# Patient Record
Sex: Male | Born: 1975 | Race: Black or African American | Smoking: Current every day smoker
Health system: Northeastern US, Academic
[De-identification: ages and names within clinical notes are randomized; demographics above are authoritative.]

## PROBLEM LIST (undated history)

## (undated) DIAGNOSIS — K219 Gastro-esophageal reflux disease without esophagitis: Secondary | ICD-10-CM

## (undated) DIAGNOSIS — F32A Depression, unspecified: Secondary | ICD-10-CM

## (undated) DIAGNOSIS — B20 Human immunodeficiency virus [HIV] disease: Secondary | ICD-10-CM

## (undated) DIAGNOSIS — J45909 Unspecified asthma, uncomplicated: Secondary | ICD-10-CM

## (undated) DIAGNOSIS — K257 Chronic gastric ulcer without hemorrhage or perforation: Secondary | ICD-10-CM

## (undated) DIAGNOSIS — H547 Unspecified visual loss: Secondary | ICD-10-CM

## (undated) DIAGNOSIS — Z21 Asymptomatic human immunodeficiency virus [HIV] infection status: Secondary | ICD-10-CM

## (undated) DIAGNOSIS — F1010 Alcohol abuse, uncomplicated: Secondary | ICD-10-CM

## (undated) DIAGNOSIS — L309 Dermatitis, unspecified: Secondary | ICD-10-CM

## (undated) DIAGNOSIS — F419 Anxiety disorder, unspecified: Secondary | ICD-10-CM

## (undated) DIAGNOSIS — G709 Myoneural disorder, unspecified: Secondary | ICD-10-CM

## (undated) HISTORY — DX: Human immunodeficiency virus (HIV) disease: B20

## (undated) HISTORY — DX: Unspecified visual loss: H54.7

## (undated) HISTORY — DX: Depression, unspecified: F32.A

## (undated) HISTORY — DX: Anxiety disorder, unspecified: F41.9

## (undated) HISTORY — PX: FRACTURE SURGERY: SHX138

## (undated) HISTORY — DX: Myoneural disorder, unspecified: G70.9

## (undated) HISTORY — DX: Chronic gastric ulcer without hemorrhage or perforation: K25.7

## (undated) HISTORY — DX: Asymptomatic human immunodeficiency virus (hiv) infection status: Z21

## (undated) HISTORY — PX: OTHER SURGICAL HISTORY: SHX169

---

## 2009-07-26 ENCOUNTER — Ambulatory Visit
Admit: 2009-07-26 | Discharge: 2009-07-26 | Disposition: A | Discharge status: Home / Self Care | Payer: Self-pay | Source: Ambulatory Visit | Attending: Psychiatry | Admitting: Psychiatry

## 2010-03-29 ENCOUNTER — Ambulatory Visit
Admit: 2010-03-29 | Discharge: 2010-03-29 | Disposition: A | Discharge status: Home / Self Care | Payer: Self-pay | Source: Ambulatory Visit | Attending: Internal Medicine | Admitting: Internal Medicine

## 2010-03-29 LAB — CBC
Hematocrit: 46 % (ref 40–51)
Hemoglobin: 15.7 g/dL (ref 13.7–17.5)
MCV: 96 fL — ABNORMAL HIGH (ref 79–92)
Platelets: 201 THOU/uL (ref 150–330)
RBC: 4.8 MIL/uL (ref 4.6–6.1)
RDW: 12.7 % (ref 11.6–14.4)
WBC: 6.3 THOU/uL (ref 4.2–9.1)

## 2010-03-29 LAB — COMPREHENSIVE METABOLIC PANEL
ALT: 74 U/L — ABNORMAL HIGH (ref 0–50)
AST: 104 U/L — ABNORMAL HIGH (ref 0–50)
Albumin: 4.9 g/dL (ref 3.5–5.2)
Alk Phos: 80 U/L (ref 40–130)
Anion Gap: 15 (ref 7–16)
Bilirubin,Total: 0.6 mg/dL (ref 0.0–1.2)
CO2: 24 mmol/L (ref 20–28)
Calcium: 8.8 mg/dL — ABNORMAL LOW (ref 9.0–10.3)
Chloride: 103 mmol/L (ref 96–108)
Creatinine: 0.83 mg/dL (ref 0.67–1.17)
GFR,Black: >59 *
GFR,Caucasian: >59 *
Glucose: 69 mg/dL — ABNORMAL LOW (ref 74–106)
Lab: 6 mg/dL (ref 6–20)
Potassium: 3.9 mmol/L (ref 3.3–5.1)
Sodium: 142 mmol/L (ref 133–145)
Total Protein: 7.4 g/dL (ref 6.3–7.7)

## 2010-03-29 LAB — TSH: TSH: 0.97 u[IU]/mL (ref 0.27–4.20)

## 2010-03-30 LAB — CHLAMYDIA PLASMID DNA AMPLIFICATION: Chlamydia Plasmid DNA Amplification: 0

## 2010-03-30 LAB — HEB B INT

## 2010-03-30 LAB — HEPATITIS B CORE ANTIBODY, TOTAL: HBV Core Ab: NEGATIVE

## 2010-03-30 LAB — HEPATITIS B SURFACE ANTIBODY
HBV S Ab Quant: 0 IU/L
HBV S Ab: NEGATIVE

## 2010-03-30 LAB — RPR: RPR Screen: NONREACTIVE

## 2010-03-30 LAB — DRUG SCREEN CHEMICAL DEPENDENCY, URINE
Amphetamine,UR: NEGATIVE
Benzodiazepinen,UR: NEGATIVE
Cocaine/Metab,UR: NEGATIVE
Opiates,UR: NEGATIVE
THC Metabolite,UR: NEGATIVE

## 2010-03-30 LAB — HEPATITIS B SURFACE ANTIGEN: HBV S Ag: NEGATIVE

## 2010-03-30 LAB — TRICHOMONAS CULTURE

## 2010-03-30 LAB — N. GONORRHOEAE DNA AMPLIFICATION: N. gonorrhoeae DNA Amplification: 0

## 2010-03-30 LAB — HEPATITIS C ANTIBODY: Hep C Ab: NEGATIVE

## 2010-04-02 LAB — VITAMIN D
25-OH VIT D2: <4 ng/mL
25-OH VIT D3: 19 ng/mL
25-OH Vit Total: 19 ng/mL — ABNORMAL LOW (ref 30–80)

## 2011-06-07 ENCOUNTER — Ambulatory Visit
Admit: 2011-06-07 | Discharge: 2011-06-07 | Disposition: A | Discharge status: Home / Self Care | Payer: Self-pay | Source: Ambulatory Visit | Attending: Internal Medicine | Admitting: Internal Medicine

## 2011-06-10 LAB — N. GONORRHOEAE DNA AMPLIFICATION: N. gonorrhoeae DNA Amplification: 0

## 2011-06-10 LAB — CHLAMYDIA PLASMID DNA AMPLIFICATION: Chlamydia Plasmid DNA Amplification: 0

## 2011-08-26 ENCOUNTER — Encounter: Payer: Self-pay | Admitting: Physician Assistant

## 2011-08-26 ENCOUNTER — Inpatient Hospital Stay
Admission: EM | Admit: 2011-08-26 | Disposition: A | Discharge status: Home / Self Care | Payer: Self-pay | Source: Ambulatory Visit | Attending: Internal Medicine | Admitting: Internal Medicine

## 2011-08-26 HISTORY — DX: Dermatitis, unspecified: L30.9

## 2011-08-26 HISTORY — DX: Gastro-esophageal reflux disease without esophagitis: K21.9

## 2011-08-26 HISTORY — DX: Alcohol abuse, uncomplicated: F10.10

## 2011-08-26 HISTORY — DX: Unspecified asthma, uncomplicated: J45.909

## 2011-08-26 LAB — RUQ PANEL (ED ONLY)
ALT: 1023 U/L — ABNORMAL HIGH (ref 0–50)
AST: 1653 U/L — ABNORMAL HIGH (ref 0–50)
Albumin: 4.9 g/dL (ref 3.5–5.2)
Alk Phos: 81 U/L (ref 40–130)
Amylase: 87 U/L (ref 28–100)
Bilirubin,Direct: 0.5 mg/dL — ABNORMAL HIGH (ref 0.0–0.3)
Bilirubin,Total: 0.9 mg/dL (ref 0.0–1.2)
Lipase: 52 U/L (ref 13–60)
Total Protein: 8.1 g/dL — ABNORMAL HIGH (ref 6.3–7.7)

## 2011-08-26 LAB — ACETAMINOPHEN LEVEL: Acetaminophen: <2 ug/mL

## 2011-08-26 LAB — CBC AND DIFFERENTIAL
Baso # K/uL: 0 10*3/uL (ref 0.0–0.1)
Basophil %: 0.6 % (ref 0.2–1.2)
Eos # K/uL: 0 10*3/uL (ref 0.0–0.5)
Eosinophil %: 0 % — ABNORMAL LOW (ref 0.8–7.0)
Hematocrit: 51 % (ref 40–51)
Hemoglobin: 18.1 g/dL — ABNORMAL HIGH (ref 13.7–17.5)
Lymph # K/uL: 0.6 10*3/uL — ABNORMAL LOW (ref 1.3–3.6)
Lymphocyte %: 16.6 % — ABNORMAL LOW (ref 21.8–53.1)
MCV: 90 fL (ref 79–92)
Mono # K/uL: 0.1 10*3/uL — ABNORMAL LOW (ref 0.3–0.8)
Monocyte %: 4 % — ABNORMAL LOW (ref 5.3–12.2)
Neut # K/uL: 2.8 10*3/uL (ref 1.8–5.4)
Platelets: 80 10*3/uL — ABNORMAL LOW (ref 150–330)
RBC: 5.6 MIL/uL (ref 4.6–6.1)
RDW: 12.8 % (ref 11.6–14.4)
Seg Neut %: 78.8 % — ABNORMAL HIGH (ref 34.0–67.9)
WBC: 3.5 10*3/uL — ABNORMAL LOW (ref 4.2–9.1)

## 2011-08-26 LAB — URINALYSIS WITH MICROSCOPIC
Hyaline Casts,UA: 71 /lpf — ABNORMAL HIGH (ref 0–2)
Leuk Esterase,UA: NEGATIVE
Nitrite,UA: NEGATIVE
Protein,UA: 100 mg/dL — AB
RBC,UA: NONE SEEN /hpf (ref 0–2)
Specific Gravity,UA: 1.026 (ref 1.002–1.030)
WBC,UA: 2 /hpf (ref 0–5)
pH,UA: 5 (ref 5.0–8.0)

## 2011-08-26 LAB — BASIC METABOLIC PANEL
Anion Gap: 20 — ABNORMAL HIGH (ref 7–16)
CO2: 17 mmol/L — ABNORMAL LOW (ref 20–28)
Calcium: 9.1 mg/dL (ref 9.0–10.3)
Chloride: 97 mmol/L (ref 96–108)
Creatinine: 1.42 mg/dL — ABNORMAL HIGH (ref 0.67–1.17)
GFR,Black: >59 *
GFR,Caucasian: 56 * — AB
Glucose: 93 mg/dL (ref 60–99)
Lab: 18 mg/dL (ref 6–20)
Potassium: 3.2 mmol/L — ABNORMAL LOW (ref 3.3–5.1)
Sodium: 134 mmol/L (ref 133–145)

## 2011-08-26 LAB — HIV-1/2 RAPID SCREEN AB - MEDICALLY URGENT: Rapid HIV 1&2: NEGATIVE

## 2011-08-26 LAB — PROTIME-INR
INR: 1.1 (ref 1.0–1.2)
Protime: 11.2 s (ref 9.2–12.3)

## 2011-08-26 LAB — FECAL LACTOFERRIN ANTIGEN: Fecal lactoferrin antigen: POSITIVE

## 2011-08-26 LAB — TSH: TSH: 2.09 u[IU]/mL (ref 0.27–4.20)

## 2011-08-26 LAB — HM HIV SCREENING OFFERED

## 2011-08-26 MED ORDER — DIAZEPAM 10 MG PO TABS *I*
10.0000 mg | ORAL_TABLET | ORAL | Status: DC | PRN
Start: 2011-08-26 — End: 2011-08-27

## 2011-08-26 MED ORDER — SODIUM CHLORIDE 0.9 % IV SOLN WRAPPED *I*
125.0000 mL/h | Status: DC
Start: 2011-08-26 — End: 2011-08-26
  Administered 2011-08-26 (×2): 125 mL/h via INTRAVENOUS

## 2011-08-26 MED ORDER — LOPERAMIDE HCL 2 MG PO CAPS *I*
2.0000 mg | ORAL_CAPSULE | Freq: Once | ORAL | Status: DC
Start: 2011-08-26 — End: 2011-08-26

## 2011-08-26 MED ORDER — NICOTINE POLACRILEX 2 MG MT GUM *I*
2.0000 mg | CHEWING_GUM | OROMUCOSAL | Status: DC | PRN
Start: 2011-08-26 — End: 2011-08-27
  Administered 2011-08-26: 2 mg via ORAL
  Filled 2011-08-26 (×2): qty 1

## 2011-08-26 MED ORDER — DIAZEPAM 10 MG PO TABS *I*
20.0000 mg | ORAL_TABLET | ORAL | Status: DC | PRN
Start: 2011-08-26 — End: 2011-08-26

## 2011-08-26 MED ORDER — FOLIC ACID 1 MG PO TABS *I*
1.0000 mg | ORAL_TABLET | Freq: Every day | ORAL | Status: DC
Start: 2011-08-26 — End: 2011-08-27
  Administered 2011-08-26 – 2011-08-27 (×2): 1 mg via ORAL
  Filled 2011-08-26 (×2): qty 1

## 2011-08-26 MED ORDER — THIAMINE HCL 100 MG PO TABS *WRAPPED*
100.0000 mg | ORAL_TABLET | Freq: Every day | ORAL | Status: DC
Start: 2011-08-26 — End: 2011-08-27
  Administered 2011-08-26 – 2011-08-27 (×2): 100 mg via ORAL
  Filled 2011-08-26 (×2): qty 1

## 2011-08-26 MED ORDER — ACETAMINOPHEN 325 MG PO TABS *I*
650.0000 mg | ORAL_TABLET | Freq: Four times a day (QID) | ORAL | Status: DC | PRN
Start: 2011-08-26 — End: 2011-08-27
  Administered 2011-08-27: 650 mg via ORAL
  Filled 2011-08-26: qty 2

## 2011-08-26 MED ORDER — NICOTINE 7 MG/24HR TD PT24 *I*
1.0000 | MEDICATED_PATCH | Freq: Every day | TRANSDERMAL | Status: DC
Start: 2011-08-26 — End: 2011-08-26

## 2011-08-26 MED ORDER — ONDANSETRON HCL 2 MG/ML IV SOLN *I*
4.0000 mg | Freq: Once | INTRAMUSCULAR | Status: AC
Start: 2011-08-26 — End: 2011-08-26
  Administered 2011-08-26: 4 mg via INTRAVENOUS
  Filled 2011-08-26: qty 2

## 2011-08-26 MED ORDER — NICOTINE PATCH REMOVAL *I*
Freq: Every day | Status: DC
Start: 2011-08-27 — End: 2011-08-26
  Filled 2011-08-26: qty 1

## 2011-08-26 MED ORDER — POTASSIUM BICARBONATE 25 MEQ PO TBEF *I*
50.0000 meq | EFFERVESCENT_TABLET | Freq: Once | ORAL | Status: AC
Start: 2011-08-26 — End: 2011-08-26
  Administered 2011-08-26: 50 meq via ORAL
  Filled 2011-08-26: qty 2

## 2011-08-26 MED ORDER — SODIUM CHLORIDE 0.9 % IV BOLUS *I*
1000.0000 mL | Freq: Once | Status: AC
Start: 2011-08-26 — End: 2011-08-26
  Administered 2011-08-26: 1000 mL via INTRAVENOUS

## 2011-08-26 MED ORDER — ONDANSETRON HCL 2 MG/ML IV SOLN *I*
4.0000 mg | Freq: Four times a day (QID) | INTRAMUSCULAR | Status: DC | PRN
Start: 2011-08-26 — End: 2011-08-27

## 2011-08-26 MED ORDER — LORAZEPAM 1 MG PO TABS *I*
1.0000 mg | ORAL_TABLET | Freq: Four times a day (QID) | ORAL | Status: AC | PRN
Start: 2011-08-26 — End: 2011-08-27
  Administered 2011-08-26: 1 mg via ORAL
  Filled 2011-08-26: qty 2
  Filled 2011-08-26: qty 1

## 2011-08-26 MED ORDER — MULTIVITAMINS PO TABS *A*
1.0000 | ORAL_TABLET | Freq: Every day | ORAL | Status: DC
Start: 2011-08-26 — End: 2011-08-27
  Administered 2011-08-26 – 2011-08-27 (×2): 1 via ORAL
  Filled 2011-08-26 (×2): qty 1

## 2011-08-26 MED ORDER — PANTOPRAZOLE SODIUM 40 MG IV SOLR *I*
40.0000 mg | Freq: Once | INTRAVENOUS | Status: AC
Start: 2011-08-26 — End: 2011-08-26
  Administered 2011-08-26: 40 mg via INTRAVENOUS
  Filled 2011-08-26: qty 10

## 2011-08-27 DIAGNOSIS — R7401 Elevation of levels of liver transaminase levels: Secondary | ICD-10-CM

## 2011-08-27 LAB — CREATININE, URINE: Creatinine,UR: 307 mg/dL — ABNORMAL HIGH (ref 20–300)

## 2011-08-27 LAB — CBC AND DIFFERENTIAL
Baso # K/uL: 0 10*3/uL (ref 0.0–0.1)
Basophil %: 0.7 % (ref 0.2–1.2)
Eos # K/uL: 0 10*3/uL (ref 0.0–0.5)
Eosinophil %: 0 % — ABNORMAL LOW (ref 0.8–7.0)
Hematocrit: 45 % (ref 40–51)
Hemoglobin: 16.3 g/dL (ref 13.7–17.5)
Lymph # K/uL: 0.8 10*3/uL — ABNORMAL LOW (ref 1.3–3.6)
Lymphocyte %: 29 % (ref 21.8–53.1)
MCV: 89 fL (ref 79–92)
Mono # K/uL: 0.1 10*3/uL — ABNORMAL LOW (ref 0.3–0.8)
Monocyte %: 5.1 % — ABNORMAL LOW (ref 5.3–12.2)
Neut # K/uL: 1.8 10*3/uL (ref 1.8–5.4)
Platelets: 64 10*3/uL — ABNORMAL LOW (ref 150–330)
RBC: 5.1 MIL/uL (ref 4.6–6.1)
RDW: 12.6 % (ref 11.6–14.4)
Seg Neut %: 65.2 % (ref 34.0–67.9)
WBC: 2.8 10*3/uL — ABNORMAL LOW (ref 4.2–9.1)

## 2011-08-27 LAB — COMPREHENSIVE METABOLIC PANEL
ALT: 670 U/L — ABNORMAL HIGH (ref 0–50)
AST: 900 U/L — ABNORMAL HIGH (ref 0–50)
Albumin: 4 g/dL (ref 3.5–5.2)
Alk Phos: 62 U/L (ref 40–130)
Anion Gap: 14 (ref 7–16)
Bilirubin,Total: 0.8 mg/dL (ref 0.0–1.2)
CO2: 19 mmol/L — ABNORMAL LOW (ref 20–28)
Calcium: 8.7 mg/dL — ABNORMAL LOW (ref 9.0–10.3)
Chloride: 99 mmol/L (ref 96–108)
Creatinine: 1.01 mg/dL (ref 0.67–1.17)
GFR,Black: >59 *
GFR,Caucasian: >59 *
Glucose: 95 mg/dL (ref 60–99)
Lab: 13 mg/dL (ref 6–20)
Potassium: 3.1 mmol/L — ABNORMAL LOW (ref 3.3–5.1)
Sodium: 132 mmol/L — ABNORMAL LOW (ref 133–145)
Total Protein: 6.6 g/dL (ref 6.3–7.7)

## 2011-08-27 LAB — BILIRUBIN, DIRECT: Bilirubin,Direct: 0.5 mg/dL — ABNORMAL HIGH (ref 0.0–0.3)

## 2011-08-27 LAB — HEPATITIS A,B,C PROF
HBV Core Ab: NEGATIVE
HBV S Ab Quant: 0.9 IU/L
HBV S Ab: NEGATIVE
HBV S Ag: NEGATIVE
Hep A Total Ab: NEGATIVE
Hep C Ab: NEGATIVE

## 2011-08-27 LAB — SODIUM, URINE: Sodium,UR: <10 mmol/L

## 2011-08-27 LAB — CHLAMYDIA PLASMID DNA AMPLIFICATION: Chlamydia Plasmid DNA Amplification: 0

## 2011-08-27 LAB — CLOSTRIDIUM DIFFICILE EIA: C difficile Toxins A and B EIA: 0

## 2011-08-27 LAB — N. GONORRHOEAE DNA AMPLIFICATION: N. gonorrhoeae DNA Amplification: 0

## 2011-08-27 MED ORDER — THIAMINE HCL 100 MG PO TABS *WRAPPED*
100.0000 mg | ORAL_TABLET | Freq: Every day | ORAL | Status: AC
Start: 2011-08-27 — End: 2012-02-23

## 2011-08-27 MED ORDER — POTASSIUM CHLORIDE CRYS CR 20 MEQ PO TBCR *I*
20.0000 meq | ORAL_TABLET | ORAL | Status: AC
Start: 2011-08-27 — End: 2011-08-27
  Administered 2011-08-27 (×3): 20 meq via ORAL
  Filled 2011-08-27 (×3): qty 1

## 2011-08-27 MED ORDER — NICOTINE POLACRILEX 2 MG MT GUM *I*
2.0000 mg | CHEWING_GUM | OROMUCOSAL | Status: DC | PRN
Start: 2011-08-27 — End: 2012-06-28

## 2011-08-27 MED ORDER — NICOTINE PATCH REMOVAL *I*
Freq: Every day | Status: DC
Start: 2011-08-28 — End: 2011-08-27
  Filled 2011-08-27: qty 1

## 2011-08-27 MED ORDER — PANTOPRAZOLE SODIUM 40 MG PO TBEC *I*
40.0000 mg | DELAYED_RELEASE_TABLET | Freq: Every morning | ORAL | Status: DC
Start: 2011-08-27 — End: 2011-08-27
  Administered 2011-08-27: 40 mg via ORAL
  Filled 2011-08-27: qty 1

## 2011-08-27 MED ORDER — MOMETASONE FUROATE 220 MCG/INH IN AEPB INHALER *I*
1.0000 | INHALATION_SPRAY | Freq: Every evening | RESPIRATORY_TRACT | Status: DC
Start: 2011-08-27 — End: 2011-08-27
  Filled 2011-08-27: qty 1

## 2011-08-27 MED ORDER — PANTOPRAZOLE SODIUM 40 MG PO TBEC *I*
40.0000 mg | DELAYED_RELEASE_TABLET | Freq: Every morning | ORAL | Status: DC
Start: 2011-08-27 — End: 2012-02-23

## 2011-08-27 MED ORDER — FOLIC ACID 1 MG PO TABS *I*
1.0000 mg | ORAL_TABLET | Freq: Every day | ORAL | Status: AC
Start: 2011-08-27 — End: 2012-02-23

## 2011-08-27 MED ORDER — MULTIVITAMINS PO TABS *A*
1.0000 | ORAL_TABLET | Freq: Every day | ORAL | Status: AC
Start: 2011-08-27 — End: 2012-02-23

## 2011-08-27 MED ORDER — NICOTINE 14 MG/24HR TD PT24 *I*
1.0000 | MEDICATED_PATCH | Freq: Every day | TRANSDERMAL | Status: DC
Start: 2011-08-27 — End: 2011-08-27
  Filled 2011-08-27: qty 1

## 2011-08-27 MED ORDER — ALBUTEROL SULFATE HFA 108 (90 BASE) MCG/ACT IN AERS *I*
2.0000 | INHALATION_SPRAY | Freq: Four times a day (QID) | RESPIRATORY_TRACT | Status: DC | PRN
Start: 2011-08-27 — End: 2011-08-27
  Filled 2011-08-27: qty 18

## 2011-08-27 NOTE — Discharge Instructions (Addendum)
Active Hospital Problems   Diagnoses   . Transaminitis   . Abdominal pain   . Diarrhea      Resolved Hospital Problems   Diagnoses     Brief Summary of Your Hospital Course (including key procedures and diagnostic test results):  You came to hospital after having nausea, vomiting and abdominal pain for the past three days.  You also reported that you have been drinking 1 pint of liquor for the past three months.  When you arrived at the hospital lab work was completed which showed that you had labs which showed liver damage and acute injury to your kidneys.  Your kidney injury was most likely due to dehydration because you were given IV fluids and your kidney function returned back to normal.   Your elevated liver enzymes is most likely due to your chronic alcohol use.  The liver enzymes did improve with IV fluids but were still above baseline. You should avoid drinking alcohol to prevent further damage to your liver.  You were told about the many programs in the area which can help you with alcohol cessation.  The nausea, vomiting and diarrhea that you experience were most likely due to a viral illness.  Your stool was negative for C. Diff which is a bacteria that can cause diarrhea. Your symptoms improved with IV fluids.  You were able to tolerate a diet and drink fluids so it was safe for you to return home.  You were set up with a new primary care doctor and should go to that appointment on Friday.  You were also started on protonix for your chronic GERD.  You also had slightly abnormal blood counts which are likely also caused by heavy alcohol use, your primary care doctor should continue to follow you for these lab results.  Please go get your blood drawn on Friday morning before your primary care doctor appointment.     Your instructions:  - drink a lot of fluids with electrolytes (gatorade is best) especially if your diarrhea continues, it is important to stay well hydrated  - go to your new primary care  doctor appointment on Friday  - get your blood drawn at [facility] lab on Friday morning before this appointment, the lab requisition has already been sent to the lab, you just need to bring your license or another form of photo ID  - stop drinking alcohol to avoid further liver damage; if you start to develop rapid heart rate, sweatiness or tremors, this is a sign of alcohol withdrawal and you may need to return to the hospital for management of these symptoms    - also work on smoking cessation, use the nicotine lozenges for your craving  - start taking Protonix 40 mg once daily for GERD  - continue taking your asthma medications  - start taking vitamins: folic acid, thiamine and multivitamin     Recommended diet: regular diet    Recommended activity: activity as tolerated    Wound Care: none needed    If you experience any of these symptoms within the first 24 hours after discharge:Pain, Shortness of breath, Fever of 101 F. or greater or Chills  please follow up with the discharge attending Dr. Christella Hartigan at phone-number: [phone]    If you experience any of these symptoms 24 hours or more after discharge:Pain, Shortness of breath or Fever of 101 F. or greater please follow up with your PCP:  You will see your new PCP on Friday, [phone]

## 2011-08-28 LAB — SHIGA TOXIN: Shiga Toxin: 0

## 2011-08-28 LAB — AEROBIC CULTURE: Aerobic Culture: 0

## 2011-08-30 ENCOUNTER — Ambulatory Visit: Payer: Self-pay

## 2011-10-29 NOTE — ED Notes (Signed)
MCT Note: Phone contact with client who agreed to a visit today between 3:30-4:00.

## 2011-10-29 NOTE — ED Notes (Signed)
Mobile Crisis Team Note: Team arrives for scheduled appt. Client comes to the door and Clinical research associate can smell marijuana. His eyes are glazed and red. Client states he thought team visit was supposed to be for tomorrow at this time. He reports he has been drinking and smoking marijuana. Agrees to stay sober and to team visit tomorrow at 3:30pm.

## 2011-10-30 ENCOUNTER — Ambulatory Visit
Admit: 2011-10-30 | Discharge: 2011-11-14 | Disposition: A | Discharge status: Home / Self Care | Payer: Self-pay | Source: Ambulatory Visit | Attending: Psychiatry | Admitting: Psychiatry

## 2011-11-01 ENCOUNTER — Ambulatory Visit: Payer: Self-pay
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
## 2011-11-01 NOTE — ED Notes (Addendum)
[place] Mobile Crisis Team note:    Derek [facility] from [facility] called to let the team know that Derek Walker was a no-show for his appointment today.  When he called him, a woman answered and he was informed that they knew he had a mental health appointment.  Patient wasn't home and she didn't know where the patient was.  Per Evans Lance, NP we will go out and do a cold call on this patient

## 2011-11-01 NOTE — ED Notes (Signed)
MCT Note:  Attempted visit to client and was informed that client was not at home and had stayed the night with his girlfriend.  Client's aunt spoke with him today and he reported he was on his way home.  Client's mother was given a brochure requesting client to call back to the team.

## 2011-11-01 NOTE — Progress Notes (Signed)
[facility] MISSED/CANCELLED APPOINTMENT     Name: Derek Walker  MRN: [id number]   DOB: Jan 06, 1976    Date of Scheduled Service: 11/01/2011      Mr. Frankl was a no show for today's appointment.  I attempted to reach him by telephone. The woman who answered the phone at his residence stated that he was not available.  I carefully inquired, so as not to disclose confidential information, if she knew his whereabouts.  She indicated that she knew he had a mental health appointment scheduled this morning and did not know why he did not keep it.  I gave her information and the telephone number on how to reschedule an intake evaluation in [facility] if the patient is still in need of this level of care.  She said that she would give it to him as soon as she sees him again.  I also notified the mobile crisis team, who referred the patient, the patient did not come to the evaluation. No intake evaluation was completed on the scheduled date.    Additional Information:    Not applicable    Kiandra Sanguinetti Calton Dach, MS.Ed., [facility]

## 2011-11-02 NOTE — ED Notes (Signed)
Mobile Crisis Team Note: t/c to client - spoke w/mom who gives writer client's girlfriend ([name]) phone number [phone] and would like team to call him there b/c she is concerned about him as well. Writer calls this number and leaves message requesting client to call team.

## 2011-11-03 NOTE — ED Notes (Signed)
T/C to both numbers [phone] and [phone]. No response, left message requesting a call back to the team to schedule visit.

## 2011-11-04 NOTE — ED Notes (Signed)
T/C to pt's home.  Pt reports he attempted to keep his appt at [facility] but got lost and could not find the building.  Pt requested contact info for [facility], which Clinical research associate provided.  Pt advised to call MCT back if he cannot reach [facility].  Case pending.

## 2011-11-05 NOTE — ED Notes (Signed)
RCMCT Note: Writer telephoned [phone] and spoke to Mr. Joyner's mom who informed Writer that he was not home but at his girlfriends and provided her telephone number of [phone]. Writer left a message for [employer] Mr. Haefner's girlfriend to have him give the Team a call and crisis team number left.

## 2011-11-05 NOTE — ED Notes (Signed)
R[facility]CT Note: Team attempted another visit to [name] with no success. Brochure left. Writer consulted with Co-worker [place] and Alda Lea case to be closed at this time.

## 2011-11-15 ENCOUNTER — Ambulatory Visit
Admit: 2011-11-15 | Discharge: 2011-12-15 | Disposition: A | Discharge status: Home / Self Care | Payer: Self-pay | Source: Ambulatory Visit | Attending: Psychiatry | Admitting: Psychiatry

## 2012-06-26 ENCOUNTER — Ambulatory Visit
Admit: 2012-06-26 | Discharge: 2012-07-16 | Disposition: A | Discharge status: Home / Self Care | Payer: Self-pay | Source: Ambulatory Visit | Attending: Psychiatry | Admitting: Psychiatry

## 2012-06-26 NOTE — ED Notes (Signed)
MCT Note:  I made phone contact with client who stated that he is feeling "very, very, depressed."  He has been incarcerated 3xs, most recent release was 14 mths ago and he is now residing with his mother.  He expressed that their relationship is strained and they have never been able to talk with one another.  He states he has been in and out of psych hospitalization 5-6 xs.  He also states that he was seen by MCT about 2 mths ago although and he was linked to [facility] although he admits to having poor follow through and states that he stopped attending after 1x.  He agreed to be seen by MCT today 06/26/12 @ 15:30
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
## 2012-06-29 NOTE — ED Notes (Signed)
MCT Note:  This Clinical research associate followed-up with client in order to inquire whether or not he continued to want an ALR placement.  Client was adamant that he did and explained that he had difficulty obtaining a PPD.  I scheduled a home visit with him today between 13:30-14:00, informing him that we'd pick him up and take him to [facility] in order to obtain his PPD, then take him to ALR.  I called [facility] and spoke to [facility], informing her that we'd be bringing client around 14:30.

## 2012-06-30 NOTE — ED Notes (Signed)
MCT Note:  I attempted to make phone contact with client on the phone # that he provided.  At the time of my call he was not home, therefore I informed his mother that there is no longer a placement available at ALR.  She stated that she feels as though he is "playing games."  She states she has not seen him since yesterday when he and his girlfriend left her house.  I also asked her to remind him that he has an appointment at [facility] today.  I explained to her that he can call MCT daily and to inquire on whether or not there is an available bed.  This case is closed at this time.

## 2012-07-17 ENCOUNTER — Ambulatory Visit
Admit: 2012-07-17 | Discharge: 2012-08-15 | Disposition: A | Discharge status: Home / Self Care | Payer: Self-pay | Source: Ambulatory Visit | Attending: Psychiatry | Admitting: Psychiatry

## 2012-07-27 ENCOUNTER — Ambulatory Visit
Admit: 2012-07-27 | Discharge: 2012-07-27 | Disposition: A | Discharge status: Home / Self Care | Payer: Self-pay | Source: Ambulatory Visit | Attending: Internal Medicine | Admitting: Internal Medicine

## 2012-07-27 ENCOUNTER — Ambulatory Visit
Admit: 2012-07-27 | Discharge: 2012-07-27 | Disposition: A | Discharge status: Home / Self Care | Payer: Self-pay | Source: Ambulatory Visit | Attending: Physician Assistant | Admitting: Physician Assistant

## 2012-07-27 ENCOUNTER — Other Ambulatory Visit: Payer: Self-pay | Admitting: Physician Assistant

## 2012-07-27 ENCOUNTER — Encounter: Payer: Self-pay | Admitting: Gastroenterology

## 2012-07-27 DIAGNOSIS — M79609 Pain in unspecified limb: Secondary | ICD-10-CM

## 2012-07-27 LAB — COMPREHENSIVE METABOLIC PANEL
ALT: 80 U/L — ABNORMAL HIGH (ref 0–50)
AST: 102 U/L — ABNORMAL HIGH (ref 0–50)
Albumin: 4.3 g/dL (ref 3.5–5.2)
Alk Phos: 85 U/L (ref 40–130)
Anion Gap: 14 (ref 7–16)
Bilirubin,Total: 0.4 mg/dL (ref 0.0–1.2)
CO2: 25 mmol/L (ref 20–28)
Calcium: 9 mg/dL (ref 9.0–10.3)
Chloride: 103 mmol/L (ref 96–108)
Creatinine: 0.86 mg/dL (ref 0.67–1.17)
GFR,Black: 128 *
GFR,Caucasian: 111 *
Glucose: 59 mg/dL — ABNORMAL LOW (ref 60–99)
Lab: 6 mg/dL (ref 6–20)
Potassium: 3.6 mmol/L (ref 3.3–5.1)
Sodium: 142 mmol/L (ref 133–145)
Total Protein: 7.8 g/dL — ABNORMAL HIGH (ref 6.3–7.7)

## 2012-07-27 LAB — CBC AND DIFFERENTIAL
Baso # K/uL: 0 10*3/uL (ref 0.0–0.1)
Basophil %: 0.4 % (ref 0.2–1.2)
Eos # K/uL: 0.1 10*3/uL (ref 0.0–0.5)
Eosinophil %: 2 % (ref 0.8–7.0)
Hematocrit: 40 % (ref 40–51)
Hemoglobin: 13.6 g/dL — ABNORMAL LOW (ref 13.7–17.5)
Lymph # K/uL: 1.3 10*3/uL (ref 1.3–3.6)
Lymphocyte %: 25.8 % (ref 21.8–53.1)
MCV: 95 fL — ABNORMAL HIGH (ref 79–92)
Mono # K/uL: 0.8 10*3/uL (ref 0.3–0.8)
Monocyte %: 16.6 % — ABNORMAL HIGH (ref 5.3–12.2)
Neut # K/uL: 2.7 10*3/uL (ref 1.8–5.4)
Platelets: 146 10*3/uL — ABNORMAL LOW (ref 150–330)
RBC: 4.2 MIL/uL — ABNORMAL LOW (ref 4.6–6.1)
RDW: 12.6 % (ref 11.6–14.4)
Seg Neut %: 55.2 % (ref 34.0–67.9)
WBC: 4.9 10*3/uL (ref 4.2–9.1)

## 2012-07-27 LAB — BILIRUBIN, DIRECT: Bilirubin,Direct: <0.2 mg/dL (ref 0.0–0.3)

## 2012-07-27 LAB — TSH: TSH: 1.02 u[IU]/mL (ref 0.27–4.20)

## 2012-07-27 LAB — GGT: GGT: 338 U/L — ABNORMAL HIGH (ref 8–61)

## 2012-07-28 LAB — HEPATITIS B & C PROF
HBV Core Ab: NEGATIVE
HBV S Ab Quant: 0.55 m[IU]/mL
HBV S Ab: NEGATIVE
HBV S Ag: NEGATIVE
Hep C Ab: NEGATIVE

## 2012-07-28 LAB — CHLAMYDIA PLASMID DNA AMPLIFICATION: Chlamydia Plasmid DNA Amplification: 0

## 2012-07-28 LAB — HIV AB CONFIRM (MULTISPOT): HIV AB Confirm: POSITIVE

## 2012-07-28 LAB — HEPATITIS A IGG AB: Hepatitis A IGG: NEGATIVE

## 2012-07-28 LAB — HEMOGLOBIN A1C: Hemoglobin A1C: 5.2 % (ref 4.0–6.0)

## 2012-07-28 LAB — HIV 1&2 ANTIGEN/ANTIBODY: HIV 1&2 ANTIGEN/ANTIBODY: REACTIVE

## 2012-07-28 LAB — N. GONORRHOEAE DNA AMPLIFICATION: N. gonorrhoeae DNA Amplification: 0

## 2012-07-30 LAB — VITAMIN D
25-OH VIT D2: <4 ng/mL
25-OH VIT D3: 9 ng/mL
25-OH Vit Total: 9 ng/mL — ABNORMAL LOW (ref 30–60)

## 2012-08-16 ENCOUNTER — Ambulatory Visit
Admit: 2012-08-16 | Discharge: 2012-09-15 | Disposition: A | Discharge status: Home / Self Care | Payer: Self-pay | Source: Ambulatory Visit | Attending: Psychiatry | Admitting: Psychiatry

## 2012-08-25 ENCOUNTER — Encounter: Payer: Self-pay | Admitting: Gastroenterology

## 2012-09-01 ENCOUNTER — Ambulatory Visit
Admit: 2012-09-01 | Discharge: 2012-09-01 | Disposition: A | Discharge status: Home / Self Care | Payer: Self-pay | Source: Ambulatory Visit | Attending: Adult Health | Admitting: Adult Health

## 2012-09-01 ENCOUNTER — Encounter: Payer: Self-pay | Admitting: Gastroenterology

## 2012-09-01 LAB — LIPID PANEL
Chol/HDL Ratio: 2.1
Cholesterol: 189 mg/dL
HDL: 90 mg/dL
LDL Calculated: 88 mg/dL
Non HDL Cholesterol: 99 mg/dL
Triglycerides: 54 mg/dL

## 2012-09-02 LAB — SYPHILIS SCREEN
Syphilis Screen: NEGATIVE
Syphilis Status: NONREACTIVE

## 2012-09-02 LAB — LYMPHOCYTE SUBSET (T & B CELLS)
B LYM #(CD19): 78 cells/uL — ABNORMAL LOW (ref 120–725)
B LYM %(CD19): 5 % — ABNORMAL LOW (ref 7–36)
CD4#: 104 cells/uL — ABNORMAL LOW (ref 496–2186)
CD4%: 7 % — ABNORMAL LOW (ref 32–71)
CD4/CD8: 0.1 — ABNORMAL LOW (ref 0.7–3.0)
CD8#: 808 cells/uL (ref 177–1137)
NK LYM #(CD16+56): 391 cells/uL (ref 37–758)
NK LYM %(CD16+56): 26 % (ref 4–26)
T LYM #(CD3): 1032 cells/uL (ref 754–2810)
T LYM %(CD3): 68 % (ref 54–87)
T Suppress %(CD8): 53 % — ABNORMAL HIGH (ref 10–38)

## 2012-09-02 LAB — G6PD PROFILE: G6PD Screen: NORMAL

## 2012-09-02 LAB — TOXOPLASMA GONDII ANTIBODY, IGG: Toxoplasma IgG: NEGATIVE

## 2012-09-02 LAB — MEASLES IGG AB: Measles IgG: UNDETERMINED

## 2012-09-03 LAB — REF RESOLUTION

## 2012-09-03 LAB — VARICELLA ZOSTER IGG AB: VZV IgG: NEGATIVE

## 2012-09-03 LAB — RUBELLA ANTIBODY, IGG: Rubella IgG AB: POSITIVE

## 2012-09-03 LAB — HIV VIRAL LOAD
HIV-1 PCR log: 4.6 log copies,mL
HIV-1 PCR: 36867 copy/mL

## 2012-09-27 LAB — HIV PHENOSENSE GT

## 2012-10-02 ENCOUNTER — Ambulatory Visit
Admit: 2012-10-02 | Discharge: 2012-10-02 | Disposition: A | Discharge status: Home / Self Care | Payer: Self-pay | Source: Ambulatory Visit | Attending: Infectious Diseases | Admitting: Infectious Diseases

## 2012-10-02 ENCOUNTER — Other Ambulatory Visit: Payer: Self-pay | Admitting: Gastroenterology

## 2012-10-02 DIAGNOSIS — R52 Pain, unspecified: Secondary | ICD-10-CM

## 2012-12-04 ENCOUNTER — Ambulatory Visit
Admit: 2012-12-04 | Discharge: 2012-12-04 | Disposition: A | Discharge status: Home / Self Care | Payer: Self-pay | Source: Ambulatory Visit | Attending: Infectious Diseases | Admitting: Infectious Diseases

## 2012-12-04 LAB — COMPREHENSIVE METABOLIC PANEL
ALT: 104 U/L — ABNORMAL HIGH (ref 0–50)
AST: 150 U/L — ABNORMAL HIGH (ref 0–50)
Albumin: 4.2 g/dL (ref 3.5–5.2)
Alk Phos: 106 U/L (ref 40–130)
Anion Gap: 15 (ref 7–16)
Bilirubin,Total: 0.8 mg/dL (ref 0.0–1.2)
CO2: 24 mmol/L (ref 20–28)
Calcium: 8.9 mg/dL — ABNORMAL LOW (ref 9.0–10.3)
Chloride: 100 mmol/L (ref 96–108)
Creatinine: 0.83 mg/dL (ref 0.67–1.17)
GFR,Black: 130 *
GFR,Caucasian: 112 *
Glucose: 80 mg/dL (ref 60–99)
Lab: 5 mg/dL — ABNORMAL LOW (ref 6–20)
Potassium: 3.5 mmol/L (ref 3.3–5.1)
Sodium: 139 mmol/L (ref 133–145)
Total Protein: 7.1 g/dL (ref 6.3–7.7)

## 2012-12-04 LAB — CBC AND DIFFERENTIAL
Baso # K/uL: 0 10*3/uL (ref 0.0–0.1)
Basophil %: 0.3 % (ref 0.2–1.2)
Eos # K/uL: 0.2 10*3/uL (ref 0.0–0.5)
Eosinophil %: 3 % (ref 0.8–7.0)
Hematocrit: 39 % — ABNORMAL LOW (ref 40–51)
Hemoglobin: 13.4 g/dL — ABNORMAL LOW (ref 13.7–17.5)
Lymph # K/uL: 0.9 10*3/uL — ABNORMAL LOW (ref 1.3–3.6)
Lymphocyte %: 14.5 % — ABNORMAL LOW (ref 21.8–53.1)
MCV: 96 fL — ABNORMAL HIGH (ref 79–92)
Mono # K/uL: 0.7 10*3/uL (ref 0.3–0.8)
Monocyte %: 11.7 % (ref 5.3–12.2)
Neut # K/uL: 4.2 10*3/uL (ref 1.8–5.4)
Platelets: 141 10*3/uL — ABNORMAL LOW (ref 150–330)
RBC: 4 MIL/uL — ABNORMAL LOW (ref 4.6–6.1)
RDW: 12.4 % (ref 11.6–14.4)
Seg Neut %: 70.5 % — ABNORMAL HIGH (ref 34.0–67.9)
WBC: 5.9 10*3/uL (ref 4.2–9.1)

## 2012-12-05 LAB — LYMPHOCYTE SUBSET (T & B CELLS)
B LYM #(CD19): 70 cells/uL — ABNORMAL LOW (ref 120–725)
B LYM %(CD19): 8 % (ref 7–36)
CD4#: 49 cells/uL — ABNORMAL LOW (ref 496–2186)
CD4%: 6 % — ABNORMAL LOW (ref 32–71)
CD4/CD8: 0.1 — ABNORMAL LOW (ref 0.7–3.0)
CD8#: 511 cells/uL (ref 177–1137)
NK LYM #(CD16+56): 145 cells/uL (ref 37–758)
NK LYM %(CD16+56): 17 % (ref 4–26)
T LYM #(CD3): 605 cells/uL — ABNORMAL LOW (ref 754–2810)
T LYM %(CD3): 73 % (ref 54–87)
T Suppress %(CD8): 62 % — ABNORMAL HIGH (ref 10–38)

## 2012-12-08 LAB — HIV VIRAL LOAD: HIV-1 PCR: NEGATIVE copy/mL

## 2013-03-11 ENCOUNTER — Ambulatory Visit
Admit: 2013-03-11 | Discharge: 2013-03-15 | Disposition: A | Discharge status: Home / Self Care | Payer: Self-pay | Source: Ambulatory Visit | Attending: Psychiatry | Admitting: Psychiatry
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
## 2013-03-11 NOTE — ED Notes (Signed)
MCT note  LL transfers the call from CLT as he does not have access to a phone.  Scheduled a visit at [address] at his mom's house this morning.  He is suicidal and needs to talk.  He agreed to a 11 am visit.

## 2013-03-11 NOTE — ED Notes (Signed)
MCT Note:    Clients mother called to cancel his appt today, stating he was doing well this morning, no suicidal or violent thoughts, and has left for the day. She states she will have client call team to reschedule.

## 2013-03-12 NOTE — ED Notes (Signed)
MCT Note: Phone contact with client who agreed to a visit 6/28 between 2:30-3:00

## 2013-03-12 NOTE — ED Notes (Signed)
RCMCT note: spoke with client and has agree to meet with team today at 2pm. Client stated he has appt with DSS this morning and should be done by 12:00. Client is looking for mental health linkage.

## 2013-03-12 NOTE — ED Notes (Signed)
MCT Note: Arrived for scheduled visit and client was not home, msg left for client to call back to team to reschedule.

## 2013-03-13 NOTE — ED Notes (Signed)
TC to client who prefers to be seen on Monday 03/15/13 - his grandchild was born yesterday which is why he missed his appt w/the team. He states this weekend is not good for him. Agrees to 1130 am appt at [facility] address. Patient denied any suicidal or homicidal ideations at this time. Patient agrees if they feel unsafe they will call [facility]/Mobile Crisis at 211 OR [phone] or in an emergency call police at 911.

## 2013-03-15 NOTE — ED Notes (Signed)
Called home phone number to confirm visit, mom states pt not there, likely at gf's home but doesn't know address. Due to pt poor history of compliance I will put case pending and await his call back. Mother will give message to pt when and if she sees him to call the team back to confirm a visit.

## 2013-03-16 ENCOUNTER — Ambulatory Visit
Admit: 2013-03-16 | Discharge: 2013-04-15 | Disposition: A | Discharge status: Home / Self Care | Payer: Self-pay | Source: Ambulatory Visit | Attending: Psychiatry | Admitting: Psychiatry

## 2013-03-16 NOTE — ED Notes (Signed)
T/C to clt. Phone rings, unable to leave msg. Team had contact with clt's mom yesterday and stated that clt would need to contact team. Case pending clt.

## 2013-03-17 NOTE — ED Notes (Signed)
Called listed number, reached pt, he says he has been drinking this morning. So he can not be seen. He also says he has been homeless and sleeping outside so he can not be reached. Overall he is reluctant to schedule giving excuses but says he wants our services. Pt now has a personal cell phone [phone]. I requested he call us tomorrow to schedule a visit. If he doesn't I would close the case.

## 2013-03-18 NOTE — ED Notes (Signed)
MCT Note: Telephone call made to client on his personal cell phone [phone] as listed. The name on the voicemail was for a Mitzie Na. Therefore, Clinical research associate did not leave a message. Telephone call made to client at the original number on the referral [phone]. A woman answered the phone and indicated that [place] wasn't home. A message was left with her asking that he contact the [facility] via [facility].

## 2013-03-19 NOTE — ED Notes (Signed)
RCMCT note: case is closed due to no call back from client.

## 2013-04-29 ENCOUNTER — Ambulatory Visit
Admit: 2013-04-29 | Discharge: 2013-04-29 | Disposition: A | Discharge status: Home / Self Care | Payer: Self-pay | Source: Ambulatory Visit | Attending: Adult Health | Admitting: Adult Health

## 2013-04-29 LAB — COMPREHENSIVE METABOLIC PANEL
ALT: 26 U/L (ref 0–50)
AST: 32 U/L (ref 0–50)
Albumin: 4.1 g/dL (ref 3.5–5.2)
Alk Phos: 112 U/L (ref 40–130)
Anion Gap: 12 (ref 7–16)
Bilirubin,Total: 0.5 mg/dL (ref 0.0–1.2)
CO2: 25 mmol/L (ref 20–28)
Calcium: 9.4 mg/dL (ref 9.0–10.3)
Chloride: 101 mmol/L (ref 96–108)
Creatinine: 0.69 mg/dL (ref 0.67–1.17)
GFR,Black: 140 *
GFR,Caucasian: 121 *
Glucose: 73 mg/dL (ref 60–99)
Lab: 8 mg/dL (ref 6–20)
Potassium: 4 mmol/L (ref 3.3–5.1)
Sodium: 138 mmol/L (ref 133–145)
Total Protein: 7.6 g/dL (ref 6.3–7.7)

## 2013-04-29 LAB — TIBC
Iron: 54 ug/dL (ref 45–170)
TIBC: 272 ug/dL (ref 250–450)
Transferrin Saturation: 20 % (ref 20–55)

## 2013-04-29 LAB — VITAMIN B12: Vitamin B12: 381 pg/mL (ref 211–946)

## 2013-04-29 LAB — TSH: TSH: 0.64 u[IU]/mL (ref 0.27–4.20)

## 2013-04-30 LAB — LYMPHOCYTE SUBSET (T & B CELLS)
B LYM #(CD19): 82 cells/uL — ABNORMAL LOW (ref 120–725)
B LYM %(CD19): 5 % — ABNORMAL LOW (ref 7–36)
CD4#: 130 cells/uL — ABNORMAL LOW (ref 496–2186)
CD4%: 9 % — ABNORMAL LOW (ref 32–71)
CD4/CD8: 0.1 — ABNORMAL LOW (ref 0.7–3.0)
CD8#: 902 cells/uL (ref 177–1137)
NK LYM #(CD16+56): 292 cells/uL (ref 37–758)
NK LYM %(CD16+56): 19 % (ref 4–26)
T LYM #(CD3): 1132 cells/uL (ref 754–2810)
T LYM %(CD3): 74 % (ref 54–87)
T Suppress %(CD8): 59 % — ABNORMAL HIGH (ref 10–38)

## 2013-04-30 LAB — CYTOMEGALOVIRUS IGG AB: CMV IgG: POSITIVE

## 2013-05-05 LAB — TB AG T-CELL STIMULATION: TB Ag T-Cell Stimulation: 0

## 2013-09-16 ENCOUNTER — Other Ambulatory Visit: Payer: Self-pay | Admitting: Gastroenterology

## 2013-09-16 ENCOUNTER — Inpatient Hospital Stay
Admission: EM | Admit: 2013-09-16 | Disposition: A | Discharge status: Home / Self Care | Payer: Self-pay | Source: Ambulatory Visit | Attending: Otolaryngology | Admitting: Otolaryngology

## 2013-09-16 DIAGNOSIS — S0292XA Unspecified fracture of facial bones, initial encounter for closed fracture: Principal | ICD-10-CM

## 2013-09-16 HISTORY — PX: OTHER SURGICAL HISTORY: SHX169

## 2013-09-16 LAB — RUQ PANEL (ED ONLY)
ALT: 33 U/L (ref 0–50)
Albumin: 4.7 g/dL (ref 3.5–5.2)
Alk Phos: 100 U/L (ref 40–130)
Amylase: 193 U/L — ABNORMAL HIGH (ref 28–100)
Bilirubin,Direct: <0.2 mg/dL (ref 0.0–0.3)
Bilirubin,Total: 0.3 mg/dL (ref 0.0–1.2)
Lipase: 31 U/L (ref 13–60)
Total Protein: 7.9 g/dL — ABNORMAL HIGH (ref 6.3–7.7)

## 2013-09-16 LAB — CBC AND DIFFERENTIAL
Baso # K/uL: 0 10*3/uL (ref 0.0–0.1)
Basophil %: 0.2 % (ref 0.2–1.2)
Eos # K/uL: 0 10*3/uL (ref 0.0–0.5)
Eosinophil %: 0.2 % — ABNORMAL LOW (ref 0.8–7.0)
Hematocrit: 45 % (ref 40–51)
Hemoglobin: 15 g/dL (ref 13.7–17.5)
Lymph # K/uL: 0.8 10*3/uL — ABNORMAL LOW (ref 1.3–3.6)
Lymphocyte %: 6.4 % — ABNORMAL LOW (ref 21.8–53.1)
MCH: 33 pg/cell — ABNORMAL HIGH (ref 26–32)
MCHC: 33 g/dL (ref 32–37)
MCV: 100 fL — ABNORMAL HIGH (ref 79–92)
Mono # K/uL: 0.9 10*3/uL — ABNORMAL HIGH (ref 0.3–0.8)
Monocyte %: 6.5 % (ref 5.3–12.2)
Neut # K/uL: 11.4 10*3/uL — ABNORMAL HIGH (ref 1.8–5.4)
Platelets: 237 10*3/uL (ref 150–330)
RBC: 4.5 MIL/uL — ABNORMAL LOW (ref 4.6–6.1)
RDW: 12.5 % (ref 11.6–14.4)
Seg Neut %: 86.7 % — ABNORMAL HIGH (ref 34.0–67.9)
WBC: 13.1 10*3/uL — ABNORMAL HIGH (ref 4.2–9.1)

## 2013-09-16 LAB — PROTIME-INR
INR: 0.9 — ABNORMAL LOW (ref 1.0–1.2)
Protime: 9.7 s (ref 9.2–12.3)

## 2013-09-16 LAB — BASIC METABOLIC PANEL
Anion Gap: 19 — ABNORMAL HIGH (ref 7–16)
CO2: 17 mmol/L — ABNORMAL LOW (ref 20–28)
Calcium: 8.4 mg/dL — ABNORMAL LOW (ref 9.0–10.3)
Chloride: 106 mmol/L (ref 96–108)
Creatinine: 0.73 mg/dL (ref 0.67–1.17)
Glucose: 110 mg/dL — ABNORMAL HIGH (ref 60–99)
Lab: 6 mg/dL (ref 6–20)
Potassium: 4.6 mmol/L (ref 3.3–5.1)
Sodium: 142 mmol/L (ref 133–145)

## 2013-09-16 LAB — TYPE AND SCREEN
ABO RH Blood Type: B POS
Antibody Screen: NEGATIVE

## 2013-09-16 LAB — LACTATE, PLASMA: Lactate: 3.2 mmol/L — ABNORMAL HIGH (ref 0.5–2.2)

## 2013-09-16 LAB — APTT: aPTT: 25 s — ABNORMAL LOW (ref 25.8–37.9)

## 2013-09-16 LAB — HEMOLYZED TEST

## 2013-09-16 LAB — ETHANOL: Ethanol: 246 mg/dL

## 2013-09-16 MED ORDER — ONDANSETRON HCL 2 MG/ML IV SOLN *I*
4.0000 mg | Freq: Two times a day (BID) | INTRAMUSCULAR | Status: DC | PRN
Start: 2013-09-16 — End: 2013-09-18
  Administered 2013-09-16: 4 mg via INTRAVENOUS
  Filled 2013-09-16: qty 2

## 2013-09-16 MED ORDER — SODIUM CHLORIDE 0.9 % IV BOLUS *I*
1000.0000 mL | Freq: Once | Status: AC
Start: 2013-09-16 — End: 2013-09-16
  Administered 2013-09-16: 1000 mL via INTRAVENOUS

## 2013-09-16 MED ORDER — AMPICILLIN-SULBACTAM IN NS 3 GM *I*
3000.0000 mg | Freq: Once | INTRAMUSCULAR | Status: AC
Start: 2013-09-16 — End: 2013-09-16
  Administered 2013-09-16: 3000 mg via INTRAVENOUS
  Filled 2013-09-16: qty 100

## 2013-09-16 MED ORDER — HYDROMORPHONE HCL PF 1 MG/ML IJ SOLN *WRAPPED*
1.0000 mg | INTRAMUSCULAR | Status: DC | PRN
Start: 2013-09-16 — End: 2013-09-18
  Administered 2013-09-16 – 2013-09-18 (×13): 1 mg via INTRAVENOUS
  Filled 2013-09-16 (×13): qty 1

## 2013-09-16 MED ORDER — PROMETHAZINE HCL 25 MG/ML IJ SOLN *I*
12.5000 mg | Freq: Four times a day (QID) | INTRAMUSCULAR | Status: DC | PRN
Start: 2013-09-16 — End: 2013-09-18

## 2013-09-16 MED ORDER — DOCUSATE SODIUM 100 MG PO CAPS *I*
100.0000 mg | ORAL_CAPSULE | Freq: Two times a day (BID) | ORAL | Status: DC
Start: 2013-09-16 — End: 2013-09-18
  Administered 2013-09-16 – 2013-09-18 (×3): 100 mg via ORAL
  Filled 2013-09-16 (×4): qty 1

## 2013-09-16 MED ORDER — SODIUM CHLORIDE 0.9 % IV SOLN WRAPPED *I*
100.0000 mL/h | Status: DC
Start: 2013-09-16 — End: 2013-09-18
  Administered 2013-09-16 – 2013-09-18 (×6): 100 mL/h via INTRAVENOUS

## 2013-09-16 NOTE — ED Notes (Signed)
Pt resting in bed with c-collar in place. Reports things "are starting to come back." Rt eye swollen, edema, tenderness, ecchymosis. L temporal swelling and tenderness, HA. Reports teeth and jaw intact. Nose with some dried blood. Ears without bleeding.  Denies other injury, movies all extremities.
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
## 2013-09-16 NOTE — ED Notes (Addendum)
Spoke with provider, stating that pt BAC was in 200's at 6am so will not be able to be cleared from spinal precautions until 6pm. Pt laying flat, aware. Provider states he will check orders and determine fluids/pain medication rx. Continue to monitor.

## 2013-09-16 NOTE — ED Notes (Signed)
Pt asking for pain medication, reporting he feels thirsty. Provider paged x2.

## 2013-09-16 NOTE — ED Notes (Signed)
Pt to room at this time, MD at bedside.
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
## 2013-09-16 NOTE — ED Notes (Addendum)
Pt asking to eat. Regular tray ordered for pt. Pt NPO after midnight per provider. Continue to monitor.
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
## 2013-09-17 ENCOUNTER — Encounter: Payer: Self-pay | Admitting: Otolaryngology

## 2013-09-17 LAB — CBC AND DIFFERENTIAL
Baso # K/uL: 0 10*3/uL (ref 0.0–0.1)
Basophil %: 0.3 % (ref 0.2–1.2)
Eos # K/uL: 0.1 10*3/uL (ref 0.0–0.5)
Eosinophil %: 1.7 % (ref 0.8–7.0)
Hematocrit: 38 % — ABNORMAL LOW (ref 40–51)
Hemoglobin: 12.8 g/dL — ABNORMAL LOW (ref 13.7–17.5)
Lymph # K/uL: 1.4 10*3/uL (ref 1.3–3.6)
Lymphocyte %: 20.5 % — ABNORMAL LOW (ref 21.8–53.1)
MCH: 34 pg/cell — ABNORMAL HIGH (ref 26–32)
MCHC: 34 g/dL (ref 32–37)
MCV: 99 fL — ABNORMAL HIGH (ref 79–92)
Mono # K/uL: 1.1 10*3/uL — ABNORMAL HIGH (ref 0.3–0.8)
Monocyte %: 15.1 % — ABNORMAL HIGH (ref 5.3–12.2)
Neut # K/uL: 4.4 10*3/uL (ref 1.8–5.4)
Platelets: 222 10*3/uL (ref 150–330)
RBC: 3.8 MIL/uL — ABNORMAL LOW (ref 4.6–6.1)
RDW: 11.9 % (ref 11.6–14.4)
Seg Neut %: 62.4 % (ref 34.0–67.9)
WBC: 7 10*3/uL (ref 4.2–9.1)

## 2013-09-17 LAB — BASIC METABOLIC PANEL
Anion Gap: 14 (ref 7–16)
CO2: 22 mmol/L (ref 20–28)
Calcium: 8.5 mg/dL — ABNORMAL LOW (ref 9.0–10.3)
Chloride: 99 mmol/L (ref 96–108)
Creatinine: 0.69 mg/dL (ref 0.67–1.17)
GFR,Black: 139 *
GFR,Caucasian: 121 *
Glucose: 86 mg/dL (ref 60–99)
Lab: 5 mg/dL — ABNORMAL LOW (ref 6–20)
Potassium: 3.6 mmol/L (ref 3.3–5.1)
Sodium: 135 mmol/L (ref 133–145)

## 2013-09-17 LAB — DRUG SCREEN CHEMICAL DEPENDENCY, URINE
Amphetamine,UR: NEGATIVE
Benzodiazepinen,UR: NEGATIVE
Cocaine/Metab,UR: NEGATIVE
Opiates,UR: POSITIVE
THC Metabolite,UR: POSITIVE

## 2013-09-17 MED ORDER — AMPICILLIN-SULBACTAM IN NS 3 GM *I*
3000.0000 mg | Freq: Three times a day (TID) | INTRAMUSCULAR | Status: DC
Start: 2013-09-17 — End: 2013-09-18
  Administered 2013-09-17 – 2013-09-18 (×4): 3000 mg via INTRAVENOUS
  Filled 2013-09-17 (×7): qty 100

## 2013-09-17 MED ORDER — HYDROMORPHONE HCL 2 MG/ML IJ SOLN *WRAPPED*
0.4000 mg | INTRAMUSCULAR | Status: DC | PRN
Start: 2013-09-17 — End: 2013-09-17

## 2013-09-17 MED ORDER — CHLORHEXIDINE GLUCONATE 0.12 % MT SOLN *I*
15.0000 mL | Freq: Four times a day (QID) | OROMUCOSAL | Status: DC
Start: 2013-09-17 — End: 2013-09-18
  Administered 2013-09-17 – 2013-09-18 (×4): 15 mL via OROMUCOSAL
  Filled 2013-09-17 (×8): qty 15

## 2013-09-17 MED ORDER — ONDANSETRON HCL 2 MG/ML IV SOLN *I*
1.0000 mg | Freq: Once | INTRAMUSCULAR | Status: DC | PRN
Start: 2013-09-17 — End: 2013-09-17

## 2013-09-17 MED ORDER — PROMETHAZINE HCL 25 MG/ML IJ SOLN *I*
6.2500 mg | Freq: Once | INTRAMUSCULAR | Status: DC | PRN
Start: 2013-09-17 — End: 2013-09-17

## 2013-09-17 MED ORDER — NEOMYCIN-BACITRACIN ZN-POLYMYX 3.5-400-10000 OP OINT *I*
TOPICAL_OINTMENT | Freq: Four times a day (QID) | OPHTHALMIC | Status: DC
Start: 2013-09-17 — End: 2013-09-18
  Filled 2013-09-17: qty 3.5

## 2013-09-17 MED ORDER — HALOPERIDOL LACTATE 5 MG/ML IJ SOLN *I*
0.5000 mg | Freq: Once | INTRAMUSCULAR | Status: DC | PRN
Start: 2013-09-17 — End: 2013-09-17
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
## 2013-09-17 NOTE — Progress Notes (Signed)
Utilization Management    Level of Care Inpatient as of the date 09/16/2013      Derek Walker     Pager: [phone]

## 2013-09-17 NOTE — Anesthesia Post-procedure Eval (Signed)
Anesthesia Post-op Note    Patient: Sunnie NielsenRalph D Shupert    Procedure(s) Performed: Orif midface fractures    Anesthesia type: General    Patient location: PACU    Mental Status: Recovered to baseline    Patient able to participate in this evaluation: yes  Last Vitals: BP: 138/86 mmHg (09/17/13 1445)  BP MAP : 99 mmHg (09/17/13 1445)  Heart Rate: 75 (09/17/13 1445)  Temp: 37.5 C (99.5 F) (09/17/13 1415)  Resp: 16 (09/17/13 1445)  Height: 180.3 cm (5\' 11" ) (09/16/13 0348)  Weight: 75.297 kg (166 lb) (09/16/13 0348)  BMI (Calculated): 23.2 (09/16/13 0348)  BSA (Calculated - sq m): 1.94 sq meters (09/16/13 0348)  SpO2: 91 % (09/17/13 1445)      Post-op vital signs noted above are within patients normal range  Post-op vitals signs: stable  Respiratory function: baseline and stable    Airway patent: Yes    Cardiovascular and hydration status stable: Yes    Post-Op pain: Adequate analgesia    Post-Op nausea and vomiting: none    Post-Op assessment: no apparent anesthetic complications    Complications: none    Attending Attestation: All indicated post anesthesia care provided    Author: Darlyn ChamberLATA U Kenden Brandt, MD  as of: 09/17/2013  at: 3:09 PM
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
## 2013-09-17 NOTE — Progress Notes (Signed)
Assumed care of pt at 0700.  Pt A&Ox3, VSS.  Pt pain managed with prn medication.  Pt up ad lib, tolerating well.  Pt to OR today at 1015.  Will continue to monitor upon return to floor.

## 2013-09-17 NOTE — Progress Notes (Addendum)
Patient oriented x 3. No neck pain, no distracting pain.   No tenderness to palpation over cervical spine. Full ROM of neck without pain.    Cervical spine cleared. C collar taken off    Signed by Kennon Portelalifford Suyash Amory, MD   09/17/2013 10:05AM

## 2013-09-17 NOTE — Progress Notes (Signed)
UPDATES TO PATIENT'S CONDITION on the DAY OF SURGERY/PROCEDURE    I. Updates to Patient's Condition (to be completed by a provider privileged to complete a H&P, following reassessment of the patient by the provider):    Day of Surgery/Procedure Update:  History  History reviewed and no change    Physical  Physical exam updated and no change              II. Procedure Readiness   I have reviewed the patient's H&P and updated condition. By completing and signing this form, I attest that this patient is ready for surgery/procedure.      III. Attestation   I have reviewed the updated information regarding the patient's condition and it is appropriate to proceed with the planned surgery/procedure.    Derek PippinsBENJAMIN Rorik Vespa, MD as of 10:13 AM 09/17/2013
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
## 2013-09-18 MED ORDER — CHLORHEXIDINE GLUCONATE 0.12 % MT SOLN *I*
15.0000 mL | Freq: Two times a day (BID) | OROMUCOSAL | Status: AC
Start: 2013-09-18 — End: 2013-09-25

## 2013-09-18 MED ORDER — OXYCODONE HCL 5 MG PO TABS *I*
5.0000 mg | ORAL_TABLET | ORAL | Status: AC | PRN
Start: 2013-09-18 — End: 2013-09-25

## 2013-09-18 MED ORDER — DOCUSATE SODIUM 100 MG PO CAPS *I*
100.0000 mg | ORAL_CAPSULE | Freq: Two times a day (BID) | ORAL | Status: AC
Start: 2013-09-18 — End: 2013-09-25

## 2013-09-18 MED ORDER — HYDROCODONE-ACETAMINOPHEN 5-325 MG PO TABS *I*
1.0000 | ORAL_TABLET | ORAL | Status: DC | PRN
Start: 2013-09-18 — End: 2013-09-18
  Administered 2013-09-18: 1 via ORAL
  Filled 2013-09-18: qty 1

## 2013-09-18 MED ORDER — NEOMYCIN-BACITRACIN ZN-POLYMYX 3.5-400-10000 OP OINT *I*
TOPICAL_OINTMENT | Freq: Four times a day (QID) | OPHTHALMIC | Status: AC
Start: 2013-09-18 — End: 2013-09-25

## 2013-09-18 MED ORDER — HYDROCODONE-ACETAMINOPHEN 5-325 MG PO TABS *I*
1.0000 | ORAL_TABLET | ORAL | Status: DC | PRN
Start: 2013-09-18 — End: 2013-09-18

## 2013-09-18 MED ORDER — IBUPROFEN 200 MG PO TABS *I*
600.0000 mg | ORAL_TABLET | Freq: Four times a day (QID) | ORAL | Status: DC
Start: 2013-09-18 — End: 2013-09-18
  Administered 2013-09-18: 600 mg via ORAL
  Filled 2013-09-18: qty 3

## 2013-09-18 MED ORDER — INFLUENZA VAC SPLIT QUAD PF 0.5 ML IM SUSP *I*
0.5000 mL | INTRAMUSCULAR | Status: AC
Start: 2013-09-18 — End: 2013-09-18
  Administered 2013-09-18: 0.5 mL via INTRAMUSCULAR
  Filled 2013-09-18: qty 0.5

## 2013-09-18 NOTE — Progress Notes (Signed)
Switched discharge script from norco to oxycodone. Patient has a history of transamminitis (etiology unspecified) and he reports he has been advised by his PCP against taking acetaminophen.     Patient had also just come in from smoking outside. I discussed the importance of quitting smoking for optimal healing of his wounds and fractures. He says he will give it a go.    Almira CoasterMark Yaviel Kloster, MD,PHD 09/18/2013 2:45 PM
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
## 2013-09-18 NOTE — Discharge Instructions (Signed)
POST-OPERATIVE CARE INSTRUCTIONS (REPAIR OF FACIAL FRACTURES)    You have undergone a repair of facial fractures.  This information is provided to facilitate proper patient care following surgery.    ACTIVITY:  You are encouraged to rest 1-3 days following surgery and then gradually increase activity as tolerated.  No strenuous activity or heavy lifting for 2 weeks.  Avoid foreful sneezing during this time as well.  You are encouraged to move and ambulate in order to prevent blood clots in your legs.  For the first six months, it is advisable to avoid excessive sun exposure to your scar.  If you will be exposed to sunlight, you should apply sun block to the scar and wear a wide-brimmed hat to decrease the amount of sun exposure.    WOUND:  Oral hygiene: A clean mouth will aid healing.  If you have an incision in your mouth, your physician may instruct you to be cautious with brushing in this area during the first two weeks after surgery.  Additionally, you may be prescribed a special mouth rinse to aid in keeping your mouth clean.     Swelling: It may be beneficial to apply ice for the first 48 hours after surgery.  The ice should be placed in a plastic bag and alternated 20 minutes on and 20 minutes off.  After 48 hours, heat may be beneficial in decreasing swelling.  A heating pad, hot water bottle or wash cloth with warm water can be used.  Be careful not to make the water so hot as to scald the skin.  After the first 4-7 days, swelling should begin to decrease.  Swelling typically takes 10-14 days to resolve.  If you notice an increase in swelling after this time period, it may indicate an infection or other problem, and you should call the office immediately.    Incision care: Depending on the type of fracture, it may have been necessary to make an incision on your skin or to repair lacerations/wounds associated with your original injury.  The incision should be gently cleaned twice a day with soap and water or  peroxide to remove crusting.  A topical antibiotic ointment (bacitracin, polysporin, etc) may be applied to the wounds and stitches.  You may shower the day after surgery and pat dry incisions.  Do not scrub or soak incisions.  Do not immerse yourself in water (tub-bathing, swimming, etc) for 2 weeks from your date of surgery.      PAIN:  It is common to experience mild to moderate pain after surgery, and so you have been prescribed pain medication (oxycodone) to help alleviate this discomfort.  Please follow the instructions carefully.  Do not drive or operate machinery while taking narcotic pain medication.  You should continue on a bowel regimen (senna, colace and/or miralax) to prevent constipation while taking narcotic pain medication.     For mild pain,  ibuprofen (advil, motrin or aleve) alone may be effective.  Alcohol beverages interact with your narcotic pain medication and may impair your ability to heal your fracture.  Alcohol should be avoided for the duration of your treatment. You should also try to quit smoking.     ANTIBIOTICS:   Take the antibiotic as prescribed until you follow-up in clinic.    DIET:  Start with a clear liquid diet and advance as tolerated to a soft diet.  You may use an anti-emetic if you experience nausea.      FOLLOW-UP:  You will  follow-up with Dr. Neil Crouchrane 7-14 days after surgery if necessary. Please call [phone] if desired.    CALL THE OFFICE IF:  Fever >101.5.  Increased swelling or redness over the incision.  Any bleeding or foul drainage from the incision.  Pain not relieved by pain medications.  Difficulty breathing.  Vision changes.    Call our office at [phone] with any questions or concerns.

## 2013-09-20 LAB — THC SEMI-QUANT, URINE
Creatinine,UR: 145 mg/dL (ref 20–300)
Semi-Quant THC,UR: 310 ng/mL
THC/Creat Ratio: 214 ng/mg

## 2013-09-20 LAB — CONFIRM THC METABOLITE, URINE: Confirm THC Metab: POSITIVE

## 2013-09-20 LAB — CONFIRM OPIATES: Confirm Opiates: POSITIVE

## 2013-09-20 MED FILL — Ondansetron HCl Inj 4 MG/2ML (2 MG/ML): INTRAMUSCULAR | Qty: 2 | Status: AC

## 2013-09-20 MED FILL — Midazolam HCl Inj 2 MG/2ML (Base Equivalent): INTRAMUSCULAR | Qty: 2 | Status: AC

## 2013-09-20 MED FILL — Glycopyrrolate Inj 0.4 MG/2ML (0.2 MG/ML): INTRAMUSCULAR | Qty: 4 | Status: AC

## 2013-09-20 MED FILL — Dexamethasone Sod Phosphate Preservative Free Inj 10 MG/ML: INTRAMUSCULAR | Qty: 1 | Status: AC

## 2013-09-20 MED FILL — Lidocaine HCl Local Preservative Free (PF) Inj 2%: INTRAMUSCULAR | Qty: 5 | Status: AC

## 2013-09-20 MED FILL — Neostigmine Methylsulfate Inj 1 MG/ML: Qty: 10 | Status: AC

## 2013-09-20 MED FILL — Rocuronium Bromide IV Soln 50 MG/5ML (10 MG/ML): Qty: 5 | Status: AC

## 2013-09-20 MED FILL — Lidocaine Inj 1% w/ Epinephrine-1:100000: INTRAMUSCULAR | Qty: 50 | Status: AC

## 2013-09-20 MED FILL — Propofol IV Emul 200 MG/20ML (10 MG/ML): INTRAVENOUS | Qty: 40 | Status: AC

## 2013-09-20 MED FILL — Fentanyl Citrate Preservative Free (PF) Inj 100 MCG/2ML: INTRAMUSCULAR | Qty: 10 | Status: AC

## 2013-09-23 LAB — EKG 12-LEAD
P: 68 degrees
QRS: 72 degrees
Rate: 71 {beats}/min
Severity: NORMAL
Severity: NORMAL
T: 51 degrees

## 2013-09-24 ENCOUNTER — Encounter: Payer: Self-pay | Admitting: Emergency Medicine

## 2013-09-24 ENCOUNTER — Observation Stay
Admission: EM | Admit: 2013-09-24 | Disposition: A | Discharge status: Home / Self Care | Payer: Self-pay | Source: Ambulatory Visit | Attending: Emergency Medicine | Admitting: Emergency Medicine

## 2013-09-24 DIAGNOSIS — L03213 Periorbital cellulitis: Secondary | ICD-10-CM | POA: Diagnosis present

## 2013-09-24 LAB — COMPREHENSIVE METABOLIC PANEL
ALT: 25 U/L (ref 0–50)
AST: 54 U/L — ABNORMAL HIGH (ref 0–50)
Albumin: 4.3 g/dL (ref 3.5–5.2)
Alk Phos: 95 U/L (ref 40–130)
Anion Gap: 13 (ref 7–16)
Bilirubin,Total: 0.2 mg/dL (ref 0.0–1.2)
CO2: 26 mmol/L (ref 20–28)
Calcium: 8.9 mg/dL — ABNORMAL LOW (ref 9.0–10.3)
Chloride: 102 mmol/L (ref 96–108)
Creatinine: 0.7 mg/dL (ref 0.67–1.17)
GFR,Black: 139 *
GFR,Caucasian: 120 *
Glucose: 73 mg/dL (ref 60–99)
Lab: 7 mg/dL (ref 6–20)
Potassium: 4.2 mmol/L (ref 3.3–5.1)
Sodium: 141 mmol/L (ref 133–145)
Total Protein: 7.4 g/dL (ref 6.3–7.7)

## 2013-09-24 LAB — CBC AND DIFFERENTIAL
Baso # K/uL: 0 10*3/uL (ref 0.0–0.1)
Basophil %: 0.5 % (ref 0.2–1.2)
Eos # K/uL: 0.2 10*3/uL (ref 0.0–0.5)
Eosinophil %: 3.6 % (ref 0.8–7.0)
Hematocrit: 42 % (ref 40–51)
Hemoglobin: 14.1 g/dL (ref 13.7–17.5)
Lymph # K/uL: 1.3 10*3/uL (ref 1.3–3.6)
Lymphocyte %: 22 % (ref 21.8–53.1)
MCH: 33 pg/cell — ABNORMAL HIGH (ref 26–32)
MCHC: 34 g/dL (ref 32–37)
MCV: 99 fL — ABNORMAL HIGH (ref 79–92)
Mono # K/uL: 0.5 10*3/uL (ref 0.3–0.8)
Monocyte %: 7.5 % (ref 5.3–12.2)
Neut # K/uL: 4.1 10*3/uL (ref 1.8–5.4)
Platelets: 288 10*3/uL (ref 150–330)
RBC: 4.2 MIL/uL — ABNORMAL LOW (ref 4.6–6.1)
RDW: 12.1 % (ref 11.6–14.4)
Seg Neut %: 66.4 % (ref 34.0–67.9)
WBC: 6.1 10*3/uL (ref 4.2–9.1)

## 2013-09-24 LAB — CRP: CRP: 4 mg/L (ref 0–10)

## 2013-09-24 LAB — LACTATE, PLASMA: Lactate: 2.6 mmol/L — ABNORMAL HIGH (ref 0.5–2.2)

## 2013-09-24 LAB — SEDIMENTATION RATE, AUTOMATED: Sedimentation Rate: 19 mm/hr — ABNORMAL HIGH (ref 0–15)

## 2013-09-24 MED ORDER — ACETAMINOPHEN 325 MG PO TABS *I*
650.0000 mg | ORAL_TABLET | ORAL | Status: DC | PRN
Start: 2013-09-24 — End: 2013-09-26

## 2013-09-24 MED ORDER — SODIUM CHLORIDE 0.9 % IV BOLUS *I*
1000.0000 mL | Freq: Once | Status: AC
Start: 2013-09-24 — End: 2013-09-24
  Administered 2013-09-24: 1000 mL via INTRAVENOUS

## 2013-09-24 MED ORDER — MORPHINE SULFATE 4 MG/ML IJ SOLN
4.0000 mg | Freq: Once | INTRAMUSCULAR | Status: AC
Start: 2013-09-24 — End: 2013-09-24
  Administered 2013-09-24: 4 mg via INTRAVENOUS
  Filled 2013-09-24: qty 1

## 2013-09-24 MED ORDER — AMPICILLIN-SULBACTAM IN NS 3 GM *I*
3000.0000 mg | Freq: Once | INTRAMUSCULAR | Status: AC
Start: 2013-09-24 — End: 2013-09-24
  Administered 2013-09-24: 3000 mg via INTRAVENOUS
  Filled 2013-09-24: qty 100

## 2013-09-24 MED ORDER — DIAZEPAM 5 MG PO TABS *I*
5.0000 mg | ORAL_TABLET | Freq: Four times a day (QID) | ORAL | Status: DC | PRN
Start: 2013-09-24 — End: 2013-09-26

## 2013-09-24 MED ORDER — AMPICILLIN-SULBACTAM IN NS 3 GM *I*
3000.0000 mg | Freq: Four times a day (QID) | INTRAMUSCULAR | Status: DC
Start: 2013-09-25 — End: 2013-09-26
  Administered 2013-09-25 – 2013-09-26 (×5): 3000 mg via INTRAVENOUS
  Filled 2013-09-24 (×5): qty 100

## 2013-09-24 MED ORDER — OXYCODONE HCL 5 MG PO TABS *I*
5.0000 mg | ORAL_TABLET | ORAL | Status: DC | PRN
Start: 2013-09-24 — End: 2013-09-26
  Administered 2013-09-24 – 2013-09-26 (×5): 5 mg via ORAL
  Filled 2013-09-24 (×6): qty 1

## 2013-09-24 MED ORDER — ARTIFICIAL TEARS OP OINT *WRAPPED* *I*
TOPICAL_OINTMENT | OPHTHALMIC | Status: DC | PRN
Start: 2013-09-24 — End: 2013-09-26
  Administered 2013-09-25 (×2): 1 via OPHTHALMIC
  Filled 2013-09-24: qty 1

## 2013-09-24 MED ORDER — DEXAMETHASONE SODIUM PHOSPHATE 10 MG/ML IJ SOLN *I*
10.0000 mg | Freq: Three times a day (TID) | INTRAMUSCULAR | Status: DC
Start: 2013-09-24 — End: 2013-09-25
  Administered 2013-09-24 – 2013-09-25 (×3): 10 mg via INTRAVENOUS
  Filled 2013-09-24 (×3): qty 1

## 2013-09-24 MED ORDER — KETOROLAC TROMETHAMINE 30 MG/ML IJ SOLN *I*
15.0000 mg | Freq: Four times a day (QID) | INTRAMUSCULAR | Status: DC | PRN
Start: 2013-09-24 — End: 2013-09-26
  Administered 2013-09-25: 15 mg via INTRAVENOUS
  Filled 2013-09-24: qty 1

## 2013-09-24 MED ORDER — TOBRAMYCIN SULFATE 0.3 % OP SOLN *I*
1.0000 [drp] | Freq: Four times a day (QID) | OPHTHALMIC | Status: DC
Start: 2013-09-24 — End: 2013-09-26
  Administered 2013-09-24 – 2013-09-26 (×8): 1 [drp] via OPHTHALMIC
  Filled 2013-09-24: qty 5

## 2013-09-24 MED ORDER — FAMOTIDINE 20 MG PO TABS *I*
40.0000 mg | ORAL_TABLET | Freq: Every day | ORAL | Status: DC
Start: 2013-09-25 — End: 2013-09-26
  Administered 2013-09-25 – 2013-09-26 (×2): 40 mg via ORAL
  Filled 2013-09-24 (×2): qty 2

## 2013-09-24 MED ORDER — SODIUM CHLORIDE 0.9 % IV SOLN WRAPPED *I*
100.0000 mL/h | Status: DC
Start: 2013-09-24 — End: 2013-09-24
  Administered 2013-09-24 (×2): 100 mL/h via INTRAVENOUS
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
## 2013-09-24 NOTE — First Provider Contact (Signed)
ED Medical Screening Exam Note    Initial provider evaluation performed by   ED First Provider Contact    Date/Time Event User Comments    09/24/13 1710 ED Provider First Contact Glennon MacWEBER, Jesicca Dipierro ANN Initial Face to Face Provider Contact        Patient with Right eye surgery, facial surgery on 09/16/13, now headache, Right eye drainage, sudden onset.  Denies fevers.  Orders placed:  LABS, CT, ANALGESIA and NPO     Patient requires further evaluation.     Deante Blough ANN Sohan Potvin, NP, 09/24/2013, 5:10 PM
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
## 2013-09-24 NOTE — ED Notes (Signed)
Provider at bedside

## 2013-09-24 NOTE — ED Notes (Signed)
Pt to CT scan.

## 2013-09-24 NOTE — ED Notes (Signed)
Plan of Care     Pain management, medications as prescribed, IV antibiotics, activity as tolerated, regular diet, comfort measures as needed, will continue to monitor.

## 2013-09-24 NOTE — ED Notes (Signed)
09/24/13 2209   Observation Care   Observation care initiated  Yes   Patient has been verbally notified of their observation status Yes
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
## 2013-09-25 LAB — BASIC METABOLIC PANEL
Anion Gap: 13 (ref 7–16)
CO2: 23 mmol/L (ref 20–28)
Calcium: 9.4 mg/dL (ref 9.0–10.3)
Chloride: 100 mmol/L (ref 96–108)
Creatinine: 0.62 mg/dL — ABNORMAL LOW (ref 0.67–1.17)
GFR,Black: 146 *
GFR,Caucasian: 126 *
Glucose: 184 mg/dL — ABNORMAL HIGH (ref 60–99)
Lab: 6 mg/dL (ref 6–20)
Potassium: 4.4 mmol/L (ref 3.3–5.1)
Sodium: 136 mmol/L (ref 133–145)

## 2013-09-25 LAB — CBC AND DIFFERENTIAL
Baso # K/uL: 0 10*3/uL (ref 0.0–0.1)
Basophil %: 0.5 % (ref 0.2–1.2)
Eos # K/uL: 0 10*3/uL (ref 0.0–0.5)
Eosinophil %: 0.2 % — ABNORMAL LOW (ref 0.8–7.0)
Hematocrit: 40 % (ref 40–51)
Hemoglobin: 13.8 g/dL (ref 13.7–17.5)
Lymph # K/uL: 0.5 10*3/uL — ABNORMAL LOW (ref 1.3–3.6)
Lymphocyte %: 7.8 % — ABNORMAL LOW (ref 21.8–53.1)
MCH: 33 pg/cell — ABNORMAL HIGH (ref 26–32)
MCHC: 34 g/dL (ref 32–37)
MCV: 97 fL — ABNORMAL HIGH (ref 79–92)
Mono # K/uL: 0.1 10*3/uL — ABNORMAL LOW (ref 0.3–0.8)
Monocyte %: 1.2 % — ABNORMAL LOW (ref 5.3–12.2)
Neut # K/uL: 5.4 10*3/uL (ref 1.8–5.4)
Platelets: 311 10*3/uL (ref 150–330)
RBC: 4.1 MIL/uL — ABNORMAL LOW (ref 4.6–6.1)
RDW: 11.8 % (ref 11.6–14.4)
Seg Neut %: 90.3 % — ABNORMAL HIGH (ref 34.0–67.9)
WBC: 6 10*3/uL (ref 4.2–9.1)

## 2013-09-25 MED ORDER — NICOTINE POLACRILEX 2 MG MT LOZG *I*
2.0000 mg | LOZENGE | OROMUCOSAL | Status: DC | PRN
Start: 2013-09-25 — End: 2013-09-26

## 2013-09-25 NOTE — ED Notes (Signed)
Pt's Right Eye swelling has decreased over night, will continue to monitor and treat per MD orders. Pt stated " I think the antibiotics are really helping, my eye is feeling better."
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
## 2013-09-25 NOTE — Progress Notes (Signed)
Utilization Management    Level of Care Observation service as of the date 09/24/2013      Vila Dory, RN     Pager: [phone]    Notified of observation LOC
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
## 2013-09-25 NOTE — ED Notes (Signed)
Plan of Care     IV abx, pain management, VS Q4H, comfort measures as needed, regular diet, activity as tolerated.

## 2013-09-25 NOTE — ED Notes (Signed)
Pt resting with significant other at bedside. Will continue to monitor

## 2013-09-25 NOTE — ED Notes (Signed)
R eye appears to have improved during day.  Pt is able to open eye.  States that it is feeling better as well.  Will continue to monitor.
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
## 2013-09-25 NOTE — ED Notes (Signed)
Plan of Care     IV ABT, pain control, eye drops, opthalmology consult, labs, vitals

## 2013-09-26 LAB — CBC AND DIFFERENTIAL
Baso # K/uL: 0 10*3/uL (ref 0.0–0.1)
Basophil %: 0.1 % — ABNORMAL LOW (ref 0.2–1.2)
Eos # K/uL: 0 10*3/uL (ref 0.0–0.5)
Eosinophil %: 0.1 % — ABNORMAL LOW (ref 0.8–7.0)
Hematocrit: 40 % (ref 40–51)
Hemoglobin: 13.6 g/dL — ABNORMAL LOW (ref 13.7–17.5)
Lymph # K/uL: 1.2 10*3/uL — ABNORMAL LOW (ref 1.3–3.6)
Lymphocyte %: 10 % — ABNORMAL LOW (ref 21.8–53.1)
MCH: 33 pg/cell — ABNORMAL HIGH (ref 26–32)
MCHC: 34 g/dL (ref 32–37)
MCV: 97 fL — ABNORMAL HIGH (ref 79–92)
Mono # K/uL: 1 10*3/uL — ABNORMAL HIGH (ref 0.3–0.8)
Monocyte %: 8.5 % (ref 5.3–12.2)
Neut # K/uL: 10 10*3/uL — ABNORMAL HIGH (ref 1.8–5.4)
Platelets: 276 10*3/uL (ref 150–330)
RBC: 4.1 MIL/uL — ABNORMAL LOW (ref 4.6–6.1)
RDW: 11.8 % (ref 11.6–14.4)
Seg Neut %: 81.3 % — ABNORMAL HIGH (ref 34.0–67.9)
WBC: 12.3 10*3/uL — ABNORMAL HIGH (ref 4.2–9.1)

## 2013-09-26 LAB — LACTATE, PLASMA: Lactate: 2.4 mmol/L — ABNORMAL HIGH (ref 0.5–2.2)

## 2013-09-26 LAB — BASIC METABOLIC PANEL
Anion Gap: 13 (ref 7–16)
CO2: 23 mmol/L (ref 20–28)
Calcium: 9 mg/dL (ref 9.0–10.3)
Chloride: 105 mmol/L (ref 96–108)
Creatinine: 0.62 mg/dL — ABNORMAL LOW (ref 0.67–1.17)
GFR,Black: 146 *
GFR,Caucasian: 126 *
Glucose: 94 mg/dL (ref 60–99)
Lab: 7 mg/dL (ref 6–20)
Potassium: 3.7 mmol/L (ref 3.3–5.1)
Sodium: 141 mmol/L (ref 133–145)

## 2013-09-26 MED ORDER — DOCUSATE SODIUM 100 MG PO CAPS *I*
100.0000 mg | ORAL_CAPSULE | Freq: Two times a day (BID) | ORAL | Status: DC
Start: 2013-09-26 — End: 2013-09-26
  Administered 2013-09-26: 100 mg via ORAL
  Filled 2013-09-26: qty 1

## 2013-09-26 MED ORDER — OXYCODONE-ACETAMINOPHEN 5-325 MG PO TABS *I*
1.0000 | ORAL_TABLET | Freq: Four times a day (QID) | ORAL | Status: DC | PRN
Start: 2013-09-26 — End: 2015-05-31

## 2013-09-26 MED ORDER — AMOXICILLIN-POT CLAVULANATE 875-125 MG PO TABS *I*
1.0000 | ORAL_TABLET | Freq: Two times a day (BID) | ORAL | Status: DC
Start: 2013-09-26 — End: 2013-09-26
  Administered 2013-09-26: 1 via ORAL
  Filled 2013-09-26: qty 1

## 2013-09-26 MED ORDER — IBUPROFEN 800 MG PO TABS *I*
800.0000 mg | ORAL_TABLET | Freq: Three times a day (TID) | ORAL | Status: AC | PRN
Start: 2013-09-26 — End: 2013-10-03

## 2013-09-26 MED ORDER — POLYETHYLENE GLYCOL 3350 PO PACK 17 GM *I*
17.0000 g | PACK | Freq: Every day | ORAL | Status: DC | PRN
Start: 2013-09-26 — End: 2013-09-26

## 2013-09-26 MED ORDER — AMOXICILLIN-POT CLAVULANATE 875-125 MG PO TABS *I*
1.0000 | ORAL_TABLET | Freq: Two times a day (BID) | ORAL | Status: AC
Start: 2013-09-26 — End: 2013-10-10

## 2013-09-26 MED ORDER — ARTIFICIAL TEARS OP OINT *WRAPPED* *I*
TOPICAL_OINTMENT | OPHTHALMIC | Status: AC | PRN
Start: 2013-09-26 — End: 2013-10-26

## 2013-09-26 MED ORDER — IBUPROFEN 800 MG PO TABS *I*
800.0000 mg | ORAL_TABLET | Freq: Three times a day (TID) | ORAL | Status: AC
Start: 2013-09-26 — End: 2013-10-03

## 2013-09-26 NOTE — ED Notes (Signed)
Plan of Care     ABT, labs, vitals, daily medications, pain control, possible d/c
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
## 2013-09-26 NOTE — ED Notes (Signed)
Pt sleeping, RR 16.
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
## 2013-09-27 ENCOUNTER — Ambulatory Visit: Payer: Self-pay | Admitting: Otolaryngology

## 2013-09-30 LAB — BLOOD CULTURE
Bacterial Blood Culture: 0
Bacterial Blood Culture: 0

## 2013-10-01 ENCOUNTER — Ambulatory Visit
Admit: 2013-10-01 | Discharge: 2013-10-01 | Disposition: A | Discharge status: Home / Self Care | Payer: Self-pay | Source: Ambulatory Visit | Attending: Infectious Diseases | Admitting: Infectious Diseases

## 2013-10-01 LAB — COMPREHENSIVE METABOLIC PANEL
ALT: 34 U/L (ref 0–50)
AST: 41 U/L (ref 0–50)
Albumin: 4.6 g/dL (ref 3.5–5.2)
Alk Phos: 115 U/L (ref 40–130)
Anion Gap: 12 (ref 7–16)
Bilirubin,Total: 0.3 mg/dL (ref 0.0–1.2)
CO2: 28 mmol/L (ref 20–28)
Calcium: 9.3 mg/dL (ref 9.0–10.3)
Chloride: 101 mmol/L (ref 96–108)
Creatinine: 0.76 mg/dL (ref 0.67–1.17)
GFR,Black: 134 *
GFR,Caucasian: 116 *
Glucose: 77 mg/dL (ref 60–99)
Lab: 6 mg/dL (ref 6–20)
Potassium: 4.3 mmol/L (ref 3.3–5.1)
Sodium: 141 mmol/L (ref 133–145)
Total Protein: 7.9 g/dL — ABNORMAL HIGH (ref 6.3–7.7)

## 2013-10-01 LAB — CBC AND DIFFERENTIAL
Baso # K/uL: 0 10*3/uL (ref 0.0–0.1)
Basophil %: 0.3 % (ref 0.2–1.2)
Eos # K/uL: 0.2 10*3/uL (ref 0.0–0.5)
Eosinophil %: 3.5 % (ref 0.8–7.0)
Hematocrit: 45 % (ref 40–51)
Hemoglobin: 15.4 g/dL (ref 13.7–17.5)
Lymph # K/uL: 1.3 10*3/uL (ref 1.3–3.6)
Lymphocyte %: 21.3 % — ABNORMAL LOW (ref 21.8–53.1)
MCH: 34 pg/cell — ABNORMAL HIGH (ref 26–32)
MCHC: 34 g/dL (ref 32–37)
MCV: 99 fL — ABNORMAL HIGH (ref 79–92)
Mono # K/uL: 0.6 10*3/uL (ref 0.3–0.8)
Monocyte %: 10 % (ref 5.3–12.2)
Neut # K/uL: 3.9 10*3/uL (ref 1.8–5.4)
Platelets: 222 10*3/uL (ref 150–330)
RBC: 4.5 MIL/uL — ABNORMAL LOW (ref 4.6–6.1)
RDW: 12.4 % (ref 11.6–14.4)
Seg Neut %: 64.9 % (ref 34.0–67.9)
WBC: 6 10*3/uL (ref 4.2–9.1)

## 2013-10-02 LAB — LYMPHOCYTE SUBSET (T & B CELLS)
B LYM #(CD19): 103 cells/uL — ABNORMAL LOW (ref 120–725)
B LYM %(CD19): 8 % (ref 7–36)
CD4#: 161 cells/uL — ABNORMAL LOW (ref 496–2186)
CD4%: 12 % — ABNORMAL LOW (ref 32–71)
CD4/CD8: 0.2 — ABNORMAL LOW (ref 0.7–3.0)
CD8#: 663 cells/uL (ref 177–1137)
NK LYM #(CD16+56): 318 cells/uL (ref 37–758)
NK LYM %(CD16+56): 24 % (ref 4–26)
T LYM #(CD3): 907 cells/uL (ref 754–2810)
T LYM %(CD3): 67 % (ref 54–87)
T Suppress %(CD8): 49 % — ABNORMAL HIGH (ref 10–38)

## 2013-10-05 LAB — HIV VIRAL LOAD: HIV-1 PCR: NEGATIVE copy/mL

## 2013-10-07 ENCOUNTER — Ambulatory Visit: Payer: Self-pay | Admitting: Otolaryngology

## 2014-03-08 ENCOUNTER — Ambulatory Visit
Admit: 2014-03-08 | Discharge: 2014-03-08 | Disposition: A | Discharge status: Home / Self Care | Payer: Self-pay | Source: Ambulatory Visit | Attending: Infectious Diseases | Admitting: Infectious Diseases

## 2014-03-08 LAB — CBC AND DIFFERENTIAL
Baso # K/uL: 0 10*3/uL (ref 0.0–0.1)
Basophil %: 0.4 % (ref 0.2–1.2)
Eos # K/uL: 0.1 10*3/uL (ref 0.0–0.5)
Eosinophil %: 2 % (ref 0.8–7.0)
Hematocrit: 45 % (ref 40–51)
Hemoglobin: 15.5 g/dL (ref 13.7–17.5)
Lymph # K/uL: 1.1 10*3/uL — ABNORMAL LOW (ref 1.3–3.6)
Lymphocyte %: 16.1 % — ABNORMAL LOW (ref 21.8–53.1)
MCH: 34 pg/cell — ABNORMAL HIGH (ref 26–32)
MCHC: 34 g/dL (ref 32–37)
MCV: 99 fL — ABNORMAL HIGH (ref 79–92)
Mono # K/uL: 0.8 10*3/uL (ref 0.3–0.8)
Monocyte %: 11.7 % (ref 5.3–12.2)
Neut # K/uL: 4.9 10*3/uL (ref 1.8–5.4)
Platelets: 187 10*3/uL (ref 150–330)
RBC: 4.6 MIL/uL (ref 4.6–6.1)
RDW: 12.9 % (ref 11.6–14.4)
Seg Neut %: 69.8 % — ABNORMAL HIGH (ref 34.0–67.9)
WBC: 7 10*3/uL (ref 4.2–9.1)

## 2014-03-08 LAB — COMPREHENSIVE METABOLIC PANEL
ALT: 164 U/L — ABNORMAL HIGH (ref 0–50)
AST: 178 U/L — ABNORMAL HIGH (ref 0–50)
Albumin: 4.9 g/dL (ref 3.5–5.2)
Alk Phos: 118 U/L (ref 40–130)
Anion Gap: 18 — ABNORMAL HIGH (ref 7–16)
Bilirubin,Total: 0.9 mg/dL (ref 0.0–1.2)
CO2: 26 mmol/L (ref 20–28)
Calcium: 9.5 mg/dL (ref 9.0–10.3)
Chloride: 97 mmol/L (ref 96–108)
Creatinine: 0.78 mg/dL (ref 0.67–1.17)
GFR,Black: 132 *
GFR,Caucasian: 114 *
Glucose: 78 mg/dL (ref 60–99)
Lab: 6 mg/dL (ref 6–20)
Potassium: 4.2 mmol/L (ref 3.3–5.1)
Sodium: 141 mmol/L (ref 133–145)
Total Protein: 8.1 g/dL — ABNORMAL HIGH (ref 6.3–7.7)

## 2014-03-09 LAB — SYPHILIS SCREEN
Syphilis Screen: NEGATIVE
Syphilis Status: NONREACTIVE

## 2014-03-09 LAB — LYMPHOCYTE SUBSET (T & B CELLS)
B LYM #(CD19): 80 cells/uL — ABNORMAL LOW (ref 120–725)
B LYM %(CD19): 7 % (ref 7–36)
CD4#: 141 cells/uL — ABNORMAL LOW (ref 496–2186)
CD4%: 12 % — ABNORMAL LOW (ref 32–71)
CD4/CD8: 0.3 — ABNORMAL LOW (ref 0.7–3.0)
CD8#: 537 cells/uL (ref 177–1137)
NK LYM #(CD16+56): 340 cells/uL (ref 37–758)
NK LYM %(CD16+56): 29 % — ABNORMAL HIGH (ref 4–26)
T LYM #(CD3): 721 cells/uL — ABNORMAL LOW (ref 754–2810)
T LYM %(CD3): 62 % (ref 54–87)
T Suppress %(CD8): 46 % — ABNORMAL HIGH (ref 10–38)

## 2014-03-11 LAB — HIV VIRAL LOAD: HIV-1 PCR: NEGATIVE copy/mL

## 2014-06-07 ENCOUNTER — Encounter: Payer: Self-pay | Admitting: Gastroenterology

## 2014-06-07 ENCOUNTER — Ambulatory Visit
Admit: 2014-06-07 | Discharge: 2014-06-07 | Disposition: A | Discharge status: Home / Self Care | Payer: Self-pay | Source: Ambulatory Visit | Attending: Infectious Diseases | Admitting: Infectious Diseases

## 2014-06-29 ENCOUNTER — Encounter: Payer: Self-pay | Admitting: Gastroenterology

## 2014-07-01 ENCOUNTER — Ambulatory Visit
Admit: 2014-07-01 | Discharge: 2014-07-01 | Disposition: A | Discharge status: Home / Self Care | Payer: Self-pay | Source: Ambulatory Visit | Attending: Infectious Diseases | Admitting: Infectious Diseases

## 2014-07-01 LAB — CBC AND DIFFERENTIAL
Baso # K/uL: 0 10*3/uL (ref 0.0–0.1)
Basophil %: 0.7 %
Eos # K/uL: 0.2 10*3/uL (ref 0.0–0.5)
Eosinophil %: 3.2 %
Hematocrit: 42 % (ref 40–51)
Hemoglobin: 14.8 g/dL (ref 13.7–17.5)
IMM Granulocytes #: 0 10*3/uL (ref 0.0–0.1)
IMM Granulocytes: 0.3 %
Lymph # K/uL: 1.2 10*3/uL — ABNORMAL LOW (ref 1.3–3.6)
Lymphocyte %: 19.6 %
MCH: 34 pg/cell — ABNORMAL HIGH (ref 26–32)
MCHC: 35 g/dL (ref 32–37)
MCV: 99 fL — ABNORMAL HIGH (ref 79–92)
Mono # K/uL: 0.8 10*3/uL (ref 0.3–0.8)
Monocyte %: 12.7 %
Neut # K/uL: 3.8 10*3/uL (ref 1.8–5.4)
Nucl RBC # K/uL: 0 10*3/uL (ref 0.0–0.0)
Nucl RBC %: 0 /100 WBC (ref 0.0–0.2)
Platelets: 154 10*3/uL (ref 150–330)
RBC: 4.3 MIL/uL — ABNORMAL LOW (ref 4.6–6.1)
RDW: 12 % (ref 11.6–14.4)
Seg Neut %: 63.5 %
WBC: 6 10*3/uL (ref 4.2–9.1)

## 2014-07-01 LAB — COMPREHENSIVE METABOLIC PANEL
ALT: 90 U/L — ABNORMAL HIGH (ref 0–50)
AST: 87 U/L — ABNORMAL HIGH (ref 0–50)
Albumin: 4.7 g/dL (ref 3.5–5.2)
Alk Phos: 105 U/L (ref 40–130)
Anion Gap: 14 (ref 7–16)
Bilirubin,Total: 1.1 mg/dL (ref 0.0–1.2)
CO2: 26 mmol/L (ref 20–28)
Calcium: 9.3 mg/dL (ref 9.0–10.3)
Chloride: 99 mmol/L (ref 96–108)
Creatinine: 0.79 mg/dL (ref 0.67–1.17)
GFR,Black: 131 *
GFR,Caucasian: 113 *
Glucose: 101 mg/dL — ABNORMAL HIGH (ref 60–99)
Lab: 7 mg/dL (ref 6–20)
Potassium: 3.8 mmol/L (ref 3.3–5.1)
Sodium: 139 mmol/L (ref 133–145)
Total Protein: 7.8 g/dL — ABNORMAL HIGH (ref 6.3–7.7)

## 2014-07-02 LAB — LYMPHOCYTE SUBSET (T & B CELLS)
B LYM #(CD19): 98 cells/uL — ABNORMAL LOW (ref 120–725)
B LYM %(CD19): 8 % (ref 7–36)
CD4#: 203 cells/uL — ABNORMAL LOW (ref 496–2186)
CD4%: 17 % — ABNORMAL LOW (ref 32–71)
CD4/CD8: 0.4 — ABNORMAL LOW (ref 0.7–3.0)
CD8#: 545 cells/uL (ref 177–1137)
NK LYM #(CD16+56): 263 cells/uL (ref 37–758)
NK LYM %(CD16+56): 22 % (ref 4–26)
T LYM #(CD3): 806 cells/uL (ref 754–2810)
T LYM %(CD3): 67 % (ref 54–87)
T Suppress %(CD8): 46 % — ABNORMAL HIGH (ref 10–38)

## 2014-07-02 LAB — HEPATITIS C ANTIBODY: Hep C Ab: NEGATIVE

## 2014-07-04 LAB — HIV VIRAL LOAD
HIV-1 PCR log: NEGATIVE copies/mL
HIV-1 PCR: NEGATIVE copies/mL

## 2014-07-04 LAB — CHLAMYDIA PLASMID DNA AMPLIFICATION: Chlamydia Plasmid DNA Amplification: 0

## 2014-07-04 LAB — N. GONORRHOEAE DNA AMPLIFICATION: N. gonorrhoeae DNA Amplification: 0

## 2014-07-06 LAB — TB AG T-CELL STIMULATION: TB Ag T-Cell Stimulation: 0

## 2014-07-11 ENCOUNTER — Encounter: Payer: Self-pay | Admitting: Gastroenterology

## 2014-07-11 ENCOUNTER — Encounter: Payer: Self-pay | Admitting: Orthopedic Surgery

## 2014-07-11 ENCOUNTER — Other Ambulatory Visit: Payer: Self-pay | Admitting: Orthopedic Surgery

## 2014-07-11 ENCOUNTER — Ambulatory Visit: Payer: Self-pay | Admitting: Orthopedic Surgery

## 2014-07-11 VITALS — BP 108/65 | Ht 71.0 in | Wt 155.0 lb

## 2014-07-11 DIAGNOSIS — G5730 Lesion of lateral popliteal nerve, unspecified lower limb: Secondary | ICD-10-CM

## 2014-07-11 MED ORDER — GABAPENTIN 300 MG PO CAPSULE *I*
300.0000 mg | ORAL_CAPSULE | Freq: Three times a day (TID) | ORAL | Status: DC
Start: 2014-07-11 — End: 2015-06-15
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
## 2014-07-11 NOTE — Progress Notes (Signed)
This office note has been dictated. [id number]

## 2014-07-25 ENCOUNTER — Ambulatory Visit: Payer: Self-pay | Admitting: Orthopedic Surgery

## 2014-07-26 ENCOUNTER — Encounter: Payer: Self-pay | Admitting: Orthopedic Surgery

## 2014-12-07 NOTE — Progress Notes (Signed)
IMMEDIATE NEEDS  Client is isolated in his home with limited supports due the family dynamics at this time.  Client is not connecting to preventative medical care due to his symptoms of paranoia.  He has basic needs taken care of by family but is in need of push in services to address linkage to transportation and medical care.

## 2014-12-08 ENCOUNTER — Ambulatory Visit: Payer: Self-pay

## 2014-12-08 NOTE — Progress Notes (Signed)
Clinical Management Note     Derek MutterAbigail Walker, ACT Therapist and writer attempted unsuccessfully to confirm a [facility] to [facility] ct for ACT pre-screening and initial engagement.  Derek following up on arranging another date/time as soon as possible.

## 2014-12-14 ENCOUNTER — Ambulatory Visit: Payer: Self-pay

## 2014-12-14 DIAGNOSIS — F32A Depression, unspecified: Secondary | ICD-10-CM
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
## 2014-12-19 ENCOUNTER — Ambulatory Visit: Payer: Self-pay

## 2014-12-20 ENCOUNTER — Ambulatory Visit: Payer: Self-pay
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
## 2014-12-26 ENCOUNTER — Telehealth: Payer: Self-pay

## 2014-12-26 NOTE — Telephone Encounter (Addendum)
Clinical Management Note   12/23/14  PC to Derek Walker to confirm date/time of [facility] with MD and writer.  He voiced no new concern.  Writer also left vm for AetnaJonathon Walker, [facility] CM at [facility] regarding housing.      Addendum:  VM left as well for referral source [facility], requesting call back.  Writer attempting to find out more about steps that have already been taken in terms of housing, as well as ask further about treatment history (i.e. How well staff felt they knew Derek Walker clinically).

## 2014-12-29 ENCOUNTER — Telehealth: Payer: Self-pay

## 2014-12-29 NOTE — Telephone Encounter (Signed)
Clinical Management Note     12/23/14    From: Maxie BetterWeisman, Robert   Sent: Friday, December 23, 2014 8:09 PM  To: Tyronica Truxillo, Delaney Meigsamara  Subject: Re: new ct, psychpharm Monday    Very good.     Thanks, Tammie.     Have a good weekend!    Robert L. [facility],DO  Associate Professor, Psychiatry  [place] [facility] [place]  [facility]   [address]  [place], [place] [id number]    [phone] o  [phone] f    On Dec 23, 2014, at 3:15 PM, Sheneka Schrom, Delaney Meigsamara @[email or web address] wrote:  Hi, Rob.  Lavenia Atlasve got [name] Karpowicz (MRN [id number]) coming in on Monday at 10 AM to see you for psychopharm and I will join, because Lavenia Atlasve got more of the intake forms to do with him.  It was not safe to see him by myself earlier this week, then Himler and I went out together on the 5th.  He was intoxicated.  I have some concerns about how well [facility] is going to be able to effectively provide treatment.  Finding him wont be a problem, but Im more concerned about unwillingness to treat what sounds like severe alcohol abuse and safety issues.   Would like to give it a try, but I dont know.       My intake is in the chart for you to cosign.  It was pretty thorough.  Not sure if youd get a chance to look at it prior to Monday, but it would be helpful.  Hopefully, he comes in on the cab.  I plan to call Monday morning to try to remind him to get on it.  He said the AM worked for him.       Thanks!  Have a good weekend        Ardelle Parkamara Ilze Roselli, MA, MS, [facility]  Rehabilitation Therapist  [facility]  [facility]  [phone] [place], [place] [address]  [phone]     Fax [phone]  [email or web address]
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
## 2015-01-02 ENCOUNTER — Ambulatory Visit: Payer: Self-pay

## 2015-01-03 NOTE — Progress Notes (Signed)
[facility] MISSED/CANCELLED APPOINTMENT     Name: Derek NielsenRALPH D Walker  MRN: [id number]   DOB: 10-06-1975    Date of Scheduled Service: 01/02/15    Mr. Geurin was a no show for today's appointment.  I have left the patient a message to contact me.    Additional Information:    Writer also attempted a text (with no identifying/PHI).  This was in case Derek Walker had no minutes on his phone.  Writer and [facility] counselor, Himler arrived at his [address] on [address] about 15 minutes behind schedule.  I was not able to reach him to let him know.  Write rec'd a call back earlier in the day form CM at [facility], [place].  He reported he was going to try to see ct later today.  He would like to talk with him in person to ensure himself that Derek Walker is aware of what transferring his care to aCT will mean.  Derek Walker said it was surprising he spoke negatively of [facility], when Derek Walker presents the opposite with them.  Derek RuizJohn is quite certain they will not close until May.  He agreed to Regulatory affairs officercall writer back.  He said Derek Walker [facility].  Writer [facility]oned [name] [place], but they did not have him on the calendar.  Writer unaware if he might have gone to another location.  It would be highly unlikely based on history he kept the appointment, but is possible.      Writer to re-attempt ct by phone tomorrow, as well as send a letter.

## 2015-01-06 ENCOUNTER — Telehealth: Payer: Self-pay

## 2015-01-06 NOTE — Telephone Encounter (Signed)
Clinical Management Note     01/03/15  Writer left vm for [employer].  Writer also left vm for Derek Walker at [facility] [phone], [phone]).  Letter mailed (see under letter tab).  [facility] continues to attempt to find out if Derek Walker wants to continue with our services, or remain with care coordination at [facility].

## 2015-01-11 ENCOUNTER — Ambulatory Visit: Payer: Self-pay

## 2015-01-11 DIAGNOSIS — F32A Depression, unspecified: Secondary | ICD-10-CM

## 2015-01-12 NOTE — Progress Notes (Signed)
Behavioral Health Progress Note     LENGTH OF SESSION: 60 minutes    Contact Type:  Location: Off Site    Direct     Problem(s)/Goals Addressed from Treatment Plan:    N/a- new plan    Mental Status Exam:  APPEARANCE: Well-groomed  ATTITUDE TOWARD INTERVIEWER: Cooperative  MOTOR ACTIVITY: WNL (within normal limits)  EYE CONTACT: Direct  SPEECH: Normal rate and tone  AFFECT: Constricted  MOOD: Anxious and Depressed  THOUGHT PROCESS: Intact  THOUGHT CONTENT: No unusual themes  PERCEPTION: Within normal limits  ORIENTATION: Alert and Oriented X 3.  CONCENTRATION: Fair  MEMORY:   Recent: impaired recent memory   Remote: did not assess, but appears intact  COGNITIVE FUNCTION: Average intelligence  JUDGMENT: Impaired -  mild  IMPULSE CONTROL: Fair  INSIGHT: Fair    Risk Assessment:  ASSESSMENT OF RISK FOR SUICIDAL BEHAVIOR  Changes in risk for suicide from baseline Formulation of Risk and/or previous intake, including newly identified risk, if any: none  Violence risk was assessed and No Change noted from baseline formulation of risk and/or previous assessment.      Session Content::  Writer met with Sascha at the [employer] to complete the intake process, filling out various forms.  He was there and waiting.  Thoughts were logical and organized.  He presented sober.  Mood/affect depressed, but not acute today.  Anxiety symptoms unchanged. He self-medicates symptoms with alcohol.  No signs of psychosis.  Appetite remains poor.      We also began to discuss specific treatment plan goals.  Writer to type them up and show to Javar the next time we meet, again at [employer] 5/3.  He has [facility] numbers, including the after hours line.  He reported no concerns about his safety in the house, but does not feel safe going out.      Writer gave him apartment listings and an application to look at.  States he reads ok.  Writer advised once we get him enrolled in the health home, we can access wrap around funds for a security  deposit.  When writer sees him 5/3, we will make a plan to go look at apartments ASAP.  Writer to also make phone contact with Almyra Free in the housing support program at [employer].      Clif said he has enough meds.  Both are sedating and he's not sure if either helps (Paxil and Zyprexa).  He will pick up refills tomorrow.  Writer spoke with Dr. Redmond School and there is a plan for him to see Kimberly 5/9.  Dr. Redmond School not with [place] 5/2.        Interventions:    engagement, care coordination, supportive therapy    Plan:  Other planned interventions or recommendations: see above    NEXT APPT: 5/3      Jonelle Sidle Rai Sinagra
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
## 2015-01-17 ENCOUNTER — Ambulatory Visit: Payer: Self-pay

## 2015-01-17 NOTE — BH Treatment Plan (Signed)
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    PROBLEM AREAS (choose and describe relevant):  THOUGHT: negative ruminations, thoughts of paranoia, difficulty with concentration and attention, S/I  MOOD:major depression and anxiety  BEHAVIOR: poor follow through with treatment, by history, history of antisocial and high risk behaviors, limited activity  SOCIAL:relatively reclusive, avoids going out of the house  LEGAL:none current, but history of extensive incarceration  HOUSING:homeless, staying temporarily with ex-wife and kids; history of unstable housing at least X past 4 years (close to time of prison release)  ECONOMIC:receives DHS; pursuing appeal for SSI denial  ________________________________________________________________           The rationale for addressing this problem is that resolving it will (select all that apply):  Reduce symptoms of disorder, Reduce functional impairment associated with disorder, Facilitate transfer skills learned in therapy to everday life and Is a key motivational factor for the patient's participation in treatment    #1  I WILL GET MY OWN APARTMENT ASAP.  Progress toward goal(s): N/A - New Goal    1 a. Measurable Objectives :  I will follow through on use of services  through [employer] housing support [name]([name]).     Date established: 01/11/15   Target date: 04/12/15   Attained or Revised? new    1 b. Measurable Objectives : I will actively work on steps each week:  Go through apartment listings, make phone calls, tour places, fill out applications, etc.     Date established: 01/11/15   Target date: 04/12/15   Attained or Revised? new    1 c. Measurable Objectives : I will meet weekly with ACT staff to receive assistance with objective "b", security deposit and negotiating with landlords.     Date established: 01/11/15   Target date: 04/12/15   Attained or Revised? New    INTERVENTIONS:  1.  ACT will access wrap around funds for a security deposit and/or immediate housing needs on a one time basis.  2.  ACT team will provide assistance/teaching/coaching on finding safe, affordable housing, including:  Researching/locating suitable locations based on needs/preferences, negotiating leases and paying rent, purchasing household necessities and developing relationships with landlords, etc.          #2  I WILL APPLY TO GO BACK TO SCHOOL WITHIN SIX MONTHS.    The rationale for addressing this problem is that resolving it will (select all that apply):  Reduce symptoms of disorder, Reduce functional impairment associated with disorder and Facilitate transfer skills learned in therapy to everday life    Progress toward goal(s): N/A - New Goal    2 a. Measurable Objectives : I will stablize housing and other basic needs.     Date established: 01/11/15   Target date: 04/12/15   Attained or Revised?  new    2 b. Measurable Objectives : I will explore options, such as OACES or REOC, getting information I need.     Date established: 01/11/15   Target date: 07/13/15   Attained or Revised? new    2 c. Measurable Objectives : I will break down a plan into manageable next steps .   Date established: 01/11/15   Target date: 07/13/15   Attained or Revised? New    2 d. Measurable Objectives: I will  identify skills I will need to be ready for school and get help if needed.     Date established: 01/11/15   Target date: 07/13/15   Attained or Revised? New    INTERVENTIONS:  1.  ACT will provide information, linkage and referral to educational programs/resources as needed.    2.  ACT will provide coaching/counseling to plan and carry out steps.    3.  ACT vocational specialist and/or staff will provide educational/career counseling as needed.          .........................................................................................................................................Marland Kitchen        #3  I WILL GET MY HEALTH BACK IN ORDER (nutrition, weight, exercise).    The rationale for addressing this problem is that resolving it will (select all that apply):  Reduce symptoms of disorder, Reduce functional impairment associated with disorder and Is a key motivational factor for the patient's participation in treatment    Progress toward goal(s): N/A - New Goal    3 a. Measurable Objectives : I will make a plan for improving my nutrition to gain weight and promote health.   Date established: 01/11/15   Target date: 07/13/15   Attained or Revised? new    3 b. Measurable Objectives :  I will exercise at least 3X per week (e.g. Join gym).     Date established: 01/11/15   Target date: 07/13/15   Attained or Revised? new    3 c. Measurable Objectives : I will meet with the ACT MICA specialist for assessment/counseling on readiness for change and/or a plan of recovery.     Date established: 01/11/15   Target date: 07/13/15   Attained or Revised? new    3  c. Measurable Objectives : I will learn 2-3 new coping skills (for dealing with pain, anxiety, depression and/or poor sleep, practicing one skill per week.     Date established: 01/11/15   Target date: 04/12/15   Attained or Revised? New    3 d. Measurable Objectives:  I will be consistent in keeping appointments for physical and mental health care.     Date established:  01/11/15   Target date: 04/12/15   Attained or Revised? New    3. e. Measurable Obejctives:  I will take medications as prescribed, communicating promptly with providers if I have a concerns/question.     Date established: 01/11/15   Target date: 04/12/15   Attained or Revised? New    INTERVENTIONS:  1.  ACT RN's or staff will provide basic nutritional counseling and/or refer to an RD/other nutrition education in community.    2.  ACT may access wrap funds to assist ct in obtaining a gym membership and work out [employer] fast streamer.    3.  MICA specialist will provide assessment and SAS counseling as needed/monthly.    4.  ACT therapist/staff will provide psychoeducation and skills training to develop coping skills.    5.  ACT therapist will provide weekly psychotherapy, including motivational interviewing, CBT, relapse prevention, etc.  6.  ACT will assist ct in coordinating care, such as making/keeping appointments, securing transportation, etc. To medical care.    7.  ACT staff will collaborate with other providers for continuity of care as needed.    8. ACT RN's/staff will provide weekly or at least monthly medication support and monitoring.            The rationale for addressing this problem is that resolving it will (select all that apply):  Reduce symptoms of disorder    Progress toward goal(s):  n/a    4 a. Measurable Objectives : n/a   Date established: n/a   Target date: n/a   Attained or Revised? new    4 b. Measurable Objectives : n/a   Date established: n/a   Target date: n/a   Attained or Revised? Revised as follows: n/a    4 c. Measurable Objectives : n/a   Date established: n/a   Target date: n/a   Attained or Revised? new    .........................................................................................................................................Marland Kitchen               The rationale for addressing this problem is that resolving it will (select all that apply):  Reduce symptoms of disorder    Progress  toward goal(s): N/A - Initial plan    5 a. Measurable Objectives : n/a   Date established: n/a   Target date: n/a   Attained or Revised? new    5 b. Measurable Objectives : n/a   Date established: n/a   Target date: n/a   Attained or Revised? new    5 c. Measurable Objectives : n/a   Date established: n/a   Target date: n/a   Attained or Revised? new               ______________________________________________________________________      Plan  TREATMENT MODALITIES:  Individual psychotherapy for 30-60 min Q weely with ACT therapist.  Psychopharmacology visits 30-45 min Q once/month with provider.  Other: care coordination 1-2 X per week     DISCHARGE CRITERIA for this treatment setting: When ct no longer requires the ACT level of care, as demonstrated by a reduction in ED visits and hospitalizations and independence sufficient enough to maintain safe housing at a lower level of service (e.g. clinic), and/or until she requests transfer referral to another provider.      Clinician's name: Ardelle Park    Psychiatrist's Name: Dr. Maxie Better, D.O.    Supervisor's Name: n/a    Patient/Family Statement  PATIENT/FAMILY STATEMENT:  Obtain patient and family input into the treatment plan, including areas of agreement / disagreement.  Obtain patient's signature - if not possible, briefly describe the reason.     Patient Comments:          I HAVE PARTICIPATED IN THE DEVELOPMENT OF THIS TREATMENT PLAN AND I AGREE WITH ITS CONTENTS:       Patient Signature: _______________________________________________________    Date: ______________________________

## 2015-01-18 ENCOUNTER — Ambulatory Visit: Payer: Self-pay

## 2015-01-18 NOTE — Progress Notes (Signed)
Clinical Management Note     01/17/15  [facility] [address] to see client 15 mins late.  He had left by then.  I phoned and he was at home.  Education officer, communityWriter apologized.  Ct stated he'd "be right there", but by 20 minutes later, never showed.  It is about 1/4 mile distance from his home.  Writer drove by the house and left a note in the door (no PHI) with apartment listings and asking for a call back to reschedule tomorrow.  Writer tried again later by phone, but was unable to reach ct.

## 2015-01-19 ENCOUNTER — Ambulatory Visit: Payer: Self-pay
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
## 2015-01-20 NOTE — Progress Notes (Signed)
[facility] MISSED/CANCELLED APPOINTMENT     Name: Derek NielsenRALPH D Mcweeney  MRN: [id number]   DOB: 1976-08-11    Date of Scheduled Service: 01/19/15   Mr. Ardito was a no show for today's appointment.  I have left a message for him.      Additional Information:    Not applicable

## 2015-01-23 ENCOUNTER — Ambulatory Visit: Payer: Self-pay | Admitting: Psychiatry

## 2015-01-23 ENCOUNTER — Encounter: Payer: Self-pay | Admitting: Psychiatry

## 2015-01-23 DIAGNOSIS — F32A Depression, unspecified: Secondary | ICD-10-CM

## 2015-01-30 ENCOUNTER — Ambulatory Visit: Payer: Self-pay

## 2015-01-30 ENCOUNTER — Telehealth: Payer: Self-pay

## 2015-01-30 DIAGNOSIS — F32A Depression, unspecified: Secondary | ICD-10-CM

## 2015-01-30 NOTE — Telephone Encounter (Signed)
[facility] Telephone Call     Date of call: 01/26/15    Name: Derek NielsenRalph D Walker   DOB: 03-31-1976   MRN: [id number]     Derek SextonRalph phoned from his mother's [phone]), apologizing for not keeping his visits.  Rescheduled for 01/30/15 12:30 [facility].

## 2015-01-31 NOTE — Progress Notes (Signed)
Behavioral Health Progress Note     LENGTH OF SESSION: 50 minutes    Contact Type:  Location: Off Site    Direct     Problem(s)/Goals Addressed from Treatment Plan:    All goals reviewed    Mental Status Exam:  APPEARANCE: Well-groomed  ATTITUDE TOWARD INTERVIEWER: Cooperative  MOTOR ACTIVITY: WNL (within normal limits)  EYE CONTACT: Direct  SPEECH: Normal rate and tone  AFFECT: Full Range  MOOD: Neutral  THOUGHT PROCESS: Intact  THOUGHT CONTENT: No unusual themes  PERCEPTION: No evidence of hallucinations  ORIENTATION: Alert and Oriented X 3.  CONCENTRATION: WNL  MEMORY:   Recent: intact   Remote: intact  COGNITIVE FUNCTION: Average intelligence  JUDGMENT: Impaired -  mild  IMPULSE CONTROL: Fair  INSIGHT: Fair    Risk Assessment:  ASSESSMENT OF RISK FOR SUICIDAL BEHAVIOR  Changes in risk for suicide from baseline Formulation of Risk and/or previous intake, including newly identified risk, if any: none  Violence risk was assessed and No Change noted from baseline formulation of risk and/or previous assessment.      Session Content::  [name] [facility]  We worked on a number of items:    1) reviewed the written treatment plan and Quality of Life assessment (see under media).    2) completed a safety plan (see media)  3) reviewed immediate needs, including telephone and clothing.  ACT to access trac phone with program funds, hopefully for next visit.  Writer to talk with other ACT staff about accompanying Derek Walker to the store for basic clothing needs.  4) housing- [name] gave him an Landscape architectapplication for [facility] [employer], he signed a release for [employer] and we reviewed next steps.  5) discussed need for psychopharm visit- writer to attempt to schedule before our next meeting, 5/18 so that I can give Derek Walker the date.  Meanwhile, he has a sufficient supply of meds and reports to be taking them as prescribed.    6) When Claudina LickLou G. Returns from vacation, [name] will schedule him for a MICA assessment.  We  discussed what this means.  Derek Walker is receptive.      His ex-wife and 3 y.o. Daughter were also at [facility] in a different area.  They were going to be taking the child to the dentis together.  For now, all appeared calm and Derek Walker reported no new concerns/issues.      Interventions:    Treatment Planning, engagement, asessment    Plan:  Psychotherapy continues as described in care plan; plan remains the same.    NEXT APPT: 5/18      Delaney Meigsamara Alesandro Stueve

## 2015-02-01 ENCOUNTER — Ambulatory Visit: Payer: Self-pay

## 2015-02-02 NOTE — Progress Notes (Signed)
[facility] MISSED/CANCELLED APPOINTMENT     Name: Derek Walker  MRN: [id number]   DOB: 15-Aug-1976    Date of Scheduled Service: 02/01/15   [name] was a no show for today's appointment.  I have have not been able to reach him, but will try his mother's home tomorrow.      Additional Information:    Not applicable

## 2015-02-06 NOTE — Progress Notes (Signed)
Clinical Management Note   Writer attempted to reach Derek Walker at his mother's home eon 5/20.  No answer and writer did not want to leave a message d/t HIPPA concerns.   Writer rec'd vm from [place], around 10:30 am today.  He indicated [name] did not keep the appointment with him on 5/18.  This is not the case.  Please refer to letter writer mailed out today to him, under media.  Writer was again not able to reach him at his mother's when I had a chance to try in the afternoon. Writer will re-attempt when I am back in the office 5/25.
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
## 2015-02-15 ENCOUNTER — Telehealth: Payer: Self-pay

## 2015-02-15 ENCOUNTER — Ambulatory Visit: Payer: Self-pay

## 2015-02-15 NOTE — Telephone Encounter (Addendum)
Clinical Management Note     02/14/15  PC from [place], stating he is "hanging in there".  He is currently; staying at his mother's.  He acknowledged he was close to his breaking point when he phoned the ACT emergency hone this weekend.  He does not feel he got a response.  According to on-call staff, he could not reach him back.  (see Posey ProntoF. Hernandez on-call note 5/30).  He received writer's letter today (see under letters).  We [facility].  States he will wait 15 mins if writer unable to get there right on time.  Priorities are getting a track phone and following up on [facility] application, which he says he completed.  Writer to also ch eck in to [facility], but he may not meeting homeless criteria.  [name] reports Raynelle FanningJulie at [employer] closed his case.  Writer never heard from her.      Addendum:  We also discussed priority of scheduling a psychopharm.  Writer will try for 6/6 with Dr. Melchor AmourWeisman.  [name] denied he was not at [facility] for their last scheduled visit and stated he wouldn't know what Dr. Melchor AmourWeisman looked like.  Writer to attempt to attend first appointment with him.  [name] said he will be reading the paper at the table int he back of the library [facility]

## 2015-02-16 ENCOUNTER — Ambulatory Visit: Payer: Self-pay

## 2015-02-20 NOTE — Progress Notes (Signed)
[facility] MISSED/CANCELLED APPOINTMENT     Name: Derek NielsenRALPH D Walker  MRN: [id number]   DOB: 1976-04-05    Date of Scheduled Service: 02/16/15    [name] was a no show for today's appointment.   [facility] time and looked around for him throughout the spcae.  Writer waited until 15 minutes past the appointment time.  Writer will update ACT team and send [name] a letter at his mother's address, warning him of the need to meet with us in order to for services to be provided.    Additional Information:    Not applicable

## 2015-02-24 NOTE — Progress Notes (Deleted)
Telephone Contact w/ client.    Attempted home visit for Derek Walker, but he was not home. Called his listed number and he answered. Reported he was "out", but doing well and denied any duress or distress. Writer inquired to see if he could meet, but requested another time for home visit.   He reported no lethality or safety concerns and was polite during the telephone contact today. He did not sound psychotic or intoxicated and was calm in all responses.    Will reschedule home visit for reattempt within one week by ACT team.    Client agreed with reschedule and to call prn.

## 2015-02-27 NOTE — Progress Notes (Signed)
Clinical Management Note     Writer rec'd vm from [facility] last week, the same date the most recent letter was sent (see under letters).  Writer tried him by phone on 6/10, but could not get through.  He phoned again this morning.  We scheduled a visit at the library 6/14.  Writer advised him of the contents of the letter.  If Derek Walker is not willing or able to work out some type of plan with ACT to keep visits, we will not be able to provide service.  He indicated he clearly understood this and is interested in services.

## 2015-02-28 ENCOUNTER — Ambulatory Visit: Payer: Self-pay

## 2015-02-28 DIAGNOSIS — F32A Depression, unspecified: Secondary | ICD-10-CM

## 2015-03-01 ENCOUNTER — Ambulatory Visit: Payer: Self-pay
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
## 2015-03-03 ENCOUNTER — Ambulatory Visit
Admit: 2015-03-03 | Discharge: 2015-03-03 | Disposition: A | Discharge status: Home / Self Care | Payer: Self-pay | Source: Ambulatory Visit | Attending: Infectious Diseases | Admitting: Infectious Diseases

## 2015-03-03 ENCOUNTER — Encounter: Payer: Self-pay | Admitting: Gastroenterology

## 2015-03-03 LAB — CBC AND DIFFERENTIAL
Baso # K/uL: 0 10*3/uL (ref 0.0–0.1)
Basophil %: 0.8 %
Eos # K/uL: 0.1 10*3/uL (ref 0.0–0.5)
Eosinophil %: 2.4 %
Hematocrit: 43 % (ref 40–51)
Hemoglobin: 13.9 g/dL (ref 13.7–17.5)
IMM Granulocytes #: 0 10*3/uL (ref 0.0–0.1)
IMM Granulocytes: 0.4 %
Lymph # K/uL: 1.1 10*3/uL — ABNORMAL LOW (ref 1.3–3.6)
Lymphocyte %: 21 %
MCH: 33 pg/cell — ABNORMAL HIGH (ref 26–32)
MCHC: 33 g/dL (ref 32–37)
MCV: 102 fL — ABNORMAL HIGH (ref 79–92)
Mono # K/uL: 0.7 10*3/uL (ref 0.3–0.8)
Monocyte %: 13.5 %
Neut # K/uL: 3.1 10*3/uL (ref 1.8–5.4)
Nucl RBC # K/uL: 0 10*3/uL (ref 0.0–0.0)
Nucl RBC %: 0 /100 WBC (ref 0.0–0.2)
Platelets: 166 10*3/uL (ref 150–330)
RBC: 4.2 MIL/uL — ABNORMAL LOW (ref 4.6–6.1)
RDW: 12.7 % (ref 11.6–14.4)
Seg Neut %: 61.9 %
WBC: 5 10*3/uL (ref 4.2–9.1)

## 2015-03-03 LAB — COMPREHENSIVE METABOLIC PANEL
ALT: 66 U/L — ABNORMAL HIGH (ref 0–50)
AST: 68 U/L — ABNORMAL HIGH (ref 0–50)
Albumin: 4.7 g/dL (ref 3.5–5.2)
Alk Phos: 91 U/L (ref 40–130)
Anion Gap: 17 — ABNORMAL HIGH (ref 7–16)
Bilirubin,Total: 0.4 mg/dL (ref 0.0–1.2)
CO2: 24 mmol/L (ref 20–28)
Calcium: 8.7 mg/dL — ABNORMAL LOW (ref 9.0–10.3)
Chloride: 104 mmol/L (ref 96–108)
Creatinine: 0.67 mg/dL (ref 0.67–1.17)
GFR,Black: 140 *
GFR,Caucasian: 121 *
Glucose: 75 mg/dL (ref 60–99)
Lab: 7 mg/dL (ref 6–20)
Potassium: 4.1 mmol/L (ref 3.3–5.1)
Sodium: 145 mmol/L (ref 133–145)
Total Protein: 7.5 g/dL (ref 6.3–7.7)

## 2015-03-04 LAB — SYPHILIS SCREEN
Syphilis Screen: NEGATIVE
Syphilis Status: NONREACTIVE

## 2015-03-06 ENCOUNTER — Ambulatory Visit: Payer: Self-pay | Admitting: Psychiatry

## 2015-03-06 DIAGNOSIS — F32A Depression, unspecified: Secondary | ICD-10-CM

## 2015-03-06 LAB — LYMPHOCYTE SUBSET (T & B CELLS)
B LYM #(CD19): 73 cells/uL — ABNORMAL LOW (ref 120–725)
B LYM %(CD19): 7 % (ref 7–36)
CD4#: 186 cells/uL — ABNORMAL LOW (ref 496–2186)
CD4%: 19 % — ABNORMAL LOW (ref 32–71)
CD4/CD8: 0.4 — ABNORMAL LOW (ref 0.7–3.0)
CD8#: 510 cells/uL (ref 177–1137)
NK LYM #(CD16+56): 169 cells/uL (ref 37–758)
NK LYM %(CD16+56): 17 % (ref 4–26)
NK LYM# (CD3+16+56+): 41 cells/uL
NK LYM% (CD3+16+56+): 4 %
T LYM #(CD3): 748 cells/uL — ABNORMAL LOW (ref 754–2810)
T LYM %(CD3): 74 % (ref 54–87)
T LYM# (CD3+4+8+): 9 cells/uL
T LYM# (CD3+4-8-): 61 cells/uL
T LYM% (CD3+4+8+): 1 %
T LYM% (CD3+4-8-): 6 %
T Suppress %(CD8): 51 % — ABNORMAL HIGH (ref 10–38)

## 2015-03-06 LAB — CHLAMYDIA PLASMID DNA AMPLIFICATION: Chlamydia Plasmid DNA Amplification: 0

## 2015-03-06 LAB — N. GONORRHOEAE DNA AMPLIFICATION: N. gonorrhoeae DNA Amplification: 0

## 2015-03-07 ENCOUNTER — Ambulatory Visit: Payer: Self-pay

## 2015-03-07 LAB — HIV VIRAL LOAD
HIV-1 PCR log: NEGATIVE copies/mL
HIV-1 PCR: NEGATIVE copies/mL

## 2015-03-07 NOTE — Progress Notes (Signed)
[facility] MISSED/CANCELLED APPOINTMENT     Name: Derek Walker  MRN: [id number]   DOB: [birth year]    Date of Scheduled Service: 03/07/15    [name] was a no show for today's appointment.  I have entered the information in ACT blue book.    Additional Information:    Not applicable

## 2015-03-08 ENCOUNTER — Encounter: Payer: Self-pay | Admitting: Psychiatry
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
## 2015-03-14 ENCOUNTER — Ambulatory Visit: Payer: Self-pay

## 2015-03-14 DIAGNOSIS — F32A Depression, unspecified: Secondary | ICD-10-CM
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
## 2015-03-22 ENCOUNTER — Ambulatory Visit: Payer: Self-pay

## 2015-03-22 DIAGNOSIS — F32A Depression, unspecified: Secondary | ICD-10-CM
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
## 2015-03-28 ENCOUNTER — Ambulatory Visit: Payer: Self-pay

## 2015-03-28 DIAGNOSIS — F32A Depression, unspecified: Secondary | ICD-10-CM
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
## 2015-04-03 ENCOUNTER — Ambulatory Visit: Payer: Self-pay | Admitting: Student in an Organized Health Care Education/Training Program

## 2015-04-03 ENCOUNTER — Encounter: Payer: Self-pay | Admitting: Student in an Organized Health Care Education/Training Program

## 2015-04-03 NOTE — Progress Notes (Signed)
Behavioral Health Psychopharmacology Follow-up     Length of Session: 20 minutes.    Diagnosis Addressed    ICD-10-CM ICD-9-CM   1. Depression F32.9 311       Chief Complaint: Patient states "I don't know. Things have been rough"    There were no vitals taken for this visit.  Wt Readings from Last 3 Encounters:   07/11/14 70.3 kg (155 lb)   09/24/13 76.7 kg (169 lb)   09/16/13 75.3 kg (166 lb)       Recent History and Response to Medications  Derek Walker was seen at his home, after waiting 5-10 minutes for him to return, with Dr. Redmond School and Glenda Chroman, team leader.     He states he has been taking his medication but he is unsure if they are working anymore. He expresses some continued anxiety. He denies any side effects. He denies suicidal or homicidal ideation. He denies any auditory or visual hallucinations.    He mentions passing out at a store recently and feels this was likely due to the heat and dehydration. He also reports daytime and nightly sweating. He has lost a lot of weight. He reports he used to weigh close to 210 lbs but now is in the 160 lb range. He denies attempting to lose weight. He has a family history of diabetes. He states that when he was last at his PCP he was cleared medically and he has not been tested for any infectious diseases in several years. He is looking to find a new PCP and is interested in coming to the [facility].     He is scheduled to have surgery to fix the plate in his cheek on July 28th.     Current use of alcohol or drugs: yes occasional alcohol but has not been drunk; he denies any drug use    ENERGY: Fair  SLEEP: Normal.    APPETITE: Poor  WEIGHT: loss  SEXUAL FUNCTION: not asked  ENJOYMENT/INTEREST: Fair    Review of Systems:  Pertinent items are noted in HPI.    Current Medications  Current Outpatient Prescriptions   Medication Sig    OLANZapine (ZYPREXA) 7.5 MG tablet Take 7.5 mg by mouth daily    PARoxetine (PAXIL) 20 MG tablet Take 20 mg by mouth every morning     gabapentin (NEURONTIN) 300 MG capsule Take 1 capsule (300 mg total) by mouth 3 times daily   Take 1 capsule nightly x1 week  Then 2 capsules daily x1 week  Then 3 capsules daily    oxyCODONE-acetaminophen (PERCOCET) 5-325 MG per tablet Take 1 tablet by mouth every 6 hours as needed for Pain   Max daily dose: 4 tablets     No current facility-administered medications for this visit.        Side Effects  None    Mental Status  APPEARANCE: Appears stated age, Well-groomed, Casual, sweating  ATTITUDE TOWARD INTERVIEWER: Cooperative  MOTOR ACTIVITY: WNL (within normal limits)  EYE CONTACT: Direct  SPEECH: Normal rate and tone and Age appropriate  AFFECT: Full Range and Neutral  MOOD: Euthymic  THOUGHT PROCESS: Normal and Goal directed  THOUGHT CONTENT: No unusual themes  PERCEPTION: Within normal limits and No evidence of hallucinations  ORIENTATION: Alert and Oriented X 3.  CONCENTRATION: WNL  MEMORY:   Recent: intact   Remote: intact  COGNITIVE FUNCTION: Average intelligence  JUDGMENT: Intact  IMPULSE CONTROL: Fair  INSIGHT: Age appropriate    Risk Assessment  Self Injury: Patient Denies  Suicidal Ideation: Patient Denies  Homicidal Ideation: Patient Denies  Aggressive Behavior: Patient Denies    If any of the answers above are Yes, is there access to lethal means?   N/A    Results  All labs in the last 3 months:    Hospital Outpatient Visit on 03/03/2015   Component Date Value Ref Range Status    WBC 03/03/2015 5.0  4.2 - 9.1 THOU/uL Final    RBC 03/03/2015 4.2* 4.6 - 6.1 MIL/uL Final    Hemoglobin 03/03/2015 13.9  13.7 - 17.5 g/dL Final    Hematocrit 03/03/2015 43  40 - 51 % Final    MCV 03/03/2015 102* 79 - 92 fL Final    MCH 03/03/2015 33* 26 - 32 pg/cell Final    MCHC 03/03/2015 33  32 - 37 g/dL Final    RDW 03/03/2015 12.7  11.6 - 14.4 % Final    Platelets 03/03/2015 166  150 - 330 THOU/uL Final    Seg Neut % 03/03/2015 61.9  % Final    Lymphocyte % 03/03/2015 21.0  % Final    Monocyte %  03/03/2015 13.5  % Final    Eosinophil % 03/03/2015 2.4  % Final    Basophil % 03/03/2015 0.8  % Final    Neut # K/uL 03/03/2015 3.1  1.8 - 5.4 THOU/uL Final    Lymph # K/uL 03/03/2015 1.1* 1.3 - 3.6 THOU/uL Final    Mono # K/uL 03/03/2015 0.7  0.3 - 0.8 THOU/uL Final    Eos # K/uL 03/03/2015 0.1  0.0 - 0.5 THOU/uL Final    Baso # K/uL 03/03/2015 0.0  0.0 - 0.1 THOU/uL Final    Nucl RBC % 03/03/2015 0.0  0.0 - 0.2 /100 WBC Final    Nucl RBC # K/uL 03/03/2015 0.0  0.0 - 0.0 THOU/uL Final    IMM Granulocytes # 03/03/2015 0.0  0.0 - 0.1 THOU/uL Final    IMM Granulocytes 03/03/2015 0.4  % Final    Sodium 03/03/2015 145  133 - 145 mmol/L Final    Potassium 03/03/2015 4.1  3.3 - 5.1 mmol/L Final    Chloride 03/03/2015 104  96 - 108 mmol/L Final    CO2 03/03/2015 24  20 - 28 mmol/L Final    Anion Gap 03/03/2015 17* 7 - 16 Final    UN 03/03/2015 7  6 - 20 mg/dL Final    Creatinine 03/03/2015 0.67  0.67 - 1.17 mg/dL Final    GFR,Caucasian 03/03/2015 121  * Final    GFR,Black 03/03/2015 140  * Final    *UNITS=mL/min/1.73 square meters    Glucose 03/03/2015 75  60 - 99 mg/dL Final    Comment: Reference Ranges apply only to FASTING samples.    ADA Guidelines Blood Sugar Levels for Diagnosing Diabetes & Pre-diabetes  Normal: < 100 mg/dL  Impaired Fasting Glucose (IFG): 100-125 mg/dL  Diabetes:  > 126 mg/dL on two different occasions      Calcium 03/03/2015 8.7* 9.0 - 10.3 mg/dL Final    Total Protein 03/03/2015 7.5  6.3 - 7.7 g/dL Final    Albumin 03/03/2015 4.7  3.5 - 5.2 g/dL Final    Bilirubin,Total 03/03/2015 0.4  0.0 - 1.2 mg/dL Final    AST 03/03/2015 68* 0 - 50 U/L Final    ALT 03/03/2015 66* 0 - 50 U/L Final    Alk Phos 03/03/2015 91  40 - 130 U/L Final  T LYM %(CD3) 03/03/2015 74  54 - 87 % Final    T LYM #(CD3) 03/03/2015 748* 754 - 2810 cells/uL Final    CD4% 03/03/2015 19* 32 - 71 % Final    CD4# 03/03/2015 186* 496 - 2186 cells/uL Final    T Suppress %(CD8) 03/03/2015 51* 10 - 38 %  Final    CD8# 03/03/2015 510  177 - 1137 cells/uL Final    CD4/CD8 03/03/2015 0.4* 0.7 - 3.0 Final    B LYM %(CD19) 03/03/2015 7  7 - 36 % Final    B LYM #(CD19) 03/03/2015 73* 120 - 725 cells/uL Final    NK LYM %(CD16+56) 03/03/2015 17  4 - 26 % Final    NK LYM #(CD16+56) 03/03/2015 169  37 - 758 cells/uL Final    T LYM% (CD3+4+8+) 03/03/2015 1  % Final    T LYM# (CD3+4+8+) 03/03/2015 9  cells/uL Final    T LYM% (CD3+4-8-) 03/03/2015 6  % Final    T LYM# (CD3+4-8-) 03/03/2015 61  cells/uL Final    NK LYM% (CD3+16+56+) 03/03/2015 4  % Final    NK LYM# (RC7+89+38+) 03/03/2015 41  cells/uL Final    Interp,LMH 03/03/2015 see message   Final    Comment: Reference Range Note:  Pediatric reference values (0-16 years) taken from  [facility], Number 3.  Adult ranges developed in-house in October, 2002.      Syphilis Screen 03/03/2015 Neg   Final    TEST METHOD: BioPLEX(Multiplex Flow Immunoassay)    Syphilis Status 03/03/2015 Nonreact   Final    HIV-1 PCR log 03/03/2015 NEG  copies/mL Final    HIV-1 PCR 03/03/2015 NEG  copies/mL Final    Comment: Lower Limit of Detection: 20 copies/mL  Dynamic Range: 1.3 - 7.0 log copies/mL  Dynamic Range: 20 - 10,000,000 copies/mL    Accurate quantification not possible at < 20 copies/mL.  Specimens in the 1-19 copies/mL range are reported qualitatively  as POS<20.    This specimen was tested using the Roche COBAS Ampliprep COBAS Taqman HIV-1  version 2.0 test.      Chlamydia Plasmid DNA Amplification 03/03/2015 .   Final    N. gonorrhoeae DNA Amplification 03/03/2015 .   Final       Treatment plan is updated      Assessment:  Derek Walker is a 39 y.o. African American man with a history of depression, anxiety, and trauma who was seen this morning for a psychopharmacology follow-up visit at his home by the ACT team. He states he has been taking his medications as prescribed and denies any side effects but continues to doubt their efficacy. He  continues to deny any safety concerns at this time. There is concern that his continued weight loss and constant sweating may signal an unrecognized illness. He has been encouraged to follow-up with his PCP and is considering transitioning his care to the [facility]. At this time, he does not represent an imminent risk to himself or others, therefore he remains appropriate for outpatient care.     Plan and Rationale:  1. F/U with ACT 6 times per month  2. Continue current medicaitons  3. Continue to avoid street drugs and alcohol  4. He was provided informed consent for the above recommended psychiatric medications, following review of common risks/side effects and benefits, including metabolic disturbances, orthostasis and sedation.  5. Call ACT on-call cell phone prior to next visit for any urgent  matters.    Derek Walker agreed to stay safe and with above treatment plan as reviewed above.

## 2015-04-04 ENCOUNTER — Ambulatory Visit: Payer: Self-pay

## 2015-04-11 ENCOUNTER — Ambulatory Visit: Payer: Self-pay

## 2015-04-11 DIAGNOSIS — F32A Depression, unspecified: Secondary | ICD-10-CM

## 2015-04-13 NOTE — Progress Notes (Signed)
Clinical Management Note     In-basket communication with ACT provider:  RE: patient fyi for Monday  Received: 1 week ago     Donny Pique, DO  Jahzara Slattery, Jonelle Sidle     Cc: Meryle Ready; Marlene [place], Louis                Thanks!         Previous Messages      ----- Message -----    From: Satira Anis    Sent: 03/30/2015  2:16 PM     To: Donny Pique, DO, Debby Freiberg, *   Subject: patient fyi for Monday                Hi, Rob. Derek Walker is expecting you at the [employer] on [address] at 11 AM Monday. Please call the phone number on our sheet if it will be a different time. You can leave a message with his mom if she answers.      I wanted to mention two quick tips:   - he has not agreed so far to self-monitor symptoms, to help [place] get a better idea of what's going on for him; not necessary, but might help   -he describes hearing voices, but it sounds to me more like self-talk     He's kept the last two visits and met York Cerise this week.     Thanks!

## 2015-04-13 NOTE — Progress Notes (Signed)
Behavioral Health Progress Note     LENGTH OF SESSION: 60 minutes    Contact Type:  Location: Off Site    Direct     Problem(s)/Goals Addressed from Treatment Plan:    Problem 3:    R PSY TP PROBLEM 3 01/31/2015   3RD TREATMENT PLAN PROBLEM poor physical and mental health       Goal for this problem:    R PSY TP GOAL 3 01/31/2015   3RD TREATMENT PLAN GOAL "I will get my health back in order (nutrition, weight, exercise)".       Progress towards this goal: No change    Mental Status Exam:  APPEARANCE: Well-groomed  ATTITUDE TOWARD INTERVIEWER: Cooperative  MOTOR ACTIVITY: WNL (within normal limits)  EYE CONTACT: Direct  SPEECH: Normal rate and tone  AFFECT: Full Range  MOOD: Anxious  THOUGHT PROCESS: Intact  THOUGHT CONTENT: No unusual themes  PERCEPTION: No evidence of hallucinations  CURRENT SUICIDAL IDEATION: patient denies  CURRENT HOMICIDAL IDEATION: Patient denies  ORIENTATION: Alert and Oriented X 3.  CONCENTRATION: Fair  MEMORY:   Recent: intact   Remote: intact  COGNITIVE FUNCTION: Average intelligence  JUDGMENT: Impaired -  mild  IMPULSE CONTROL: Intact  INSIGHT: Fair    Risk Assessment:  ASSESSMENT OF RISK FOR SUICIDAL BEHAVIOR  Changes in risk for suicide from baseline Formulation of Risk and/or previous intake, including newly identified risk, if any: none  Violence risk was assessed and No Change noted from baseline formulation of risk and/or previous assessment.      Session Content::  Trini called this mornign to confirm our appointment at [facility] on [name].  He was there waiting when writer arrived.  Various issues covered during session:    1) He did see Dr. Melchor Amour on 7/18 at his mother's house.  She made them meet outside.  WRiter talked with Conn about cabing in for an OV next time.  Writer to check on stauts of medicaid cab.  He liked the idea.  IN terms of medicaitons, he reports he's taking a new prn "green" pill and it is effective in helping with anxiety/panic.  Writer thought I  understood him to say it was prescribed by [facility].  STates he took two pills and was told this is ok.  He had no adverse effects.  Writer to verify script.  When asked the name, he did not know.  Writer mentioned hydorxiyzine, and he said, "yay, that's it".      Writer also talked with him about anxiety symptoms/panic and coping strategies in additoin to [employer], including emotion regulation skills and eventual exposure.  He's done some exposure on his own, such as now beign used to the route to/from [facility].      2) He reports erratic sleep.  Recently, he told write rhe goes to bed at 5 PM, then awakes at 2 or 3 Am and goes back for a nap around 6 AM.  Now, he said this is about one day out of the week.  He was agreeable to using a sleep log, which writer will bring to our next visit.  Writer also put out the idea of exercise.  He would like to go on a tour of the downtown [employer].  Also, he reports a possible history of apnea and not following through with sleep study in the past.  Writer to assist with f/u on this.  Additionally, the noise int he house is sometimes a barrier.  Writer to  supply ear plugs for him to try.      3) primary care:  Octavia said Dr. Melchor Amour suggested he switch from [facility] to [facility].  Chancy feels his doctor does not do a comprehensive job of managing his care.  He likes the HIV clinic, however.  Writer suggested he think about what he wants to do and I will f/u again with him on this.  Writer provided information regarding [facility] as an option.  Weight loss remains a concern.  States he did like the carnation breakfast.  His sister will help him with milk.  ACT to buy a tub of hte powder for him.  There are issues at the house with access to food, which compounds the problem for him.  HIs mother does make meals, though.      4) housing- Hosey is afraid to leave the hosue, which makes it difficult to apartment hunt.  It will be important to find a neighborhood in which he is more  comfortable.  We discussed this at some length.  Options include asking [facility] if the felony would be a problem.  Writer to also make contact with a landlord known to [facility] who has places in [place].  [place] on [facility] [facility] only.  He still is not interested in supporsted housing.  [facility] may be another option, though is probably does not meet criteria at present.        5) MICA session:  Tayjon denied he was a no show for [facility] on 7/19.  He would like to reschedule.  Writer to f/u with Truddie Hidden.        Interventions:  Problem Solving Therapy skills, Supportive Psychotherapy, psychoed    Current Treatment Plan   Created/Updated On 01/31/2015   Extends to 07/13/2015         Plan:  Psychotherapy continues as described in care plan; plan remains the same.    NEXT APPT: 8/2      Delaney Meigs Tyiana Hill

## 2015-04-17 NOTE — Progress Notes (Signed)
Clinical Management Note     In-basket communication with providers:    RE: ct reports he has a new prn, 'green' pill?  Received: Today     Maxie Better, DO  Ally Knodel, Delaney Meigs; Redgie Grayer, DO     Cc: Erasmo Score, LMSW                Thanks, Tammie.         Previous Messages      ----- Message -----    From: Ardelle Park    Sent: 04/13/2015 12:08 PM     To: Maxie Better, DO, Erasmo Score, LMSW, *   Subject: ct reports he has a new prn, 'green' pill?      Hi. Lum reported to me on 7/26 when I saw him that a new antianxiety-prn was prescribed. He said it's a green pill and he was told he can take two. He said it might have been hydroxyzine, but I don't know if that's green. Anyway, he indicated it was you guys who gave it to him, but as I look in the chart, I wonder if it was someone at [facility] or [facility]. I see him again on 8/2.      He was going to think about transferring care from [facility] to [facility]. Will follow up.      Also, I have to call him, so can ask him to read the bottle to me. Will let you know after I reach him. If you have any other thoughts, please let me know. Thanks!     Tammie

## 2015-04-18 ENCOUNTER — Ambulatory Visit: Payer: Self-pay

## 2015-04-19 NOTE — Progress Notes (Signed)
[facility] MISSED/CANCELLED APPOINTMENT     Name: Derek Walker  MRN: [id number]   DOB: 1975/10/01    Date of Scheduled Service: 04/18/15    [name] was a no show for today's appointment.  I have attempted to reach him by phone the past two days, both at his mother's and the mother of his child.      Additional Information:    Writer discussed wiht the team in morning report [name]'s pattern of inconsitently keeping appointments.  Because he is making some contact, we will not look at discharge at this point.  However, Clinical research associate will send another letter reinforcing the need for visits in order to maintain ACT services.

## 2015-04-19 NOTE — Progress Notes (Signed)
Clinical Management Note       Case discussed briefly in [facility] morning report.  Agreed upon plan for writer to send out letter today (see under letters).

## 2015-04-20 ENCOUNTER — Telehealth: Payer: Self-pay

## 2015-04-20 NOTE — Telephone Encounter (Signed)
Clinical Management Note     Writer received vm this morning from Humphrey, stating he was at [facility] Tuesday.  Wrier phoned him back this afternoon.  His mother prompted family to wake him.  When he came to the phone, he sounded under the influence, tot he point writer almost idd not recognize his voice at first.  Oaklen attributed blame for the missed appointment Tuesday, saying Clinical research associate doesn't return his calls, etc.  Writer attempted to explain I did try calling him, but the point was, per our agreement he needs to call on Monday or Tuesday AM to confirm visits before we drive out.  Clinical research associate sent out another letter ot this effect yesterday (see letters).  Euell wasn't make a lot of sense on when exactly he thinks he called, but insisted he left a vm on Monday or Tuesday.  Writer did not receive it.  Rather than attempt to reason with him, Clinical research associate suggested we meet tomorrow at noon at [facility].  He agreed.    He reported feeling sick physically, throwing up the past three days and not able to keep much down.  Complaints today were physical, not mental health in nature.  Writer suggested he call his PCP for triage, asking to speak with someone immediately, not just leaving vm with the case manager and/or to go to [facility] urgent care.  Writer spoke with his mom, [name] who agreed to follow up on this with him and take  Him to urgent care.  Writer thanked her and informed her of our scheduled visit tomorrow.  She will remind Carle.

## 2015-04-21 ENCOUNTER — Ambulatory Visit: Payer: Self-pay

## 2015-04-21 NOTE — Progress Notes (Signed)
[facility] MISSED/CANCELLED APPOINTMENT     Name: Derek Walker  MRN: [id number]   DOB: 12-15-1975    Date of Scheduled Service: 04/21/15    Mr. Cortez was a no show for today's appointment.  I have attempted to reach him by phone.  The young man who answered at [address] said he wasn't there.  No one answered at the 576 number (child's mother).  Writer waited 15 minutes at [facility].      Additional Information:    Not applicable

## 2015-04-26 ENCOUNTER — Telehealth: Payer: Self-pay

## 2015-04-26 NOTE — Telephone Encounter (Signed)
Clinical Management Note     04/25/15  Calls from [place]:   [employer]:  "I got your letter...was trying to call but NEVER can get you on the phone... I'm always feeling threatened by you with all these letters.. when you're never at your desk", etc.      ACT emergency phone voice mail:  "How the hell you mother f___ crazy ass people got all these niggers and don't answer the phone... send out these f___  Letters...you people need the medicine more than me....  I'm a little sick of this bullshit... I hate all you all" and hung up.      Jailen did not call on 8/2 to confirm his appointment with Clinical research associate.  There was no call from him on writer's cell, nor message left on work phone, nor with any other team member.  As planned, Clinical research associate went to the [employer] 8/5 at noon.  He was not there.  Please see notes for further detail.  The agreement we had, and which Attilio clearly understood, was that he is to call on the Monday or Tuesday morning to confirm our 11 AM visits at [facility].  He has followed through with this on 2 or 3 other occasions.  Reubin was fully instructed on how the ACT team is a community based program and he was given writer's cell.  He has called on the cell previously.      His calls today were apparently in response to the letter he received (see under media).  Writer discussed the case with team leader.  Writer and Murvin previously talked about how if [facility] visits and confirmation plan did not work, we would start to schedule in-office.  Writer verified his cab is authorized.  ACT team leader, Gordan Payment Spoke with Sigifredo by phone today, advising him of this, as well as clarifying expectations in terms of communication and refraining from abusive language.  He agreed.  Writer to phone [facility] to set up cab.

## 2015-05-01 ENCOUNTER — Ambulatory Visit: Payer: Self-pay

## 2015-05-02 ENCOUNTER — Telehealth: Payer: Self-pay

## 2015-05-02 ENCOUNTER — Ambulatory Visit: Payer: Self-pay

## 2015-05-02 DIAGNOSIS — B20 Human immunodeficiency virus [HIV] disease: Secondary | ICD-10-CM

## 2015-05-02 DIAGNOSIS — F32A Depression, unspecified: Secondary | ICD-10-CM

## 2015-05-02 DIAGNOSIS — Z7189 Other specified counseling: Secondary | ICD-10-CM

## 2015-05-02 NOTE — Telephone Encounter (Signed)
Tammy from [facility] is calling to make a new patient appointment for the patient. She states the patient's diagnosis is HIV.     Please call Tammy back at [phone] to schedule.

## 2015-05-02 NOTE — Telephone Encounter (Signed)
Attempted to call [facility].    Left a message to call the clinic back

## 2015-05-03 DIAGNOSIS — Z7189 Other specified counseling: Secondary | ICD-10-CM | POA: Insufficient documentation

## 2015-05-03 DIAGNOSIS — B20 Human immunodeficiency virus [HIV] disease: Secondary | ICD-10-CM | POA: Insufficient documentation

## 2015-05-03 NOTE — Progress Notes (Signed)
Behavioral Health Progress Note     LENGTH OF SESSION: 90 minutes    Contact Type:  Location: On Site    Direct     Problem(s)/Goals Addressed from Treatment Plan:    Problem 3:    R PSY TP PROBLEM 3 01/31/2015   3RD TREATMENT PLAN PROBLEM poor physical and mental health       Goal for this problem:    R PSY TP GOAL 3 01/31/2015   3RD TREATMENT PLAN GOAL "I will get my health back in order (nutrition, weight, exercise)".       Progress towards this goal: Problem resolving. Comment see steps taken below    Mental Status Exam:  APPEARANCE: Well-groomed  ATTITUDE TOWARD INTERVIEWER: Cooperative  MOTOR ACTIVITY: WNL (within normal limits)  EYE CONTACT: Direct  SPEECH: Normal rate and tone  AFFECT: Full Range  MOOD: Anxious and Depressed  THOUGHT PROCESS: Goal directed and Intact  THOUGHT CONTENT: Negative Rumination  PERCEPTION: No evidence of hallucinations  CURRENT SUICIDAL IDEATION: patient denies  CURRENT HOMICIDAL IDEATION: Patient denies  ORIENTATION: Alert and Oriented X 3.  CONCENTRATION: WNL  MEMORY:   Recent: intact   Remote: not assessed  COGNITIVE FUNCTION: Average intelligence  JUDGMENT: Impaired -  mild  IMPULSE CONTROL: Fair  INSIGHT: Good    Risk Assessment:  ASSESSMENT OF RISK FOR SUICIDAL BEHAVIOR  Changes in risk for suicide from baseline Formulation of Risk and/or previous intake, including newly identified risk, if any: none  Violence risk was assessed and No Change noted from baseline formulation of risk and/or previous assessment.      Session Content::  Writer saw Marqus in the office as scheduled today.  He came in by cab.  Mental status appeared clear/organized with good attention and focus, pleasant and apologetic demeanor, affect in full range and mood moderately depressed and highly anxious. He acknowledges problems with anger and temper when overwhelmed.  He tolerated the session well.  Needs were prioritized as follows:    1) Medications:  Jakeim reported [facility] would not fill his scripts for  Paxil and Zyprexa last week.  Writer reminded him this is because he is followed by Dr. Melchor Amour now.  Writer in-basketed Drs. Melchor Amour and [place].  Vian reports he's been out of these meds for about 3 weeks.  Accuracy of this uncertain.  He also reports [facility] prescribed a green pill for anxiety which he finds helpful.  Reports as to whether psychiatric meds are helpful in managing syptoms is somewhat contradictory.      As far as medical meds, he reports to have obtained these from the PCP and/or ID clinic at [facility].  However, he went about 12 days without the HIV med.  He reports [facility] staff did not get back to him promptly.      Gerhardt also reports using ibuprofen and tylenol PM.  The latter for sleep.     2) Health care:  Leiam decided he would like to transfer care.  Writer facilitated linkage to [facility] for an initial patient appointment, which is to be scheduled.  [facility] will contact [name].  Writer assisted him in contacting the ID clinic as well to make an initial patient appointment.  WE are playing phone tag now with the scheduler.  Writer will fe/u.      3) Housing:  Okley given a [employer] application, which he will fill out for out next visit, 8/18.  Writer advised contact made with property management, who said criminal records  are handled on case by case situation.      4) Insomnia:  Writer provided information on sleep hygiene, a sleep log and using the calming breath.  We did not have time to work on this, but Clinical research associate will f/u with [name], and also inform prescribers.  Negative ruminations, "my thoughts are rolling....they won't stop", persist.  Zyprexa slows them down 'a little'.      5) Educational goal:  Jayvon announced his sister and brother-in-law "motivated me to go to school.  I'm going to go to [employer] either today or tomorrow".  Counseling provided around realistic goal setting and next steps in context of strengths/barriers.      In addition to the above, Clinical research associate informed lukis bunt had a shift in  primary caseloads, and Truddie Hidden will now be his counselor, though Clinical research associate will still be able to meet with him.  Quay's fan we bought broke.  Plan made to take him to [facility] 8/18 if he makes contact to confirm that morning.  There are other care coordination areas upon which to follow up.  We will plan to meet with Truddie Hidden for OV 8/23, in order to review all details.        Interventions:  assessment of mental status, care coordination, mental health counseling (sx managment, goal setting)    Current Treatment Plan   Created/Updated On 01/31/2015   Extends to 07/13/2015         Plan:  Psychotherapy continues as described in care plan; plan remains the same. and Other planned interventions or recommendations: see above    NEXT APPT: 8/18      Delaney Meigs Aalyah Mansouri

## 2015-05-03 NOTE — Telephone Encounter (Signed)
Left message for Tammy to call our clinic back

## 2015-05-03 NOTE — Progress Notes (Signed)
Clinical Management Note     RE: ct needs scripts and question about prn  Received: Today     Israella Hubert, [name], [name], DO; Karlton Lemon, MD     Cc: Glenda Chroman, LMSW                Dorothy Puffer! We have you up for an OV 12:30-12:45 with another patient. I was thinking of setting up a cab for [place] to get here at 11:45. I'll assume that's ok, but if not, please let me know.          Previous Messages      ----- Message -----    From: Donny Pique, DO    Sent: 05/02/2015  1:16 PM     To: Karlton Lemon, MD, Satira Anis   Subject: RE: ct needs scripts and question about prn      Yes, please put him on my list for Monday.     Thanks, Tammie     Rob   ----- Message -----    From: Satira Anis    Sent: 05/02/2015 10:55 AM     To: Donny Pique, DO, Karlton Lemon, MD   Subject: ct needs scripts and question about prn        Hi,Rob and Richardson Landry. Richardson Landry, I know you haven't met this patient yet. There are two meds issues:     1) It looks like we haven't prescribed yet. I know, Rob, you were saying you weren't going to change anything. Heather Muxworthy at [facility] was the last prescriber. The meds are Zyprexa and Paxil. Lavelle says he's been out for about 3 weeks.     2) PRN (see below)- he says Heather prescribed a green pill some time ago. He'd like a prn for anxiety.      He's not scheduled yet this month for psychopharm. Rob, if we have you Monday, I can try to put him on with you then.      Thanks!     Tammie     ----- Message -----    From: Donny Pique, DO    Sent: 04/17/2015  9:24 AM     To: Satira Anis, Glenda Chroman, LMSW, *   Subject: RE: ct reports he has a new prn, 'green' pil*     Thanks, Tammie.   ----- Message -----    From: Satira Anis    Sent: 04/13/2015 12:08 PM     To: Donny Pique, DO, Glenda Chroman, LMSW, *   Subject: ct reports he has a new prn, 'green' pill?      Hi. Kenzel reported to me on 7/26 when  I saw him that a new antianxiety-prn was prescribed. He said it's a green pill and he was told he can take two. He said it might have been hydroxyzine, but I don't know if that's green. Anyway, he indicated it was you guys who gave it to him, but as I look in the chart, I wonder if it was someone at [facility] or [facility]. I see him again on 8/2.      He was going to think about transferring care from [facility] to [facility]. Will follow up.      Also, I have to call him, so can ask him to read the bottle to me. Will let you know after I reach him. If you have any other thoughts, please let me know. Thanks!     Tammie

## 2015-05-03 NOTE — Telephone Encounter (Signed)
SOURCE OF INFORMATION: Tammy Defelipo- mental health therapist      WHAT IS YOUR PRIMARY LANGUAGE: english  INTERPRETER NEEDED? No      DATE OF FIRST POSITIVE HIV TEST: unknown  TESTING SITE FOR POSITIVE HIV TEST: unknown     PREVIOUS NEGATIVE HIV TEST IN THE PAST 6 MONTHS-1 YR?no      PREVIOUS HIV MEDICAL PROVIDER: current pt at  [facility]      SYMPTOMS OF ACUTE HIV? No      RECENT HOSPITALIZATIONS? No      CURRENT MEDICATIONS: unknown     ARE THERE TRANSPORTATION ISSUES: no        PLAN: new HIV appointment   Provider preference: no        APPOINTMENT DATE/TIME: 05/17/15 at 10:00 am with Dr Blair Heys and 11:30 am with Lynne Leader      NOTIFICATIONS WITH TASK OF THIS FORM:         INSURED? yes      INTERPRETER SCHEDULED FOR FIRST VISIT?N/A     OTHER NEEDS:no

## 2015-05-04 ENCOUNTER — Telehealth: Payer: Self-pay

## 2015-05-04 ENCOUNTER — Ambulatory Visit: Payer: Self-pay

## 2015-05-04 NOTE — Telephone Encounter (Signed)
Clinical Management Note     PC with Vijay to confirm our 8/18 appointment, to exchange his fan and review [facility] application.  Writer also advised of status of medical appoitnments:    - playing phone tag with ID clinic (later spoke with Carley Hammed- scheduled 8/31 @ 10 AM.  Writer to inform Eliasar 8/18 and set up cab).   -Dr. Melchor Amour office visit 8/22 @ 11:45.  Writer to set up cab.  -ACT MICA specialist and writer 8/23 OV 11:30 AM.  Writer to set up cab.
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
## 2015-05-08 ENCOUNTER — Other Ambulatory Visit: Payer: Self-pay | Admitting: Psychiatry

## 2015-05-08 ENCOUNTER — Ambulatory Visit: Payer: Self-pay | Admitting: Child & Adolescent Psychiatry

## 2015-05-08 ENCOUNTER — Encounter: Payer: Self-pay | Admitting: Psychiatry

## 2015-05-08 ENCOUNTER — Encounter: Payer: Self-pay | Admitting: Child & Adolescent Psychiatry

## 2015-05-08 DIAGNOSIS — F32A Depression, unspecified: Secondary | ICD-10-CM

## 2015-05-08 DIAGNOSIS — F419 Anxiety disorder, unspecified: Secondary | ICD-10-CM

## 2015-05-08 MED ORDER — OLANZAPINE 7.5 MG PO TABS *I*
7.5000 mg | ORAL_TABLET | Freq: Every day | ORAL | 1 refills | Status: DC
Start: 2015-05-08 — End: 2015-06-15

## 2015-05-08 MED ORDER — PAROXETINE HCL 20 MG PO TABS *I*
20.0000 mg | ORAL_TABLET | Freq: Every morning | ORAL | 1 refills | Status: DC
Start: 2015-05-08 — End: 2015-08-24
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
## 2015-05-09 ENCOUNTER — Ambulatory Visit: Payer: Self-pay

## 2015-05-10 NOTE — Progress Notes (Signed)
[facility] MISSED/CANCELLED APPOINTMENT     Name: ARNO CULLERS  MRN: [id number]   DOB: May 07, 1976    Date of Scheduled Service: 05/09/15    Mr. Campo was a no show for today's appointment.  I have spoken with the patient by phone.  The patient stated the reason for today's absence is he wasn't sure if the appointment was still scheduled.  Copywriter, advertising clarified, going forward, unless he hears otherwise, scheduled office visits are to take place and he should be prepared to get on the cab.  The purpose of meeting today was going to be so Clinical research associate could transfer primary coordination of hte case to Truddie Hidden, ACT MICA specialist.  Writer will provide Truddie Hidden with background and recommended Tarron schedule directly with him.  Writer will then schedule a therapy appointment with [name] as it fits in to his overall ACT plan.      Additional Information:    Not applicable

## 2015-05-10 NOTE — Progress Notes (Signed)
Clinical Management Note     RE: ct needs scripts and question about prn  Received: Today     Jahrell Hamor, [name], [name], DO                Thanks, Rob. He told me you advised him to take the meds daily and you didn't talk about a prn. Just an Juluis Rainier, York Cerise is now his primary counselor on the team, but I will probably remain involved for some therapy. We'll see. He did not show yesterday.          Previous Messages      ----- Message -----    From: Donny Pique, DO    Sent: 05/08/2015 12:25 PM     To: Satira Anis, Meryle Ready   Subject: RE: ct needs scripts and question about prn      Hi Tammy,     Greta Doom today for psychopharm.   Appears to be functioning at baseline with anxiety and guarded presentation. Reports being out of meds as you did. Renewed Olz and Pax for him at [facility]. He also has medical appt. On 8/30 for ID that he plans on attending.     He plans on seeing you tomorrow at [place] (Tuesday).     Thanks,     Rob       ----- Message -----    From: Satira Anis    Sent: 05/02/2015 10:55 AM     To: Donny Pique, DO, Karlton Lemon, MD   Subject: ct needs scripts and question about prn        Hi,Rob and Richardson Landry. Richardson Landry, I know you haven't met this patient yet. There are two meds issues:     1) It looks like we haven't prescribed yet. I know, Rob, you were saying you weren't going to change anything. Heather Muxworthy at [facility] was the last prescriber. The meds are Zyprexa and Paxil. Sankalp says he's been out for about 3 weeks.     2) PRN (see below)- he says Heather prescribed a green pill some time ago. He'd like a prn for anxiety.      He's not scheduled yet this month for psychopharm. Rob, if we have you Monday, I can try to put him on with you then.      Thanks!     Tammie     ----- Message -----    From: Donny Pique, DO    Sent: 04/17/2015  9:24 AM     To: Satira Anis, Glenda Chroman, LMSW, *   Subject: RE: ct reports he has a  new prn, 'green' pil*     Thanks, Tammie.   ----- Message -----    From: Satira Anis    Sent: 04/13/2015 12:08 PM     To: Donny Pique, DO, Glenda Chroman, LMSW, *   Subject: ct reports he has a new prn, 'green' pill?      Hi. Brenin reported to me on 7/26 when I saw him that a new antianxiety-prn was prescribed. He said it's a green pill and he was told he can take two. He said it might have been hydroxyzine, but I don't know if that's green. Anyway, he indicated it was you guys who gave it to him, but as I look in the chart, I wonder if it was someone at [facility] or [facility]. I see him again on 8/2.      He was going to think about transferring care from  [facility] to [facility]. Will follow up.      Also, I have to call him, so can ask him to read the bottle to me. Will let you know after I reach him. If you have any other thoughts, please let me know. Thanks!     Tammie

## 2015-05-11 ENCOUNTER — Ambulatory Visit: Payer: Self-pay

## 2015-05-16 ENCOUNTER — Telehealth: Payer: Self-pay

## 2015-05-16 NOTE — Telephone Encounter (Signed)
Spoke with the patient's nephew and asked him to remind the patient that he has an appointment at 10AM tomorrow.

## 2015-05-17 ENCOUNTER — Ambulatory Visit: Payer: Self-pay | Admitting: Infectious Diseases

## 2015-05-17 ENCOUNTER — Ambulatory Visit: Payer: Self-pay

## 2015-05-18 NOTE — Progress Notes (Signed)
Clinical Management Note     RE: ct needs scripts and question about prn  Received: Today     Derek Walker, Derek Walker, Derek Baltimore, DO; Walker, Derek     Cc: Derek Walker, LMSW                Thanks, Derek Walker. We had him scheduled with the ID clinic yesterday. We were going to bring him, but Derek Walker could not get in touch with him despite several attempts. [facility] has not gotten back to [place] yet that I am aware of with an appointment, unless they called him directly. Nothing in patient station.      He could have primary care and ID care in one place if he'd like, which probably makes sense. He may end up staying with [facility] at this rate.      Just an fyi, we did some changing around of primary people, so now Derek Walker is his point person.          Previous Messages      ----- Message -----    From: Derek Pique, DO    Sent: 05/17/2015 10:45 PM     To: Derek Walker   Subject: RE: ct needs scripts and question about prn      Correct.   Wanted him to see his ID doc to deal with his significant sweating issue. Concerned about medical vs. Psych cause of this.     Thanks,     Derek Walker   ----- Message -----    From: Satira Anis    Sent: 05/10/2015 11:06 AM     To: Derek Pique, DO   Subject: RE: ct needs scripts and question about prn      Thanks, Derek Walker. He told me you advised him to take the meds daily and you didn't talk about a prn. Just an Juluis Rainier, Derek Walker is now his primary counselor on the team, but I will probably remain involved for some therapy. We'll see. He did not show yesterday.      ----- Message -----    From: Derek Pique, DO    Sent: 05/08/2015 12:25 PM     To: Satira Anis, Meryle Ready   Subject: RE: ct needs scripts and question about prn      Hi Tammy,     Greta Doom today for psychopharm.   Appears to be functioning at baseline with anxiety and guarded presentation. Reports being out of meds as you did. Renewed Olz and Pax for him at [facility]. He also has medical  appt. On 8/30 for ID that he plans on attending.     He plans on seeing you tomorrow at [place] (Tuesday).     Thanks,     Derek Walker       ----- Message -----    From: Satira Anis    Sent: 05/02/2015 10:55 AM     To: Derek Pique, DO, Karlton Lemon, MD   Subject: ct needs scripts and question about prn        Hi,Derek Walker and Richardson Landry. Richardson Landry, I know you haven't met this patient yet. There are two meds issues:     1) It looks like we haven't prescribed yet. I know, Derek Walker, you were saying you weren't going to change anything. Heather Muxworthy at [facility] was the last prescriber. The meds are Zyprexa and Paxil. Christopher says he's been out for about 3 weeks.     2) PRN (see below)- he says Heather prescribed a green pill  some time ago. He'd like a prn for anxiety.      He's not scheduled yet this month for psychopharm. Derek Walker, if we have you Monday, I can try to put him on with you then.      Thanks!     Tammie     ----- Message -----    From: Derek Pique, DO    Sent: 04/17/2015  9:24 AM     To: Satira Anis, Derek Walker, LMSW, *   Subject: RE: ct reports he has a new prn, 'green' pil*     Thanks, Tammie.   ----- Message -----    From: Satira Anis    Sent: 04/13/2015 12:08 PM     To: Derek Pique, DO, Derek Walker, LMSW, *   Subject: ct reports he has a new prn, 'green' pill?      Hi. Lillard reported to me on 7/26 when I saw him that a new antianxiety-prn was prescribed. He said it's a green pill and he was told he can take two. He said it might have been hydroxyzine, but I don't know if that's green. Anyway, he indicated it was you guys who gave it to him, but as I look in the chart, I wonder if it was someone at [facility] or [facility]. I see him again on 8/2.      He was going to think about transferring care from [facility] to [place] [facility]. Will follow up.      Also, I have to call him, so can ask him to read the bottle to me. Will let you know after I reach him. If you have  any other thoughts, please let me know. Thanks!     Tammie

## 2015-05-25 ENCOUNTER — Ambulatory Visit: Payer: Self-pay

## 2015-05-25 ENCOUNTER — Telehealth: Payer: Self-pay | Admitting: Infectious Diseases

## 2015-05-25 NOTE — Telephone Encounter (Signed)
Derek Walker is calling to re-schedule an appointment with Lynne Leader & Dr. Blair Heys that he had missed on 8/31. The patient would like to be seen as a New HIV Transfer from [facility].    Please call the patient to schedule at [phone]

## 2015-05-25 NOTE — Telephone Encounter (Signed)
[name] is calling to re-schedule an appointment with [name] & Dr. Gwenyth Bouillon that he had missed on 8/31. The patient would like to be seen as a New HIV Transfer from [facility].    Please call the patient to schedule at [phone]. -Writer does not have access to providers schedule.

## 2015-05-25 NOTE — Telephone Encounter (Signed)
Derek Walker, selected 9/14 at10:30

## 2015-05-28 NOTE — Progress Notes (Signed)
Behavioral Health Progress Note     LENGTH OF SESSION: 60 minutes    Contact Type:  Location: On Site    Face to Face     Problem(s)/Goals Addressed from Treatment Plan:    Problem 3:    R PSY TP PROBLEM 3 01/31/2015   3RD TREATMENT PLAN PROBLEM poor physical and mental health       Goal for this problem:    R PSY TP GOAL 3 01/31/2015   3RD TREATMENT PLAN GOAL "I will get my health back in order (nutrition, weight, exercise)".       Progress towards this goal: No change    Mental Status Exam:  APPEARANCE: Appears stated age, Casual  ATTITUDE TOWARD INTERVIEWER: Cooperative  MOTOR ACTIVITY: WNL (within normal limits)  EYE CONTACT: Direct  SPEECH: Normal rate and tone  AFFECT: Constricted  MOOD: Irritable  THOUGHT PROCESS: Normal  THOUGHT CONTENT: No unusual themes  PERCEPTION: Within normal limits  CURRENT SUICIDAL IDEATION: patient denies  CURRENT HOMICIDAL IDEATION: Patient denies  ORIENTATION: Alert and Oriented X 3.  CONCENTRATION: WNL  MEMORY:   Recent: intact   Remote: intact  COGNITIVE FUNCTION: Average intelligence  JUDGMENT: Impaired -  moderate  IMPULSE CONTROL: Fair  INSIGHT: Fair    Risk Assessment:  ASSESSMENT OF RISK FOR SUICIDAL BEHAVIOR  Changes in risk for suicide from baseline Formulation of Risk and/or previous intake, including newly identified risk, if any: none      Session Content::  Client came to the ACT office today to meet for his fist individual MICA meeting after he had been transferred to writer's caseload. Client stated he does not feel well physically. His care is being transferred from [facility] to [facility] and [facility]. Client called the In[facility] Disease Clinic to verify his appointment date and time and writer attempted to schedule a cab for client but he was refused a cab because he needed a doctor's statement confirming that client could not take a public bus. Writer made plans with client to drive him to his  initial appointment at the clinic and started the process of having a physician confirm client's need for a cab. Client and Clinical research associate agreed to meet weekly on Thursdays at 11:00. Client shared that he could not relate to any of his family members and that he often feels angry with them. Client has plans to apply for housing at [employer] to move out of his mother's home.       Interventions:  Motivational Interviewing, Problem Solving Therapy skills, Provided Psychoeducation, Supportive Psychotherapy    Current Treatment Plan   Created/Updated On 01/31/2015   Extends to 07/13/2015         Plan:  Psychotherapy continues as described in care plan; plan remains the same.    NEXT APPT: 05/18/15      Lenice Pressman

## 2015-05-28 NOTE — Progress Notes (Signed)
Behavioral Health Progress Note     LENGTH OF SESSION: 45 minutes    Contact Type:  Location: On Site    Face to Face     Problem(s)/Goals Addressed from Treatment Plan:    Problem 3:    R PSY TP PROBLEM 3 01/31/2015   3RD TREATMENT PLAN PROBLEM poor physical and mental health       Goal for this problem:    R PSY TP GOAL 3 01/31/2015   3RD TREATMENT PLAN GOAL "I will get my health back in order (nutrition, weight, exercise)".       Progress towards this goal: No change    Mental Status Exam:  APPEARANCE: Appears stated age, Casual  ATTITUDE TOWARD INTERVIEWER: Cooperative  MOTOR ACTIVITY: WNL (within normal limits)  EYE CONTACT: Direct  SPEECH: Normal rate and tone  AFFECT: Constricted  MOOD: Irritable  THOUGHT PROCESS: Normal  THOUGHT CONTENT: No unusual themes  PERCEPTION: Within normal limits  CURRENT SUICIDAL IDEATION: patient denies  CURRENT HOMICIDAL IDEATION: Patient denies  ORIENTATION: Alert and Oriented X 3.  CONCENTRATION: WNL  MEMORY:   Recent: intact   Remote: intact  COGNITIVE FUNCTION: Average intelligence  JUDGMENT: Impaired -  moderate  IMPULSE CONTROL: Fair  INSIGHT: Fair    Risk Assessment:  ASSESSMENT OF RISK FOR SUICIDAL BEHAVIOR  Changes in risk for suicide from baseline Formulation of Risk and/or previous intake, including newly identified risk, if any: none      Session Content::  Client came to the ACT office where we scheduled him a new appointment at the [facility] clinic after he did not show to be transported to his last appointment. Client also signed a release of information allowing information about his HIV status to be released to [facility]. Client's cab was scheduled for his medical appointment on 05/31/15.       Interventions:  Motivational Interviewing, Problem Solving Therapy skills, Provided Psychoeducation, Supportive Psychotherapy    Current Treatment Plan   Created/Updated On 01/31/2015   Extends to 07/13/2015         Plan:  Psychotherapy  continues as described in care plan; plan remains the same.    NEXT APPT: 06/01/15      Lenice Pressman

## 2015-05-30 ENCOUNTER — Ambulatory Visit: Payer: Self-pay

## 2015-05-30 ENCOUNTER — Telehealth: Payer: Self-pay

## 2015-05-30 NOTE — Progress Notes (Signed)
Behavioral Health Progress Note     LENGTH OF SESSION: 20 minutes    Contact Type:  Location: Off Site for wellness check and engagement.    Face to Face     Problem(s)/Goals Addressed from Treatment Plan:    Problem 1:   R PSY TP PROBLEM 01/31/2015   1ST TREATMENT PLAN PROBLEM unstable housing       Goal for this problem:    R PSY TP GOAL 01/31/2015   1ST TREATMENT PLAN GOAL "I will get my own apartment asap".         Progress towards this goal: Problem resolving. Comment Patient is presently working on getting his own apt.    Mental Status Exam:  APPEARANCE: Appears stated age  ATTITUDE TOWARD INTERVIEWER: Cooperative  MOTOR ACTIVITY: WNL (within normal limits)  EYE CONTACT: Direct  SPEECH: Normal rate and tone  AFFECT: Full Range  MOOD: Normal  THOUGHT PROCESS: Normal  THOUGHT CONTENT: No unusual themes  PERCEPTION: Within normal limits  CURRENT SUICIDAL IDEATION: patient denies  CURRENT HOMICIDAL IDEATION: Patient denies  ORIENTATION: Alert and Oriented X 3.  CONCENTRATION: WNL  MEMORY:   Recent: intact   Remote: intact  COGNITIVE FUNCTION: Average intelligence  JUDGMENT: Intact  IMPULSE CONTROL: Good  INSIGHT: Age appropriate    Risk Assessment:  ASSESSMENT OF RISK FOR SUICIDAL BEHAVIOR  Changes in risk for suicide from baseline Formulation of Risk and/or previous intake, including newly identified risk, if any: none      Session Content::  Writer met with patient outside his parents' house which is where he is staying. Writer met with patient for the first time. He told Probation officer he has an appointment to look at the apartment at [employer] on Thursday and that Meryl Crutch from the ACT Team will be accompanying him. Patient said he wants to move into his own place. Patient also told writer he is taking the medicab for his I.D. Appointment tomorrow. In regards to going to school to further his education, patient told Probation officer he doesn't know yet which school he wants to attend. In regards to employment, patient said he  isn't interested working at the present time. In regards to his meds, patient told writer that the meds are helping him. Patient looked and sounded mentally stable and in good health.       Interventions:  Housing    Current Treatment Plan   Created/Updated On 01/31/2015   Extends to 07/13/2015         Plan:  Psychotherapy continues as described in care plan; plan remains the same.    NEXT APPT: Offsite visit for housing and support on 06/01/15.      Martha Clan

## 2015-05-30 NOTE — Telephone Encounter (Signed)
Pt notified and states  "I will be there"

## 2015-05-31 ENCOUNTER — Ambulatory Visit: Payer: Self-pay

## 2015-05-31 ENCOUNTER — Ambulatory Visit: Payer: Self-pay | Admitting: Infectious Diseases

## 2015-05-31 ENCOUNTER — Encounter: Payer: Self-pay | Admitting: Infectious Diseases

## 2015-05-31 VITALS — BP 118/78 | HR 90 | Temp 97.9°F | Resp 16 | Ht 72.0 in | Wt 156.0 lb

## 2015-05-31 DIAGNOSIS — B20 Human immunodeficiency virus [HIV] disease: Secondary | ICD-10-CM

## 2015-05-31 LAB — COMPREHENSIVE METABOLIC PANEL
ALT: 60 U/L — ABNORMAL HIGH (ref 0–50)
AST: 75 U/L — ABNORMAL HIGH (ref 0–50)
Albumin: 4.6 g/dL (ref 3.5–5.2)
Alk Phos: 94 U/L (ref 40–130)
Anion Gap: 18 — ABNORMAL HIGH (ref 7–16)
Bilirubin,Total: 0.6 mg/dL (ref 0.0–1.2)
CO2: 24 mmol/L (ref 20–28)
Calcium: 9.4 mg/dL (ref 9.0–10.3)
Chloride: 99 mmol/L (ref 96–108)
Creatinine: 0.71 mg/dL (ref 0.67–1.17)
GFR,Black: 136 *
GFR,Caucasian: 118 *
Glucose: 81 mg/dL (ref 60–99)
Lab: 6 mg/dL (ref 6–20)
Potassium: 4.3 mmol/L (ref 3.3–5.1)
Sodium: 141 mmol/L (ref 133–145)
Total Protein: 7.7 g/dL (ref 6.3–7.7)

## 2015-05-31 LAB — MAGNESIUM: Magnesium: 1.8 mEq/L (ref 1.3–2.1)

## 2015-05-31 LAB — PHOSPHORUS: Phosphorus: 3.8 mg/dL (ref 2.7–4.5)

## 2015-05-31 MED ORDER — EFAVIRENZ-EMTRICITAB-TENOFOVIR 600-200-300 MG PO TABS *I*
1.0000 | ORAL_TABLET | Freq: Every evening | ORAL | 2 refills | Status: DC
Start: 2015-05-31 — End: 2015-07-05
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
## 2015-05-31 NOTE — Progress Notes (Signed)
HIV CLINIC NOTE      Reason for Visit : Derek Walker is a 39 y.o. old Male who presents to the Infectious Disease Clinic for routine visit for HIV disease. He was previously followed at [facility] by Dr. Lajuana Carry. Last seen in June 2016.     He was diagnosed in 2013 from heterosexual contact after he had oral thrush. He has been taking Atripla since diagnosis. Previously was on bactrim for prophylaxis, but off since 2014.    He was restarted on Paxel and Zyprexa in 04/2015. Follows with [facility] for mental health issues.    He is complaining of decreased appetite for about 1 year, unintentional weight loss of 30 lbs in the same time frame. No recent nausea, vomiting, fevers chills, cough. He complains of diarrhea for about 1 year as well, with watery, non-bloody, non-mucoid stools mostly 3-5 times a day without abdominal discomfort. No recent antibiotics since onset. No prior hospitalizations or travel in the past year. No sick contacts. He denies family history of IBD, no personal history of IBS. Mother was diagnosed with colon cancer in her mid-40's.      Medications:     Medication reconciliation completed including asking the patient about OTC medications   and herbal supplements.     Pertinent medications include:   ART: Atripla   Prophylaxis: none, off Bactrim since 2014      Adherence:     Patient reports no missed doses of antiretroviral therapy in the past 4 days or 4 weeks. This is self-reported adherence of 100%, thus there is no need for adherence counseling to be performed. The patient reports no barriers to adherence. Last missed a couple doses in 2015 while incarcerated for 3 days.       Psychosocial:     Tobacco Use: 10 cigarettes per day x 20 years, no plans on quitting for now   Alcohol Use: occasional, 1-2 times per week, prefers beer   Illicit Substances: No recreational substances such as crack cocaine, heroin, marijuana, methamphetamines, ecstasy.   Sexual Activity: not  currently, last episode was in 2013 piror to HIV diagnosis, heterosexual contact      Lab Screening:     Hep A Ab: IgG neg 07/2012   Hep B SAb: neg 07/2012   Hep B SAg: neg 07/2012   Hep B Core Ab: neg 07/2012   Hep C Ab: neg 06/2014   Trofile: --   HLAB57*01: --   G6PD: --   Toxoplasma IgG: --   HIV Resistance: none in 08/2012       Health Maintenance:   TB screen: neg 06/2014   RPR: neg 02/2015   Lipid Profile: --   STD Screen GC/CT: neg 02/2015   PAP smear (anal/cervical): --   Vaccines:   Influenza: 2015, give now 05/2015   Hepatitis A: --   Hepatitis B: --   PPSV 23: --   PCV 13: --   Tetanus: --      ROS:  CONSTITUTIONAL:  POSITIVE weight loss, anorexia, NEGATIVE fever, chills, sweats, fatigue, skin problems, lymph node enlargement.   EYES: No visual changes, blurred vision, or floaters  ENT: No problems with mouth or teeth  RESPIRATORY: No cough, shortness of breath,or sputum production  CV: No chest pain, leg swelling, PND, orthopnea   GI: POSITIVE watery diarrhea, No nausea, vomiting, constipation, dysphagia or abdominal pain  GU: No urinary symptoms, genital lesions, hemorrhoids  NEURO/MS:  No muscle pain,  weakness, numbness in the extremities, edema.  No headache, memory loss, confusion.  PAIN: Denies any pain  PSYCH: POOR APPETITE, sleeping well.  Feels depressed.  No PTSD or suicidal ideation      Physical Exam:  Blood pressure 118/78, pulse 90, temperature 36.6 C (97.9 F), temperature source Temporal, resp. rate 16, height 1.829 m (6'), weight 70.8 kg (156 lb), SpO2 99 %.    GENERAL APPEARANCE: well appearing, in NAD   SKIN: Warm and dry, no lesions noted, multiple tattoos  HEENT: No scleral icterus, oropharynx clear- no thrush, good dentition   NECK: Supple without significant adenopathy   HEART: RRR without murmurs,  rubs, or gallops  LUNGS: Clear to auscultation  bilaterally.  ABDOMEN:  soft, non-tender, non-distended without palpable hepato-splenomegaly  LYMPH NODES: No  significant adenopathy palpated in the supraclavicular, inguinal, axillary areas.  EXTREMITIES: Without clubbing, cyanosis, or edema.  NEUROLOGIC: Alert, oriented,  speech is fluent; normal gait and muscle strength      Results:  CD4 -   Lab Results   Component Value Date    CD4A 186 (L) 03/03/2015    CD4A 203 (L) 07/01/2014    CD4A 141 (L) 03/08/2014     Viral load -   Lab Results   Component Value Date    HIV NEG 03/03/2015    HIV NEG 07/01/2014    HIV NEG 03/08/2014         Assessment/Plan :    1. HIV: Here for routine HIV disease   ART: continue Atripla   Prophylaxis: none   Control: CD 4- 186/19% ; VL- neg in 02/2015, recheck now   Safer sexual practices were discussed with the patient   Labs: recheck CMP, VL, T cell subset, TB quantiferon   Vaccines: give flu vaccine now, will obtain records from [facility] before giving any other vaccines    2. HEALTHY LIFESTYLE COUNSELING:   Tobacco, alcohol and substance abuse discussed   Depression screen: positive, follows with psychiatry clinic    3. DIARRHEA, ANOREXIA, WEIGHT LOSS  -differentials include anorexia and weight loss secondary to undertreated depression vs infectious diarrhea (parasitic vs bacterial), less likely secondary to advanced AIDS as last CD 4 count was almost 200, other etiologies could be IBS vs IBD  -check stool C. Difficile, O&P, stool culture, and if negative, will likely recheck again on follow-up    4. ANXIETY/DEPRESSION  -managed by psychiatry clinic, currently on paroxetine and olanzapine    Return to clinic: 2 weeks    Patient was seen and discussed with Dr. Karlyne Greenspan M.D.  Infectious Diseases Fellow  [facility]: #[id number]  05/31/2015 5:49 PM

## 2015-05-31 NOTE — Progress Notes (Signed)
Score Sheet for the Prescription Label    THURL BOEN  [id number]    Step 1 : EXPLAIN to the patient that it is important to understand basic information about their HIV disease in order to take better care of their health.  Some health information that is important to understand is your medications.  Give the patient the prescription label.  Then, say something like, "I would like to ask you a few questions about this information.  Your answers will help the provider come up with a plan to help you understand your medications. "    Step 2 : Read the following to the patient  Pretend this is a prescription label of a new medicine you just [facility].  I am going to ask you some questions about this new medicine.    Step 3 : Ask the patient if he would feel comfortable reading the prescription label or if he would like you to read it.    Step 4 : Ask the patient the below 6 questions and indicate whether they answered correctly or not.    1. Can you point to the medication name on the label? truvada  Answer : Truvada  2. How will you take this medication ( number of pills and number of times per day, with or without food) ? yes  Answer : 1 tablet by mouth once daily with or without food  3. If you are instructed by your provider to change your dose to 1 tablet every other day, how many tablets will you need in your bottle for a 30 day supply? 15  Answer : 15 tablets  4. If you take 1 tablet daily for 23 days how many pills should be left in your bottle? 7   Answer : 7 tablets (30 tablets-23 tablets = 7 tablets)  5. Looking at the date that this prescription was filled, on what date would you be due for your next refill if you take the medication as it says on the label? 4/18  Answer : Acceptable : January 01, 2013 +/- a few days  6. What side effect(s) might you expect from this medication? diarrhea  Answer : Diarrhea    Step 5 : Tally the Score - Number of Correct Answers (Deduct 2 points if  the patient is unable to read the material alone) : 6    Interpretation:   Score of 0-1 : Indicates high likelihood of limited literacy  Score of 2-4 : Indicates the possibility of limited literacy  Score of 5-6 : Indicates high likelihood of adequate literacy    Did patient read the prescription? YES  Language: ENGLISH If spanish, note intervention below.   Did Screener read the materials? NO Language: N/A    Health Literacy Intervention recommended:   none    Velda Shell  05/31/2015

## 2015-06-01 ENCOUNTER — Ambulatory Visit: Payer: Self-pay

## 2015-06-01 LAB — LYMPHOCYTE SUBSET (T CELLS ONLY)
CD4#: 181 cells/uL — ABNORMAL LOW (ref 496–2186)
CD4%: 18 % — ABNORMAL LOW (ref 32–71)
CD4/CD8: 0.4 — ABNORMAL LOW (ref 0.7–3.0)
CD8#: 475 cells/uL (ref 177–1137)
T LYM #(CD3): 700 cells/uL — ABNORMAL LOW (ref 754–2810)
T LYM %(CD3): 69 % (ref 54–87)
T LYM# (CD3+4+8+): 7 cells/uL
T LYM# (CD3+4-8-): 50 cells/uL
T LYM% (CD3+4+8+): 1 %
T LYM% (CD3+4-8-): 5 %
T Suppress %(CD8): 47 % — ABNORMAL HIGH (ref 10–38)

## 2015-06-01 LAB — HIV VIRAL LOAD
HIV-1 PCR log: NEGATIVE copies/mL
HIV-1 PCR: NEGATIVE copies/mL

## 2015-06-01 NOTE — Progress Notes (Signed)
Behavioral Health Progress Note     LENGTH OF SESSION: 60 minutes    Contact Type:  Location: Off Site    Face to Face     Problem(s)/Goals Addressed from Treatment Plan:    Problem 1:   R PSY TP PROBLEM 01/31/2015   1ST TREATMENT PLAN PROBLEM unstable housing       Goal for this problem:    R PSY TP GOAL 01/31/2015   1ST TREATMENT PLAN GOAL "I will get my own apartment asap".         Progress towards this goal: Problem resolving. Comment Client put in application for subsidized housing     Mental Status Exam:  APPEARANCE: Appears stated age, Well-groomed, Casual  ATTITUDE TOWARD INTERVIEWER: Cooperative  MOTOR ACTIVITY: WNL (within normal limits)  EYE CONTACT: Direct  SPEECH: Normal rate and tone  AFFECT: Full Range  MOOD: Normal  THOUGHT PROCESS: Normal  THOUGHT CONTENT: No unusual themes  PERCEPTION: Within normal limits  CURRENT SUICIDAL IDEATION: patient denies  CURRENT HOMICIDAL IDEATION: Patient denies  ORIENTATION: Alert and Oriented X 3.  CONCENTRATION: WNL  MEMORY:   Recent: intact   Remote: intact  COGNITIVE FUNCTION: Average intelligence  JUDGMENT: Impaired -  moderate  IMPULSE CONTROL: Fair  INSIGHT: Fair    Risk Assessment:  ASSESSMENT OF RISK FOR SUICIDAL BEHAVIOR  Changes in risk for suicide from baseline Formulation of Risk and/or previous intake, including newly identified risk, if any: none      Session Content::  Client and Clinical research associate went to [employer] where client submitted an application for an apartment. He completed the interview and he provided all of the necessary paperwork and identification. Client was organized and he was able to articulate his reasons for wanting to move into [facility]. He also told Clinical research associate that the house he is currently living in with his mother and family is in foreclosure. Client confirmed that he attended his first appointment at [facility] and he was very happy with the staff. Our next goal is to have client make an appointment at [facility] to complete the transfer process.       Interventions:  Problem Solving Therapy skills, Supportive Psychotherapy    Current Treatment Plan   Created/Updated On 01/31/2015   Extends to 07/13/2015         Plan:  Psychotherapy continues as described in care plan; plan remains the same.    NEXT APPT: 06/05/15      Lenice Pressman

## 2015-06-06 ENCOUNTER — Ambulatory Visit: Payer: Self-pay

## 2015-06-07 LAB — TB AG T-CELL STIMULATION: TB Ag T-Cell Stimulation: 0

## 2015-06-08 ENCOUNTER — Ambulatory Visit: Payer: Self-pay

## 2015-06-08 DIAGNOSIS — F32A Depression, unspecified: Secondary | ICD-10-CM

## 2015-06-08 DIAGNOSIS — F419 Anxiety disorder, unspecified: Secondary | ICD-10-CM

## 2015-06-08 NOTE — Progress Notes (Signed)
Behavioral Health Progress Note     LENGTH OF SESSION: 45 minutes    Contact Type:  Location: Off Site    Face to Face     Problem(s)/Goals Addressed from Treatment Plan:    Problem 1:   R PSY TP PROBLEM 01/31/2015   1ST TREATMENT PLAN PROBLEM unstable housing       Goal for this problem:    R PSY TP GOAL 01/31/2015   1ST TREATMENT PLAN GOAL "I will get my own apartment asap".         Progress towards this goal: Problem resolving. Comment Client put in application for subsidized housing     Mental Status Exam:  APPEARANCE: Appears stated age, Well-groomed, Casual  ATTITUDE TOWARD INTERVIEWER: Cooperative  MOTOR ACTIVITY: WNL (within normal limits)  EYE CONTACT: Direct  SPEECH: Normal rate and tone  AFFECT: Full Range  MOOD: Normal  THOUGHT PROCESS: Normal  THOUGHT CONTENT: No unusual themes  PERCEPTION: Within normal limits  CURRENT SUICIDAL IDEATION: patient denies  CURRENT HOMICIDAL IDEATION: Patient denies  ORIENTATION: Alert and Oriented X 3.  CONCENTRATION: WNL  MEMORY:   Recent: intact   Remote: intact  COGNITIVE FUNCTION: Average intelligence  JUDGMENT: Impaired -  moderate  IMPULSE CONTROL: Fair  INSIGHT: Fair    Risk Assessment:  ASSESSMENT OF RISK FOR SUICIDAL BEHAVIOR  Changes in risk for suicide from baseline Formulation of Risk and/or previous intake, including newly identified risk, if any: none      Session Content::  Client reports zero substance use but he continues to smoke tobacco. He is eating better and is currently eating one meal a day. Although he is still not hungry he has found that he can eat if he eats slowly while watching television. Client and Clinical research associate called [facility] to follow up on his rental application but his application is still being processed. Client tried to make an appointment at [facility] but his request to transfer his care has not been approved yet. Client will be meeting with Adelene Amas next Thursday on 06/15/15 for a psycho pharm  appointment so client and writer will meet on 06/13/15 at his home.       Interventions:  Problem Solving Therapy skills, Supportive Psychotherapy    Current Treatment Plan   Created/Updated On 01/31/2015   Extends to 07/13/2015         Plan:  Psychotherapy continues as described in care plan; plan remains the same.    NEXT APPT: 06/13/15      Lenice Pressman

## 2015-06-13 ENCOUNTER — Ambulatory Visit: Payer: Self-pay

## 2015-06-15 ENCOUNTER — Encounter: Payer: Self-pay | Admitting: Psychiatry

## 2015-06-15 ENCOUNTER — Ambulatory Visit: Payer: Self-pay | Admitting: Psychiatry

## 2015-06-15 DIAGNOSIS — F3289 Other specified depressive episodes: Secondary | ICD-10-CM

## 2015-06-15 DIAGNOSIS — F419 Anxiety disorder, unspecified: Secondary | ICD-10-CM

## 2015-06-15 DIAGNOSIS — F4310 Post-traumatic stress disorder, unspecified: Secondary | ICD-10-CM

## 2015-06-15 MED ORDER — GABAPENTIN 300 MG PO CAPSULE *I*
300.0000 mg | ORAL_CAPSULE | Freq: Three times a day (TID) | ORAL | 2 refills | Status: DC
Start: 2015-06-15 — End: 2016-03-29

## 2015-06-15 MED ORDER — PRAZOSIN HCL 1 MG PO CAPS *I*
1.0000 mg | ORAL_CAPSULE | Freq: Every evening | ORAL | 2 refills | Status: DC
Start: 2015-06-15 — End: 2016-03-29

## 2015-06-15 MED ORDER — NICOTINE POLACRILEX 4 MG MT LOZG *I*
4.0000 mg | LOZENGE | OROMUCOSAL | 2 refills | Status: DC | PRN
Start: 2015-06-15 — End: 2016-10-25

## 2015-06-15 MED ORDER — OLANZAPINE 15 MG PO TABS *I*
15.0000 mg | ORAL_TABLET | Freq: Every day | ORAL | 2 refills | Status: DC
Start: 2015-06-15 — End: 2015-06-15

## 2015-06-15 MED ORDER — OLANZAPINE 15 MG PO TABS *I*
15.0000 mg | ORAL_TABLET | Freq: Every evening | ORAL | 2 refills | Status: DC
Start: 2015-06-15 — End: 2016-03-29
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
## 2015-06-21 ENCOUNTER — Ambulatory Visit: Payer: Self-pay | Admitting: Infectious Diseases

## 2015-06-22 ENCOUNTER — Ambulatory Visit: Payer: Self-pay

## 2015-06-26 ENCOUNTER — Telehealth: Payer: Self-pay | Admitting: Infectious Diseases

## 2015-06-26 NOTE — Telephone Encounter (Signed)
Derek Walker is calling to schedule an appointment with Dr. Blair Heys. The patient would like to be seen for a follow up he had previously missed on 10/5. No available schedule for writer to book.    Please call the patient to schedule at [phone].Marland Kitchen

## 2015-06-26 NOTE — Telephone Encounter (Signed)
Called and left message for patient to call back to schedule appointment with Dr. Gwenyth Bouillon.

## 2015-06-27 ENCOUNTER — Ambulatory Visit: Payer: Self-pay

## 2015-06-29 ENCOUNTER — Ambulatory Visit: Payer: Self-pay
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
## 2015-07-03 NOTE — Progress Notes (Signed)
[facility] Psychosocial Update     Psychosocial Update (for the preceding 6 months)  07/03/2015    Derek Walker is individually with Derek Walker    45 minutes/time    Type of visit: Face to face / Office Visit    Focus: Substance Use and Health     Current Medications  Current Outpatient Prescriptions   Medication Sig    gabapentin (NEURONTIN) 300 MG capsule Take 1 capsule (300 mg total) by mouth 3 times daily    prazosin (MINIPRESS) 1 MG capsule Take 1 capsule (1 mg total) by mouth nightly   for nightmares    OLANZapine (ZYPREXA) 15 MG tablet Take 1 tablet (15 mg total) by mouth nightly    nicotine polacrilex (COMMIT) 4 MG lozenge Place 1 lozenge (4 mg total) inside cheek as needed for Smoking cessation   Max daily dose: 20 lozenges No more than 5 pieces in 6hrs.    efavirenz-emtrictabine-tenofovir (ATRIPLA) 600-200-300 MG per tablet Take 1 tablet by mouth nightly on an empty stomach    PARoxetine (PAXIL) 20 MG tablet Take 1 tablet (20 mg total) by mouth every morning     No current facility-administered medications for this visit.        Psychosocial History and Update  Medical History and Pain Update: Client reports no current physical pain    Living Arrangements: Client lives with his mother and family    Activities/Programs: Client is currently active with the [facility] Team.    Current symptoms/impairments: Client is socially isolated, hypervigilant and depressed.     Substance Use:    Current use of alcohol or drugs: Unknown but client reports no current use    Past use of alcohol or drugs (note substances, approximate dates): Yes    Drug treatment history:   Approximate Dates:  Unknown   Treatment and Facility:  [facility]   Additional Comments: Client's substance use appears to be a significant part of his past but it is unclear at this time whether client has a substance use disorder or whether his substance use is PTSD related and episodic. He  is new to the ACT team and his evaluation is ongoing.     Mental Status Exam  APPEARANCE: Appears stated age, Well-groomed, Casual  ATTITUDE TOWARD INTERVIEWER: Cooperative  MOTOR ACTIVITY: WNL (within normal limits)  EYE CONTACT: Direct  SPEECH: Normal rate and tone  AFFECT: Decreased Range  MOOD: Normal  THOUGHT PROCESS: Normal  THOUGHT CONTENT: No unusual themes  PERCEPTION: Within normal limits  CURRENT SUICIDAL IDEATION: patient denies  CURRENT HOMICIDAL IDEATION: Patient denies  ORIENTATION: Alert and Oriented X 3.  CONCENTRATION: WNL  MEMORY:   Recent: intact   Remote: intact  COGNITIVE FUNCTION: Average intelligence  JUDGMENT: Impaired -  moderate  IMPULSE CONTROL: Fair  INSIGHT: Fair    Diagnosis    ICD-10-CM ICD-9-CM   1. Depression, unspecified depression type F32.9 311   2. Anxiety F41.9 300.00       Without this treatment, is the patient likely to deteriorate, relapse, or require hospitalization? yes    Assessment  (progress towards goals and objectives)    Gains/Advances: Client has started to engage in his treatment with the ACT team. He is speaking more frankly and is improving his attendance to scheduled appointments.     Declines: None    Plan  As per Comprehensive Treatment Plan

## 2015-07-05 ENCOUNTER — Encounter: Payer: Self-pay | Admitting: Infectious Diseases

## 2015-07-05 ENCOUNTER — Ambulatory Visit: Payer: Self-pay | Admitting: Infectious Diseases

## 2015-07-05 VITALS — BP 110/75 | HR 97 | Temp 98.6°F | Ht 71.0 in | Wt 157.8 lb

## 2015-07-05 DIAGNOSIS — R197 Diarrhea, unspecified: Secondary | ICD-10-CM

## 2015-07-05 DIAGNOSIS — B20 Human immunodeficiency virus [HIV] disease: Secondary | ICD-10-CM

## 2015-07-05 MED ORDER — EFAVIRENZ-EMTRICITAB-TENOFOVIR 600-200-300 MG PO TABS *I*
1.0000 | ORAL_TABLET | Freq: Every evening | ORAL | 2 refills | Status: DC
Start: 2015-07-05 — End: 2015-10-11
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
## 2015-07-05 NOTE — Patient Instructions (Signed)
Have stool samples done tomorrow    Continue Atripla    Follow-up in 3 months

## 2015-07-06 ENCOUNTER — Ambulatory Visit: Payer: Self-pay

## 2015-07-07 NOTE — Progress Notes (Signed)
[facility] MISSED/CANCELLED APPOINTMENT     Name: Derek NielsenRALPH D Ilg  MRN: [id number]   DOB: Jan 20, 1976    Date of Scheduled Service: 07/06/15    [name] was a no show for today's appointment.  I have informed ACT team.  No call placed, as a pattern of missed appointments is more consistent than kept appointments over a period of several months.  Writer to f/u with primary ACT counselor when he returns from vacation to indentify next steps.      Additional Information:    Not applicable

## 2015-07-10 ENCOUNTER — Encounter: Payer: Self-pay | Admitting: Psychiatry

## 2015-07-10 ENCOUNTER — Ambulatory Visit: Payer: Self-pay | Admitting: Psychiatry

## 2015-07-10 NOTE — Progress Notes (Signed)
Collateral Contact      ACT team went to patients home for a home visit. Another male answered the door stating that patient was not home at this time. He states that patient has been doing well overall and that he would relay to patient that the team came for a home visit.     Derek Lerneroopa Reshaun Briseno, DO  Case seen and discussed with Dr. Melchor AmourWeisman.

## 2015-07-13 DIAGNOSIS — F1010 Alcohol abuse, uncomplicated: Secondary | ICD-10-CM

## 2015-07-13 DIAGNOSIS — F329 Major depressive disorder, single episode, unspecified: Secondary | ICD-10-CM

## 2015-07-13 DIAGNOSIS — F4310 Post-traumatic stress disorder, unspecified: Secondary | ICD-10-CM

## 2015-07-13 DIAGNOSIS — F1210 Cannabis abuse, uncomplicated: Secondary | ICD-10-CM

## 2015-07-14 ENCOUNTER — Other Ambulatory Visit: Payer: Self-pay | Admitting: Gastroenterology

## 2015-07-14 ENCOUNTER — Encounter: Payer: Self-pay | Admitting: Family Medicine

## 2015-07-14 ENCOUNTER — Ambulatory Visit: Payer: Self-pay | Admitting: Family Medicine

## 2015-07-14 VITALS — BP 112/69 | HR 91 | Temp 97.5°F | Wt 162.0 lb

## 2015-07-14 DIAGNOSIS — K219 Gastro-esophageal reflux disease without esophagitis: Secondary | ICD-10-CM

## 2015-07-14 DIAGNOSIS — F419 Anxiety disorder, unspecified: Secondary | ICD-10-CM

## 2015-07-14 DIAGNOSIS — B20 Human immunodeficiency virus [HIV] disease: Secondary | ICD-10-CM

## 2015-07-14 DIAGNOSIS — Z0000 Encounter for general adult medical examination without abnormal findings: Secondary | ICD-10-CM

## 2015-07-14 DIAGNOSIS — M79671 Pain in right foot: Secondary | ICD-10-CM

## 2015-07-14 DIAGNOSIS — B353 Tinea pedis: Secondary | ICD-10-CM

## 2015-07-14 DIAGNOSIS — R7401 Elevation of levels of liver transaminase levels: Secondary | ICD-10-CM

## 2015-07-14 DIAGNOSIS — F4310 Post-traumatic stress disorder, unspecified: Secondary | ICD-10-CM

## 2015-07-14 DIAGNOSIS — F3289 Other specified depressive episodes: Secondary | ICD-10-CM

## 2015-07-14 MED ORDER — LIDOCAINE 5 % EX OINT *I*
TOPICAL_OINTMENT | Freq: Three times a day (TID) | CUTANEOUS | 2 refills | Status: DC | PRN
Start: 2015-07-14 — End: 2015-09-12

## 2015-07-14 MED ORDER — TERBINAFINE HCL 1 % EX CREA *I*
TOPICAL_CREAM | Freq: Two times a day (BID) | CUTANEOUS | 1 refills | Status: DC
Start: 2015-07-14 — End: 2016-10-25

## 2015-07-14 MED ORDER — OMEPRAZOLE 20 MG PO CPDR *I*
20.0000 mg | DELAYED_RELEASE_CAPSULE | Freq: Every evening | ORAL | 5 refills | Status: DC
Start: 2015-07-14 — End: 2016-10-25
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
## 2015-07-16 LAB — EKG 12-LEAD
P: 72 degrees
QRS: 76 degrees
Rate: 83 {beats}/min
Severity: NORMAL
Severity: NORMAL
T: 59 degrees

## 2015-07-18 ENCOUNTER — Ambulatory Visit: Payer: Self-pay

## 2015-07-20 ENCOUNTER — Encounter: Payer: Self-pay | Admitting: Psychiatry

## 2015-07-20 ENCOUNTER — Ambulatory Visit: Payer: Self-pay | Admitting: Psychiatry

## 2015-07-20 DIAGNOSIS — F333 Major depressive disorder, recurrent, severe with psychotic symptoms: Secondary | ICD-10-CM | POA: Insufficient documentation

## 2015-07-20 NOTE — Progress Notes (Signed)
Behavioral Health Psychopharmacology Follow-up     Length of Session: 20 minutes.    Diagnosis Addressed    ICD-10-CM ICD-9-CM   1. Major psychotic depression, recurrent F33.3 296.34       Chief Complaint: Patient states "I'm mad that [facility] isn't here... I'm overwhelmed, I'm overwhelmed and I'm freaking out... I was drinking last night with my brother... "        There were no vitals taken for this visit.  Wt Readings from Last 3 Encounters:   07/14/15 73.5 kg (162 lb)   07/05/15 71.6 kg (157 lb 12.8 oz)   05/31/15 70.8 kg (156 lb)       Recent History and Response to Medications  Patient is here for psychopharmacology follow up. Patient states his mother is "getting kicked out" and they have 21 days to look for another housing. Due to this housing stressor as well as other family related issues that patient states he was drinking all night with her brother. "We had a gallon of liquor with my brother, my aunty, with other aunty... I just want to get out of there..." He states he was drinking until 3 to 4AM. Patient is very talkative than usual and difficult to redirect. He often repeated himself about how he is disappointed that [facility] is not here and how he needs to find another housing in the next 90 days.     At the previous appointment was started on prazosin for his nightmares. Today he reports he continues to have nightmres and has not noticed any changes with the frequency or intensity of the dreams. His most recent BP from 07/14/15 was 112/169. Reviewed with patient that we could raise the prazosin dose to 9m; however, patient states he doesn't want to go higher at this time. Patient was also started on nicotine polacrilex 463mPO PRN for smoking cessation. He states he continues to smoke. When he is asked if the medication has helped carve some cravings, she states, "I don't know... I don't know.."   Patient' olanzapine dose was also increase to 1514mO HS to target his mood/racing thoughts and impulsivity, but  he is unsure if the medication has been effective or not.   Although patient insisted that he is not "drunk" he did not preset as his baseline and this likely because she is still under the influence of alcohol.             Current use of alcohol or drugs: no    ENERGY: Poor  SLEEP: Insomnia:  Initial.  "To be honest with you  I"m only sleeping 3 or 4 hours..."   APPETITE: "I just have the appetite, actually I gained 5lbs.  WEIGHT: Gain. reprots he gained 5 bls  SEXUAL FUNCTION: not asked  ENJOYMENT/INTEREST: Poor    Review of Systems:  Pertinent items are noted in HPI.    Current Medications  Current Outpatient Prescriptions   Medication Sig    lidocaine (XYLOCAINE) 5 % ointment Apply topically 3 times daily as needed for Pain   Apply to top of right foot and small toes    terbinafine (LAMISIL) 1 % cream Apply topically 2 times daily    omeprazole (PRILOSEC) 20 MG capsule Take 1 capsule (20 mg total) by mouth nightly    efavirenz-emtrictabine-tenofovir (ATRIPLA) 600-200-300 MG per tablet Take 1 tablet by mouth nightly on an empty stomach    gabapentin (NEURONTIN) 300 MG capsule Take 1 capsule (300 mg total) by mouth 3 times  daily    prazosin (MINIPRESS) 1 MG capsule Take 1 capsule (1 mg total) by mouth nightly   for nightmares    OLANZapine (ZYPREXA) 15 MG tablet Take 1 tablet (15 mg total) by mouth nightly    nicotine polacrilex (COMMIT) 4 MG lozenge Place 1 lozenge (4 mg total) inside cheek as needed for Smoking cessation   Max daily dose: 20 lozenges No more than 5 pieces in 6hrs.    PARoxetine (PAXIL) 20 MG tablet Take 1 tablet (20 mg total) by mouth every morning     No current facility-administered medications for this visit.        Side Effects  Sedation and Other: Patient states he is often sleeping "a wrong places..."     Mental Status  APPEARANCE: Casual  ATTITUDE TOWARD INTERVIEWER: Cooperative  MOTOR ACTIVITY: WNL (within normal limits)  EYE CONTACT: Direct  SPEECH: he is very talkative today  as if he is still inebriated  AFFECT: Neutral and Appropriate  MOOD: Normal and Sad  THOUGHT PROCESS: Concrete  THOUGHT CONTENT: Preoccupations  PERCEPTION: Within normal limits  ORIENTATION: Alert and Oriented X 3.  CONCENTRATION: Good  MEMORY:   Recent: intact   Remote: intact  COGNITIVE FUNCTION: Average intelligence  JUDGMENT: Intact  IMPULSE CONTROL: Fair  INSIGHT: Fair    Risk Assessment  Self Injury: Patient Denies  Suicidal Ideation: Patient Denies  Homicidal Ideation: Patient Denies  Aggressive Behavior: Patient Denies   Changes in risk for suicide form baseline Formulation of Risk and/or previous intake, including newly identified risk, if any: none.       If any of the answers above are Yes, is there access to lethal means?   No    Results  Lab Results   Component Value Date    CHOL 189 09/01/2012    HDL 90 09/01/2012    LDLC 88 09/01/2012    TRIG 54 09/01/2012    CHHDC 2.1 09/01/2012    GLU 81 05/31/2015    HA1C 5.2 07/27/2012    WBC 5.0 03/03/2015    ASEGR 3.1 03/03/2015     No results for input(s): TSH in the last 8760 hours.        Lab results: 05/31/15  1235   SODIUM 141   POTASSIUM 4.3   CHLORIDE 99   CO2 24   UN 6   CREATININE 0.71   GFR,CAUCASIAN 118   GFR,BLACK 136   GLUCOSE 81   CALCIUM 9.4   TOTAL PROTEIN 7.7   ALBUMIN 4.6   ALT 60*   AST 75*   ALK PHOS 94   BILIRUBIN,TOTAL 0.6         Current Treatment Plan   Created/Updated On 01/31/2015   Next Treatment Plan Due 07/13/2015       Assessment:  Kelly is a 39 year old african [place] male with a history of PTSD, depression, anxiety and nicotine dependence presents to [facility] for his scheduled psychopharmacology follow up who has been drinking alcohol with his family all night, until 4AM. He did not appear anxious today, but rather talking louder than usual and repeating the eviction issues and other family related to issues to Probation officer throughout the visit. It's likely he was still under the influence of alcohol. Patient was easily  redirectable; however, he was also easily distracted from the issues that he wanted to discussed. He did not endorse any thoughts of self harm or SI/HI/VI. He continues to experience nightly nightmares despite being on  prazosin 25m PO HS; currently he decline to try on a higher dose. His mood is even/neutral to possibly irritable as LYork Cerisewas not present today and about his family. He did not appears to be anxious today. Will not make any medication changes today given his presentation today.         Labs reviewed with patient:  Last AIMS exam performed on:   Reviewed and confirmed the PFSH/ROS with the patient.       Plan and Rationale:  1. Continue with current medication regimen of olanzapine 175mPO HS, paroxetine 2036mO AM, gabapentin PO TID for anixiety, prazosin 1mg98m HS for nightmares, and nicotine polacrilex 4mg 71mPRN for smoking cessation  2. Continue to provide motivational interviewing related to smoking cessation  3. Continue to abstain from alcohol use  4. Provided informed consent for the above recommended psychiatric medications - following review of common risks/side effects and benefits.  5. Next home visit on Tuesday 07/25/15 for medication and symptom management    RalphElisandroed with above treatment plan as reviewed above.

## 2015-07-21 NOTE — Progress Notes (Signed)
Behavioral Health Progress Note     LENGTH OF SESSION: 45 minutes    Contact Type:  Location: On Site    Face to Face     Problem(s)/Goals Addressed from Treatment Plan:    Problem 3:    R PSY TP PROBLEM 3 01/31/2015   3RD TREATMENT PLAN PROBLEM poor physical and mental health       Goal for this problem:    R PSY TP GOAL 3 01/31/2015   3RD TREATMENT PLAN GOAL "I will get my health back in order (nutrition, weight, exercise)".       Progress towards this goal: Problem resolving. Comment Client is starting to attend medical appointments    Mental Status Exam:  APPEARANCE: Appears stated age, Well-groomed  ATTITUDE TOWARD INTERVIEWER: Cooperative  MOTOR ACTIVITY: WNL (within normal limits)  EYE CONTACT: Direct  SPEECH: Normal rate and tone  AFFECT: Full Range  MOOD: Normal  THOUGHT PROCESS: Normal  THOUGHT CONTENT: No unusual themes  PERCEPTION: Within normal limits  CURRENT SUICIDAL IDEATION: patient denies  CURRENT HOMICIDAL IDEATION: Patient denies  ORIENTATION: Alert and Oriented X 3.  CONCENTRATION: WNL  MEMORY:   Recent: intact   Remote: intact  COGNITIVE FUNCTION: Average intelligence  JUDGMENT: Impaired -  moderate  IMPULSE CONTROL: Fair  INSIGHT: Fair    Risk Assessment:  ASSESSMENT OF RISK FOR SUICIDAL BEHAVIOR  Changes in risk for suicide from baseline Formulation of Risk and/or previous intake, including newly identified risk, if any: none      Session Content::  Writer and Derek Walker visited client at his home. He appeared to be intoxicated. He said he felt uncomfortable when he had his physical exam recently at [facility]. When client was asked what occurred to make him feel uncomfortable he reported typical exam procedure. Writer attempted to explain some of his concerns but client was too drunk to converse. He stated he was going to attend his upcoming 07/20/15 appointment with writer where the conversation about his medical exam would continue.       Interventions:  Problem Solving  Therapy skills, Provided Psychoeducation, Supportive Psychotherapy    Current Treatment Plan   Created/Updated On 01/31/2015   Next Treatment Plan Due 07/13/2015         Plan:  Psychotherapy continues as described in care plan; plan remains the same.    NEXT APPT: 07/06/15      Derek Walker

## 2015-07-25 ENCOUNTER — Ambulatory Visit: Payer: Self-pay

## 2015-07-26 NOTE — BH Treatment Plan (Addendum)
[facility] Treatment [facility]     Date of Plan:   R PSY TP DATE FROM 07/26/2015   FROM 07/13/2015      Created/Updated On 07/26/2015   Next Treatment Plan Due 01/11/2016       Diagnostic Impression    ICD-10-CM ICD-9-CM   1. PTSD (post-traumatic stress disorder) F43.10 309.81   2. Major depression F32.9 296.20   3. Alcohol abuse F10.10 305.00   4. Cannabis abuse F12.10 305.20       Strengths  Strengths derived from the assessment include: Client is intelligent and is motivated to take care of himself. He has been consistent in attending his ACT appointments and has made progress in defining his problems and which treatment issues he wants to work on.     Problem Areas  *At least one problem must be targeted toward risk reduction if Formulation of Risk or any other previous exam indicated special monitoring or intervention for suicide and/or violence risk indicated.    PROBLEM AREAS (choose and describe relevant):  BEHAVIOR: Client has a history of finding it difficult to engage in treatment  HOUSING:Client has been reporting that he will have to look for housing in the near future.   OTHER: Substance use  - alcohol  ________________________________________________________________  R PSY TP PROBLEM 07/26/2015   1ST TREATMENT PLAN PROBLEM unstable housing        R PSY TP GOAL 07/26/2015   1ST TREATMENT PLAN GOAL "I will get my own apartment asap".         The rationale for addressing this problem is that resolving it will (select all that apply):  Reduce symptoms of disorder, Reduce functional impairment associated with disorder, Decrease likelihood of hospitalization, Facilitate transfer skills learned in therapy to everday life and Is a key motivational factor for the patient's participation in treatment      Progress toward goal(s): This goal will be discontinued. Derek Walker's application for subsidized housing was denied due to his credit history and he has taken no further steps to seek housing (Please see notes  by Defilippo and Kenslie Abbruzzese dated 02/28/15, 04/11/15, 05/02/15, 06/01/15, 07/27/15). Although the house which Derek Walker shares with his mother and other family members is foreclosing and he will need to move in the future, Derek Walker seldom discusses housing and he has not looked for an apartment with the exception of [facility] which rejected his application. One reason for his inconsistent efforts is due to his anxiety and hypervigilance. Another reason is his credit history which severly limits his options. Client was assisted with obtaining a credit report and we have made plans to contact creditors to set up small regular payments (please see 07/27/15 Sevan Mcbroom note). Another possible reason for client's inactivty towards this goal may be due to his substance use although writer has only observed client intoxicated once (please see Emlyn Maves 07/18/15 note). Throughout the past six months client has been almost exclusively focused on maintaining his physical health. [facility] and [facility]. Since this has understandly been his priority it has been decided to drop goal #1 until client feels he has his physical health and treatment better under control. This goal will be revisited in the future if necessary while he exclusively works on Goal 3.    1 a. Measurable Objectives : I will follow through on using the housing services of [name]).   Date established: 01/11/15  Target date: 04/12/15   Attained or Revised? Discontinue. Derek Walker does not seem interested in following through with [employer] at this time. Please see above Progress toward goal for details.     1 b. Measurable Objectives : I will actively work on steps each week: search apartment listings, phone calls, tour listings, complete applications, etc.   Date established: 01/11/15   Target date: 04/12/15   Attained or Revised?  Discontinue.: Derek Walker does not seem interested in searching for apartments at this time. Please see above Progress toward goal for details.     1 c. Measurable Objectives : I will meet weekly with ACT staff to receive assistance with the steps outlined in objective 1b, assistance with a security deposit and negotiating with landlords    Date established: 01/11/15   Target date: 04/12/15    Attained or Revised? Discontinue.: Derek Walker does not seem interested in searching for apartments at this time. This objective has been discontinued. Please see above Progress toward goal for details.     Interventions:  1. ACT will access wrap around funds for a security deposit or immediate housing needs on a one time basis  2. ACT will provide assistance and education on finding safe affordable housing by assisting him with researching suitable locations based on his needs and preferences.   3. ACT will assist client in negotiating leases, researching methods of paying his rent, purchasing household necessities and developing a relationship with his landlord.       R PSY TP PROBLEM 2 07/26/2015   2ND TREATMENT PLAN PROBLEM undereducated       R PSY TP GOAL 2 07/26/2015   2ND TREATMENT PLAN GOAL "I will apply to go back to school within six months".       The rationale for addressing this problem is that resolving it will (select all that apply):  Reduce symptoms of disorder, Reduce functional impairment associated with disorder, Decrease likelihood of hospitalization, Facilitate transfer skills learned in therapy to everday life and Is a key motivational factor for the patient's participation in treatment    Progress toward goal(s): This goal is discontinued. Derek Walker has expressed no interest in this goal for the past six months. His encounters with ACT indicate that he never expressed interest again after his intial treatment plan was created. His continued main focus has been to become engaged in medical treatment for HIV and to maintain  his health (please see Defilippo 03/14/15 note). Client's desire to further his education is an important goal and he will be less distracted and more capable of taking steps towards this goal after he has improved his health. ACT supprorts client's decision to work on his health and we have suggested that client focus on one goal at a time which has been detailed in Goal #4.     2 a. Measurable Objectives : I will stablize housing and other basic needs   Date established: 01/11/15   Target date: 04/12/15   Attained or Revised? Discontinue: Please see Progress  Towards goal for details.    2 b. Measurable Objectives : I will explore options, such as [facility] or [facility], getting information I need.    Date established: 01/11/15   Target date: 07/13/15   Attained or Revised? Discontinue: Please see Progress  Towards goal for details.    2 c. Measurable Objectives : I will break down a plan into manageable steps    Date established: 01/11/15   Target date: 07/13/15   Attained  or Revised? Discontinue: Please see Progress  Towards goal for details.    2 d Measurable Objectives : I will identify skills I will need to be ready for school and get help if needed.     Date established: 01/11/15   Target date: 07/13/15   Attained or Revised? Discontinue: Please see Progress  Towards goal for details.    Interventions    1. ACT will provide information, linkage and referral to educational programs and resources as needed  2. ACT will provide coaching and counseling to plan and carry out steps  3. ACT vocational specialist and / or staff wil provide education / career counseling as needed    .........................................................................................................................................Marland Kitchen    R PSY TP PROBLEM 3 07/26/2015   3RD TREATMENT PLAN PROBLEM poor physical and mental health       R PSY TP GOAL 3 07/26/2015   3RD TREATMENT PLAN GOAL "I will get my health back in order (nutrition, weight,  exercise)".     Revised: "I want to maintain a connection with a doctor and ID Clinic to feel healthier"    The rationale for addressing this problem is that resolving it will (select all that apply):  Reduce symptoms of disorder, Reduce functional impairment associated with disorder, Decrease likelihood of hospitalization, Facilitate transfer skills learned in therapy to everday life and Is a key motivational factor for the patient's participation in treatment    Progress toward goal(s): Revised Goal and Continue.  Derek Walker made progress in all of his former goals (#1, #2) but he is not invested in them at this time, because he has identified that his medical and infectious disease issues have become a priority for him. He is aware of his housing issues and has attempted to secure subsidized housing but has not gone beyond the one attempt. Derek Walker has not attempted to go back to school but he has been very invested in taking care of his physical health. For these reasons it has been decided that focusing on one goal, that he personally established, would allow client to work on what he is motivated to work on and it would allow him to experience success in one area until he is ready to work on other goals. In meeting with Derek Sexton, this writer has identified through the use of IDDT therapeutic approaches that Derek Walker has underlying PTSD symptoms that impacts his substance abuse, and his on going substance use is negatively affecting his medical health.  Derek Walker has done an excellent job of attending his [facility] appointments and he has successfully transferred his primary medical care to [facility] Psych[facility] which has helped him integrate his care.  Therefore, this new revision to a goal focused primarily on his health, and the following objectives are more in tune with Derek Walker's motivations, and with the ACT model.      3 a. Measurable Objectives : I will make a plan for improving his nutrition to gain weight and promote  health.   Date established: 01/11/15   Target date: 07/13/15   Attained or Revised? Revised as follows: Derek Walker will follow medical recommendations for improving health and weight gain, per his medical providers.      3 b. Measurable Objectives : I will exercise at least 3 times per week ( join gym)   Date established: 01/11/15   Target date: 07/13/15   Attained or Revised? Discontinue - Derek Walker is not invested this at this time.     3 c. Measurable Objectives :  I will meet with the [facility] Specialist for assessment / counseling on readiness for change and a plan for recovery   Date established: 01/11/15   Target date: 01/11/16   Attained or Revised? Revised as follows: Derek Walker will meet with IDDT Specialist at least three times per month to discuss the relationship between Mental Health, Physical Health, and Substance Use.     3 d. Measurable Objectives : I will learn 2-3 new coping skills for coping with anxiety, pain, depression and poor sleep. - practice one skill per week.   Date established: 01/11/15   Target date: 01/11/16   Attained or Revised? Revised as follows: Derek Walker will identify alternatives to drinking alcohol to cope with anxiety, at least once per month.     3 e. Measurable Objectives :I will consistently attend my medical appointments for physical and mental healthcare.   Date established: 01/11/15   Target date: 04/12/15   Attained or Revised? Attained - Derek Walker has demonstrated good attendance to his physical needs.  Will need to continue to be encouraged to meet with ACT frequently, which are addressed in interventions on this plan.     3 f. Measurable Objectives : I will take my medications as prescribed and I will communicate my concerns and questions     Date established: 01/11/15   Target date: 04/12/15   Attained or Revised? Revised as follows: Attained - Derek Walker has demonstrated consistent medication use.     Interventions:    1. ACT RN's or staff will provide basic nutritional counseling and/or refer to an  RD/other nutrition education in community.   2. ACT may access wrap funds as needed.    3. ACT IDDT speciialist will provide assessment and SAS counseling 3x/month  4. ACT therapist/staff will provide psychoeducation and skills training to develop coping skills.   5. ACT staff will provide weekly psychotherapy, including motivational interviewing, CBT, relapse prevention, etc.   6. ACT staff will assist Derek Walker in coordinating care, such as making/keeping appointments, securing transportation, etc. To medical care.   7. ACT staff will collaborate with other providers for continuity of care as needed.   8. ACT RN's/staff will provide weekly or at least monthly medication support and management.   9. ACT Staff will encourage Derek Walker to continue utilizing ACT services at least six times per month, to manage his mental health symptoms while focusing on improve overall physical health.          Plan  TREATMENT MODALITIES:  Individual psychotherapy for 30 - 60 min Q 6 X every 4 weeks with ACT Team.  Psychopharmacology visits 30 - 45 min Q 4 weeks with Physician or nursing staff.     DISCHARGE CRITERIA for this treatment setting: a) Client will be able to be treated at a lower level of care and he will have the necessary natural supports in place in the community. b) Client will be engaged in his ongoing medical treatment.     Clinician's name: Lenice Pressman    Psychiatrist's Name: Marlena Clipper    ZOXWRUEAVW'U Name: Derek Walker    Patient/Family Statement  PATIENT/FAMILY STATEMENT:  Obtain patient and family input into the treatment plan, including areas of agreement / disagreement.  Obtain patient's signature - if not possible, briefly describe the reason.     Patient Comments:          I HAVE PARTICIPATED IN THE DEVELOPMENT OF THIS TREATMENT PLAN AND I AGREE WITH ITS CONTENTS:  Patient Signature: _______________________________________________________    Date: ______________________________

## 2015-07-27 ENCOUNTER — Ambulatory Visit: Payer: Self-pay

## 2015-07-27 NOTE — Progress Notes (Signed)
Behavioral Health Progress Note     LENGTH OF SESSION: 45 minutes    Contact Type:  Location: On Site    Face to Face     Problem(s)/Goals Addressed from Treatment Plan:    Problem 3:    R PSY TP PROBLEM 3 07/26/2015   3RD TREATMENT PLAN PROBLEM poor physical and mental health       Goal for this problem:    R PSY TP GOAL 3 07/26/2015   3RD TREATMENT PLAN GOAL "I will get my health back in order (nutrition, weight, exercise)".       Progress towards this goal: Problem resolving. Comment Client is starting to attend medical appointments    Mental Status Exam:  APPEARANCE: Appears stated age, Well-groomed  ATTITUDE TOWARD INTERVIEWER: Cooperative  MOTOR ACTIVITY: WNL (within normal limits)  EYE CONTACT: Direct  SPEECH: Normal rate and tone  AFFECT: Full Range  MOOD: Normal  THOUGHT PROCESS: Normal  THOUGHT CONTENT: No unusual themes  PERCEPTION: Within normal limits  CURRENT SUICIDAL IDEATION: patient denies  CURRENT HOMICIDAL IDEATION: Patient denies  ORIENTATION: Alert and Oriented X 3.  CONCENTRATION: WNL  MEMORY:   Recent: intact   Remote: intact  COGNITIVE FUNCTION: Average intelligence  JUDGMENT: Impaired -  moderate  IMPULSE CONTROL: Fair  INSIGHT: Fair    Risk Assessment:  ASSESSMENT OF RISK FOR SUICIDAL BEHAVIOR  Changes in risk for suicide from baseline Formulation of Risk and/or previous intake, including newly identified risk, if any: none      Session Content::  Client met with Probation officer in his office today and talked about practical issues. We scheduled his cab for a Medicine In Psychiatry appointment at 08/02/15. We also called [facility] to check on his application for an apartment and learned that client was refused due to bad credit. Client and Probation officer called for a free credit report and one will be mailed to him in fifteen days. After we learn what is causing client's bad credit we will set up a payment plan and obtain another credit report in six months. Client stated he did not know if and when  he would have to move from his current house which he shares with his family but the house is in foreclosure and he may be moving in with his family wherever they move.       Interventions:  Problem Solving Therapy skills, Provided Psychoeducation, Supportive Psychotherapy    Current Treatment Plan   Created/Updated On 07/26/2015   Next Treatment Plan Due 01/11/2016         Plan:  Psychotherapy continues as described in care plan; plan remains the same.    NEXT APPT: 08/01/15      Debby Freiberg

## 2015-08-01 ENCOUNTER — Ambulatory Visit: Payer: Self-pay

## 2015-08-01 NOTE — Progress Notes (Signed)
[facility] MISSED/CANCELLED APPOINTMENT     Name: Sunnie NielsenRALPH D Delpizzo  MRN: [id number]   DOB: [birth year]    Date of Scheduled Service: 08/01/15    Mr. Derk was a no show for today's appointment.  I have left a message with family that I had stopped by to see him today.      Additional Information:    Not applicable

## 2015-08-02 ENCOUNTER — Ambulatory Visit: Payer: Self-pay | Admitting: Psychiatry

## 2015-08-02 ENCOUNTER — Encounter: Payer: Self-pay | Admitting: Psychiatry

## 2015-08-02 VITALS — BP 101/58 | HR 86 | Temp 98.6°F | Wt 161.0 lb

## 2015-08-02 DIAGNOSIS — M542 Cervicalgia: Secondary | ICD-10-CM
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
## 2015-08-03 ENCOUNTER — Ambulatory Visit: Payer: Self-pay

## 2015-08-08 ENCOUNTER — Ambulatory Visit: Payer: Self-pay

## 2015-08-08 NOTE — Progress Notes (Cosign Needed)
[facility] MISSED/CANCELLED APPOINTMENT     Name: Derek NielsenRALPH D Walker  MRN: [id number]   DOB: 07/04/1976    Date of Scheduled Service: 08/08/15    [name] was a no show for today's appointment.  I have advised family to let him know that the ACT Team staff stopped by today.    Additional Information:    Was advised that client was [place] his girlfriend's [address] [place] [address].

## 2015-08-15 ENCOUNTER — Ambulatory Visit: Payer: Self-pay

## 2015-08-16 ENCOUNTER — Ambulatory Visit: Payer: Self-pay

## 2015-08-17 ENCOUNTER — Ambulatory Visit: Payer: Self-pay | Admitting: Psychiatry

## 2015-08-17 ENCOUNTER — Encounter: Payer: Self-pay | Admitting: Psychiatry

## 2015-08-17 ENCOUNTER — Ambulatory Visit: Payer: Self-pay

## 2015-08-17 DIAGNOSIS — F333 Major depressive disorder, recurrent, severe with psychotic symptoms: Secondary | ICD-10-CM

## 2015-08-17 MED ORDER — OLANZAPINE 5 MG PO TABS *I*
5.0000 mg | ORAL_TABLET | Freq: Every day | ORAL | 2 refills | Status: DC
Start: 2015-08-17 — End: 2016-03-29
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
## 2015-08-24 ENCOUNTER — Ambulatory Visit: Payer: Self-pay | Admitting: Psychiatry

## 2015-08-24 ENCOUNTER — Encounter: Payer: Self-pay | Admitting: Psychiatry

## 2015-08-24 DIAGNOSIS — F333 Major depressive disorder, recurrent, severe with psychotic symptoms: Secondary | ICD-10-CM

## 2015-08-24 MED ORDER — PAROXETINE HCL 10 MG PO TABS *I*
10.0000 mg | ORAL_TABLET | Freq: Every morning | ORAL | 0 refills | Status: DC
Start: 2015-08-24 — End: 2015-09-28

## 2015-08-24 MED ORDER — MIRTAZAPINE 7.5 MG PO TABS *A*
ORAL_TABLET | ORAL | 2 refills | Status: DC
Start: 2015-08-24 — End: 2015-09-28
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
## 2015-08-25 ENCOUNTER — Emergency Department
Admission: EM | Admit: 2015-08-25 | Disposition: A | Discharge status: Home / Self Care | Payer: Self-pay | Source: Ambulatory Visit | Attending: Emergency Medicine | Admitting: Emergency Medicine

## 2015-08-25 ENCOUNTER — Encounter: Payer: Self-pay | Admitting: Emergency Medicine

## 2015-08-25 LAB — HM HIV SCREENING OFFERED

## 2015-08-25 MED ORDER — HYDROCODONE-ACETAMINOPHEN 5-325 MG PO TABS *I*
1.0000 | ORAL_TABLET | ORAL | 0 refills | Status: DC | PRN
Start: 2015-08-25 — End: 2015-09-12

## 2015-08-25 MED ORDER — CYCLOBENZAPRINE HCL 10 MG PO TABS *I*
10.0000 mg | ORAL_TABLET | Freq: Once | ORAL | Status: DC
Start: 2015-08-25 — End: 2015-08-25

## 2015-08-25 MED ORDER — IBUPROFEN 400 MG PO TABS *I*
800.0000 mg | ORAL_TABLET | Freq: Once | ORAL | Status: DC
Start: 2015-08-25 — End: 2015-08-25

## 2015-08-25 MED ORDER — DIAZEPAM 2 MG PO TABS *I*
2.0000 mg | ORAL_TABLET | Freq: Every evening | ORAL | 0 refills | Status: DC | PRN
Start: 2015-08-25 — End: 2015-09-12
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
## 2015-08-25 NOTE — Discharge Instructions (Signed)
Review your home care/return instructions.    You may take your home Motrin every 6 hours for the next 3 days (with meals) for inflammation relief. You may take the prescribed  Norco for pain relief. Take the Valium at night for muscle spasm/stiffness.    Narcotic medications can make you sleepy.  Do NOT drive if taking Valium or Norco, or Percocet.  Do not mix his medications with other sleeping medications.  Do NOT mix alcohol/Tylenol with these medications.
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
## 2015-08-29 ENCOUNTER — Ambulatory Visit: Payer: Self-pay

## 2015-08-31 ENCOUNTER — Ambulatory Visit: Payer: Self-pay

## 2015-09-04 NOTE — Progress Notes (Signed)
[facility] MISSED/CANCELLED APPOINTMENT     Name: Derek NielsenRALPH D Kleist  MRN: [id number]   DOB: May 05, 1976    Date of Scheduled Service: 08/31/2015    [name] was a no show for today's appointment.  I have placed this information in the ACT book.     Additional Information:    Not applicable

## 2015-09-05 ENCOUNTER — Ambulatory Visit: Payer: Self-pay | Admitting: Family Medicine

## 2015-09-05 ENCOUNTER — Ambulatory Visit: Payer: Self-pay

## 2015-09-07 ENCOUNTER — Ambulatory Visit: Payer: Self-pay

## 2015-09-12 ENCOUNTER — Encounter: Payer: Self-pay | Admitting: Family Medicine

## 2015-09-12 ENCOUNTER — Ambulatory Visit: Payer: Self-pay | Admitting: Family Medicine

## 2015-09-12 ENCOUNTER — Ambulatory Visit: Payer: Self-pay

## 2015-09-12 VITALS — BP 131/79 | HR 98 | Temp 97.3°F | Wt 165.0 lb

## 2015-09-12 DIAGNOSIS — M436 Torticollis: Secondary | ICD-10-CM | POA: Insufficient documentation

## 2015-09-12 MED ORDER — HYDROCODONE-ACETAMINOPHEN 5-325 MG PO TABS *I*
1.0000 | ORAL_TABLET | Freq: Three times a day (TID) | ORAL | 0 refills | Status: DC | PRN
Start: 2015-09-12 — End: 2016-10-25

## 2015-09-12 MED ORDER — BACLOFEN 10 MG PO TABS *I*
10.0000 mg | ORAL_TABLET | Freq: Two times a day (BID) | ORAL | 1 refills | Status: DC | PRN
Start: 2015-09-12 — End: 2016-10-25

## 2015-09-12 MED ORDER — LIDOCAINE 5 % EX OINT *I*
TOPICAL_OINTMENT | Freq: Three times a day (TID) | CUTANEOUS | 2 refills | Status: DC | PRN
Start: 2015-09-12 — End: 2016-10-25
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
## 2015-09-13 NOTE — Progress Notes (Signed)
Behavioral Health Progress Note     LENGTH OF SESSION: 20 minutes    Contact Type:  Location: On Site    Face to Face     Problem(s)/Goals Addressed from Treatment Plan:    Problem 3:    R PSY TP PROBLEM 3 07/26/2015   3RD TREATMENT PLAN PROBLEM poor physical and mental health       Goal for this problem:    R PSY TP GOAL 3 07/26/2015   3RD TREATMENT PLAN GOAL "I will get my health back in order (nutrition, weight, exercise)".       Progress towards this goal: Problem resolving. Comment Client is starting to attend medical appointments    Mental Status Exam:  APPEARANCE: Appears stated age, Well-groomed  ATTITUDE TOWARD INTERVIEWER: Cooperative  MOTOR ACTIVITY: WNL (within normal limits)  EYE CONTACT: Direct  SPEECH: Normal rate and tone  AFFECT: Full Range  MOOD: Normal  THOUGHT PROCESS: Normal  THOUGHT CONTENT: No unusual themes  PERCEPTION: Within normal limits  CURRENT SUICIDAL IDEATION: patient denies  CURRENT HOMICIDAL IDEATION: Patient denies  ORIENTATION: Alert and Oriented X 3.  CONCENTRATION: WNL  MEMORY:   Recent: intact   Remote: intact  COGNITIVE FUNCTION: Average intelligence  JUDGMENT: Impaired -  moderate  IMPULSE CONTROL: Fair  INSIGHT: Fair    Risk Assessment:  ASSESSMENT OF RISK FOR SUICIDAL BEHAVIOR  Changes in risk for suicide from baseline Formulation of Risk and/or previous intake, including newly identified risk, if any: none      Session Content:: Client and Probation officer met as he was on his way to a medical appointment at [facility]. He appeared healthy and looked like he gained weight. Client said he felt good and has been doing better. He did not mention housing, substance use ar any particular problem or issue. Client confirmed that he is coming to meet with writer on 09/14/15 at 11:00.     Interventions:  Problem Solving Therapy skills, Provided Psychoeducation, Supportive Psychotherapy    Current Treatment Plan   Created/Updated On 07/26/2015   Next Treatment Plan Due 01/11/2016          Plan:  Psychotherapy continues as described in care plan; plan remains the same.    NEXT APPT: 09/14/15      Debby Freiberg

## 2015-09-14 ENCOUNTER — Ambulatory Visit: Payer: Self-pay

## 2015-09-14 NOTE — Progress Notes (Addendum)
Clinical Management Note     Writer received a telephone call today from client stating he could not attend his scheduled appointment because his cab did not show up. Writer telephoned the Medical Answering Service who communicated with [employer]. [employer] reported that they arrived at client house at 9:58AM and waited until 10:10AM calling client two times without reaching him. Writer called client back and informed him of what the Medical Medical Answering Service reported but client denied all of it. Client has a history of of fabricating and possibly malingering. Recently the only time he has come to the [facility] office is to secure statements for the [employer] stating he cannot work. He has missed all other appointments. Client has not signed his treatment plan from 07/13/15. He has been a no show with [name] numerous times. Writer rescheduled client for 09/21/15.

## 2015-09-21 ENCOUNTER — Ambulatory Visit: Payer: Self-pay | Admitting: Family Medicine

## 2015-09-21 ENCOUNTER — Encounter: Payer: Self-pay | Admitting: Family Medicine

## 2015-09-21 ENCOUNTER — Ambulatory Visit: Payer: Self-pay

## 2015-09-21 VITALS — BP 119/74 | HR 96 | Temp 99.3°F | Wt 164.0 lb

## 2015-09-21 DIAGNOSIS — H109 Unspecified conjunctivitis: Secondary | ICD-10-CM | POA: Insufficient documentation

## 2015-09-21 DIAGNOSIS — H1031 Unspecified acute conjunctivitis, right eye: Secondary | ICD-10-CM

## 2015-09-21 MED ORDER — CIPROFLOXACIN HCL 0.3 % OP SOLN *I*
2.0000 [drp] | OPHTHALMIC | 0 refills | Status: AC
Start: 2015-09-21 — End: 2015-09-28

## 2015-09-21 NOTE — Progress Notes (Signed)
Pt arrived C/O Right eye irritation stating he has an eye infection.  Pt scheduled to be seen with Candi CapozziJones DNP at 1130.

## 2015-09-21 NOTE — Progress Notes (Signed)
Subjective:     Patient ID: Derek NielsenRalph D Walker is a 40 y.o. male.    HPI Patient presents for an acute visit for possible pink eye. Patient discussed in huddle with nursing staff, barriers to care include depression, anxiety and PTSD. He relates that itchiness started Saturday morning (12/31), started tearing Monday and Tuesday. He noticed redness in the eye yesterday and eye was stuck shut this am with thick green drainage and swollen eye lids. Today eye is also painful. No sick contacts. Was able to clean off "gunk" but it returned within an hour.     Derek Walker has Transaminitis; Superficial peroneal nerve neuropathy, unspecified laterality; Encounter for counseling for care management of patient with chronic conditions and complex health needs using nurse-based model; HIV disease; Other depression; PTSD (post-traumatic stress disorder); Chronic anxiety; Right foot pain; GERD (gastroesophageal reflux disease); Tinea pedis; Major psychotic depression, recurrent; Neck stiffness; and Conjunctivitis of right eye on his problem list.    Review of Systems   Constitutional: Negative.  Negative for appetite change, fatigue and fever.   HENT: Negative.  Negative for congestion, sinus pressure and sneezing.    Eyes: Positive for pain, discharge, redness and itching. Negative for photophobia and visual disturbance.   Respiratory: Negative.  Negative for cough.    Cardiovascular: Negative.    Gastrointestinal: Negative.  Negative for constipation.   Endocrine: Negative.    Genitourinary: Negative.  Negative for dysuria.   Musculoskeletal: Negative.    Skin: Negative.    Allergic/Immunologic: Negative.    Neurological: Negative.  Negative for dizziness and weakness.   Hematological: Negative.    Psychiatric/Behavioral: Negative.          Objective:   Physical Exam   Constitutional: He is oriented to person, place, and time. He appears well-developed and well-nourished. No distress.   HENT:   Head: Normocephalic.   Eyes: Pupils are  equal, round, and reactive to light. Right eye exhibits discharge and exudate. Left eye exhibits no discharge and no exudate. Right conjunctiva is injected. No scleral icterus.   Green thick exudate from corners of right eye, eye lids swollen and bright red internally.    Cardiovascular: Normal rate and regular rhythm.    Pulmonary/Chest: Effort normal.   Abdominal: Soft.   Musculoskeletal: He exhibits no edema.   Neurological: He is alert and oriented to person, place, and time. Coordination normal.   Skin: Skin is warm and dry. He is not diaphoretic.   Psychiatric: He has a normal mood and affect. His behavior is normal. Judgment and thought content normal.   Vitals reviewed.    BP 119/74  Pulse 96  Temp 37.4 C (99.3 F)  Wt 74.4 kg (164 lb)  SpO2 98%  BMI 22.87 kg/m2       Assessment/Plan:    Patient Active Problem List    Diagnosis Date Noted    Conjunctivitis of right eye 09/21/2015     Priority: High     Symptoms of itchiness started on Saturday 12/31, over next few days eye started to tear and was swollen shut this am. Purulent drainage, injected conjunctiva and swollen eye lids.   - Start Cipro eye drops  - If no improvement is to return to clinic or go to urgent care  - Instructions for eye care given       Hazelynn Mckenny L Delfino Lovettapozzi Chalmers, DNP

## 2015-09-21 NOTE — Patient Instructions (Signed)
Conjunctivitis (Pinkeye)    The Basics   Written by the doctors and editors at [employer]To[employer]   What is pinkeye? -- “Pinkeye” is the everyday term people use to describe an infection or irritation of the eye. The medical term for pinkeye is “conjunctivitis.”  If you have pinkeye, your eye (or eyes) might:  ?Turn red or pink  ?Weep or ooze a gooey liquid  ?Become itchy or burn  ?Get stuck shut   Pinkeye can be caused by an infection, allergies, or an unknown irritation.  Can you catch pinkeye from someone else? -- Yes. When pinkeye is caused by an infection, it can spread easily. Usually, people catch it from touching something that has touched an infected person’s eye.   If you know someone with pinkeye, avoid touching his or her pillowcases, towels, or other personal items.   When should I see my doctor or nurse? -- See your doctor or nurse if your eye is red, oozing, or in pain.   Can pinkeye be treated? -- Most cases of pinkeye go away on their own, without treatment. But, yes, it can be treated.   When pinkeye is caused by infection, it is usually caused by a virus, so antibiotics will NOT help. Still, pinkeye caused by a virus will go away on its own in a few days. Pinkeye caused by an infection with bacteria can be treated with antibiotic eye drops or gels. Pinkeye caused by other problems can be treated with eye drops normally used to treat allergies. These drops will not cure the pinkeye, but they can help with itchiness and irritation.  When using eye drops for infection, do not touch your good eye after touching your affected eye, and do not touch the bottle or dropper directly in one eye and then use it in the other. Both of these things can cause the infection to spread from one eye to the other.  What if I wear contact lenses? -- If you wear contact lenses and you have symptoms of pinkeye, it is really important to have a doctor look at your eyes. In people who wear contacts, the symptoms of pinkeye can  be caused by serious problems.   During treatment for eye infections, you might need to stop wearing your contacts for a short time. Plus, you might need to throw away your contact lens case and carefully clean your contacts. If your contacts are disposable, you will want to throw them away and start fresh.  Can pinkeye be prevented? -- To keep from getting or spreading pinkeye, wash your hands often with soap and water. If washing is not possible, alcohol-based hand gels work, too. Also, avoid sharing towels, bed clothes, or other personal items with a person who has pinkeye.   All topics are updated as new evidence becomes available and our peer review process is complete.  This topic retrieved from [employer] on: Jan 05, 2014.  Topic 15479 Version 8.0  Release: 23.3 - C23.74  © 2015 [employer] All rights reserved.  Consumer Information Use and Disclaimer   This information is not specific medical advice and does not replace information you receive from your health care provider. This is only a brief summary of general information. It does NOT include all information about conditions, illnesses, injuries, tests, procedures, treatments, therapies, discharge instructions or life-style choices that may apply to you. You must talk with your health care provider for complete information about your health and treatment options. This information should not be used to decide whether or not to accept your health care provider's advice,   instructions or recommendations. Only your health care provider has the knowledge and training to provide advice that is right for you.The use of [employer]ToDate content is governed by the [employer] Terms of Use. ©2015 [employer] Inc. All rights reserved.  Copyright   © 2015 [employer] All rights reserved.

## 2015-09-21 NOTE — Progress Notes (Signed)
Behavioral Health Progress Note     LENGTH OF SESSION: 20 minutes    Contact Type:  Location: On Site    Face to Face     Problem(s)/Goals Addressed from Treatment Plan:    Problem 3:    R PSY TP PROBLEM 3 07/26/2015   3RD TREATMENT PLAN PROBLEM poor physical and mental health       Goal for this problem:    R PSY TP GOAL 3 07/26/2015   3RD TREATMENT PLAN GOAL "I will get my health back in order (nutrition, weight, exercise)".       Progress towards this goal: Problem resolving. Comment Client is starting to attend medical appointments    Mental Status Exam:  APPEARANCE: Appears stated age, Well-groomed  ATTITUDE TOWARD INTERVIEWER: Cooperative  MOTOR ACTIVITY: WNL (within normal limits)  EYE CONTACT: Direct  SPEECH: Normal rate and tone  AFFECT: Full Range  MOOD: Normal  THOUGHT PROCESS: Normal  THOUGHT CONTENT: No unusual themes  PERCEPTION: Within normal limits  CURRENT SUICIDAL IDEATION: patient denies  CURRENT HOMICIDAL IDEATION: Patient denies  ORIENTATION: Alert and Oriented X 3.  CONCENTRATION: WNL  MEMORY:   Recent: intact   Remote: intact  COGNITIVE FUNCTION: Average intelligence  JUDGMENT: Impaired -  moderate  IMPULSE CONTROL: Fair  INSIGHT: Fair    Risk Assessment:  ASSESSMENT OF RISK FOR SUICIDAL BEHAVIOR  Changes in risk for suicide from baseline Formulation of Risk and/or previous intake, including newly identified risk, if any: none      Session Content:: Client came to the [facility] office where he signed his 07/13/15 - 01/11/16 Treatment Plan. Client could not complete his Self Assessment Form because it was discovered he had pink eye. He was brought to the [facility] clinic for treatment and to understand precautions. Client brought his self assessment home with him and it will be picked up by writer on 09/26/15.       Interventions:  Problem Solving Therapy skills, Provided Psychoeducation, Supportive Psychotherapy    Current Treatment Plan   Created/Updated On 07/26/2015   Next  Treatment Plan Due 01/11/2016         Plan:  Psychotherapy continues as described in care plan; plan remains the same.    NEXT APPT: 09/26/15      Lenice PressmanLouis Iziah Cates

## 2015-09-26 ENCOUNTER — Ambulatory Visit: Payer: Self-pay

## 2015-09-27 ENCOUNTER — Telehealth: Payer: Self-pay

## 2015-09-27 NOTE — Telephone Encounter (Signed)
Pt called stating he continues to have eye pain, redness and drainage and that the drops given at the last appointment have not worked for him.  Writer offered him an appointment today, pt is unable to come today and will call tomorrow for a same day visit or go to Urgent care.

## 2015-09-27 NOTE — Progress Notes (Signed)
Behavioral Health Progress Note     LENGTH OF SESSION: 20 minutes    Contact Type:  Location: On Site    Face to Face     Problem(s)/Goals Addressed from Treatment Plan:    Problem 3:    R PSY TP PROBLEM 3 07/26/2015   3RD TREATMENT PLAN PROBLEM poor physical and mental health       Goal for this problem:    R PSY TP GOAL 3 07/26/2015   3RD TREATMENT PLAN GOAL "I will get my health back in order (nutrition, weight, exercise)".       Progress towards this goal: Problem resolving. Comment Client is starting to attend medical appointments    Mental Status Exam:  APPEARANCE: Appears stated age, Well-groomed  ATTITUDE TOWARD INTERVIEWER: Cooperative  MOTOR ACTIVITY: WNL (within normal limits)  EYE CONTACT: Direct  SPEECH: Normal rate and tone  AFFECT: Full Range  MOOD: Normal  THOUGHT PROCESS: Normal  THOUGHT CONTENT: No unusual themes  PERCEPTION: Within normal limits  CURRENT SUICIDAL IDEATION: patient denies  CURRENT HOMICIDAL IDEATION: Patient denies  ORIENTATION: Alert and Oriented X 3.  CONCENTRATION: WNL  MEMORY:   Recent: intact   Remote: intact  COGNITIVE FUNCTION: Average intelligence  JUDGMENT: Impaired -  moderate  IMPULSE CONTROL: Fair  INSIGHT: Fair    Risk Assessment:  ASSESSMENT OF RISK FOR SUICIDAL BEHAVIOR  Changes in risk for suicide from baseline Formulation of Risk and/or previous intake, including newly identified risk, if any: none      Session Content:: Client completed his Self-Assessment form coinciding with his most recent treatment plan. Client was polite enough to bring his own pen since he is a carrier for pink eye. He is scheduled to return to the [facility] on 09/28/15 because he feels his current medications are not helping.        Interventions:  Problem Solving Therapy skills, Provided Psychoeducation, Supportive Psychotherapy    Current Treatment Plan   Created/Updated On 07/26/2015   Next Treatment Plan Due 01/11/2016         Plan:  Psychotherapy continues as described  in care plan; plan remains the same.    NEXT APPT: 09/28/15      Lenice PressmanLouis Cai Flott

## 2015-09-28 ENCOUNTER — Ambulatory Visit: Payer: Self-pay | Admitting: Psychiatry

## 2015-09-28 ENCOUNTER — Encounter: Payer: Self-pay | Admitting: Psychiatry

## 2015-09-28 ENCOUNTER — Ambulatory Visit: Payer: Self-pay

## 2015-09-28 ENCOUNTER — Ambulatory Visit
Admission: RE | Admit: 2015-09-28 | Discharge: 2015-10-17 | Disposition: A | Discharge status: Home / Self Care | Payer: Self-pay | Source: Ambulatory Visit | Attending: Psychiatry | Admitting: Psychiatry

## 2015-09-28 DIAGNOSIS — F333 Major depressive disorder, recurrent, severe with psychotic symptoms: Secondary | ICD-10-CM

## 2015-09-28 MED ORDER — MIRTAZAPINE 30 MG PO TABS *I*
30.0000 mg | ORAL_TABLET | Freq: Every evening | ORAL | 2 refills | Status: DC
Start: 2015-09-28 — End: 2016-03-29

## 2015-09-28 NOTE — Progress Notes (Signed)
Behavioral Health Psychopharmacology Follow-up     Length of Session: 20 minutes.    Diagnosis Addressed    ICD-10-CM ICD-9-CM   1. Major psychotic depression, recurrent F33.3 296.34       Chief Complaint: Patient states "I'm hanging in there... It's the new year and I have new hopes..."       There were no vitals taken for this visit.  Wt Readings from Last 3 Encounters:   09/21/15 74.4 kg (164 lb)   09/12/15 74.8 kg (165 lb)   08/25/15 79.4 kg (175 lb)       Recent History and Response to Medications  Patient is here for psychopharmacology follow up. At the previous appointment, patient's paroxetine was discontinued and mirtazapine was started to better target his depression/anxiety as he reported he responded and tolerated well to mirtazapine while in prison. Patient reports he is feeling "more relaxed" since mirtazapine was started, but he continues to have difficulty articulating what depression feels like for him. However, denies any thoughts of self harm or SI/HI/VI. He denies any thoughts of untowards side effects; however, he is sleeping "throughout the night" and he is happy about this. He denies any signs/symptoms of psychosis.     Patient reports he received in the mail from [place] that he needs to do a drug and alcohol evaluation. He states initially he was agitated about this; however, he now states, "I'm just going to go with it..." He has an appointment with [facility] today at 12:30PM.       Current use of alcohol or drugs: no    ENERGY: Good, Fair  SLEEP: Normal.    APPETITE: Good, Fair  WEIGHT: No Change  SEXUAL FUNCTION: not asked  ENJOYMENT/INTEREST: Fair    Review of Systems:  Pertinent items are noted in HPI.    Current Medications  Current Outpatient Prescriptions   Medication Sig    ciprofloxacin (CILOXAN) 0.3 % ophthalmic solution Place 2 drops into both eyes every 4 hours    baclofen (LIORESAL) 10 MG tablet Take 1 tablet (10 mg total) by mouth 2 times daily as needed for Muscle spasms     lidocaine (XYLOCAINE) 5 % ointment Apply topically 3 times daily as needed for Pain (neck stiffness and pain)   Apply to top of right foot and small toes    HYDROcodone-acetaminophen (NORCO) 5-325 MG per tablet Take 1 tablet by mouth every 8 hours as needed for Pain   Max daily dose: 3 tablets    PARoxetine (PAXIL) 10 MG tablet Take 1 tablet (10 mg total) by mouth every morning    mirtazapine (REMERON) 7.5 MG tablet Take 1 tablet by mouth nightly for 7 nights, then take 2 tablets by mouth nightly.    OLANZapine (ZYPREXA) 5 MG tablet Take 1 tablet (5 mg total) by mouth daily   TDD 23m    terbinafine (LAMISIL) 1 % cream Apply topically 2 times daily    omeprazole (PRILOSEC) 20 MG capsule Take 1 capsule (20 mg total) by mouth nightly    efavirenz-emtrictabine-tenofovir (ATRIPLA) 600-200-300 MG per tablet Take 1 tablet by mouth nightly on an empty stomach    gabapentin (NEURONTIN) 300 MG capsule Take 1 capsule (300 mg total) by mouth 3 times daily    prazosin (MINIPRESS) 1 MG capsule Take 1 capsule (1 mg total) by mouth nightly   for nightmares    OLANZapine (ZYPREXA) 15 MG tablet Take 1 tablet (15 mg total) by mouth nightly  nicotine polacrilex (COMMIT) 4 MG lozenge Place 1 lozenge (4 mg total) inside cheek as needed for Smoking cessation   Max daily dose: 20 lozenges No more than 5 pieces in 6hrs.     No current facility-administered medications for this visit.        Side Effects  Sedation and due to his he is sleeping better at night    Mental Status  APPEARANCE: Appears stated age, Casual  ATTITUDE TOWARD INTERVIEWER: Cooperative  MOTOR ACTIVITY: WNL (within normal limits)  EYE CONTACT: Direct  SPEECH: Normal rate and tone  AFFECT: Appropriate  MOOD: Euthymic  THOUGHT PROCESS: Normal  THOUGHT CONTENT: No unusual themes  PERCEPTION: No evidence of hallucinations  ORIENTATION: Alert and Oriented X 3.  CONCENTRATION: Good  MEMORY:   Recent: intact   Remote: intact  COGNITIVE FUNCTION: Average  intelligence  JUDGMENT: Intact  IMPULSE CONTROL: Good  INSIGHT: Good    Risk Assessment  Self Injury: Patient Denies  Suicidal Ideation: Patient Denies  Homicidal Ideation: Patient Denies  Aggressive Behavior: Patient Denies    If any of the answers above are Yes, is there access to lethal means?   No    Results  Lab Results   Component Value Date    CHOL 189 09/01/2012    HDL 90 09/01/2012    LDLC 88 09/01/2012    TRIG 54 09/01/2012    CHHDC 2.1 09/01/2012    GLU 81 05/31/2015    HA1C 5.2 07/27/2012    WBC 5.0 03/03/2015    ASEGR 3.1 03/03/2015     No results for input(s): TSH in the last 8760 hours.        Lab results: 05/31/15  1235   SODIUM 141   POTASSIUM 4.3   CHLORIDE 99   CO2 24   UN 6   CREATININE 0.71   GFR,CAUCASIAN 118   GFR,BLACK 136   GLUCOSE 81   CALCIUM 9.4   TOTAL PROTEIN 7.7   ALBUMIN 4.6   ALT 60*   AST 75*   ALK PHOS 94   BILIRUBIN,TOTAL 0.6         Current Treatment Plan   Created/Updated On 07/26/2015   Next Treatment Plan Due 01/11/2016       Assessment:  Derek Walker is a 40 year old african [place] male with a history of PTSD, depression, anxiety and nicotine dependence presents to [facility] for his scheduled psychopharmacology follow up who is able to sleep through the night since the initiation of mirtazapine (discontinued paroxetine) and feeling "more relaxed" and not as irritable as before. His frequency of the nightmare has also decreased. His mood is even, though he cannot articulate about his depressive symptoms; he is attempting to have an optimistic view of life due to being the new year. However, he cotninues to isolate and stays mainly at home. No evidence of psychosis or depression; denies any thoughts of self harm or SI/Hi/VI. No safety or lethality concerns at this time. He has abstained from drinking alcohol since last Thanksgiving and no illicit drug use.   Patient is responding favorably to mirtazapine; will increase the dose to 30m PO HS to target depression, anxiety, sleep and  to also stimulate appetite as he continues to have fair appetite.     Patient has a drug/alcohol evaluation with [facility] today per [facility].         Labs reviewed with patient:  07/14/15 QTc 442  Last AIMS exam performed on:   Reviewed  and confirmed the PFSH/ROS with the patient.       Plan and Rationale:  1. Increase mirtazapine 66m PO HS for depression/anxiety/sleep  2. Continue with current medication regimen of olanzapine 543mPO and 1559mO HS, mirtazapine 22m108m HS, gabapentin 300mg51mTID for anixiety, prazosin 1mg P69mS for nightmares, and nicotine polacrilex 4mg PO27mN for smoking cessation  3. Continue to provide motivational interviewing related to smoking cessation  4. Continue to abstain from alcohol use  5. Provided informed consent for the above recommended psychiatric medications - following review of common risks/side effects and benefits.  6. Next visit on Tuesday 10/03/15 for medication and symptom management    Selmer aMorrie with above treatment plan as reviewed above.
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
## 2015-09-29 LAB — CHEM DEPEND + ETOH SCRN, URINE
Amphetamine,UR: NEGATIVE
Benzodiazepinen,UR: NEGATIVE
Cocaine/Metab,UR: NEGATIVE
Ethanol,UR: NEGATIVE
Opiates,UR: NEGATIVE
Oxycodone/Oxymorphone,UR: NEGATIVE
THC Metabolite,UR: POSITIVE

## 2015-10-04 ENCOUNTER — Ambulatory Visit: Payer: Self-pay | Admitting: Infectious Diseases

## 2015-10-05 ENCOUNTER — Ambulatory Visit: Payer: Self-pay

## 2015-10-07 ENCOUNTER — Telehealth: Payer: Self-pay

## 2015-10-07 NOTE — Telephone Encounter (Signed)
I told Coolidge that his toxicology screen the day of intake was positive for [facility] and that Dr. Elodia Florence said he has not prescribed Marinol for him since November 2015. Andri denied this. He said that Dr. Elodia Florence gave him five refills and that he filled the last one in November 2016. He was prescribed two tablets per day. He sometimes took four per day and sometimes none at all. He has two tablets remaining. I asked Keevan to bring in his prescription bottle for me to see. He said he is scheduled to see his [facility] therapist on 1/26, so he will stop in to see me afterwards. Maximillian also said that he recently switched his care to [facility] and that the ID clinic is "trying to figure out" whether to prescribe Marinol.

## 2015-10-10 ENCOUNTER — Ambulatory Visit: Payer: Self-pay

## 2015-10-11 ENCOUNTER — Encounter: Payer: Self-pay | Admitting: Infectious Diseases

## 2015-10-11 ENCOUNTER — Ambulatory Visit: Payer: Self-pay | Admitting: Infectious Diseases

## 2015-10-11 VITALS — BP 108/80 | HR 100 | Temp 99.1°F | Resp 16 | Ht 71.0 in | Wt 165.1 lb

## 2015-10-11 DIAGNOSIS — Z0000 Encounter for general adult medical examination without abnormal findings: Secondary | ICD-10-CM

## 2015-10-11 DIAGNOSIS — B20 Human immunodeficiency virus [HIV] disease: Secondary | ICD-10-CM

## 2015-10-11 LAB — COMPREHENSIVE METABOLIC PANEL
ALT: 34 U/L (ref 0–50)
AST: 42 U/L (ref 0–50)
Albumin: 4.4 g/dL (ref 3.5–5.2)
Alk Phos: 95 U/L (ref 40–130)
Anion Gap: 16 (ref 7–16)
Bilirubin,Total: 0.7 mg/dL (ref 0.0–1.2)
CO2: 24 mmol/L (ref 20–28)
Calcium: 9.7 mg/dL (ref 9.0–10.3)
Chloride: 102 mmol/L (ref 96–108)
Creatinine: 0.83 mg/dL (ref 0.67–1.17)
GFR,Black: 127 *
GFR,Caucasian: 110 *
Glucose: 93 mg/dL (ref 60–99)
Lab: 5 mg/dL — ABNORMAL LOW (ref 6–20)
Potassium: 4.4 mmol/L (ref 3.3–5.1)
Sodium: 142 mmol/L (ref 133–145)
Total Protein: 7.5 g/dL (ref 6.3–7.7)

## 2015-10-11 LAB — CBC AND DIFFERENTIAL
Baso # K/uL: 0.1 10*3/uL (ref 0.0–0.1)
Basophil %: 1 %
Eos # K/uL: 0.2 10*3/uL (ref 0.0–0.5)
Eosinophil %: 4.2 %
Hematocrit: 48 % (ref 40–51)
Hemoglobin: 16 g/dL (ref 13.7–17.5)
IMM Granulocytes #: 0 10*3/uL (ref 0.0–0.1)
IMM Granulocytes: 0.4 %
Lymph # K/uL: 1.3 10*3/uL (ref 1.3–3.6)
Lymphocyte %: 25.2 %
MCH: 34 pg/cell — ABNORMAL HIGH (ref 26–32)
MCHC: 34 g/dL (ref 32–37)
MCV: 101 fL — ABNORMAL HIGH (ref 79–92)
Mono # K/uL: 0.7 10*3/uL (ref 0.3–0.8)
Monocyte %: 13.2 %
Neut # K/uL: 2.9 10*3/uL (ref 1.8–5.4)
Nucl RBC # K/uL: 0 10*3/uL (ref 0.0–0.0)
Nucl RBC %: 0 /100 WBC (ref 0.0–0.2)
Platelets: 207 10*3/uL (ref 150–330)
RBC: 4.7 MIL/uL (ref 4.6–6.1)
RDW: 11.9 % (ref 11.6–14.4)
Seg Neut %: 56 %
WBC: 5.2 10*3/uL (ref 4.2–9.1)

## 2015-10-11 LAB — MAGNESIUM: Magnesium: 1.6 mEq/L (ref 1.3–2.1)

## 2015-10-11 LAB — PHOSPHORUS: Phosphorus: 3.2 mg/dL (ref 2.7–4.5)

## 2015-10-11 MED ORDER — EFAVIRENZ-EMTRICITAB-TENOFOVIR 600-200-300 MG PO TABS *I*
1.0000 | ORAL_TABLET | Freq: Every evening | ORAL | 2 refills | Status: DC
Start: 2015-10-11 — End: 2016-05-17

## 2015-10-11 NOTE — Patient Instructions (Signed)
Blood and stool tests today    Continue atripla    Follow-up in 3 months    Verify immunization records from PCP

## 2015-10-11 NOTE — Progress Notes (Addendum)
HIV CLINIC NOTE      Date of Diagnosis:  2013    Last HIV Clinic Visit:  07/05/2015    Reason for Visit : Derek Walker is a 40 y.o. old Male who presents to the Infectious Disease Clinic for routine visit for HIV disease. He was diagnosed in 2013 from heterosexual contact after he had oral thrush. He has been taking Atripla since diagnosis. Previously he was on Bactrim for prophylaxis, but off since 2014.    At his visit in 05/2015, he was complaining of decreased appetite for about 1 year, unintentional weight loss of 30 lbs in the same time frame. No nausea, vomiting, fevers chills, cough. He complained of diarrhea for about 1 year, with watery, non-bloody, non-mucoid stools mostly 3-5 times a day without abdominal discomfort. No recent antibiotics since onset. No prior hospitalizations or travel in the past year. No sick contacts. He denied family history of IBD, no personal history of IBS. Mother was diagnosed with colon cancer in her mid-40's. We requested stool for sampling, but he has not been able to provide a sample.     Today, he says he eats once a day, and is asking for appetite stimulants. He previously had marinol which helped tremendously. Diarrhea is still happening, still watery, 4x a day without associated cramps. He was recently started on Remeron 3 weeks ago.       Medications:     Medication reconciliation completed including asking the patient about OTC medications   and herbal supplements.     Pertinent medications include:   ART: Atripla   Prophylaxis: none, off Bactrim since 2014       Adherence:     Patient reports no missed doses of antiretroviral therapy in the past 4 days or 4 weeks. This is self-reported adherence of 100%, thus there is no need for adherence counseling to be performed. The patient reports no barriers to adherence.      Psychosocial:     Tobacco Use: 10 cigarettes per day x 20 years, no plans on quitting   Alcohol Use: occasional, 1-2 times per week, prefers  beer   Illicit Substances: No recreational substances such as crack cocaine, heroin, marijuana, methamphetamines, ecstasy.   Sexual Activity: not currently, last episode was in 2013 piror to HIV diagnosis, heterosexual contact        Lab Screening:     Hep A Ab: check today 09/2015   Hep B SAb: check today 09/2015   Hep B SAg: check today 09/2015   Hep B Core Ab: check today 09/2015   Hep C Ab: check today 09/2015   Trofile: --   [id number]*01: --   G6PD: --   Toxoplasma IgG: --   HIV Resistance: none in 08/2012       Health Maintenance:     TB screen: neg 05/2015   RPR: neg 02/2015   Lipid Profile: defer to PCP   STD Screen GC/CT: neg 02/2015   PAP smear (anal/cervical): --   Vaccines:   Influenza: given 05/2015   Hepatitis A: --   Hepatitis B: --   PPSV 23: --   PCV 13: --   Tetanus: --      ROS:  CONSTITUTIONAL:  No weight loss, anorexia, fever, chills, sweats, fatigue, skin problems, lymph node enlargement  EYES: No visual changes, blurred vision, or floaters  ENT: No problems with mouth or teeth  RESPIRATORY: No cough, shortness of breath,or sputum production  CV: No chest  pain, leg swelling, PND, orthopnea   GI: No nausea, vomiting, constipation, dysphagia or abdominal pain  GU: No urinary symptoms, genital lesions, hemorrhoids  NEURO/MS:  No muscle pain, weakness, numbness in the extremities, edema.  No headache, memory loss, confusion  PAIN: Denies any pain  PSYCH: Appetite is good, sleeping well.  No mood disturbance such as depression or anxiety.  No PTSD or suicidal ideation      Physical Exam:  Blood pressure 108/80, pulse 100, temperature 37.3 C (99.1 F), temperature source Temporal, resp. rate 16, height 1.803 m ( ), weight 74.9 kg (165 lb 1.6 oz), SpO2 98 %.    GENERAL APPEARANCE: well appearing, in NAD, multiple tattoos   SKIN: Warm and dry, no lesions noted  HEENT: No scleral icterus, oropharynx clear, no thrush, no hairy leukoplakia, no ulcers or lesions, good dentition   NECK:  Supple without significant adenopathy   HEART: RRR without murmurs,  rubs, or gallops  LUNGS: Clear to auscultation  bilaterally  ABDOMEN:  soft, non-tender, non-distended without palpable hepato-splenomegaly  LYMPH NODES: No significant adenopathy palpated in the supraclavicular, inguinal, axillary areas  EXTREMITIES: Without clubbing, cyanosis, or edema  NEUROLOGIC: Alert, oriented,  speech is fluent; normal gait and muscle strength      Results:  CD4 -   Lab Results   Component Value Date    CD4A 181 (L) 05/31/2015    CD4A 186 (L) 03/03/2015    CD4A 203 (L) 07/01/2014     Viral load -   Lab Results   Component Value Date    HIV NEG 05/31/2015    HIV NEG 03/03/2015    HIV NEG 07/01/2014         Assessment/Plan :    1. HIV: Here for routine HIV disease   ART: continue Atripla   Prophylaxis: none, no need for Bactrim as his CD 4 percentage is at a good level    Control: CD 4- 181/18% ; VL- neg in 05/2015   Medication compliance emphasized   Labs: recheck CBC, CMP, VL, Hepatitis A IgG, Hepatitis B/C now   Next visit: T cell subset, UA, VL, CMP    2. HEALTHY LIFESTYLE COUNSELING:   Tobacco, alcohol and substance use discussed, unwilling to quit at this time   Depression screen: follows with psychiatry clinic, no SI/HI   He will verify with PCP what vaccinations he recently received, he is due to receive Prevnar if this has not been done    3. DIARRHEA  -watery diarrhea is persisting, although weight has increased 10 lbs since 05/2015  -differentials include anorexia and weight loss secondary to undertreated depression vs infectious diarrhea (parasitic vs bacterial), less likely secondary to advanced AIDS as last CD 4 count was almost 200, other etiologies could be IBS vs IBD  -check stool C. Difficile, bacterial and parasitic PCR, stool culture- stool container and hat given to patient today    4. ANXIETY/DEPRESSION  -managed by psychiatry clinic, currently on Remeron, olanzapine, prazosin    5. Slightly  elevated transaminases  -recheck today, check Hepatitis panel    Return to clinic: 3 months  HIV medications refilled today for 3 months    Patient was seen and discussed with Dr. Karlyne Greenspan M.D.  Infectious Diseases Fellow  [id number]  10/11/2015 6:57 PM  ID Attending MD,I interviewed and evaluated patient and discussed case with ID Fellow and I agree with the assessment and plan.

## 2015-10-12 ENCOUNTER — Ambulatory Visit: Payer: Self-pay

## 2015-10-12 LAB — HEPATITIS A,B,C,PANEL
HBV Core Ab: NEGATIVE
HBV S Ab Quant: 0.25 m[IU]/mL
HBV S Ab: NEGATIVE
HBV S Ag: NEGATIVE
Hep C Ab: NEGATIVE
Hepatitis A IGG: NEGATIVE

## 2015-10-12 LAB — HIV VIRAL LOAD
HIV-1 PCR log: NEGATIVE copies/mL
HIV-1 PCR: NEGATIVE copies/mL

## 2015-10-12 NOTE — Progress Notes (Signed)
Please let him know labs are fine.

## 2015-10-13 ENCOUNTER — Ambulatory Visit: Payer: Self-pay | Admitting: Family Medicine

## 2015-10-13 ENCOUNTER — Encounter: Payer: Self-pay | Admitting: Family Medicine

## 2015-10-16 ENCOUNTER — Telehealth: Payer: Self-pay

## 2015-10-16 NOTE — BH Intake Assessment (Signed)
Level of Care Determination      CLIENT INFORMATION  Client Name: Derek Walker  Social Security #: ### ## 0000  Medicaid number if applicable: n/a  Date: 10/16/2015  ORGANIZATION INFORMATION  Organization Id: [id number]  Organization Service Type: 79  Clinician Name: Crawford Givens  Clinician Phone: [phone]  Clinician Email: [email or web address]  Clinician Fax: [phone]    PRELIMINARY ASSESSMENT  Client Substance with most potential to cause harm:  Cannabis    DIAGNOSIS  The client meets 4 criteria for a moderate substance use disorder  The client meets criteria for tolerance and/or withdrawal.    CRISIS DETOX  Does the person have serious psychiatric or medical symptoms that would require 24-hour  inpatient medical management in a hospital setting where a physician is in attendance daily? No    ADDITIONAL CLINICAL INFORMATION TO CONSIDER IN TREATMENT PLANNING  Not applicable.    RISK FACTORS  The following are client risk factors endorsed during the interview.  Not applicable    RESOURCES  The following are available resources endorsed during the interview.  Performing responsibilities in their work, social or family roles.  Strong self-efficacy or confidence that s/he can pursue recovery goals.  Connection to a social or family network.  Therapeutic alliance with at least one professional helper.  Person can manage triggers for substance abuse in their environment.  Person has stable access to food and shelter.     LEVEL OF CARE RECOMMENDED BY [facility]  Outpatient Services  [facility]-certified outpatient services have multi-disciplinary teams that include medical staff and a  Wellsite geologist. These programs provide the following procedures: group and individual counseling;  education about, orientation to, and opportunity for participation in, relevant and available self-help  groups; alcohol and substance abuse disease awareness and relapse prevention; HIV and other  communicable disease education, risk  assessment, supportive counseling and referral; and family  treatment. In addition, social and health care services, skill development in accessing community  services, activity therapies, information and education about nutritional requirements, and vocational  and educational evaluation must be available either directly or through written agreements. Procedures  are provided according to an individualized assessment and treatment plan.

## 2015-10-16 NOTE — Telephone Encounter (Signed)
-----   Message from Tilden Fossa, MD sent at 10/12/2015  2:46 PM EST -----  Please let him know labs are fine.

## 2015-10-16 NOTE — Telephone Encounter (Signed)
Left message with mother relaying Dr Bonne Dolores message.

## 2015-10-16 NOTE — Telephone Encounter (Signed)
I left a message for Doyal to call me.

## 2015-10-17 ENCOUNTER — Telehealth: Payer: Self-pay

## 2015-10-17 NOTE — Telephone Encounter (Signed)
Derek Walker called to follow up on his intake. I told him of my recommendation for outpatient treatment, since he tested positive for THC, and there is no documentation of a prescription for Marinol. He said that he did not come in last Thursday to show me his prescription bottle because of transportation issues. He continues to claim that he was given four refills, although he is unclear about when it was last prescribed. He thinks it might have been in August. He said that he last filled it in November 2016. This is contrary to information I received from Dr. Elodia Florence. [name] will bring in the bottle on 2/2 when he has his next appointment at [facility]. He said that if treatment is recommended, he will not attend, because he does not have a substance abuse problem.

## 2015-10-18 ENCOUNTER — Ambulatory Visit
Admission: RE | Admit: 2015-10-18 | Discharge: 2015-10-20 | Disposition: A | Discharge status: Home / Self Care | Payer: Self-pay | Source: Ambulatory Visit | Attending: Psychiatry | Admitting: Psychiatry

## 2015-10-19 ENCOUNTER — Ambulatory Visit: Payer: Self-pay

## 2015-10-19 NOTE — Progress Notes (Signed)
[facility] MISSED/CANCELLED APPOINTMENT     Name: FRENCH KENDRA  MRN: [id number]   DOB: June 13, 1976    Date of Scheduled Service: 10/19/2015    Mr. Burdick was a no show for today's appointment.  I have placed this information in the [facility] log book.     Additional Information:    Not applicable

## 2015-10-20 DIAGNOSIS — F4310 Post-traumatic stress disorder, unspecified: Secondary | ICD-10-CM

## 2015-10-20 DIAGNOSIS — B20 Human immunodeficiency virus [HIV] disease: Secondary | ICD-10-CM

## 2015-10-20 DIAGNOSIS — F1220 Cannabis dependence, uncomplicated: Secondary | ICD-10-CM

## 2015-10-20 DIAGNOSIS — F1010 Alcohol abuse, uncomplicated: Secondary | ICD-10-CM

## 2015-10-20 DIAGNOSIS — F329 Major depressive disorder, single episode, unspecified: Secondary | ICD-10-CM

## 2015-10-20 NOTE — BH Discharge Summary (Signed)
[facility] Clinical Management Note     Notes  DISCHARGE NOTE    Intake Date: 09/28/15    Referral Source: [facility]    DSM-V Diagnosis:    ICD-10-CM ICD-9-CM    1. Cannabis use disorder, moderate, dependence F12.20 304.30    2. Alcohol use disorder, mild, abuse F10.10 305.00    3. PTSD (post-traumatic stress disorder) F43.10 309.81    4. Major depression F32.9 296.20    5. HIV (human immunodeficiency virus infection) Z21 V08        Recommendation: Due to Effie's apparent active use of marijuana, he was recommended to attend the outpatient Co-Occurring Disorders track in [facility]. He refused the recommendation, repeatedly claiming to be prescribed Marinol by Dr. Lajuana Carry. Dr. Elodia Florence indicated that he has not prescribed Marinol for him since November 2015. Lajarvis's mental health therapist also denied knowledge of a prescription for Marinol, and there was no indication in his [facility] record or [facility] of such a prescription.    Disposition: Gaurav insisted that he last filled a Marinol prescription in November 2016. He agreed to bring the prescription bottle to show this writer on 10/12/15. However, he did not show or call. A telephone message was left for him on 1/30, and he returned the call on 1/31, stating that he did not have transportation on 1/26 to come to [facility]. He stated that he would not attend treatment because he did not see himself as having a substance use disorder. He agreed to bring the prescription bottle on 2/2 after his appointment at [facility] but did not show or call.     CASE CLOSED in [facility]

## 2015-10-24 ENCOUNTER — Ambulatory Visit: Payer: Self-pay

## 2015-10-24 DIAGNOSIS — F329 Major depressive disorder, single episode, unspecified: Secondary | ICD-10-CM

## 2015-10-24 DIAGNOSIS — F1220 Cannabis dependence, uncomplicated: Secondary | ICD-10-CM

## 2015-10-25 NOTE — Progress Notes (Signed)
Behavioral Health Progress Note     LENGTH OF SESSION: 20 minutes    Contact Type:  Location: On Site    Face to Face     Problem(s)/Goals Addressed from Treatment Plan:    Problem 3:    R PSY TP PROBLEM 3 07/26/2015   3RD TREATMENT PLAN PROBLEM poor physical and mental health       Goal for this problem:    R PSY TP GOAL 3 07/26/2015   3RD TREATMENT PLAN GOAL "I will get my health back in order (nutrition, weight, exercise)".       Progress towards this goal: Problem resolving. Comment Client is starting to attend medical appointments    Mental Status Exam:  APPEARANCE: Appears stated age, Well-groomed  ATTITUDE TOWARD INTERVIEWER: Cooperative  MOTOR ACTIVITY: WNL (within normal limits)  EYE CONTACT: Direct  SPEECH: Normal rate and tone  AFFECT: Full Range  MOOD: Normal  THOUGHT PROCESS: Normal  THOUGHT CONTENT: No unusual themes  PERCEPTION: Within normal limits  CURRENT SUICIDAL IDEATION: patient denies  CURRENT HOMICIDAL IDEATION: Patient denies  ORIENTATION: Alert and Oriented X 3.  CONCENTRATION: WNL  MEMORY:   Recent: intact   Remote: intact  COGNITIVE FUNCTION: Average intelligence  JUDGMENT: Impaired -  moderate  IMPULSE CONTROL: Fair  INSIGHT: Fair    Risk Assessment:  ASSESSMENT OF RISK FOR SUICIDAL BEHAVIOR  Changes in risk for suicide from baseline Formulation of Risk and/or previous intake, including newly identified risk, if any: none      Session Content:: Client and Clinical research associate discussed his recent cab issues which Clinical research associate is trying to understand. Client has claimed several times that his cab did not pick him up for his appointments and when writer telephoned [facility] they checked with client's cab company which has been [employer]. [employer] stated that cabs were sent to client's home and he did not show or answer their phone calls. Writer communicated this information to client but he denied any responsibility or receiving telephone messages. Writer will continue to investigate and coordinate  between client and cab company.       Interventions:  Problem Solving Therapy skills, Provided Psychoeducation, Supportive Psychotherapy    Current Treatment Plan   Created/Updated On 07/26/2015   Next Treatment Plan Due 01/11/2016         Plan:  Psychotherapy continues as described in care plan; plan remains the same.    NEXT APPT: 10/12/15      Lenice Pressman

## 2015-10-25 NOTE — Progress Notes (Signed)
Behavioral Health Progress Note     LENGTH OF SESSION: 15 minutes    Contact Type:  Location: Off Site    Face to Face     Problem(s)/Goals Addressed from Treatment Plan:    Problem 3:    R PSY TP PROBLEM 3 07/26/2015   3RD TREATMENT PLAN PROBLEM poor physical and mental health       Goal for this problem:    R PSY TP GOAL 3 07/26/2015   3RD TREATMENT PLAN GOAL "I will get my health back in order (nutrition, weight, exercise)".       Progress towards this goal: Client reporting that he is doing okay and desires to follow through with his medical recommendations.    Mental Status Exam:  APPEARANCE: Casual  ATTITUDE TOWARD INTERVIEWER: Cooperative  MOTOR ACTIVITY: WNL (within normal limits)  EYE CONTACT: Direct  SPEECH: Normal rate and tone  AFFECT: Full Range  MOOD: Normal  THOUGHT PROCESS: Normal  THOUGHT CONTENT: No unusual themes  PERCEPTION: No evidence of hallucinations  CURRENT SUICIDAL IDEATION: patient denies  CURRENT HOMICIDAL IDEATION: Patient denies  ORIENTATION: Alert and Oriented X 3.  CONCENTRATION: WNL  MEMORY:   Recent: intact   Remote: intact  COGNITIVE FUNCTION: Average intelligence  JUDGMENT: Intact  IMPULSE CONTROL: Poor  INSIGHT: Fair    Risk Assessment:  ASSESSMENT OF RISK FOR SUICIDAL BEHAVIOR  Changes in risk for suicide from baseline Formulation of Risk and/or previous intake, including newly identified risk, if any: none    Session Content::  Writer arrived at client's residence where he was sitting on the porch smoking marijuana.  He reports that he is doing ok, pretty much staying home and not going well.  He notes that he desires to follow through with treatment recommendations that he prefers discussing more in detail with Truddie Hidden on his scheduled visit on 2/9.  He inquired as to wether a cab was scheduled for his 2/9 office visit and writer called transportation and learned that the letter that was on file from the provider had expired in 4/16 and a new form was needed in order to resume  transportation.   Client was advised that writer would communicate the concern to [place] who would follow up with him regarding the concern.  Client noted no further concerns and reports no safety issues at this time.    Visit Diagnosis:      ICD-10-CM ICD-9-CM   1. Major depression F32.9 296.20   2. Cannabis use disorder, moderate, dependence F12.20 304.30       Interventions:  Problem Solving Therapy skills   Wellness check    Current Treatment Plan   Created/Updated On 07/26/2015   Next Treatment Plan Due 01/11/2016         Plan:  Psychotherapy continues as described in care plan; plan remains the same.    NEXT APPT:  10/26/15    Lowella Dandy, LCSW

## 2015-10-26 ENCOUNTER — Ambulatory Visit: Payer: Self-pay

## 2015-10-27 NOTE — Progress Notes (Signed)
Clinical Management Note     Client was a no show today no fault of his own. His cab was cancelled due to his cab order needing to be renewed. Writer is working with [facility] to restore his cab. His 11/02/15 appointment with writer has been approved as an exception.

## 2015-11-02 ENCOUNTER — Ambulatory Visit: Payer: Self-pay

## 2015-11-06 NOTE — Progress Notes (Signed)
Behavioral Health Progress Note     LENGTH OF SESSION: 20 minutes    Contact Type:  Location: On Site    Face to Face     Problem(s)/Goals Addressed from Treatment Plan:    Problem 3:    R PSY TP PROBLEM 3 07/26/2015   3RD TREATMENT PLAN PROBLEM poor physical and mental health       Goal for this problem:    R PSY TP GOAL 3 07/26/2015   3RD TREATMENT PLAN GOAL "I will get my health back in order (nutrition, weight, exercise)".       Progress towards this goal: Problem resolving. Comment Client is starting to attend medical appointments    Mental Status Exam:  APPEARANCE: Appears stated age, Well-groomed  ATTITUDE TOWARD INTERVIEWER: Cooperative  MOTOR ACTIVITY: WNL (within normal limits)  EYE CONTACT: Direct  SPEECH: Normal rate and tone  AFFECT: Full Range  MOOD: Normal  THOUGHT PROCESS: Normal  THOUGHT CONTENT: No unusual themes  PERCEPTION: Within normal limits  CURRENT SUICIDAL IDEATION: patient denies  CURRENT HOMICIDAL IDEATION: Patient denies  ORIENTATION: Alert and Oriented X 3.  CONCENTRATION: WNL  MEMORY:   Recent: intact   Remote: intact  COGNITIVE FUNCTION: Average intelligence  JUDGMENT: Impaired -  moderate  IMPULSE CONTROL: Fair  INSIGHT: Fair    Risk Assessment:  ASSESSMENT OF RISK FOR SUICIDAL BEHAVIOR  Changes in risk for suicide from baseline Formulation of Risk and/or previous intake, including newly identified risk, if any: none      Session Content:: Client came to his appointment today. We discussed his cab issues. Client needs a physicians's statement stating he is unable to take public transportation. The [facility] has asked why he cannot take public transportation and writer explained to client that his diagnosis was not the type of diagnosis that typically warrants a cab service. Client asked Clinical research associate if Clinical research associate would try to get him cab service and Clinical research associate said it was up to Dr. Melchor Amour but Clinical research associate also explained that [facility] could refuse the request even with a physician's  signature.       Interventions:  Problem Solving Therapy skills, Provided Psychoeducation, Supportive Psychotherapy    Current Treatment Plan   Created/Updated On 07/26/2015   Next Treatment Plan Due 01/11/2016         Plan:  Psychotherapy continues as described in care plan; plan remains the same.    NEXT APPT: 11/09/15      Lenice Pressman

## 2015-11-07 ENCOUNTER — Encounter: Payer: Self-pay | Admitting: Psychiatry

## 2015-11-07 ENCOUNTER — Ambulatory Visit: Payer: Self-pay | Admitting: Psychiatry

## 2015-11-07 NOTE — Progress Notes (Signed)
I attempted to visit Mr. Franqui today but when I called on the telephone his uncle answered and told me that Mr. Umbarger was not home.  I left my name and telephone number for Mr. Komar to call me.  His uncle told me that he would deliver the message.  F/U per [facility].

## 2015-11-09 ENCOUNTER — Ambulatory Visit: Payer: Self-pay | Admitting: Family Medicine

## 2015-11-09 ENCOUNTER — Ambulatory Visit: Payer: Self-pay

## 2015-11-10 NOTE — Progress Notes (Signed)
PCP changed in system

## 2015-11-10 NOTE — Progress Notes (Signed)
Clinical Management Note     Client did not show or call to cancel today and his cab was ordered.

## 2015-11-14 NOTE — Progress Notes (Signed)
Note erroneously opened.

## 2015-11-16 ENCOUNTER — Ambulatory Visit: Payer: Self-pay

## 2015-11-16 NOTE — Progress Notes (Signed)
Clinical Management Note     Client did not show despite writer's efforts to secure a cab for him and to renew his cab services for another six months. Client did not call and give an explanation.

## 2015-11-28 ENCOUNTER — Ambulatory Visit: Payer: Self-pay

## 2015-11-28 NOTE — Progress Notes (Signed)
[facility] MISSED/CANCELLED APPOINTMENT     Name: Derek NielsenRALPH Derek Walker  Derek Walker: [id number]   DOB: [birth year]    Date of Scheduled Service: 11/28/15    [name] was a no show for today's appointment.  I have attempted a home visit and phone call to client without success.  Writer left a message with his family to advise him that [facility] staff had stopped by to see him this day.    Additional Information:    Not applicable

## 2015-11-30 ENCOUNTER — Ambulatory Visit: Payer: Self-pay

## 2015-12-06 DIAGNOSIS — F4310 Post-traumatic stress disorder, unspecified: Secondary | ICD-10-CM

## 2015-12-06 DIAGNOSIS — F1220 Cannabis dependence, uncomplicated: Secondary | ICD-10-CM

## 2015-12-06 DIAGNOSIS — F329 Major depressive disorder, single episode, unspecified: Secondary | ICD-10-CM

## 2015-12-06 DIAGNOSIS — F1010 Alcohol abuse, uncomplicated: Secondary | ICD-10-CM

## 2015-12-07 ENCOUNTER — Encounter: Payer: Self-pay | Admitting: Psychiatry

## 2015-12-07 ENCOUNTER — Ambulatory Visit: Payer: Self-pay | Admitting: Psychiatry

## 2015-12-07 NOTE — Progress Notes (Signed)
[facility] MISSED/CANCELLED APPOINTMENT     Name: Derek NielsenRALPH D Picker  MRN: [id number]   DOB: [birth year]    Date of Scheduled Service: 12/07/2015     Writer was informed by an [facility] team member that [name] will not be able to come into [facility] as the medical transportation service has been suspended due to excessive absences.   Writer called the patient [phone]) in the morning and it was decided that we would meet at [facility] [address] at [employer].    Writer arrived to [facility] and waited for the patient, but no showed. Called the patient's home at 11:19AM [phone]) and spoke to the mother who reported that the patient has left the house about 15 minutes ago. Writer waited until 11:45AM, but patient still did not show.     Writer called the patient in the afternoon. Writer apologized and explained to the patient that I waited for him until 11:45AM for the 11AM meeting. Patient reported he arrived to [facility] around 11:45AM. He explained that [facility] is about 8 blocks from his house and he was not sure how long it was going to take him to get there and writer should have waited for him longer. Patient was not upset or angry, but able to address his frustration. Writer explained that I was inside and outside of [facility] several times looking for the patient and also describe the librarians who were there. Patient reported, "I go there all the time, they know me well..." Again, [name] explained and apologized that the appointment time was set for 11AM and encouraged to leave the house a little earlier so that he could be on time for his appointment. Patient hang up the phone. Will not attempt another home visit today.   I have discussed today's appointment with my team.       Additional Information:    Not applicable

## 2015-12-11 NOTE — Progress Notes (Addendum)
[facility] Psychosocial Update     Psychosocial Update (for the preceding 6 months)  12/11/2015    Derek Walker is individually seen two times per week typically on Tuesdays and Thursdays for 30 - 60 minutes.      Type of visit: psychopharm, nursing/health education, medication support, care coordination, mental health counseling targeting person centered goals and objectives     Focus: Stability in the community, recovery oriented activities, medical care, IDDT consult      Current Medications  Current Outpatient Prescriptions   Medication Sig    efavirenz-emtrictabine-tenofovir (ATRIPLA) 600-200-300 MG per tablet Take 1 tablet by mouth nightly on an empty stomach    mirtazapine (REMERON) 30 MG tablet Take 1 tablet (30 mg total) by mouth nightly    baclofen (LIORESAL) 10 MG tablet Take 1 tablet (10 mg total) by mouth 2 times daily as needed for Muscle spasms    lidocaine (XYLOCAINE) 5 % ointment Apply topically 3 times daily as needed for Pain (neck stiffness and pain)   Apply to top of right foot and small toes    HYDROcodone-acetaminophen (NORCO) 5-325 MG per tablet Take 1 tablet by mouth every 8 hours as needed for Pain   Max daily dose: 3 tablets    OLANZapine (ZYPREXA) 5 MG tablet Take 1 tablet (5 mg total) by mouth daily   TDD     terbinafine (LAMISIL) 1 % cream Apply topically 2 times daily    omeprazole (PRILOSEC) 20 MG capsule Take 1 capsule (20 mg total) by mouth nightly    gabapentin (NEURONTIN) 300 MG capsule Take 1 capsule (300 mg total) by mouth 3 times daily    prazosin (MINIPRESS) 1 MG capsule Take 1 capsule (1 mg total) by mouth nightly   for nightmares    OLANZapine (ZYPREXA) 15 MG tablet Take 1 tablet (15 mg total) by mouth nightly    nicotine polacrilex (COMMIT) 4 MG lozenge Place 1 lozenge (4 mg total) inside cheek as needed for Smoking cessation   Max daily dose: 20 lozenges No more than 5 pieces in 6hrs.     No current facility-administered  medications for this visit.        Psychosocial History and Update  Medical History and Pain Update: Derek Walker has no current pain complaints although he does not often have problems with viruses and colds. He is currently treated at [facility] where he has been attending most of his appointments.     Living Arrangements: Derek Walker lives with his mother and other members of his family. He has his own bedroom where he spends most of his time. Derek Walker does not like to be out in public and he dislikes social contacts.     Activities/Programs: ACT and visiting family members only on the holidays.     Current symptoms/impairments: Derek Walker is anxious in social situations and he does not like to be out in public. He describes what may be PTSD symptoms and they impact his ability to use public transportation. He has been observed drunk on occasion by the ACT team.     Substance Use:    Current use of alcohol or drugs: Yes    Past use of alcohol or drugs (note substances, approximate dates): Yes    Drug treatment history:   Approximate Dates:  2016-2017   Treatment and Facility:  [facility]   Additional Comments: N/A    Mental Status Exam  APPEARANCE: Appears younger than stated age, Casual  ATTITUDE TOWARD INTERVIEWER: Cooperative  MOTOR ACTIVITY: WNL (within normal limits)  EYE CONTACT: Direct  SPEECH: Normal rate and tone  AFFECT: Full Range  MOOD: Normal  THOUGHT PROCESS: Normal  THOUGHT CONTENT: No unusual themes  PERCEPTION: Within normal limits  CURRENT SUICIDAL IDEATION: patient denies  CURRENT HOMICIDAL IDEATION: Patient denies  ORIENTATION: Alert and Oriented X 3.  CONCENTRATION: WNL  MEMORY:   Recent: intact   Remote: intact  COGNITIVE FUNCTION: Average intelligence  JUDGMENT: Impaired -  moderate  IMPULSE CONTROL: Fair  INSIGHT: Fair    Diagnosis    ICD-10-CM ICD-9-CM   1. Major depression F32.9 296.20   2. Cannabis use disorder, moderate, dependence F12.20 304.30   3. Alcohol use disorder, mild, abuse  F10.10 305.00   4. PTSD (post-traumatic stress disorder) F43.10 309.81       Without this treatment, is the patient likely to deteriorate, relapse, or require hospitalization? yes    Assessment  (progress towards goals and objectives)    Gains/Advances: Derek Walker has started to slowly trust the ACT team and he is beginning to use his time with us in appropriate and therapeutic ways.     Declines: None    Plan  As per Comprehensive Treatment Plan

## 2015-12-12 ENCOUNTER — Ambulatory Visit: Payer: Self-pay

## 2015-12-12 NOTE — Progress Notes (Signed)
[facility] MISSED/CANCELLED APPOINTMENT     Name: Derek NielsenRALPH D Walker  MRN: [id number]   DOB: 30-Nov-1975    Date of Scheduled Service: 12/12/15    [name] was a no show for today's appointment.  I have attempted a home visit and family advised that [name] was not home.    Additional Information:    Not applicable

## 2015-12-13 ENCOUNTER — Encounter: Payer: Self-pay | Admitting: Radiology
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
## 2015-12-14 ENCOUNTER — Ambulatory Visit: Payer: Self-pay

## 2015-12-15 NOTE — Progress Notes (Signed)
Clinical Management Note     Client did not show for his scheduled weekly appointment and he did not telephone to cancel.

## 2015-12-20 NOTE — Progress Notes (Signed)
Clinical Management Note     Writer mailed a letter to client explaining the ACT team's concerns and offering client assistance. Client has had a consistent no show no call pattern for months and ACT was recently notified that his Medicaid was cancelled. This appears to be due to client's lack of cooperation with the Department of Social Services. DHS mandated client to a substance use intake appointment for evaluation. Client showed for one appointment but when [facility] he reported that he was prescribed Marinol for appetite loss related to his HIV status. When [facility] attempted to confirm client's claim it turned out that his prescription was not documemnted in [facility] although he was prescribed Marinol several years ago. Client did not show for his [facility] follow up appointment and consequently his Medicaid and cab service were both [facility]. Client has not shown for any of his ACT appointments for approximately two months and his attendance was irregular prior to two months ago.

## 2015-12-21 ENCOUNTER — Ambulatory Visit: Payer: Self-pay

## 2015-12-21 DIAGNOSIS — F1010 Alcohol abuse, uncomplicated: Secondary | ICD-10-CM

## 2015-12-21 DIAGNOSIS — F329 Major depressive disorder, single episode, unspecified: Secondary | ICD-10-CM

## 2015-12-25 NOTE — Progress Notes (Signed)
Behavioral Health Progress Note     LENGTH OF SESSION: 45 minutes    Contact Type:  Location: Off Site    Face to Face     Problem(s)/Goals Addressed from Treatment Plan:    Problem 1:   R PSY TP PROBLEM 07/26/2015   1ST TREATMENT PLAN PROBLEM unstable housing       Goal for this problem:    R PSY TP GOAL 07/26/2015   1ST TREATMENT PLAN GOAL "I will get my own apartment asap".         Progress towards this goal: Problem worsening.  Comment: Client has lost his Medici[employer] coverage    Mental Status Exam:  APPEARANCE: Appears stated age, Casual  ATTITUDE TOWARD INTERVIEWER: Cooperative  MOTOR ACTIVITY: WNL (within normal limits)  EYE CONTACT: Direct  SPEECH: Normal rate and tone and Slurred  AFFECT: Happy  MOOD: Lively  THOUGHT PROCESS: Normal  THOUGHT CONTENT: No unusual themes  PERCEPTION: Within normal limits  CURRENT SUICIDAL IDEATION: patient denies  CURRENT HOMICIDAL IDEATION: Patient denies  ORIENTATION: Alert and Oriented X 3.  CONCENTRATION: WNL  MEMORY:   Recent: intact   Remote: intact  COGNITIVE FUNCTION: Average intelligence  JUDGMENT: Impaired -  moderate  IMPULSE CONTROL: Not Intact  INSIGHT: Poor    Risk Assessment:  ASSESSMENT OF RISK FOR SUICIDAL BEHAVIOR  Changes in risk for suicide from baseline Formulation of Risk and/or previous intake, including newly identified risk, if any: none    Session Content::  Probation officer met client at the [employer] where he was approximately thirty minutes late for our appointment. Client was told by writer that his Medicaid was turned off apparently because he did not complete his evaluation and follow the recommendations of [facility] which he was mandated to attend as part of his Department of Estate agent. Client insisted he still did have Medicaid and writer transported him back to his apartment to Theatre manager papers which stated he still had coverage. The information related to client's conditional coverage while his appeal was pending. Client  also told Probation officer that his father died recently and he attended his funeral out of state.Writer telephoned client and explained to him that his medical insurance has been stopped and he needs to speak with the [place] to find out how to proceed to secure coverage again. Client has denied writer's offer to help him.     Visit Diagnosis:      ICD-10-CM ICD-9-CM   1. Major depression F32.9 296.20   2. Alcohol abuse F10.10 305.00       Interventions:  DHS paperwork    Current Treatment Plan   Created/Updated On 07/26/2015   Next Treatment Plan Due 01/11/2016         Plan:  Psychotherapy continues as described in care plan; plan remains the same.    NEXT APPT: 12/28/15      Debby Freiberg

## 2015-12-26 ENCOUNTER — Ambulatory Visit: Payer: Self-pay

## 2015-12-26 NOTE — Progress Notes (Signed)
Clinical Management Note     Writer attempted to make contact with client in order to see him today as scheduled but he did not answer his phone and he did not voicemail available.

## 2015-12-28 ENCOUNTER — Ambulatory Visit: Payer: Self-pay

## 2016-01-01 NOTE — Progress Notes (Signed)
Clinical Management Note     Client was unavailable to meet for our scheduled appointment today.

## 2016-01-02 ENCOUNTER — Ambulatory Visit: Payer: Self-pay | Admitting: Psychiatry

## 2016-01-02 ENCOUNTER — Encounter: Payer: Self-pay | Admitting: Psychiatry

## 2016-01-02 NOTE — Progress Notes (Signed)
I attempted to visit Derek Walker at his mother's house for psychopharmacology follow-up today but he was not home.  However, I spoke briefly with his mother who told me that Derek Walker was "doing OK" overall, and that he had left the house earlier today spend the day with his girlfriend.  F/U per ACT.

## 2016-01-04 ENCOUNTER — Ambulatory Visit: Payer: Self-pay

## 2016-01-05 NOTE — Progress Notes (Signed)
Clinical Management Note     Client was no show no call for his weekly appointment.

## 2016-01-09 ENCOUNTER — Ambulatory Visit: Payer: Self-pay

## 2016-01-10 ENCOUNTER — Ambulatory Visit: Payer: Self-pay | Admitting: Infectious Diseases

## 2016-01-10 NOTE — Progress Notes (Signed)
Clinical Management Note     Client was no show no call for today's scheduled appointment.

## 2016-01-11 ENCOUNTER — Ambulatory Visit: Payer: Self-pay

## 2016-01-12 NOTE — Progress Notes (Signed)
[facility] MISSED/CANCELLED APPOINTMENT     Name: Derek NielsenRALPH D Walker  MRN: [id number]   DOB: 22-Apr-1976    Date of Scheduled Service: 01/11/16    [name] was a no show for today's appointment.  I have spoken to his family and they advised that they last saw him on yesterday.  There is no alternate contact for client at this time.     Additional Information:    N/A

## 2016-01-16 ENCOUNTER — Ambulatory Visit: Payer: Self-pay

## 2016-01-16 DIAGNOSIS — F329 Major depressive disorder, single episode, unspecified: Secondary | ICD-10-CM

## 2016-01-16 DIAGNOSIS — F1010 Alcohol abuse, uncomplicated: Secondary | ICD-10-CM
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
## 2016-01-18 ENCOUNTER — Ambulatory Visit: Payer: Self-pay | Admitting: Psychiatry

## 2016-01-18 ENCOUNTER — Encounter: Payer: Self-pay | Admitting: Psychiatry

## 2016-01-18 NOTE — Progress Notes (Signed)
[facility] MISSED/CANCELLED APPOINTMENT     Name: Derek NielsenRALPH D Bednarski  MRN: [id number]   DOB: Oct 21, 1975    Date of Scheduled Service: 01/18/2016    Writer called the patient at home and his mother answered the phone. Per the mother, patient was not home, likely went to his girlfriend's place, and she was unsure when he was returning home. She gave the writer the girlfriend's phone number. Mother did not have any psychiatric concerns about the patient, but she thought he was not eating "as he should" and she wonders why he is not getting Ensure from his PCP. Mother stated she will let the patient know that writer called when he returns home.     Writer called the girlfriends' home a few times [phone]) , but there was no answer and unable to leave a message.      I have discussed today's appointment with my team.     Additional Information:    Not applicable

## 2016-01-23 ENCOUNTER — Ambulatory Visit: Payer: Self-pay

## 2016-01-23 DIAGNOSIS — F329 Major depressive disorder, single episode, unspecified: Secondary | ICD-10-CM

## 2016-01-25 ENCOUNTER — Ambulatory Visit: Payer: Self-pay

## 2016-01-25 DIAGNOSIS — F329 Major depressive disorder, single episode, unspecified: Secondary | ICD-10-CM

## 2016-01-30 NOTE — Progress Notes (Signed)
Behavioral Health Progress Note     LENGTH OF SESSION: 15 minutes    Contact Type:  Location: Off Site    Face to Face     Problem(s)/Goals Addressed from Treatment Plan:    Problem 1:   R PSY TP PROBLEM 07/26/2015   1ST TREATMENT PLAN PROBLEM unstable housing       Goal for this problem:    R PSY TP GOAL 07/26/2015   1ST TREATMENT PLAN GOAL "I will get my own apartment asap".         Progress towards this goal: Client planning to start [employer] for substance abuse treatment on 5/25.    Mental Status Exam:  APPEARANCE: Casual  ATTITUDE TOWARD INTERVIEWER: Cooperative  MOTOR ACTIVITY: WNL (within normal limits)  EYE CONTACT: Direct  SPEECH: Normal rate and tone  AFFECT: Full Range  MOOD: Normal  THOUGHT PROCESS: Normal  THOUGHT CONTENT: No unusual themes  PERCEPTION: Within normal limits  CURRENT SUICIDAL IDEATION: patient denies  CURRENT HOMICIDAL IDEATION: Patient denies  ORIENTATION: Alert and Oriented X 3.  CONCENTRATION: WNL  MEMORY:   Recent: intact   Remote: intact  COGNITIVE FUNCTION: Average intelligence  JUDGMENT: Intact  IMPULSE CONTROL: Intact  INSIGHT: Fair    Risk Assessment:  Violence risk was assessed and No Change noted from baseline formulation of risk and/or previous assessment.    Session Content::  Jaramiah met with Probation officer on the porch at his residence.  He reports that the RGE was due to be turned on today.  He kept his DSS appointment and is due to start substance abuse treatment at [facility] on 5/25.  He reports that his medicaid has been activated.  He denies SI/HI and denies AH/VH and none are evidenced.  His appetite and sleep are stable.  He has had no further conflicts.  He reports that he had learned that his son had ran away from home and that was why he had been hanging out at his house therefore he had sent him home to face responsibility for his actions and was able to reflect back that he didn't want his son to make some of the same mistakes that he had made.    Visit Diagnosis:       ICD-10-CM ICD-9-CM   1. Major depression F32.9 296.20       Interventions:  Supportive Psychotherapy    Current Treatment Plan   Created/Updated On 07/26/2015   Next Treatment Plan Due 01/11/2016         Plan:  Psychotherapy continues as described in care plan; plan remains the same.    NEXT APPT: 01/30/16      Maryjane Hurter, LCSW
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
## 2016-02-01 ENCOUNTER — Ambulatory Visit: Payer: Self-pay

## 2016-02-01 DIAGNOSIS — F329 Major depressive disorder, single episode, unspecified: Secondary | ICD-10-CM
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
## 2016-02-07 ENCOUNTER — Ambulatory Visit: Payer: Self-pay

## 2016-02-07 ENCOUNTER — Ambulatory Visit: Payer: Self-pay | Admitting: Infectious Diseases

## 2016-02-08 ENCOUNTER — Ambulatory Visit: Payer: Self-pay

## 2016-02-08 NOTE — Progress Notes (Signed)
[facility] MISSED/CANCELLED APPOINTMENT     Name: Sunnie NielsenRALPH D Bayman  MRN: [id number]   DOB: [birth year]    Date of Scheduled Service: 02/07/16    Mr. Doubrava was a no show for today's appointment.  I have spoken to the client and he advised that he was in route to his medical appointment and would not be needing a ride.  He agreed to Regulatory affairs officercall writer after his appointment in order to schedule a home visit, however, writer did not hear back from him by close of business.      Additional Information:    Not applicable

## 2016-02-08 NOTE — Progress Notes (Addendum)
[facility] MISSED/CANCELLED APPOINTMENT     Name: Derek NielsenRALPH D Walker  MRN: [id number]   DOB: Jul 08, 1976    Date of Scheduled Service: 02/08/16    [name] was a no show for today's appointment. A home visit was attempted with no success and I have left client a message requesting that he call back to writer to schedule.      Additional Information:    Not applicable

## 2016-02-09 ENCOUNTER — Encounter: Payer: Self-pay | Admitting: Psychiatry

## 2016-02-09 ENCOUNTER — Ambulatory Visit: Payer: Self-pay | Admitting: Psychiatry

## 2016-02-09 NOTE — Progress Notes (Signed)
[facility] MISSED/CANCELLED APPOINTMENT     Name: Sunnie NielsenRALPH D Pack  MRN: [id number]   DOB: 09-30-1975    Date of Scheduled Service: td.     Writer spoke with the patient's mother who stated that he hasn't been home for the past couple of days and believes that he may be with his girlfriend. She stated she will let the patient know when he contacts/comes home so that writer can do a home visit today.  I have discussed today's appointment with my team.     Additional Information:    Not applicable

## 2016-02-13 ENCOUNTER — Ambulatory Visit: Payer: Self-pay

## 2016-02-14 ENCOUNTER — Ambulatory Visit: Payer: Self-pay | Admitting: Infectious Diseases

## 2016-02-14 ENCOUNTER — Ambulatory Visit: Payer: Self-pay

## 2016-02-14 NOTE — Progress Notes (Signed)
[facility] MISSED/CANCELLED APPOINTMENT     Name: Derek NielsenRALPH D Walker  MRN: [id number]   DOB: Nov 15, 1975    Date of Scheduled Service: 02/14/16    [name] was a no show for today's appointment.  I have been advised by client's uncle that client left a little before writer's arrival [facility]    Additional Information:    Not applicable

## 2016-02-14 NOTE — Progress Notes (Signed)
[facility] MISSED/CANCELLED APPOINTMENT     Name: Derek NielsenRALPH D Walker  MRN: [id number]   DOB: 1975-09-23    Date of Scheduled Service: 02/13/16    Derek Walker was a no show for today's appointment.  I have spoken with the patient by phone.  The patient stated the reason for today's absence is due to the fact that he stayed with his sister over night and was headed home now.  Derek Walker further reports that he [facility] [address] [address].  He agreed to meet with [facility] on 5/31 and reported that he would be home all day tomorrow.  .    Additional Information:    Not applicable

## 2016-02-20 ENCOUNTER — Ambulatory Visit: Payer: Self-pay

## 2016-02-20 DIAGNOSIS — F329 Major depressive disorder, single episode, unspecified: Secondary | ICD-10-CM
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
## 2016-02-21 ENCOUNTER — Ambulatory Visit: Payer: Self-pay | Admitting: Infectious Diseases

## 2016-02-21 NOTE — BH Treatment Plan (Signed)
[facility] Treatment Plan     Date of Plan:   R PSY TP DATE FROM 02/21/2016   FROM 02/21/2016      Created/Updated On 02/21/2016   Next Treatment Plan Due 08/22/2016       Diagnostic Impression    1.  PTSD   F43.10  2.  Major Depression F 32.9  3.  Alcohol Abuse F10.10  4. Cannabis F12.10      Strengths  Strengths derived from the assessment include:  Derek Walker is an intelligent man who is motivated to reduce his suffering.  At times this includes but is not limited to self care.  He has been some what consistent with keeping ACT appointments.  He appears to engage with one person and his attendance is better when he knows he will be meeting with that individual.  He is connected with the mother of his children and has supportive family in the community.      Problem Areas  *At least one problem must be targeted toward risk reduction if Formulation of Risk or any other previous exam indicated special monitoring or intervention for suicide and/or violence risk indicated.    PROBLEM AREAS (choose and describe relevant):  MOOD:  Derek Walker has a diagnosis of depression and his alcohol and drug use exasperate his mood fluctuations.    BEHAVIOR:   Derek Walker has not connected with traditional treatment in the past.   HOUSING: Derek Walker has had a harrd time finding safe and affordable housing.  Due to being on DSS and the sanction due to his lack of follow thru he had no funds for housing.   OTHER:   Alcohol and Cannabis use.  ________________________________________________________________  R PSY TP PROBLEM 02/21/2016   1ST TREATMENT PLAN PROBLEM unstable housing        R PSY TP GOAL 02/21/2016   1ST TREATMENT PLAN GOAL "I will get my own apartment asap".         The rationale for addressing this problem is that resolving it will (select all that apply):  Is a key motivational factor for the patient's participation in treatment      Progress toward goal(s): Derek Walker continues to live at family and the mother of his children homes.  In the  last 6 months he was sanctioned by DSS and therefore did not have any entitlements in order to pay for a place to live during that time.  He has reconnected with CD treatment in order to lift the sanction.  Since he has enrolled in [address] for his CD treatment  he has been more engaged with treatment with [facility].  He has also had several changes in primary ACT worker in the last 6 months and that too has made his engagement in ACT services more fragile, but when he has consistent appointment times and dates he is more likely to keep the appointments.      1 a. Measurable Objectives : I will follow through on using the housing services of [facility] Derek Walker)    Date established: 01/11/2015   Target date: 04/12/2015   Attained or Revised? D/c  This objective has been discontinued due to the fact that the time period has passed and Derek Walker is not using that particular support at this time.     1 b. Measurable Objectives :  I will actively work on steps each week; search apartment listings , phone calls, complete applications, etc.    Date established: 01/11/2015  Target date: 04/11/2016   Attained or Revised? D/C  This objective has been discontinued and tasks have been added to objective # 3 of this goal.     1 c. Measurable Objectives :  I will meet with ACT staff at least twice monthly  to discuss and receive assistance in searching for safe and affordable housing, scheduling tours, making phone calls, completing applications, etc as demonstrated by Derek Walker having a safe and affordable place to live in the next 6 months.     Date established: 01/11/2015   Target date: 08/2016   Attained or Revised? Revised as follows:   Revised.  Objective was revised to add the tasks associated with Objective 2 into Objective 3 in order for the ACT team to be of present while Derek Walker performs these tasks to provide reinforcement, encouragement and information as needed.     Interventions:  ACT will access wrap funds for a household  needs on a as needed basis.  ACT will provide assistance and education on finding safe and affordable housing by assisting him with researching suitable locations based on his needs and preferences.   ACT will assist client in negotiating leases, researching methods of paying his rent, purchasing house hold necessities and developing a relationship with his landlord.        R PSY TP PROBLEM 2 02/21/2016   2ND TREATMENT PLAN PROBLEM undereducated       R PSY TP GOAL 2 02/21/2016   2ND TREATMENT PLAN GOAL "I will apply to go back to school within six months".         This goal was discontinued on the last 6 month review as it was Derek Walker's desire to focus his energies on his health and housing at that time.  See previous treatment plan     .........................................................................................................................................Marland Kitchen.    R PSY TP PROBLEM 3 02/21/2016   3RD TREATMENT PLAN PROBLEM poor physical and mental health       R PSY TP GOAL 3 02/21/2016   3RD TREATMENT PLAN GOAL "I will get my health back in order (nutrition, weight, exercise)".       The rationale for addressing this problem is that resolving it will (select all that apply):  Reduce functional impairment associated with disorder, Decrease likelihood of hospitalization, Facilitate transfer skills learned in therapy to everday life, Is a key motivational factor for the patient's participation in treatment and Reduce risk for violence    Progress toward goal(s):  Derek Walker  keeps appointments sporadically with medical providers, but has kept enough in the last six months in order to obtain medications from the Infectious Disease care providers.  Derek Walker's primary diagnosis of PTSD is a barrier to him at times with his medical care.  He has maintained his medical care in the last six months with out an interruption in care.  The focus has been on attending appointments in the last six months and although the concerns about  nutrition, weight and exercise are important he has been focused on attending appointments and staying up to date with his care providers.     3 a. Measurable Objectives :  I will keep my medical appointments and engage in discussion regarding their recommendations to improve my medical conditions.  As demonstrated by attending enough medical appointments to remain in treatment and have access to my medications if so desired.      Date established: 01/11/2015   Target date: 08/2016   Attained or Revised? Revised as follows:  Revised.  This objective has been revised to support linkage to treatment and to empower Derek Walker to  discuss with care providers any concerns to reinforce his desire to enhance his mental and physical health.     3 b. Measurable Objectives :  I will exercise at least 3 X per week    Date established: 01/11/2015   Target date: 07/13/15   Attained or Revised? D/C  This is objective has been discontinued on the last treatment plan.     3 c. Measurable Objectives :  I will meet with IDDT Specialist/ACT therapists  at least three times per month to discuss the relationship between mental health, physical health and substance use.    Date established:  01/11/2015   Target date:  08/22/2016              Revised:  This has been revised to include all of the ACT team members.     3 d. Measurable Objectives :  I will learn 2-3 new coping skills to assist with my anxiety, pain, depression and poor sleep. Derek Walker will identify alternatives to drinking alcohol to cope with anxiety , at least one time each month.  Revised:  Derek Walker will identify one of the coping skills as an alternative to drinking in the next treatment period.      Date established: 01/11/2016   Target date: 08/22/2016   Attained or Revised? Revised as follows:  To combine the first two portions of the objective together.      3.e. Measurable Objectives : I will consistently attend my medical appointments for physical and mental health    Date  established: 01/11/15   Target date: 04/12/15   Attained or Revised? D/C This objective has been discontinued and the task will be addressed in objective #1.     3. f. Measurable Objectives :  I will take my medications as prescribed and will communicate my concerns and questions.    Date established: 01/11/15   Target date: 04/11/2016   Attained or Revised? Attained during the last treatment period.       Interventions:    ACT nursing staff will provide nutritional counseling and or refer to an RD or other nutrition education in the community as needed.   ACT may access wrap funds as needed.   ACT IDDT/ACT therapists will provide assessment and SAS counseling at least 2 X each month.  ACT staff will provide psycho education and skill training to develop coping strategies.   ACT staff will assist Derek Walker in coordinating care such as making and keeping appointments as needed  ACT staff will collaborate with other providers for continuity of care as needed.  ACT staff will provide weekly or at least monthly medication support and management.  ACT staff will provide psycho pharm examination each month, with a psychiatrist performing the examination at least 1 X each 3 months.                  DISCHARGE CRITERIA for this treatment setting:  Derek Walker will be able to be treated by a lower level of care when he has the necessary natural supports in place in the community and he is engaged in medical treatment with consistancy.     Clinician's name: Cammy Copa Samhita Kretsch, [facility]    Psychiatrist's Name:  Dr. Sherril Cong         Patient/Family Statement  PATIENT/FAMILY STATEMENT:  Obtain patient and family input into the treatment plan, including areas of agreement /  disagreement.  Obtain patient's signature - if not possible, briefly describe the reason.     Patient Comments:          I HAVE PARTICIPATED IN THE DEVELOPMENT OF THIS TREATMENT PLAN AND I AGREE WITH ITS CONTENTS:       Patient Signature:  _______________________________________________________    Date: ______________________________

## 2016-02-22 ENCOUNTER — Ambulatory Visit: Payer: Self-pay

## 2016-02-22 DIAGNOSIS — F329 Major depressive disorder, single episode, unspecified: Secondary | ICD-10-CM

## 2016-02-22 NOTE — Progress Notes (Signed)
Behavioral Health Progress Note     LENGTH OF SESSION: 20 minutes    Contact Type:  Location: Off Site    Face to Face     Problem(s)/Goals Addressed from Treatment Plan:    Problem 3:    R PSY TP PROBLEM 3 02/21/2016   3RD TREATMENT PLAN PROBLEM poor physical and mental health       Goal for this problem:    R PSY TP GOAL 3 02/21/2016   3RD TREATMENT PLAN GOAL "I will get my health back in order (nutrition, weight, exercise)".       Progress towards this goal: Client met with Probation officer on his porch and was in good spirits and feeling optimistic regarding his care.      Mental Status Exam:  APPEARANCE: Casual  ATTITUDE TOWARD INTERVIEWER: Cooperative  MOTOR ACTIVITY: WNL (within normal limits)  EYE CONTACT: Direct  SPEECH: Normal rate and tone  AFFECT: Full Range  MOOD: Normal  THOUGHT PROCESS: Normal  THOUGHT CONTENT: No unusual themes  PERCEPTION: Within normal limits  CURRENT SUICIDAL IDEATION: patient denies  CURRENT HOMICIDAL IDEATION: Patient denies  ORIENTATION: Alert and Oriented X 3.  CONCENTRATION: WNL  MEMORY:   Recent: intact   Remote: intact  COGNITIVE FUNCTION: Average intelligence  JUDGMENT: Intact  IMPULSE CONTROL: Good  INSIGHT: Good    Risk Assessment:  ASSESSMENT OF RISK FOR SUICIDAL BEHAVIOR  Changes in risk for suicide from baseline Formulation of Risk and/or previous intake, including newly identified risk, if any: none    Session Content::  Writer met with client this date and reviewed his treatment plan and completed the Quality of Life form.  Client agreed to the plan and singed the treatment plan.    He reported that he attended group this morning and reports that he enjoyed it as the participants and facilitator were engaging and he felt good about attending.      In addition he reported that he had contacted DSS as he is still sanctioned and was advised that they were still in need of speaking with  [address] and therefore he should await to hear back from them via a letter.    Derek Walker also  reported that he did not make it to his [facility] appointment due to the appointment was too close to the time in which he would get out of group and therefore he has rescheduled it and his counselor has given him permission to leave group earlier and provided him with a letter to be signed by his provider.    We also reviewed his June calendar for the month.      Visit Diagnosis:      ICD-10-CM ICD-9-CM   1. Major depression F32.9 296.20       Interventions:  Treatment Planning, Quality of Life Form    Current Treatment Plan   Created/Updated On 02/21/2016   Next Treatment Plan Due 08/22/2016         Plan:  Psychotherapy continues as described in care plan; plan remains the same.    NEXT APPT: 02/26/16      Maryjane Hurter, LCSW

## 2016-02-26 ENCOUNTER — Ambulatory Visit: Payer: Self-pay

## 2016-02-28 ENCOUNTER — Ambulatory Visit: Payer: Self-pay

## 2016-02-28 ENCOUNTER — Ambulatory Visit: Payer: Self-pay | Admitting: Infectious Diseases

## 2016-02-29 ENCOUNTER — Ambulatory Visit: Payer: Self-pay | Admitting: Psychiatry

## 2016-03-01 ENCOUNTER — Ambulatory Visit: Payer: Self-pay

## 2016-03-01 NOTE — Progress Notes (Signed)
[facility] MISSED/CANCELLED APPOINTMENT     Name: Derek Walker  MRN: [id number]   DOB: 09/07/1976    Date of Scheduled Service: 03/01/2016    [name] was a no show for today's appointment.  I have placed this information in the ACT log book.     Additional Information:    Writer texted the client as agreed, he called me back a hour and half later.  I called him and he then called me back and we spoke at 3:00pm.  He was very friendly on the phone but said that he had forgotten about getting together in the morning.  [facility].

## 2016-03-04 ENCOUNTER — Ambulatory Visit: Payer: Self-pay

## 2016-03-04 DIAGNOSIS — F419 Anxiety disorder, unspecified: Secondary | ICD-10-CM

## 2016-03-05 ENCOUNTER — Ambulatory Visit: Payer: Self-pay

## 2016-03-06 ENCOUNTER — Ambulatory Visit: Payer: Self-pay

## 2016-03-06 ENCOUNTER — Ambulatory Visit: Payer: Self-pay | Admitting: Infectious Diseases

## 2016-03-07 NOTE — Progress Notes (Signed)
Behavioral Health Progress Note     LENGTH OF SESSION: 30 minutes    Contact Type:  Location: Off Site    Face to Face     Problem(s)/Goals Addressed from Treatment Plan:    Problem 3:    R PSY TP PROBLEM 3 02/21/2016   3RD TREATMENT PLAN PROBLEM poor physical and mental health       Goal for this problem:    R PSY TP GOAL 3 02/21/2016   3RD TREATMENT PLAN GOAL "I will get my health back in order (nutrition, weight, exercise)".       Progress towards this goal: client has not been taking his medications as presribed.       Mental Status Exam:  APPEARANCE: Appears stated age, Casual  ATTITUDE TOWARD INTERVIEWER: Cooperative  MOTOR ACTIVITY: WNL (within normal limits)  EYE CONTACT: Direct  SPEECH: Normal rate and tone  AFFECT: Flat  MOOD: Dysphoric  THOUGHT PROCESS: Intact  THOUGHT CONTENT: No unusual themes  PERCEPTION: No evidence of hallucinations  CURRENT SUICIDAL IDEATION: patient denies  CURRENT HOMICIDAL IDEATION: Patient denies  ORIENTATION: Alert and Oriented X 3.  CONCENTRATION: Good  MEMORY:   Recent: intact   Remote: intact  COGNITIVE FUNCTION: Average intelligence  JUDGMENT: Intact  IMPULSE CONTROL: Fair  INSIGHT: Good    Risk Assessment:  Violence risk was assessed and No Change noted from baseline formulation of risk and/or previous assessment.    Session Content::  Writer visited client at his home and he and I discussed how his personal wellness is going.  He has not had his medical medications or his mental health medications.  I assisted client in calling his refill in and we made a plan to pick it up the following day.  He shared some paperwork indicating that his foodstamp are active bout his basic assistance thru DSS requires that he go there and request a appointment.  We agreed to address that the following day as well, after picking up his medications at the pharmacy.  He asked for a appointment with prescriber for ACT so that he can get his mental health medications.  He will have to address  the DSS issue by 03/09/2016 that is 10 days after the letter was written.  I shared that he has an appointment on 03/14/2016 with the infectious disease clinic.  [facility] after he had group and a one on one after group.  I agreed to check on [facility] housing to see if he was eligible to live with them once his Basic Assistance became active.      Visit Diagnosis:    ICD-10-CM ICD-9-CM   1. Anxiety F41.9 300.00       Interventions:  Motivational Interviewing    Current Treatment Plan   Created/Updated On 02/21/2016   Next Treatment Plan Due 08/22/2016         Plan:  Psychotherapy continues as described in care plan; plan remains the same.    NEXT APPT: 03/05/2016      Cammy CopaAbigail Fayelynn Distel, [facility]

## 2016-03-08 ENCOUNTER — Ambulatory Visit: Payer: Self-pay | Admitting: Psychiatry

## 2016-03-11 ENCOUNTER — Ambulatory Visit: Payer: Self-pay | Admitting: Psychiatry

## 2016-03-14 ENCOUNTER — Ambulatory Visit: Payer: Self-pay | Admitting: Infectious Diseases

## 2016-03-20 ENCOUNTER — Ambulatory Visit: Payer: Self-pay | Admitting: Psychiatry

## 2016-03-20 ENCOUNTER — Encounter: Payer: Self-pay | Admitting: Psychiatry

## 2016-03-20 NOTE — Progress Notes (Signed)
[facility] MISSED/CANCELLED APPOINTMENT     Name: Derek Walker  MRN: [id number]   DOB: 12-15-1975    Date of Scheduled Service: 03/20/2016     [name] was not at [employer] [employer] when [name] attempted to visit him. Writer called home; per patietn's mother he will not be home until noon. Writer called two other cell numbers and left a message to one of them.   I have discussed today's appointment with my team.     Additional Information:    Not applicable

## 2016-03-29 ENCOUNTER — Ambulatory Visit: Payer: Self-pay | Admitting: Psychiatry

## 2016-03-29 ENCOUNTER — Encounter: Payer: Self-pay | Admitting: Psychiatry

## 2016-03-29 DIAGNOSIS — F333 Major depressive disorder, recurrent, severe with psychotic symptoms: Secondary | ICD-10-CM

## 2016-03-29 MED ORDER — OLANZAPINE 5 MG PO TABS *I*
ORAL_TABLET | ORAL | 2 refills | Status: DC
Start: 2016-03-29 — End: 2016-07-31

## 2016-03-29 MED ORDER — GABAPENTIN 100 MG PO CAPSULE *I*
ORAL_CAPSULE | ORAL | 2 refills | Status: DC
Start: 2016-03-29 — End: 2017-02-05

## 2016-03-29 MED ORDER — PRAZOSIN HCL 1 MG PO CAPS *I*
1.0000 mg | ORAL_CAPSULE | Freq: Every evening | ORAL | 2 refills | Status: DC
Start: 2016-03-29 — End: 2016-07-31

## 2016-03-29 MED ORDER — MIRTAZAPINE 15 MG PO TABS *I*
15.0000 mg | ORAL_TABLET | Freq: Every evening | ORAL | 1 refills | Status: DC
Start: 2016-03-29 — End: 2016-07-31

## 2016-03-29 MED ORDER — GABAPENTIN 100 MG PO CAPSULE *I*
ORAL_CAPSULE | ORAL | 2 refills | Status: DC
Start: 2016-03-29 — End: 2016-03-29
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
## 2016-04-01 ENCOUNTER — Ambulatory Visit: Payer: Self-pay

## 2016-04-01 DIAGNOSIS — F333 Major depressive disorder, recurrent, severe with psychotic symptoms: Secondary | ICD-10-CM

## 2016-04-02 NOTE — Progress Notes (Signed)
[facility] TEAM Clinical Management Note     Informed by ACT Team staff Derek BreechJacquie Walker in am meeting that pt had advised he was unable to pick [facility] his [facility].     [facility] via [phone] to obtain update - pharmacy staff indicates that 30 day supply was picked up on [id number] and that olanzapine script is presently ready for pick up.     Advised J Walker to the above, she will alert Derek SextonRalph.     Derek AppleLisa A Misty Rago, RN

## 2016-04-03 ENCOUNTER — Ambulatory Visit: Payer: Self-pay

## 2016-04-03 NOTE — Progress Notes (Signed)
[facility] MISSED/CANCELLED APPOINTMENT     Name: Derek NielsenRALPH D Walker  MRN: [id number]   DOB: [birth year]    Date of Scheduled Service: 04/03/16    [name] was a no show for today's appointment.  I have attempted a home visit and learned client was not in and therefore called Derek SextonRalph who agreed to meet writer in an hour only to discover that he had not arrived.  Upon speaking to him a second time we agreed to another time with plan for client to call writer, however, [name] did not get a call back until well after the agreed upon time and therefore, plans were made for writer to meet with him on tomorrow.  Additional Information:    Not applicable

## 2016-04-04 ENCOUNTER — Ambulatory Visit: Payer: Self-pay
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
## 2016-04-08 ENCOUNTER — Ambulatory Visit: Payer: Self-pay

## 2016-04-08 NOTE — Progress Notes (Signed)
[facility] MISSED/CANCELLED APPOINTMENT     Name: Derek Walker  MRN: [id number]   DOB: 05-22-1976    Date of Scheduled Service: 04/08/16    [name] was a no show for today's appointment.  I have attempted a home visit to client and was advised by his mother that [name] had left a short while ago to visit with his girlfriend, however, she did not know the address and or when he would return.  Writer did also attempt to call client but the outgoing message advised that the client was not accepting calls.      Additional Information:    Not applicable

## 2016-04-08 NOTE — Progress Notes (Signed)
Behavioral Health Progress Note     LENGTH OF SESSION: 45 minutes    Contact Type:  Location: Off Site    Face to Face     Problem(s)/Goals Addressed from Treatment Plan:    Problem 1:   Treatment Problem #1 02/21/2016   Patient Identified Problem unstable housing       Goal for this problem:    Treatment Goal #1 02/21/2016   Patient Identified Goal "I will get my own apartment asap".         Progress towards this goal: Problem resolving. Comment: Mr. Coutts went to order a background check for his apartment search      Mental Status Exam:  APPEARANCE: Appears stated age, Casual  ATTITUDE TOWARD INTERVIEWER: Cooperative  MOTOR ACTIVITY: WNL (within normal limits)  EYE CONTACT: Direct  SPEECH: Normal rate and tone  AFFECT: Full Range  MOOD: Neutral  THOUGHT PROCESS: Normal  THOUGHT CONTENT: No unusual themes  PERCEPTION: Within normal limits  CURRENT SUICIDAL IDEATION: patient denies  CURRENT HOMICIDAL IDEATION: Patient denies  ORIENTATION: Alert and Oriented X 3.  CONCENTRATION: WNL  MEMORY:   Recent: intact   Remote: intact  COGNITIVE FUNCTION: Average intelligence  JUDGMENT: Intact  IMPULSE CONTROL: Good  INSIGHT: Age appropriate    Risk Assessment:  ASSESSMENT OF RISK FOR SUICIDAL BEHAVIOR  Changes in risk for suicide from baseline Formulation of Risk and/or previous intake, including newly identified risk, if any: none    Violence risk was assessed and No Change noted from baseline formulation of risk and/or previous assessment.    Session Content: Mr. Skalicky was picked up from his substance abuse group at [facility] by [place], LCSW and this Clinical research associate. He was adequately groomed and dressed appropriately for the weather. He was pleasant and receptive to [place] picking him up in the car. The plan was to go and order a background check for him for his apartment applications. He forgot his Social Security card, so we first went to his home so that he could get it. He indicated that he knows where it is and that he keeps  his important documents in various places in the house where his nephews can't steal them. In the car we discussed his group which he said went OK that day, that there was enough discussion. He revealed that he likes that he doesn't know anyone in the group otherwise he would not talk in the group. He says that he does not find the groups particularly helpful and that he would like to stop going, but understands that he has to. When we got to the police station, Andras and this Clinical research associate went inside for him to order it. He patiently filled out the necessary paperwork and the team was able to pay the $15 for the report for him. After this was finished Loi indicated that he would like to be driven home. He explained to this Clinical research associate that he would like to move because there are too many people living at his mother's house. He said that there are at least 6, and that he wants to be able to be alone more. He filled out his safety plan while we drove. He did not ask any questions about the form. Anias was cooperative and pleasant throughout the encounter. He offered no additional complaints and went inside the house in no apparent distress.    Visit Diagnosis:  No diagnosis found.    Interventions:  DHS paperwork, Problem Solving Therapy skills, Safety  Planning.  Refer to safety plan document that was created at today's visit.    Current Treatment Plan   Created/Updated On 02/21/2016   Next Treatment Plan Due 08/22/2016         Plan:  Psychotherapy continues as described in care plan; plan remains the same.    NEXT APPT: 7/24 at home      Pearletha Alfred, LMSW

## 2016-04-10 ENCOUNTER — Ambulatory Visit: Payer: Self-pay

## 2016-04-10 NOTE — Progress Notes (Signed)
[facility] MISSED/CANCELLED APPOINTMENT     Name: Derek Walker  MRN: [id number]   DOB: [birth year]    Date of Scheduled Service: 02/09/16    [name] was a no show for today's appointment.  I have done a home visit and was unable to find client and client's phone number continues to be out of service.    Additional Information:    Not applicable

## 2016-04-15 ENCOUNTER — Ambulatory Visit: Payer: Self-pay | Admitting: Psychiatry

## 2016-04-22 ENCOUNTER — Ambulatory Visit: Payer: Self-pay

## 2016-04-22 NOTE — Progress Notes (Signed)
[facility] MISSED/CANCELLED APPOINTMENT     Name: Derek NielsenRALPH D Walker  MRN: [id number]   DOB: [birth year]    Date of Scheduled Service: 04/22/16    Mr. [name] was a no show for today's appointment.  I have attempted 2 home visits & two attempted phone calls to client without success.     Additional Information:    Writer left client's monthly calendar with his uncle in hopes of better connecting with client for future visits.

## 2016-04-24 ENCOUNTER — Ambulatory Visit: Payer: Self-pay

## 2016-04-24 NOTE — Progress Notes (Signed)
[facility] MISSED/CANCELLED APPOINTMENT     Name: Derek NielsenRALPH D Walker  MRN: [id number]   DOB: [birth year]    Date of Scheduled Service: 04/24/16    Derek Walker was a no show for today's appointment.  I have attempted a home visit and client was not present.  Client's uncle was reported that he had given client the calendar and message to Regulatory affairs officercall writer.  He would advise Derek SextonRalph that I had stopped by today.    Additional Information:    Not applicable

## 2016-04-29 ENCOUNTER — Ambulatory Visit: Payer: Self-pay | Admitting: Psychiatry

## 2016-04-29 ENCOUNTER — Telehealth: Payer: Self-pay | Admitting: Psychiatry
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
## 2016-05-01 ENCOUNTER — Ambulatory Visit: Payer: Self-pay

## 2016-05-01 DIAGNOSIS — F333 Major depressive disorder, recurrent, severe with psychotic symptoms: Secondary | ICD-10-CM

## 2016-05-03 ENCOUNTER — Telehealth: Payer: Self-pay

## 2016-05-03 NOTE — Telephone Encounter (Signed)
[facility] Telephone Call     Date of call: 05/03/2016    Name: Derek Walker   DOB: April 21, 1976   MRN: [id number]     05/03/16     [facility] [phone] who advised that client had an order of Olanzapine ready on 7/14 but never picked up the order.  Writer requested that the medication to be filled and would be on hand for 12 days before returning to hold status.

## 2016-05-03 NOTE — Telephone Encounter (Signed)
[facility] Telephone Call     Date of call: 05/03/2016    Name: Derek Walker   DOB: 09-03-1976   MRN: [id number]     05/03/16    Writer texted client to advise that [facility].

## 2016-05-03 NOTE — Progress Notes (Signed)
Behavioral Health Progress Note     LENGTH OF SESSION: 30  minutes    Contact Type:  Location: Off Site    Face to Face     Problem(s)/Goals Addressed from Treatment Plan:    Problem 1:   Treatment Problem #1 02/21/2016   Patient Identified Problem unstable housing       Goal for this problem:    Treatment Goal #1 02/21/2016   Patient Identified Goal "I will get my own apartment asap".         Progress towards this goal: [name] met with Probation officer at the [employer] and reports that he has been staying with his daughter and continues to [employer] with his [employer].  He requests that some documentation be completed for Social Security.      Mental Status Exam:  APPEARANCE: Casual  ATTITUDE TOWARD INTERVIEWER: Cooperative  MOTOR ACTIVITY: WNL (within normal limits)  EYE CONTACT: Direct  SPEECH: Normal rate and tone  AFFECT: Appropriate  MOOD: Normal  THOUGHT PROCESS: Normal  THOUGHT CONTENT: No unusual themes  PERCEPTION: No evidence of hallucinations  CURRENT SUICIDAL IDEATION: patient denies  CURRENT HOMICIDAL IDEATION: Patient denies  ORIENTATION: Alert and Oriented X 3.  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  Client notes that he has not been staying home much in order to decrease his level of stress and therefore he is staying at his daughter's residence along with his 65 yo daughter and the child's mother and his daughter, her boyfriend,and his 68 yo granddaughter.  He notes the environment to be less stressful for him despite the number of people in the residence and he notes that his daughter has no concerns with  him be there during this time.  Jahmere remains invested in finding permanent housing and notes that the place in which he had gotten the background check did not meet expectation, however, there is a possibility that it will be up to code 9/1 and if that is the case he remains open to it but in the meantime he continues to look for alternate housing.    Jameison also notes that he continues to attend CD treatment at [facility] and that his attendance has remained consistent.      He provided some documentation that is needed to be completed for Social Security.    Writer and client developed an attendance agreement for the remainder of the month, Mon/Wed @ 11:00, meeting at [place].      Client presents with a cold, mood is stable, denies AH/VH and SI/HI at the present time.      Josue reports that he has not followed up with [facility] and missed the last two appointments but advises that he planned to make another appointment today and was encouraged to f/u.  He is declining any assistance with this at this time.  Gevorg notes that he continues to take his meds as prescribed but notes that he never did receive the Olanzapine script despite returning to [facility] to follow up.  Visit Diagnosis:    ICD-10-CM ICD-9-CM   1. Major psychotic depression, recurrent F33.3 296.34       Interventions:  Wellness check    Current Treatment Plan   Created/Updated On 02/21/2016   Next Treatment Plan Due 08/22/2016         Plan:  Psychotherapy continues as described in care plan; plan remains the same.    NEXT APPT: 05/06/16      Maryjane Hurter, LCSW

## 2016-05-06 ENCOUNTER — Ambulatory Visit: Payer: Self-pay

## 2016-05-06 DIAGNOSIS — F333 Major depressive disorder, recurrent, severe with psychotic symptoms: Secondary | ICD-10-CM

## 2016-05-07 NOTE — Progress Notes (Signed)
Behavioral Health Progress Note     LENGTH OF SESSION: 20 minutes    Contact Type:  Location: Off Site    Face to Face     Problem(s)/Goals Addressed from Treatment Plan:    Problem 1:   Treatment Problem #1 02/21/2016   Patient Identified Problem unstable housing       Goal for this problem:    Treatment Goal #1 02/21/2016   Patient Identified Goal "I will get my own apartment asap".         Progress towards this goal: Problem worsening.  Comment: Rowan Blase for the scheduled visit at the [employer] where we reviewed and completed the documentation for [employer].  Ahkeem advised that his Olanzapine was due to be picked up.      Mental Status Exam:  APPEARANCE: Casual  ATTITUDE TOWARD INTERVIEWER: Cooperative  MOTOR ACTIVITY: WNL (within normal limits)  EYE CONTACT: Direct  SPEECH: Normal rate and tone  AFFECT: Full Range  MOOD: Normal  THOUGHT PROCESS: Normal  THOUGHT CONTENT: No unusual themes  PERCEPTION: Within normal limits  CURRENT SUICIDAL IDEATION: patient denies  CURRENT HOMICIDAL IDEATION: Patient denies  ORIENTATION: Alert and Oriented X 3.  CONCENTRATION: WNL  MEMORY:   Recent: intact   Remote: intact  COGNITIVE FUNCTION: Average intelligence  JUDGMENT: Intact  IMPULSE CONTROL: Intact  INSIGHT: Fair    Risk Assessment:  ASSESSMENT OF RISK FOR SUICIDAL BEHAVIOR  Changes in risk for suicide from baseline Formulation of Risk and/or previous intake, including newly identified risk, if any: none      Session Content::  Probation officer met with [name] at the [employer] at [employer] as agreed upon.  Client was waiting inside [employer] upon writer's arrival.  He noted that he was feeling much better as his cold had passed.  He has attended his CD group this day.  Writer informed him that the documents that he had supplied to writer last week were actually to be filled out by client.  Writer reviewed with him the documents and the areas in which writer was able to assist him with and  pointed out the release of information form that he was to sign along with the form that was requesting he supply information regarding his past work history.  Client was unable to recall the information by hand and writer coached him as to the necessary information that was required and how he might document the information to the best of his ability as he reported having difficulty recalling the requested info.  Client to complete the remainder of the documentation and return the form to Soc Sec.     Khaleel also informed that his Olanzapine was ready for pick up at [facility] has he "missed" writer's earlier text message advising him of this fact.  He agreed to go pick up the script after our meeting but declined a ride to do so in the moment.    Agreed upon th appointment schedule for the remainder of the month and the possibility of meeting with new ACT staff at our next visits as writer would be on vacation during the following week.  Client has possible educational goals of returning to school at [employer] and interested in the possibility of working with the substance abuse counselor for additional support.   Aubry also encouraged to think about how writer could best assist him in the future regarding his therapeutic needs and it was briefly discussed  the possibility of exploring his coping skills and addressing his PTSD symptoms as client reports he continues to experience startle responses.    Visit Diagnosis:    ICD-10-CM ICD-9-CM   1. Major psychotic depression, recurrent F33.3 296.34       Interventions:  Social security Paperwork, Treatment goals< Appointment Coordination    Current Treatment Plan   Created/Updated On 02/21/2016   Next Treatment Plan Due 08/22/2016         Plan:  Psychotherapy continues as described in care plan; plan remains the same.    NEXT APPT: 05/08/16      Maryjane Hurter, LCSW

## 2016-05-08 ENCOUNTER — Encounter: Payer: Self-pay | Admitting: Adult Health

## 2016-05-08 ENCOUNTER — Ambulatory Visit: Payer: Self-pay

## 2016-05-08 DIAGNOSIS — F333 Major depressive disorder, recurrent, severe with psychotic symptoms: Secondary | ICD-10-CM
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
## 2016-05-10 ENCOUNTER — Ambulatory Visit: Payer: Self-pay

## 2016-05-10 DIAGNOSIS — F333 Major depressive disorder, recurrent, severe with psychotic symptoms: Secondary | ICD-10-CM

## 2016-05-10 NOTE — Progress Notes (Signed)
Behavioral Health Progress Note     LENGTH OF SESSION: 20 minutes    Contact Type:  Location: Off Site    Face to Face     Problem(s)/Goals Addressed from Treatment Plan:    Problem 1:   Treatment Problem #1 02/21/2016   Patient Identified Problem unstable housing       Goal for this problem:    Treatment Goal #1 02/21/2016   Patient Identified Goal "I will get my own apartment asap".         Progress towards this goal: Client reports no HI, has been drinking today.      Mental Status Exam:  APPEARANCE: Casual  ATTITUDE TOWARD INTERVIEWER: Cooperative  MOTOR ACTIVITY: WNL (within normal limits)  EYE CONTACT: Direct  SPEECH: Normal rate and tone  AFFECT: Full Range  MOOD: Normal  THOUGHT PROCESS: Normal  THOUGHT CONTENT: No unusual themes  PERCEPTION: Within normal limits  CURRENT SUICIDAL IDEATION: patient denies  CURRENT HOMICIDAL IDEATION: Patient denies  ORIENTATION: Alert and Oriented X 3.  CONCENTRATION: WNL  MEMORY:   Recent: intact   Remote: intact  COGNITIVE FUNCTION: Average intelligence  JUDGMENT: Intact  IMPULSE CONTROL: Intact  INSIGHT: Fair    Risk Assessment:  Violence Risk Screening:  Violence risk was assessed and No Change noted from baseline formulation of risk and/or previous assessment.    -    Session Content::  Derek Walker did not meet with [name] at [employer] as agreed upon but ultimately consented to [employer] meeting him downtown where he reported he had stayed the night with his aunt.  He notes that he felt better "as he got away from it all" and that he was able to help out his aunt move furniture and in turn she "fed him and bought him beer".  Client reports that CPS called him this morning as his 40 yo is in juvenile detention and 40 yo is in jail (doing 5 years).  CPS exploring as to rather client could possibly take the 40 yo as he will likely not be able to return to the mother.  He notes that he would like to help but recognizes he has no place of his own and wants to be assured that his son  won't lie to him about what's going on as he has done so in the past.  He has no immediate concerns at this time.  Is still in agreement with meeting Derek Walker on Monday and receptive to Derek FallsMonika, NP calling him Derek BaxterWednes for his psychopharm visit.  Client denies current HI.  He has his medications on him despite his moving around.     Visit Diagnosis:    ICD-10-CM ICD-9-CM   1. Major psychotic depression, recurrent F33.3 296.34       Interventions:  Developed attendance agreement, Supportive Psychotherapy    Current Treatment Plan   Created/Updated On 02/21/2016   Next Treatment Plan Due 08/22/2016         Plan:  Psychotherapy continues as described in care plan; plan remains the same.    NEXT APPT: 05/13/16      Lowella DandyJacqueline Treyson Axel, LCSW

## 2016-05-13 ENCOUNTER — Ambulatory Visit: Payer: Self-pay

## 2016-05-13 ENCOUNTER — Other Ambulatory Visit: Payer: Self-pay | Admitting: Infectious Diseases

## 2016-05-13 NOTE — Progress Notes (Signed)
[facility]al Health Progress Note     LENGTH OF SESSION: 30 minutes    Contact Type:  Location: Off Site    Face to Face     Problem(s)/Goals Addressed from Treatment Plan:    Problem 1:   Treatment Problem #1 02/21/2016   Patient Identified Problem unstable housing       Goal for this problem:    Treatment Goal #1 02/21/2016   Patient Identified Goal "I will get my own apartment asap".         Progress towards this goal: Problem worsening.  Comment: Reports sleeping outdoors because he does not want to stay with family      Mental Status Exam:  APPEARANCE: Appears stated age, Well-groomed  ATTITUDE TOWARD INTERVIEWER: Cooperative  MOTOR ACTIVITY: WNL (within normal limits)  EYE CONTACT: Direct  SPEECH: Normal rate and tone  AFFECT: Full Range, Appropriate and Depressed  MOOD: Depressed  THOUGHT PROCESS: Normal  THOUGHT CONTENT: No unusual themes  PERCEPTION: No evidence of hallucinations  CURRENT SUICIDAL IDEATION: patient denies  CURRENT HOMICIDAL IDEATION: Patient denies  ORIENTATION: Alert and Oriented X 3.  CONCENTRATION: WNL  MEMORY:   Recent: intact   Remote: intact  COGNITIVE FUNCTION: Average intelligence  JUDGMENT: Impaired -  minimal  IMPULSE CONTROL: Poor  INSIGHT: Limited    Risk Assessment:  ASSESSMENT OF RISK FOR SUICIDAL BEHAVIOR  Changes in risk for suicide from baseline Formulation of Risk and/or previous intake, including newly identified risk, if any: none    Violence risk was assessed and No Change noted from baseline formulation of risk and/or previous assessment.  Session Content::  Writer met with Mr. Walts at the [employer] for a scheduled meeting. He was sitting on the benches on the baseball field, which is where we met. He was pleasant and receptive, though appeared to be intoxicated. He admitted to drinking a "few beers." He reported that he has been sleeping outside because he does not want to be at his daughter's or his mother's. He insists that this does not bother him. He planned to  go to his daughter's to shower after out meeting. Mr. Hyde verbalizes wanting to get a job. He reports feeling depressed and at his limit, though continues to use distancing himself from people in order to cope with anger and frustration. He reported to this writer that he is HIV positive and receives [facility]. He said that he has an appointment at 11:30 am there on Wednesday. Writer reminded him that he is supposed to meet with [name] Kuwahara-Birklin for psychopharm that day. He said that [name] could call him to coordinate the meeting. Mr. Nadeem acknowledges suicidal ideation, but says that lets those thoughts go because he wants to be there for his kids. Mr. Peach indicated that he plans to go to [place] to stay with a sibling for the night and that the ACT team can be in touch with him for his psychopharm visit. Mr. Ridley was left in no apparent distress on the benches.    Visit Diagnosis:  No diagnosis found.    Interventions:  Cognitive Processing Therapy, Supportive Psychotherapy    Current Treatment Plan   Created/Updated On 02/21/2016   Next Treatment Plan Due 08/22/2016         Plan:  Psychotherapy continues as described in care plan; plan remains the same.    NEXT APPT: Wednesday 8/30       , LMSW

## 2016-05-13 NOTE — Telephone Encounter (Signed)
Derek Walker calling to request prescription(s) Atripla to be sent to [facility], located at [address] [place].    Is patient out of the medication? yes  Does the patient have questions regarding the medication for the nurse? no    Patient can be reached if necessary at [phone] (M).  Patient's last monitor was   Lab Results   Component Value Date    HIV NEG 10/11/2015     Patient's upcoming appointment is   Future Appointments       Provider Department Center    05/15/2016 11:30 AM Shan LevansZanoria, Catherine, MD [facility]

## 2016-05-15 ENCOUNTER — Encounter: Payer: Self-pay | Admitting: Psychiatry

## 2016-05-15 ENCOUNTER — Ambulatory Visit: Payer: Self-pay | Admitting: Infectious Diseases

## 2016-05-15 ENCOUNTER — Ambulatory Visit: Payer: Self-pay | Admitting: Psychiatry

## 2016-05-15 NOTE — Progress Notes (Signed)
[facility] MISSED/CANCELLED APPOINTMENT     Name: Derek NielsenRALPH D Walker  MRN: [id number]   DOB: [birth year]    Date of Scheduled Service: 05/15/2016     Writer called [name]'s home and his mother answered the phone. She reported that the ID clinic called this morning to cancel today's patient's appointment. Mother reported that patient was at his girlfriend's home therefore, she called and spoke to him to inform him of his cancelled appointment.   Writer was supposed to meet patient today at 10:30AM to take him to his appointment.     Both mother and [name] called the patient at his girlfriend's number, but there was no answer. Writer also drove to the [employer] [facility] where we were scheduled to meet; however, he was not there.         Additional Information:    Not applicable

## 2016-05-16 ENCOUNTER — Ambulatory Visit: Payer: Self-pay

## 2016-05-17 ENCOUNTER — Encounter: Payer: Self-pay | Admitting: Infectious Diseases

## 2016-05-17 ENCOUNTER — Ambulatory Visit: Payer: Self-pay | Admitting: Infectious Diseases

## 2016-05-17 ENCOUNTER — Telehealth: Payer: Self-pay | Admitting: Infectious Diseases

## 2016-05-17 VITALS — BP 104/70 | HR 103 | Temp 99.3°F | Resp 16 | Ht 71.0 in | Wt 155.9 lb

## 2016-05-17 DIAGNOSIS — Z131 Encounter for screening for diabetes mellitus: Secondary | ICD-10-CM

## 2016-05-17 DIAGNOSIS — Z0000 Encounter for general adult medical examination without abnormal findings: Secondary | ICD-10-CM

## 2016-05-17 DIAGNOSIS — Z13220 Encounter for screening for lipoid disorders: Secondary | ICD-10-CM

## 2016-05-17 DIAGNOSIS — B20 Human immunodeficiency virus [HIV] disease: Secondary | ICD-10-CM

## 2016-05-17 DIAGNOSIS — Z23 Encounter for immunization: Secondary | ICD-10-CM

## 2016-05-17 DIAGNOSIS — Z79899 Other long term (current) drug therapy: Secondary | ICD-10-CM

## 2016-05-17 LAB — COMPREHENSIVE METABOLIC PANEL
Albumin: 4.3 g/dL (ref 3.5–5.2)
Anion Gap: 19 — ABNORMAL HIGH (ref 7–16)
Bilirubin,Total: 0.4 mg/dL (ref 0.0–1.2)
CO2: 20 mmol/L (ref 20–28)
Calcium: 9.6 mg/dL (ref 9.0–10.3)
Chloride: 99 mmol/L (ref 96–108)
Creatinine: 0.68 mg/dL (ref 0.67–1.17)
GFR,Black: 138 *
GFR,Caucasian: 119 *
Glucose: 72 mg/dL (ref 60–99)
Lab: 4 mg/dL — ABNORMAL LOW (ref 6–20)
Sodium: 138 mmol/L (ref 133–145)
Total Protein: 7.7 g/dL (ref 6.3–7.7)

## 2016-05-17 LAB — URINALYSIS REFLEX TO CULTURE
Blood,UA: NEGATIVE
Ketones, UA: NEGATIVE
Leuk Esterase,UA: NEGATIVE
Nitrite,UA: NEGATIVE
Protein,UA: NEGATIVE mg/dL
Specific Gravity,UA: 1.01 (ref 1.002–1.030)
pH,UA: 6 (ref 5.0–8.0)

## 2016-05-17 LAB — LYMPHOCYTE SUBSET (T CELLS ONLY)
CD4#: 264 cells/uL — ABNORMAL LOW (ref 496–2186)
CD4%: 22 % — ABNORMAL LOW (ref 32–71)
CD4/CD8: 0.4 — ABNORMAL LOW (ref 0.7–3.0)
CD8#: 588 cells/uL (ref 177–1137)
T LYM #(CD3): 907 cells/uL (ref 754–2810)
T LYM %(CD3): 76 % (ref 54–87)
T LYM# (CD3+4+8+): 10 cells/uL
T LYM# (CD3+4-8-): 68 cells/uL
T LYM% (CD3+4+8+): 1 %
T LYM% (CD3+4-8-): 6 %
T Suppress %(CD8): 49 % — ABNORMAL HIGH (ref 10–38)

## 2016-05-17 LAB — LIPID PANEL
Chol/HDL Ratio: 1.9
Cholesterol: 197 mg/dL
HDL: 102 mg/dL
LDL Calculated: 82 mg/dL
Non HDL Cholesterol: 95 mg/dL
Triglycerides: 65 mg/dL

## 2016-05-17 LAB — CBC AND DIFFERENTIAL
Baso # K/uL: 0.1 10*3/uL (ref 0.0–0.1)
Basophil %: 0.6 %
Eos # K/uL: 0.1 10*3/uL (ref 0.0–0.5)
Eosinophil %: 1.4 %
Hematocrit: 46 % (ref 40–51)
Hemoglobin: 15.8 g/dL (ref 13.7–17.5)
IMM Granulocytes #: 0 10*3/uL (ref 0.0–0.1)
IMM Granulocytes: 0.4 %
Lymph # K/uL: 1.1 10*3/uL — ABNORMAL LOW (ref 1.3–3.6)
Lymphocyte %: 13.3 %
MCH: 34 pg/cell — ABNORMAL HIGH (ref 26–32)
MCHC: 34 g/dL (ref 32–37)
MCV: 100 fL — ABNORMAL HIGH (ref 79–92)
Mono # K/uL: 0.6 10*3/uL (ref 0.3–0.8)
Monocyte %: 7.4 %
Neut # K/uL: 6.6 10*3/uL — ABNORMAL HIGH (ref 1.8–5.4)
Nucl RBC # K/uL: 0 10*3/uL (ref 0.0–0.0)
Nucl RBC %: 0.1 /100 WBC (ref 0.0–0.2)
RBC: 4.6 MIL/uL (ref 4.6–6.1)
RDW: 11.7 % (ref 11.6–14.4)
Seg Neut %: 76.9 %
WBC: 8.5 10*3/uL (ref 4.2–9.1)

## 2016-05-17 LAB — PAIN CLINIC PROFILE
Amphetamine,UR: NEGATIVE
Benzodiazepinen,UR: NEGATIVE
Cocaine/Metab,UR: NEGATIVE
Opiates,UR: NEGATIVE
Oxycodone/Oxymorphone,UR: NEGATIVE
THC Metabolite,UR: POSITIVE

## 2016-05-17 LAB — HEMOGLOBIN A1C: Hemoglobin A1C: 5.2 % (ref 4.0–6.0)

## 2016-05-17 LAB — RESOLUTION

## 2016-05-17 MED ORDER — EFAVIRENZ-EMTRICITAB-TENOFOVIR 600-200-300 MG PO TABS *I*
1.0000 | ORAL_TABLET | Freq: Every evening | ORAL | 1 refills | Status: DC
Start: 2016-05-17 — End: 2016-08-22

## 2016-05-17 MED ORDER — EFAVIRENZ-EMTRICITAB-TENOFOVIR 600-200-300 MG PO TABS *I*
1.0000 | ORAL_TABLET | Freq: Every evening | ORAL | 0 refills | Status: DC
Start: 2016-05-17 — End: 2016-05-17

## 2016-05-17 MED ORDER — DRONABINOL 5 MG PO CAPS *I*
5.0000 mg | ORAL_CAPSULE | Freq: Two times a day (BID) | ORAL | 5 refills | Status: DC
Start: 2016-05-17 — End: 2017-02-06

## 2016-05-17 NOTE — Progress Notes (Signed)
[facility] MISSED/CANCELLED APPOINTMENT     Name: Derek NielsenRALPH D Dolberry  MRN: [id number]   DOB: 10/18/1975    Date of Scheduled Service: 05/16/2016    [name] was a no show for today's appointment.  I have left the patient a message to contact me.    Additional Information:    Writer called and waited 30 minutes for [name] at the [employer] [facility] we have been meeting. No response and was not present.

## 2016-05-17 NOTE — Progress Notes (Signed)
See chart note documented in provider's other encounter with patient today.

## 2016-05-17 NOTE — Telephone Encounter (Signed)
Melanie from [facility] called to inform that the patient's CBC with Differential was not run due to platelet clumping.

## 2016-05-17 NOTE — Addendum Note (Signed)
Addended by: Jenna LuoFARROW, Zamya Culhane M on: 05/17/2016 05:03 PM     Modules accepted: Orders

## 2016-05-17 NOTE — Telephone Encounter (Signed)
ok 

## 2016-05-17 NOTE — Progress Notes (Signed)
Subjective    Derek Walker is a 40 y.o. old Male who presents to the [facility] for follow-up care for HIV disease.    HPI:  HIV: Currently on HAART: Yes (see medication list below for complete list of medications). Per Self report adherence is> 95% Yes. Missed doses: Past 4 days? Yes.  Past 4 weeks? No. Barriers to adherence: No. Missed once dose because RX ran out Wednesday evening, missed last night. Plans to pick up medication today.    He was diagnosed in 2013 from heterosexual contact after he had oral thrush. He has been taking Atripla since diagnosis. Previously he was on Bactrim for prophylaxis, but off since 2014 since CD4 % is >14%    Any visits to ED/Specialists since last clinic visit? No    Review of Systems   Constitutional: Negative for chills, diaphoresis, fever, malaise/fatigue and weight loss.   HENT: Negative.    Eyes: Negative.    Respiratory: Negative.    Cardiovascular: Negative.    Gastrointestinal: Negative.    Genitourinary: Negative.    Musculoskeletal: Negative.    Skin: Positive for rash (states he has eczema at baseline. He states his current symptoms are at baseline with using topical Steroid, states he had best response to ammonium lactate cream which is no longer covered by insurance. ). Negative for itching.   Neurological: Positive for tingling. Negative for dizziness, tremors, sensory change, speech change, focal weakness, seizures and weakness.   Endo/Heme/Allergies: Negative.    Psychiatric/Behavioral: Positive for substance abuse. Negative for depression, hallucinations and suicidal ideas. The patient is not nervous/anxious and does not have insomnia.        Allergies reviewed and updated today: Review of patient's allergies indicates no known allergies (drug, envir, food or latex).    Medications reviewed and updated today including OTC and Herbal Medications:   Outpatient Prescriptions Marked as Taking for the 05/17/16 encounter (Office Visit) with Renato Gails, NP   Medication Sig Dispense Refill    efavirenz-emtrictabine-tenofovir (ATRIPLA) 600-200-300 MG per tablet Take 1 tablet by mouth nightly on an empty stomach 30 tablet 0    [DISCONTINUED] efavirenz-emtrictabine-tenofovir (ATRIPLA) 600-200-300 MG per tablet Take 1 tablet by mouth nightly on an empty stomach 30 tablet 2    baclofen (LIORESAL) 10 MG tablet Take 1 tablet (10 mg total) by mouth 2 times daily as needed for Muscle spasms 60 tablet 1     Medications marked discontinued were active as of onset of Today's encounter.  Due to system issues, they may have been marked discontinued when renewed    Social History     Social History    Marital status: Divorced     Spouse name: N/A    Number of children: N/A    Years of education: N/A     Occupational History    Not on file.     Social History Main Topics    Smoking status: Current Every Day Smoker     Packs/day: 0.50     Years: 25.00     Types: Cigarettes     Start date: 59    Smokeless tobacco: Never Used      Comment: 5 cigarettes/day    Alcohol use 3.6 oz/week     6 Cans of beer per week    Drug use: Yes     Special: Marijuana    Sexual activity: Not Currently     Partners: Female     Birth  control/ protection: None     Social History Narrative    Has 3 adult daughters and one young daughter (born ~2010) who lives with her mother in [place].      Possibly looking to go to [employer] to study car mechanics as of Sept 2017.        Number of sexual partners in the past 6 months - 0  Partner(s) knowledge of patient's HIV status - not applicable  Partner(s)' HIV status -  N/a  Partner's history of HIV testing - N/A  Using condoms - n/a      Objective :     Physical Exam   Constitutional: He is oriented to person, place, and time. He appears well-developed and well-nourished. No distress.   HENT:   Head: Normocephalic and atraumatic.   Right Ear: External ear normal.   Left Ear: External ear normal.   Mouth/Throat: Oropharynx is clear and moist.  No oropharyngeal exudate.   Eyes: Right eye exhibits no discharge. Left eye exhibits no discharge. No scleral icterus.   Neck: Normal range of motion. No tracheal deviation present. No thyromegaly present.   Cardiovascular: Normal rate, regular rhythm and normal heart sounds.  Exam reveals no gallop and no friction rub.    No murmur heard.  Pulmonary/Chest: Effort normal and breath sounds normal. No stridor. No respiratory distress. He has no wheezes. He has no rales.   Abdominal: Soft. Bowel sounds are normal. He exhibits no distension and no mass. There is no tenderness. There is no rebound and no guarding.   Musculoskeletal: Normal range of motion. He exhibits no edema or deformity.   Lymphadenopathy:     He has no cervical adenopathy.   Neurological: He is alert and oriented to person, place, and time.   Skin: Skin is warm and dry. Rash (areas of hyperpigented skin along bilater lower extremeties. Mild scaling. ) noted. He is not diaphoretic. No erythema.   Vitals reviewed.      Vitals:  Vitals:    05/17/16 1041   BP: 104/70   Pulse: 103   Resp: 16   Temp: 37.4 C (99.3 F)   TempSrc: Temporal   SpO2: 98%   Weight: 70.7 kg (155 lb 13.8 oz)   Height: 1.803 m (5' 11")     Body mass index is 21.74 kg/(m^2).  Pain    05/17/16 1041   PainSc:   0 - No pain     Results:  Results:  CD4 -   Lab Results   Component Value Date    CD4A 264 (L) 05/17/2016    CD4A 181 (L) 05/31/2015    CD4A 186 (L) 03/03/2015     Viral load -   Lab Results   Component Value Date    HIV NEG 10/11/2015    HIV NEG 05/31/2015    HIV NEG 03/03/2015         Assessment/Plan :   1. HIV: Here for follow-up for HIV disease   ART: On ART consisting of :Atripla. Stable, continue current antiretroviral therapy; but will send HLA [id number] to see if patient can be moved to Triumeq which poses less risk of CNS side effects   Safer sexual practices were discussed with the patient: Yes   CBC, CD4, viral load, CMP   Medication adherence counseling was  performed : Yes.    No need for OI prophylaxis at current T-cell and percentage.     2. Health maintenance:   Vaccinations: Vaccination(s) was/were  given at today's appointment, see immunization administration record HBV #1 and HAV #1 today. Needs HBV #2 at next visit.    Dental: Last seen 8 months ago, seen at [facility]   Opthalmology:  [facility], last seen about 10 months ago.    Lipid screen: fasting panel done today 05/17/16, result pending   A1C done today 05/17/16, result pending   STD screen: Declines need for GC/CT screening, Syphilis screen sent 05/17/16, result pending   UA WNL 05/17/16   TB Ag Stim test done today 05/17/16    3. Healthy Lifestyle Counseling:    The patient is advised to quit smoking, begin progressive daily aerobic exercise program, follow a low fat, low cholesterol diet, attempt to lose weight, decrease or avoid alcohol intake, continue current medications, continue current healthy lifestyle patterns and return for routine annual checkups.    4. Hx of PTSD, substance abuse, anxiety with depression: Continue medications and care per mental health. Urine drug screen appropriate today 05/17/16    5. Tobacco dependence: Three plus minutes of smoking cessation counseling done.   Risks of smoking reviewed including possible lung disease (COPD, cancer) and cardiac disease.  Options of cessation aides reviewed including nicotine patch, hypnosis, and possible prescription medications.  Patient encouraged to follow-up with outpatient provider.    6. Decreased appetite/nausea/unintentional weight loss: States he has had appetite issues which have resulted in unintentional #10 weight loss last year and another #10 this year (reviewed record to confirm). States he has been on Marinol in the past prescribed at [name]. Asking if he can be on this again (states he does use marijuana once per week and notices it does stimulate appetite and relieve nausea-- but that he is unable to  afford using this regularly). Counseled we can trial 29m BID and see if this helps improve his weight. Advised patient if his drug screen is positive for any substances other than THC we will need to discontinue this; patient verbalized understanding.     7. Hx of eczema: Continue topicals per PCP.     Return to clinic:in 3 month(s) or sooner if needed.    KRenato Gails NP 05/17/2016 3:43 PM

## 2016-05-18 LAB — SYPHILIS SCREEN
Syphilis Screen: NEGATIVE
Syphilis Status: NONREACTIVE

## 2016-05-21 ENCOUNTER — Ambulatory Visit: Payer: Self-pay | Admitting: Psychiatry

## 2016-05-21 ENCOUNTER — Encounter: Payer: Self-pay | Admitting: Psychiatry

## 2016-05-21 LAB — THC SEMI-QUANT, URINE
Creatinine,UR: 92 mg/dL (ref 20–300)
Semi-Quant THC,UR: 220 ng/mL
THC/Creat Ratio: 239 ng/mg

## 2016-05-21 LAB — HIV VIRAL LOAD
HIV-1 PCR log: NEGATIVE copies/mL
HIV-1 PCR: NEGATIVE copies/mL

## 2016-05-21 LAB — CONFIRM THC METABOLITE, URINE: Confirm THC Metab: POSITIVE

## 2016-05-21 NOTE — Progress Notes (Signed)
I attempted a visit for psychopharmacology evaluation today but patient was not home.  F/U per ACT.

## 2016-05-22 ENCOUNTER — Ambulatory Visit: Payer: Self-pay

## 2016-05-23 LAB — TB AG T-CELL STIMULATION: TB Ag T-Cell Stimulation: 0

## 2016-05-26 NOTE — Progress Notes (Signed)
[facility] MISSED/CANCELLED APPOINTMENT     Name: Derek Walker  MRN: [id number]   DOB: May 09, 1976    Date of Scheduled Service: 05/22/16    Derek Walker was a no show for today's appointment.  I have attempted to meet client at the Library for our scheduled visit, however, client never showed and his cell phone is not accepting calls.  Writer therefore contacted his home number and was informed by his mother that he was with his girlfriend, getting the children settled with school.  She will give client a message to Regulatory affairs officercall writer and or Clinical research associatemeet writer on Monday for our scheduled visit.      Additional Information:    Not applicable

## 2016-05-27 ENCOUNTER — Ambulatory Visit: Payer: Self-pay

## 2016-05-27 DIAGNOSIS — F333 Major depressive disorder, recurrent, severe with psychotic symptoms: Secondary | ICD-10-CM

## 2016-05-28 ENCOUNTER — Ambulatory Visit: Payer: Self-pay

## 2016-05-28 DIAGNOSIS — F333 Major depressive disorder, recurrent, severe with psychotic symptoms: Secondary | ICD-10-CM

## 2016-05-30 NOTE — Progress Notes (Signed)
Behavioral Health Progress Note     LENGTH OF SESSION: 30 minutes    Contact Type:  Location: Off Site    Face to Face     Problem(s)/Goals Addressed from Treatment Plan:    Problem 1:   Treatment Problem #1 02/21/2016   Patient Identified Problem unstable housing       Goal for this problem:    Treatment Goal #1 02/21/2016   Patient Identified Goal "I will get my own apartment asap".         Progress towards this goal: Derek Walker met with Probation officer at [employer] and reported feeling quite frustrated due to a number of stressors.      Mental Status Exam:  APPEARANCE: Casual  ATTITUDE TOWARD INTERVIEWER: Cooperative  MOTOR ACTIVITY: WNL (within normal limits)  EYE CONTACT: Direct  SPEECH: Normal rate and tone  AFFECT: Appropriate  MOOD: Frustrated  THOUGHT PROCESS: Normal  THOUGHT CONTENT: No unusual themes  PERCEPTION: Within normal limits  CURRENT SUICIDAL IDEATION: patient denies  CURRENT HOMICIDAL IDEATION: Patient denies  ORIENTATION: Alert and Oriented X 3.  CONCENTRATION: WNL  MEMORY:   Recent: intact   Remote: intact  COGNITIVE FUNCTION: Average intelligence  JUDGMENT: Intact  IMPULSE CONTROL: Intact  INSIGHT: Fair    Risk Assessment:  ASSESSMENT OF RISK FOR SUICIDAL BEHAVIOR  Changes in risk for suicide from baseline Formulation of Risk and/or previous intake, including newly identified risk, if any: none      Session Content::  Writer met with Derek Walker at the [employer] per his preference and he reported that things were continuing to "pile up on him" including the fact that he received a letter advising that he has to appear in [place] court on Sept 18, 2017 @ 3:00pm due to the fact that he failed to complete the 24 hours of community service that he was required to do.  He has the pending warrant out for his arrest and reports that although he discussed a plan to turn himself in last month he failed to do so and therefore this weekend the police presented to his mother's house again looking for him and  therefore, he plans to turn himself in tomorrow evening as "I want to take care of this before I go to court on the 18th". Derek Walker further reports having physical pain in his hands and neck for unknown reasons.  He acknowledged that [facility] has [facility] with [facility], next appointment 11/7.  He remains sanctioned by DSS and therefore has not attended his [place] group for the past week.      He notes continuing to be easily frustrated and notes he has been avoiding confrontations before things "escalate"    He notes that he did go to [facility] to pick up medications and was advised that he does not have any orders on file.  Writer to follow up with ACT nurse.    Client in need of thermals for jail as he reports that it is "very cold in jail".  He continues to need clothing and is currently wearing the same outfit that writer has seen him in on each contact and today his white shirt is more soiled than usual.  Writer to attempt to secure him a thermal and meet client on tomorrow.  He advises that he will have his family Secondary school teacher to advise of his status upon presenting to jail.    Visit Diagnosis:  ICD-10-CM ICD-9-CM   1. Major psychotic depression, recurrent F33.3 296.34       Interventions:  Supportive Psychotherapy    Current Treatment Plan   Created/Updated On 02/21/2016   Next Treatment Plan Due 08/22/2016         Plan:  Psychotherapy continues as described in care plan; plan remains the same.    NEXT APPT: 05/28/16      Maryjane Hurter, LCSW

## 2016-06-03 ENCOUNTER — Ambulatory Visit: Payer: Self-pay | Admitting: Psychiatry

## 2016-06-03 ENCOUNTER — Telehealth: Payer: Self-pay

## 2016-06-03 NOTE — Telephone Encounter (Signed)
[facility] ACT Team Telephone Call     Date of call: [phone] at 1706    Writer advised by ACT Team Staff [name] in am meeting that pt has recently advised her of difficulty in obtaining his medications at the pharmacy of his choice.  Writer offered to follow up on same.     [facility] at this time via [phone] to inquire when pt last picked up medications and if any are ready/due to be picked up.     Staff relate the following details:    Marinol - last picked up on 090517  Gabapentin - last picked up on 082217  Remeron - last picked up on 071417  Zyprexa - last picked up on 081817  Prazosin - last picked up on 071417    Pharmacy staff further relate that zyprexa will be ready for pick up in the 20th.      Writer to advise ACT Team Cindi Carbon([name] and Provider staff) in am meeting.     Laroy AppleLisa A Reyaansh Merlo, RN

## 2016-06-03 NOTE — Progress Notes (Signed)
[facility] MISSED/CANCELLED APPOINTMENT     Name: Derek NielsenRALPH D Walker  MRN: [id number]   DOB: 1976/07/07    Date of Scheduled Service: 06/03/16    Mr. Sohn was a no show for today's appointment.  Team attempted to reach patient with the contact information provided to the team (home and cell numbers). Patient was not available at either number, but team was able to get in touch with his mother who reported that the patient was not at home but should be reachable on his cell phone (as she spoke with him at that number a matter of hours prior). Team attempted to locate patient at home, but he was not home when team arrived.    Additional Information:    Routine ACT follow-up

## 2016-06-05 ENCOUNTER — Ambulatory Visit: Payer: Self-pay

## 2016-06-05 LAB — HLA B*5701 ANTIGEN
HLA-B*57:01: NEGATIVE
HLA-B*57:08: NEGATIVE
HLA-B*57:10: NEGATIVE

## 2016-06-05 NOTE — Progress Notes (Signed)
[facility] MISSED/CANCELLED APPOINTMENT     Name: Derek NielsenRALPH D Biddy  MRN: [id number]   DOB: July 28, 1976    Date of Scheduled Service: 06/05/16    Mr. [name] was a no show for today's appointment.  I have attempted to locate client at the [facility] and at his residence without success.  Client's uncle advised that he has not seen [name] in a couple of days.    Additional Information:    [name] was to present to [place] court on 9/18 and therefore checked the jail census/rouster but did not see client's name on either list.

## 2016-06-06 LAB — HLA A57 REVIEW

## 2016-06-09 NOTE — Progress Notes (Signed)
Behavioral Health Progress Note     LENGTH OF SESSION: 15 minutes    Contact Type:  Location: Off Site    Face to Face     Problem(s)/Goals Addressed from Treatment Plan:    Problem 1:   Treatment Problem #1 02/21/2016   Patient Identified Problem unstable housing       Goal for this problem:    Treatment Goal #1 02/21/2016   Patient Identified Goal "I will get my own apartment asap".         Progress towards this goal: Derek Walker met with Probation officer at [employer] this day and continued to report plans to turn himself in to jail tonight due to the outstnding warrant for his arrest.      Mental Status Exam:  APPEARANCE: Casual  ATTITUDE TOWARD INTERVIEWER: Cooperative  MOTOR ACTIVITY: WNL (within normal limits)  EYE CONTACT: Direct  SPEECH: Normal rate and tone  AFFECT: Full Range  MOOD: Neutral  THOUGHT PROCESS: Normal  THOUGHT CONTENT: No unusual themes  PERCEPTION: Within normal limits  CURRENT SUICIDAL IDEATION: patient denies  CURRENT HOMICIDAL IDEATION: Patient denies  ORIENTATION: Alert and Oriented X 3.  CONCENTRATION: WNL  MEMORY:   Recent: intact   Remote: intact  COGNITIVE FUNCTION: Average intelligence  JUDGMENT: Intact  IMPULSE CONTROL: Intact  INSIGHT: Fair    Risk Assessment:  ASSESSMENT OF RISK FOR SUICIDAL BEHAVIOR  Changes in risk for suicide from baseline Formulation of Risk and/or previous intake, including newly identified risk, if any: none      Session Content::  Writer met Derek Walker at the [employer] as agreed upon on yesterday.  He was dressed in the same wardrobe as yesterday and was grateful for the t-shirt and thermal ware that writer presented him with, per his request.  He continues to report that he haas plans to turn himself in this evening, to jail due to the outstanding warrant for his arrest.  He reports that he has spoken to his family and they are prepared and he further notes that he will have someone Secondary school teacher to advise of his status.  He is hopeful that he will be out by the  18th in order to make his court appearance at [employer] as scheduled for violation of not completing his community service work.  There are no further concerns at this time.     Visit Diagnosis:    ICD-10-CM ICD-9-CM   1. Major psychotic depression, recurrent F33.3 296.34       Interventions:  Reflective Listening    Current Treatment Plan   Created/Updated On 02/21/2016   Next Treatment Plan Due 08/22/2016         Plan:  Psychotherapy continues as described in care plan; plan remains the same.    NEXT APPT: 05/29/16, pending call      Maryjane Hurter, LCSW

## 2016-06-10 ENCOUNTER — Ambulatory Visit: Payer: Self-pay | Admitting: Psychiatry

## 2016-06-12 ENCOUNTER — Ambulatory Visit: Payer: Self-pay

## 2016-06-13 NOTE — Progress Notes (Signed)
[facility] MISSED/CANCELLED APPOINTMENT     Name: Derek Walker  MRN: [id number]   DOB: 17-Apr-1976    Date of Scheduled Service: 06/12/16    [name] was a no show for today's appointment.  I have attempted a community visit with client, however, [facility].    Additional Information:    Not applicable

## 2016-06-17 ENCOUNTER — Ambulatory Visit: Payer: Self-pay

## 2016-06-17 DIAGNOSIS — F333 Major depressive disorder, recurrent, severe with psychotic symptoms: Secondary | ICD-10-CM
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
## 2016-06-19 ENCOUNTER — Ambulatory Visit: Payer: Self-pay

## 2016-06-19 DIAGNOSIS — F333 Major depressive disorder, recurrent, severe with psychotic symptoms: Secondary | ICD-10-CM

## 2016-06-19 NOTE — Progress Notes (Signed)
[facility] MISSED/CANCELLED APPOINTMENT     Name: Derek NielsenRALPH D Walker  MRN: [id number]   DOB: 1975/10/10    Date of Scheduled Service: 06/19/16    Mr. Esteban was a no show for today's appointment.  I have attempted a home visit and calls to client without ability to locate him, however, he did call writer back later in the day to advise that he had to go to the hospital with his 40 yo son who was having a panic attack.  Client agreeable to meeting with writer tomorrow in the event that writer's schedule permits. Writer to contact him after my morning meeting to advise.    Additional Information:    Not applicable

## 2016-06-20 ENCOUNTER — Ambulatory Visit: Payer: Self-pay

## 2016-06-20 NOTE — Progress Notes (Signed)
[facility] MISSED/CANCELLED APPOINTMENT     Name: Derek NielsenRALPH D Walker  MRN: [id number]   DOB: [birth year]    Date of Scheduled Service: 06/20/16    [name] was a no show for today's appointment.  I have attempted to call [name] to schedule a time to meet today, however, he did not answer and vm has not been set up.  Per his pattern, he did not return a call to [name].      Additional Information:    Not applicable

## 2016-06-23 NOTE — Progress Notes (Signed)
[facility] Team Telephone Call      Date of Call: [phone] at 1521     Writer advised by ACT Team Staff [name] in am meeting that Derek Walker has been expressing difficulty in obtaining his medication refills. Please see writer note of [phone] regarding a similar conversation.     Writer call to [facility] via [phone] to inquire from staff pt recent pick up of medications and status of same. Staff advise of the following details, per medication:    Marinol - last picked up on 09/18 *previously advised picked up on 09/05  Gabapentin - last picked up on 08/31 *previously advised picked up on 8/31  Remeron - last picked up on 07/14 (consistent pick up date from previous inquiry)  Zyprexa - last picked up on 0821 *previously advised last picked up on 08/21  Prazosin - last picked up on 07/14 (consistent pick up date from previous inquiry)    Writer requested that all available refills be filled and pharmacy staff state will do so. Pharmacy staff advise that pt has not called for refills.     Advised [name] of the above and offered to assist pt with transfer of refills to [facility] moving forward in order to assist with timely refills until pt able to independently manage, if this would be of assistance to him as well as assist with symptom reduction/increased medication adherence.     [name], RN

## 2016-06-24 ENCOUNTER — Ambulatory Visit: Payer: Self-pay | Admitting: Psychiatry

## 2016-06-24 DIAGNOSIS — F329 Major depressive disorder, single episode, unspecified: Secondary | ICD-10-CM

## 2016-06-24 DIAGNOSIS — F4310 Post-traumatic stress disorder, unspecified: Secondary | ICD-10-CM

## 2016-06-24 DIAGNOSIS — F419 Anxiety disorder, unspecified: Secondary | ICD-10-CM

## 2016-06-24 DIAGNOSIS — F1220 Cannabis dependence, uncomplicated: Secondary | ICD-10-CM

## 2016-06-24 NOTE — Progress Notes (Signed)
ACT team attempted a home visit for psychopharmacology evaluation today but patient was not home. Collateral was obtained from a family member who reported that patient was not home. Stated that patient was doing well. F/U per ACT.

## 2016-06-26 ENCOUNTER — Ambulatory Visit: Payer: Self-pay

## 2016-06-27 DIAGNOSIS — F333 Major depressive disorder, recurrent, severe with psychotic symptoms: Secondary | ICD-10-CM

## 2016-06-27 DIAGNOSIS — F1010 Alcohol abuse, uncomplicated: Secondary | ICD-10-CM

## 2016-06-27 DIAGNOSIS — F4310 Post-traumatic stress disorder, unspecified: Secondary | ICD-10-CM

## 2016-06-27 DIAGNOSIS — F1220 Cannabis dependence, uncomplicated: Secondary | ICD-10-CM

## 2016-06-27 NOTE — Progress Notes (Signed)
[facility] Psychosocial Update     Psychosocial Update (for the preceding 6 months)  06/27/2016    Derek Walker is individually seen one to two times per week  for 15 - 60 minutes.      Type of visit: psychopharm, nursing/health education, medication support, care coordination, mental health counseling targeting person centered goals and objectives     Focus: Stabilization of affective symptoms to promote successful community living and reduce frequency and intensity of acute episodes, recovery oriented activities, medical care, IDDT consult      Current Medications  Current Outpatient Prescriptions   Medication Sig    dronabinol (MARINOL) 5 MG capsule Take 1 capsule (5 mg total) by mouth 2 times daily (before meals)   Max daily dose: 10 mg    efavirenz-emtrictabine-tenofovir (ATRIPLA) 600-200-300 MG per tablet Take 1 tablet by mouth nightly on an empty stomach    mirtazapine (REMERON) 15 MG tablet Take 1 tablet (15 mg total) by mouth nightly    prazosin (MINIPRESS) 1 MG capsule Take 1 capsule (1 mg total) by mouth nightly   for nightmares    OLANZapine (ZYPREXA) 5 MG tablet Take 1 tablet by mouth twice daily for 5 days, then take 1 tablet by mouth in the morning & take 2 tablets by mouth nightly.    gabapentin (NEURONTIN) 100MG  capsule Take 1 capsule by mouth 3 times daily for 3 days, then take 2 capsules by mouth 3 times daily.    baclofen (LIORESAL) 10 MG tablet Take 1 tablet (10 mg total) by mouth 2 times daily as needed for Muscle spasms    lidocaine (XYLOCAINE) 5 % ointment Apply topically 3 times daily as needed for Pain (neck stiffness and pain)   Apply to top of right foot and small toes    HYDROcodone-acetaminophen (NORCO) 5-325 MG per tablet Take 1 tablet by mouth every 8 hours as needed for Pain   Max daily dose: 3 tablets    terbinafine (LAMISIL) 1 % cream Apply topically 2 times daily    omeprazole (PRILOSEC) 20 MG capsule Take 1 capsule (20 mg total) by  mouth nightly    nicotine polacrilex (COMMIT) 4 MG lozenge Place 1 lozenge (4 mg total) inside cheek as needed for Smoking cessation   Max daily dose: 20 lozenges No more than 5 pieces in 6hrs.     No current facility-administered medications for this visit.        Psychosocial History and Update  Medical History and Pain Update: Derek Walker has no current pain complaints.  [facility] where he was last seen on 05/17/16 and started on Marinol 5 mg BID to stimulate appetite.     Living Arrangements: Derek Walker lives with his mother and other members of his family. However, due to stressors in the home he often reports staying with his daughter and or staying outside in order to avoid further conflict.      Activities/Programs: No formal activities..     Current symptoms/impairments: Derek Walker complains of feelings of anxiety, depression, outburst of anger, and fleeting repots of homicidal ideation without plan or intent.  He often exhibits low frustration tolerance, avoidance and failure to take responsibility for his actions. In addition, Derek Walker is medication and treatment non adherent and endorses alcohol usage.    Substance Use:    Current use of alcohol or drugs: Yes    Past use of alcohol or drugs (note substances, approximate dates): Yes  Drug treatment history:   Approximate Dates:  2016-2017, Summer 2017   Treatment and Facility:  [name], [address] [facility]   Additional Comments: Derek Walker discontinued [address] secondary to being sanctioned by DSS for 6 months    Mental Status Exam  APPEARANCE: Appears younger than stated age, Casual  ATTITUDE TOWARD INTERVIEWER: Cooperative  MOTOR ACTIVITY: WNL (within normal limits)  EYE CONTACT: Direct  SPEECH: Normal rate and tone  AFFECT: Full Range  MOOD: Normal  THOUGHT PROCESS: Normal  THOUGHT CONTENT: No unusual themes  PERCEPTION: Within normal limits  CURRENT SUICIDAL IDEATION: patient denies  CURRENT HOMICIDAL  IDEATION: Patient denies  ORIENTATION: Alert and Oriented X 3.  CONCENTRATION: WNL  MEMORY:   Recent: intact   Remote: intact  COGNITIVE FUNCTION: Average intelligence  JUDGMENT: Impaired -  moderate  IMPULSE CONTROL: Fair  INSIGHT: Fair    Diagnosis    ICD-10-CM ICD-9-CM   1. 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 PTSD (post-traumatic stress disorder) F43.10 309.81   3. Cannabis use disorder, moderate, dependence F12.20 304.30   4. Alcohol use disorder, mild, abuse F10.10 305.00       Without this treatment, is the patient likely to deteriorate, relapse, or require hospitalization? yes    Assessment  (progress towards goals and objectives)  For   Gains/Advances: Derek Walker advocated for and was started on Marinol by the [facility]. Derek Walker continues to pursue his Therapist, nutritional.    Declines: Derek Walker has had two outstanding legal charges during this period: failure to complete 24 hours of  community service, secondary to a trespassing charge and a pending legal charge for signing for and stealing a package belonging to a neighbor.  Per client he could be facing up to 8 months in jail. In addition, Derek Walker has been sanctioned by DSS for 6 months due to failure to complete his CD treatment.  He is medication non adherent and often reports that he is in need of medications when in fact, the medication have already been placed and awaiting his pick up. However, his pharmacy of choice, reports that he has not reordered medications despite active orders being on file and in-spite of ACT team's ordering the medications for him and or offering to take him to pick up the medications. Derek Walker reportedly picked up his Marinol 9/18 but was unable to pick up meds this month due to losing his ID and efforts for ACT to assist him in getting a new ID has failed due to his failure to keep scheduled appointments. Derek Walker last picked up some of his psychotropic meds as late as July.      Plan  As per  Comprehensive Treatment Plan

## 2016-07-02 DIAGNOSIS — F333 Major depressive disorder, recurrent, severe with psychotic symptoms: Secondary | ICD-10-CM

## 2016-07-02 NOTE — Progress Notes (Addendum)
[facility] ACT Team Clinical Management Note       Per review [facility], Derek Walker is presently incarcerated and unable to be engaged as a result:             Derek AppleLisa A Alayiah Fontes, RN

## 2016-07-02 NOTE — Progress Notes (Signed)
[facility] MISSED/CANCELLED APPOINTMENT     Name: Derek Walker  MRN: [id number]   DOB: 01/01/1976    Date of Scheduled Service: 06/26/16    [name] was a no show for today's appointment.  I have attempted a home visit and was informed by client's uncle that he was not in and he would let client know that I had stopped by.    Additional Information:    Not applicable

## 2016-07-08 ENCOUNTER — Ambulatory Visit: Payer: Self-pay

## 2016-07-08 DIAGNOSIS — F332 Major depressive disorder, recurrent severe without psychotic features: Secondary | ICD-10-CM

## 2016-07-10 NOTE — Progress Notes (Signed)
Behavioral Health Progress Note     LENGTH OF SESSION: 20 minutes    Contact Type:  Location: Off Site    Direct     Problem(s)/Goals Addressed from Treatment Plan:    Problem 1:   Treatment Problem #1 02/21/2016   Patient Identified Problem unstable housing       Goal for this problem:    Treatment Goal #1 02/21/2016   Patient Identified Goal "I will get my own apartment asap".         Progress towards this goal: Problem worsening.  Comment: see below, incarcerated and severe depressive symptoms evident      Mental Status Exam:  APPEARANCE: in jail attire, shiner on right eye  ATTITUDE TOWARD INTERVIEWER: Cooperative  MOTOR ACTIVITY: WNL (within normal limits)  EYE CONTACT: Direct  SPEECH: Normal rate and tone  AFFECT: Depressed  MOOD: Depressed  THOUGHT PROCESS: Intact  THOUGHT CONTENT: No unusual themes  PERCEPTION: No evidence of hallucinations  CURRENT SUICIDAL IDEATION: patient denies  CURRENT HOMICIDAL IDEATION: Patient denies  ORIENTATION: Alert and Oriented X 3.  CONCENTRATION: Fair  MEMORY:   Recent: impaired    Remote: not assessed  COGNITIVE FUNCTION: Average intelligence  JUDGMENT: Impaired -  moderate  IMPULSE CONTROL: Fair  INSIGHT: Fair    Risk Assessment:  ASSESSMENT OF RISK FOR SUICIDAL BEHAVIOR  Changes in risk for suicide from baseline Formulation of Risk and/or previous intake, including newly identified risk, if any: stressful life event- depressed, incarcerated    Violence risk was assessed and No Change noted from baseline formulation of risk and/or previous assessment.  Session Content::  Derek [name] was seen in [facility] for an ACT visit.  Writer has not met with him in several weeks, though he engaged easily.  There was swelling and discoloration around the right eye.  When asked what happened, Derek [name] said he did not remember, that he woke up in jail.  He c/o depressed mood, hypersomnolence, helplessness, anhedonia, sadness and anxiety, poor appetite, wanting to be left alone (no family visits, staying  in his cell) and anergia.  He did not report SI.  Stated he was "moved" to a "detox" unit.  He did not know why, but reported drinking binges prior to jail, and immediately prior to arrest.  Stated he has an order of protection against him by a woman he doesn't know.  He was evasive and/or did not remember what occurred.  He reported his child's mother (with whom he's resided off and on) came to visit, but he walked away, didn't want to see her.  He had no complaints regarding medication, but doesn't think it's particularly helping the depression either.  He reports to be sleeping as much as possible, d/t depression, sedation and just wanting to pass the time away.  He made not mention of psychotic symptoms and was not observed by Probation officer exhibiting them.  Derek [name] is not known to Probation officer since admission to ACT to display psychotic symptoms.  In writer's clinical opinion, functional impairments have consistently related to related to depressed affect, substance use and some character pathology.      Derek [name] reports he will appear in [place] court today for failure to complete 12 hours community service, which he attributed to poor communication b/w ACT therapist and Management consultant, as well as the latter not getting back to [place].  He is not scheduled to appear in city court until 11/28.  States the charges were misdemeanors for which he could get up to  8 months, probably with time served.  Clearly states he'd rather serve out his time than participate in a diversion program such as drug or mental health court.  He may not be eligible anyway.  He signed a release for the public defender's office, which Probation officer gave to primary counselor, Margaree Mackintosh.      [name] reports he was seen by Derek [name], ACT NP who also works part time in the jail.  Writer advised she is with [place] on 10/25 and we will get an update from her.  Writer advised we will most likely, probably writer visit him again in jail in the near future for  follow up.      Visit Diagnosis:      ICD-10-CM ICD-9-CM   1. Severe episode of recurrent major depressive disorder, without psychotic features F33.2 296.33       Interventions:  assessment of mental status, service planning    Current Treatment Plan   Created/Updated On 02/21/2016   Next Treatment Plan Due 08/22/2016         Plan:  Psychotherapy continues as described in care plan; plan remains the same.    NEXT APPT: TBD- see above      Satira Anis, [facility]

## 2016-07-22 ENCOUNTER — Ambulatory Visit: Payer: Self-pay

## 2016-07-22 DIAGNOSIS — F332 Major depressive disorder, recurrent severe without psychotic features: Secondary | ICD-10-CM

## 2016-07-24 ENCOUNTER — Ambulatory Visit: Payer: Self-pay

## 2016-07-24 DIAGNOSIS — F332 Major depressive disorder, recurrent severe without psychotic features: Secondary | ICD-10-CM

## 2016-07-25 ENCOUNTER — Ambulatory Visit: Payer: Self-pay | Admitting: Psychiatry

## 2016-07-26 ENCOUNTER — Ambulatory Visit: Payer: Self-pay | Admitting: Infectious Diseases

## 2016-07-29 ENCOUNTER — Ambulatory Visit: Payer: Self-pay

## 2016-07-29 DIAGNOSIS — F332 Major depressive disorder, recurrent severe without psychotic features: Secondary | ICD-10-CM

## 2016-07-30 ENCOUNTER — Ambulatory Visit: Payer: Self-pay | Admitting: Infectious Diseases

## 2016-07-31 ENCOUNTER — Ambulatory Visit: Payer: Self-pay | Admitting: Psychiatry

## 2016-07-31 ENCOUNTER — Telehealth: Payer: Self-pay

## 2016-07-31 DIAGNOSIS — F333 Major depressive disorder, recurrent, severe with psychotic symptoms: Secondary | ICD-10-CM

## 2016-07-31 MED ORDER — OLANZAPINE 5 MG PO TABS *I*
ORAL_TABLET | ORAL | 2 refills | Status: DC
Start: 2016-07-31 — End: 2016-09-05

## 2016-07-31 MED ORDER — PRAZOSIN HCL 1 MG PO CAPS *I*
1.0000 mg | ORAL_CAPSULE | Freq: Every evening | ORAL | 2 refills | Status: DC
Start: 2016-07-31 — End: 2016-09-05

## 2016-07-31 MED ORDER — RISPERIDONE 0.5 MG PO TABS *I*
0.5000 mg | ORAL_TABLET | Freq: Every day | ORAL | 5 refills | Status: DC
Start: 2016-07-31 — End: 2016-11-28

## 2016-07-31 MED ORDER — MIRTAZAPINE 15 MG PO TABS *I*
15.0000 mg | ORAL_TABLET | Freq: Every evening | ORAL | 1 refills | Status: DC
Start: 2016-07-31 — End: 2016-10-23

## 2016-07-31 MED ORDER — RISPERIDONE 0.5 MG PO TABS *I*
0.5000 mg | ORAL_TABLET | Freq: Every day | ORAL | 5 refills | Status: DC
Start: 2016-07-31 — End: 2016-07-31

## 2016-07-31 NOTE — Telephone Encounter (Signed)
This left VM requesting pt mother Derek Walker requesting pt contact this [name] regarding missed appointment. Gavin Poundhis [name] currently awaiting return call from pt and will assist with rescheduling and addressing any barriers to engaging in care as needed.    [name] N. [name], [place]  Ext: [phone]  Pager: [phone] X [phone]

## 2016-07-31 NOTE — Progress Notes (Signed)
Behavioral Health Psychopharmacology Follow-up     Length of Session: 20 minutes.    Diagnosis Addressed    ICD-10-CM ICD-9-CM   1. 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  Wt Readings from Last 3 Encounters:   05/17/16 70.7 kg (155 lb 13.8 oz)   10/11/15 74.9 kg (165 lb 1.6 oz)   09/21/15 74.4 kg (164 lb)       Recent History and Response to Medications  Pt reports poor PO med compliance and as a result inadequate symptom control.    For recent hx please review the following note by Satira Anis, [facility] at 07/10/2016.    "Risk Assessment:  ASSESSMENT OF RISK FOR SUICIDAL BEHAVIOR  Changes in risk for suicide from baseline Formulation of Risk and/or previous intake, including newly identified risk, if any: stressful life event- depressed, incarcerated     Violence risk was assessed and No Change noted from baseline formulation of risk and/or previous assessment.  Session Content::  Layla was seen in [facility] for an ACT visit.  Writer has not met with him in several weeks, though he engaged easily.  There was swelling and discoloration around the right eye.  When asked what happened, Mildred said he did not remember, that he woke up in jail.  He c/o depressed mood, hypersomnolence, helplessness, anhedonia, sadness and anxiety, poor appetite, wanting to be left alone (no family visits, staying in his cell) and anergia.  He did not report SI.  Stated he was "moved" to a "detox" unit.  He did not know why, but reported drinking binges prior to jail, and immediately prior to arrest.  Stated he has an order of protection against him by a woman he doesn't know.  He was evasive and/or did not remember what occurred.  He reported his child's mother (with whom he's resided off and on) came to visit, but he walked away, didn't want to see her.  He had no complaints regarding medication, but doesn't think  it's particularly helping the depression either.  He reports to be sleeping as much as possible, d/t depression, sedation and just wanting to pass the time away.  He made not mention of psychotic symptoms and was not observed by Probation officer exhibiting them.  Javiel is not known to Probation officer since admission to ACT to display psychotic symptoms.  In writer's clinical opinion, functional impairments have consistently related to related to depressed affect, substance use and some character pathology.       Khaiden reports he will appear in [place] court today for failure to complete 12 hours community service, which he attributed to poor communication b/w ACT therapist and Management consultant, as well as the latter not getting back to [place].  He is not scheduled to appear in city court until 11/28.  States the charges were misdemeanors for which he could get up to 8 months, probably with time served.  Clearly states he'd rather serve out his time than participate in a diversion program such as drug or mental health court.  He may not be eligible anyway.  He signed a release for the public defender's office, which Probation officer gave to primary counselor, Margaree Mackintosh.       Dequincy reports he was seen by Delcie Roch, ACT NP who also works part time in the jail.  Writer advised she is with  [place] on 10/25 and we will get an update from her.  Writer advised we will most likely, probably writer visit him again in jail in the near future for follow up."  Above note by note by Satira Anis, [facility] at 07/10/2016.         Current use of alcohol or drugs: yes reports he drank yesterday, but normally does not drink much and denies other drug use.    ENERGY: Good  SLEEP: Normal.    APPETITE: Good  WEIGHT: Not assessed at this time  SEXUAL FUNCTION: not asked  ENJOYMENT/INTEREST: Fair    Review of Systems:  Pertinent items are noted in HPI.    Current Medications  Current Outpatient Prescriptions   Medication Sig    dronabinol (MARINOL) 5 MG capsule  Take 1 capsule (5 mg total) by mouth 2 times daily (before meals)   Max daily dose: 10 mg    efavirenz-emtrictabine-tenofovir (ATRIPLA) 600-200-300 MG per tablet Take 1 tablet by mouth nightly on an empty stomach    mirtazapine (REMERON) 15 MG tablet Take 1 tablet (15 mg total) by mouth nightly    prazosin (MINIPRESS) 1 MG capsule Take 1 capsule (1 mg total) by mouth nightly   for nightmares    OLANZapine (ZYPREXA) 5 MG tablet Take 1 tablet by mouth twice daily for 5 days, then take 1 tablet by mouth in the morning & take 2 tablets by mouth nightly.    gabapentin (NEURONTIN) '100MG'$  capsule Take 1 capsule by mouth 3 times daily for 3 days, then take 2 capsules by mouth 3 times daily.    baclofen (LIORESAL) 10 MG tablet Take 1 tablet (10 mg total) by mouth 2 times daily as needed for Muscle spasms    lidocaine (XYLOCAINE) 5 % ointment Apply topically 3 times daily as needed for Pain (neck stiffness and pain)   Apply to top of right foot and small toes    HYDROcodone-acetaminophen (NORCO) 5-325 MG per tablet Take 1 tablet by mouth every 8 hours as needed for Pain   Max daily dose: 3 tablets    terbinafine (LAMISIL) 1 % cream Apply topically 2 times daily    omeprazole (PRILOSEC) 20 MG capsule Take 1 capsule (20 mg total) by mouth nightly    nicotine polacrilex (COMMIT) 4 MG lozenge Place 1 lozenge (4 mg total) inside cheek as needed for Smoking cessation   Max daily dose: 20 lozenges No more than 5 pieces in 6hrs.     No current facility-administered medications for this visit.        Side Effects  None    Mental Status  APPEARANCE: Appears stated age  ATTITUDE TOWARD INTERVIEWER: Cooperative  MOTOR ACTIVITY: WNL (within normal limits)  EYE CONTACT: Direct  SPEECH: Normal rate and tone  AFFECT: Full Range  MOOD: Euthymic  THOUGHT PROCESS: Normal  THOUGHT CONTENT: No unusual themes  PERCEPTION: Within normal limits  ORIENTATION: Alert and Oriented X 3.  CONCENTRATION: WNL  MEMORY:   Recent: intact   Remote:  intact  COGNITIVE FUNCTION: Average intelligence  JUDGMENT: Intact  IMPULSE CONTROL: Good  INSIGHT: Fair    Risk Assessment  Self Injury: Patient Denies  Suicidal Ideation: Patient Denies  Homicidal Ideation: Patient Denies  Aggressive Behavior: Patient Denies    If any of the answers above are Yes, is there access to lethal means?   N/A    [place] Scale administered? N/A    Results  All labs in the last 6 months:  Office Visit on 05/17/2016   Component Date Value Ref Range Status    Cholesterol 05/17/2016 197  mg/dL Final    Comment: REFERENCE RANGE:  < 200 Desirable                  200-239 Borderline High                    > 240 High      Triglycerides 05/17/2016 65  mg/dL Final    Comment: REFERENCE RANGE:  < 150 Normal                  150-199 Borderline High                  200-499 High                    > 500 Very High      HDL 05/17/2016 102  mg/dL Final    Comment: REFERENCE RANGE:  < 40 Low                    > 60 High      LDL Calculated 05/17/2016 82  mg/dL Final    Comment: REFERENCE RANGE:  < 100 Optimal                  100-129 Near or above optimal                  130-159 Borderline High                  160-189 High                    > 189 Very High      Non HDL Cholesterol 05/17/2016 95  mg/dL Final    Target is 73m/dl above(or over) LDL goal    Chol/HDL Ratio 05/17/2016 1.9   Final    Hemoglobin A1C 05/17/2016 5.2  4.0 - 6.0 % Final                                  NON-DIABETIC    Syphilis Screen 05/17/2016 Neg   Final    Test Method: CMIA    Syphilis Status 05/17/2016 Nonreact   Final    HIV-1 PCR log 05/17/2016 NEG  copies/mL Final    HIV-1 PCR 05/17/2016 NEG  copies/mL Final    Comment: Lower Limit of Detection: 20 copies/mL  Dynamic Range: 1.3 - 7.0 log copies/mL  Dynamic Range: 20 - 10,000,000 copies/mL    Accurate quantification not possible at < 20 copies/mL.  Specimens in the 1-19 copies/mL range are reported qualitatively  as POS<20.    This specimen was tested using  the Roche COBAS Ampliprep COBAS Taqman HIV-1  version 2.0 test.      WBC 05/17/2016 8.5  4.2 - 9.1 THOU/uL Final    RBC 05/17/2016 4.6  4.6 - 6.1 MIL/uL Final    Hemoglobin 05/17/2016 15.8  13.7 - 17.5 g/dL Final    Hematocrit 05/17/2016 46  40 - 51 % Final    MCV 05/17/2016 100* 79 - 92 fL Final    MCH 05/17/2016 34* 26 - 32 pg/cell Final    MCHC 05/17/2016 34  32 - 37 g/dL Final    RDW 05/17/2016 11.7  11.6 - 14.4 % Final  Platelets 05/17/2016 CANCELED  150 - 330 THOU/uL Final    Comment: Adequate but clumped    Result canceled by the ancillary      Seg Neut % 05/17/2016 76.9  % Final    Lymphocyte % 05/17/2016 13.3  % Final    Monocyte % 05/17/2016 7.4  % Final    Eosinophil % 05/17/2016 1.4  % Final    Basophil % 05/17/2016 0.6  % Final    Neut # K/uL 05/17/2016 6.6* 1.8 - 5.4 THOU/uL Final    Lymph # K/uL 05/17/2016 1.1* 1.3 - 3.6 THOU/uL Final    Mono # K/uL 05/17/2016 0.6  0.3 - 0.8 THOU/uL Final    Eos # K/uL 05/17/2016 0.1  0.0 - 0.5 THOU/uL Final    Baso # K/uL 05/17/2016 0.1  0.0 - 0.1 THOU/uL Final    Nucl RBC % 05/17/2016 0.1  0.0 - 0.2 /100 WBC Final    Nucl RBC # K/uL 05/17/2016 0.0  0.0 - 0.0 THOU/uL Final    IMM Granulocytes # 05/17/2016 0.0  0.0 - 0.1 THOU/uL Final    IMM Granulocytes 05/17/2016 0.4  % Final    Sodium 05/17/2016 138  133 - 145 mmol/L Final    Potassium 05/17/2016 CANCELED  3.3 - 5.1 mmol/L Final    Comment: Hemolyzed    Result canceled by the ancillary      Chloride 05/17/2016 99  96 - 108 mmol/L Final    CO2 05/17/2016 20  20 - 28 mmol/L Final    Anion Gap 05/17/2016 19* 7 - 16 Final    UN 05/17/2016 4* 6 - 20 mg/dL Final    Creatinine 05/17/2016 0.68  0.67 - 1.17 mg/dL Final    GFR,Caucasian 05/17/2016 119  * Final    GFR,Black 05/17/2016 138  * Final    *UNITS=mL/min/1.73 square meters    Glucose 05/17/2016 72  60 - 99 mg/dL Final    Comment: Reference Ranges apply only to FASTING samples.    ADA Guidelines Blood Sugar Levels for Diagnosing  Diabetes & Pre-diabetes  Normal: < 100 mg/dL  Impaired Fasting Glucose (IFG): 100-125 mg/dL  Diabetes:  > 126 mg/dL on two different occasions      Calcium 05/17/2016 9.6  9.0 - 10.3 mg/dL Final    Total Protein 05/17/2016 7.7  6.3 - 7.7 g/dL Final    Albumin 05/17/2016 4.3  3.5 - 5.2 g/dL Final    Bilirubin,Total 05/17/2016 0.4  0.0 - 1.2 mg/dL Final    AST 05/17/2016 CANCELED  0 - 50 U/L Final    Comment: Hemolyzed    Result canceled by the ancillary      ALT 05/17/2016 CANCELED  0 - 50 U/L Final    Comment: Hemolyzed    Result canceled by the ancillary      Alk Phos 05/17/2016 CANCELED  40 - 130 U/L Final    Comment: Hemolyzed    Result canceled by the ancillary      T LYM %(CD3) 05/17/2016 76  54 - 87 % Final    T LYM #(CD3) 05/17/2016 907  754 - 2810 cells/uL Final    CD4% 05/17/2016 22* 32 - 71 % Final    CD4# 05/17/2016 264* 496 - 2186 cells/uL Final    T Suppress %(CD8) 05/17/2016 49* 10 - 38 % Final    CD8# 05/17/2016 588  177 - 1137 cells/uL Final    CD4/CD8 05/17/2016 0.4* 0.7 - 3.0 Final  T LYM% (CD3+4+8+) 05/17/2016 1  % Final    T LYM# (CD3+4+8+) 05/17/2016 10  cells/uL Final    T LYM% (CD3+4-8-) 05/17/2016 6  % Final    T LYM# (CD3+4-8-) 05/17/2016 68  cells/uL Final    Interp,LMH 05/17/2016 see message   Final    Comment: Reference Range Note:  Pediatric reference values (0-16 years) taken from  [facility], Number 3.  Adult ranges developed in-house in October, 2002.      HLA-B*57:01 05/17/2016 NEG   Final    HLA-B*57:10 05/17/2016 NEG   Final    HLA-B*57:08 05/17/2016 NEG   Final    Interp,HLA57 05/17/2016 see text   Final    Comment: Method: Sequence Specific Oligonucleotide (SSO)    This test was developed and its performance characteristics  determined by [facility].  It  has not been cleared or approved by the U.S. Food and Drug  Administration.  The FDA has determined that such clearance  or approval is not necessary  for clinical use of this test.  The results should not be used as a sole diagnostic or  therapeutic criterion.      Reviewed by 05/17/2016 Coppage   Final    TB Ag T-Cell Stimulation 05/17/2016 .   Final    Amphetamine,UR 05/17/2016 NEG   Final    Comment: 1000 ng/mL  Reference Ranges are Screening Cutoff Values.      Cocaine/Metab,UR 05/17/2016 NEG   Final    Comment: 300 ng/mL  Reference Ranges are Screening Cutoff Values.      Opiates,UR 05/17/2016 NEG   Final    Comment: 300 ng/mL  Reference Ranges are Screening Cutoff Values.      Oxycodone/Oxymorphone,UR 05/17/2016 NEG   Final    Comment: 100 ng/mL  Reference Ranges are Screening Cutoff Values.      THC Metabolite,UR 05/17/2016 POS   Final    Comment: 50 ng/mL  PRELIMINARY RESULT ONLY - SEE CONFIRMATION  Reference Ranges are Screening Cutoff Values.      Benzodiazepinen,UR 05/17/2016 NEG   Final    Comment: 200 ng/mL  Reference Ranges are Screening Cutoff Values.      Color, UA 05/17/2016 Yellow  Yellow Final    Appearance,UR 05/17/2016 1+ Cloudy* Clear Final    Specific Gravity,UA 05/17/2016 1.010  1.002 - 1.030 Final    Leuk Esterase,UA 05/17/2016 NEG  NEGATIVE Final    Nitrite,UA 05/17/2016 NEG  NEGATIVE Final    pH,UA 05/17/2016 6.0  5.0 - 8.0 Final    Protein,UA 05/17/2016 NEG  NEGATIVE mg/dL Final    Glucose,UA 05/17/2016 NORM  mg/dL Final    NORMAL    Ketones, UA 05/17/2016 NEG  NEGATIVE Final    Blood,UA 05/17/2016 NEG  NEGATIVE Final    Resolution 05/17/2016 see text   Corrected    Comment: PLATELET (PLT) PORTION OF CBC,PLT/DIFF Test not performed. Unable to  perform testing due to platelet clumping.    K,ALK PHOS,AST,ALT not performed Unable to perform testing due to  hemolysis.  Corrected Report, previous comment on 05/17/2016 at 16:49 [id number] (PLT)  PORTION OF CBC,PLT/DIFF Test not performed. Unable to perform testing due to  platelet clumping.      Confirm THC Metab 05/17/2016 POS   Final    Comment: Marijuana Metabolite  ([id number]-  carboxylic acid) Present (cut-off 15 ng/mL)  Methodology:  Liquid Chromatography-Tandem Mass Spectrometry  Test developed and characteristics determined by [facility]  Creatinine,UR 05/17/2016 92  20 - 300 mg/dL Final    Semi-Quant THC,UR 05/17/2016 220  ng/mL Final    Cut-off 10 ng/mL    THC/Creat Ratio 05/17/2016 239  ng/mg Final    THC = Delta 9-THC Carboxylic Acid Equivalents    HLA A57 Review 05/17/2016 FINALIZED   Final    Electronically signed by Juliene Pina L. Coppage, PhD HCLD(ABB)       Current Treatment Plan   Created/Updated On 02/21/2016   Next Treatment Plan Due 08/22/2016       Assessment:  40 year old [place] male with good hygiene and grooming.  Pt is pleasant and cooperative.  Today pt reports that his mood is euthymic, sleep, appetite and energy are good, not c/o anhedonia, not feeling hopeless or helpless, no SI/HI, denies hallucinations but does report that on occasion he can tell if something is real or not as such even if he was hallucinating he might not recognize it.  Pt reports that recently he was accused of steeling beer, he was incarcerated and charged with a class A misdemeanor.  Pt reports that the DA is looking to offer him about 8 months in county jail but pt will take this charge to trial as he did not commit the crime.  Pt reports that he has not taken his meds in 5 days and is requesting that I call in a prescription to [facility] on [address].  Pt states that when he does not take his meds he can experience psych decompensations including agitation and aggression.  We discussed the possibility of monthly injections and pt feels this might be a good option secondary to his chronic poor PO med compliance issues. Pt reports that in the past he was on Risperdal X 5 years without any adverse side effects and with good response.  Pt does not remember why he was transitioned off Risperdal.        Plan and Rationale:  We discussed the SE/Benefit  profile of his med regimen including Atypical/Typical antipsychotic and LAI  rx risks, risks of exacerbation current medical conditions, SI risks, other risks of Morbidity and Mortality associated with his medication regimen.  Pt consents to med regimen and assures me that he will contact staff with and SI/HI or other MH or medical concerns.  No new labs indicated at this time.  Will rx Risperdal 0.5 mg QD for tolerability and if tolerated well we can discuss a rx for [place].      Greater than 50% of visit time spent counseling/coordinating care. Time spent counseling:  20 minutes

## 2016-07-31 NOTE — Progress Notes (Signed)
Behavioral Health Progress Note     LENGTH OF SESSION: 25 minutes    Contact Type:  Location: Off Site    Face to Face     Problem(s)/Goals Addressed from Treatment Plan:    Problem 1:   Treatment Problem #1 02/21/2016   Patient Identified Problem unstable housing       Goal for this problem:    Treatment Goal #1 02/21/2016   Patient Identified Goal "I will get my own apartment asap".         Progress towards this goal: Derek Walker reports feeling overwhelmed due to his legal status and relational stressors but reporting improving mood due to taking his medications as prescribed      Mental Status Exam:  APPEARANCE: Casual  ATTITUDE TOWARD INTERVIEWER: Cooperative  MOTOR ACTIVITY: WNL (within normal limits)  EYE CONTACT: Direct  SPEECH: Normal rate and tone  AFFECT: Full Range  MOOD: Normal  THOUGHT PROCESS: Normal  THOUGHT CONTENT: No unusual themes  PERCEPTION: No evidence of hallucinations  CURRENT SUICIDAL IDEATION: patient denies  CURRENT HOMICIDAL IDEATION: Patient denies  ORIENTATION: Alert and Oriented X 3.  CONCENTRATION: WNL  MEMORY:   Recent: intact   Remote: intact  COGNITIVE FUNCTION: Average intelligence  JUDGMENT: Intact  IMPULSE CONTROL: Intact  INSIGHT: Fair    Risk Assessment:  ASSESSMENT OF RISK FOR SUICIDAL BEHAVIOR  Changes in risk for suicide from baseline Formulation of Risk and/or previous intake, including newly identified risk, if any: none      Session Content::  Writer meets with Derek Walker for a scheduled home visit for the first time since his release from jail.  Derek Walker currently residing at his daughter's mother's residence at [facility]  He reports since returning home he has had altercations with various family members and reports his mood to be "easily explosive" and noting that "others don't know I have this illness and that I can just go off".  He reports that he attempts to walk away in order to avoid further conflict but fears that he won't always be able to do so.      He reports  that he is adhering to the medications per schedule and "notes that they are really helping".     Legally, he vacilate as to how he wants to proceed with his case as he returns to court on 11/8.  He has been offered Drug Court but is ambivalent about doing so and possibly will accept the 8 mos jail time and or ask for a trial.      Client reports reduced AH/VH but notes "they are still there", no SI/HI, and endorses that his mood is explosive at times.  He is also drinking regularly.  He continues to be sanctioned by DSS and is pending an appointment [facility] which he notes to be positive for him as well as will lift his sanction.  He also reports to have [facility].  He allows writer to see his medications and reports having enough for the next 4 days.  [facility] [address] as he is banned from the previous location due to his pending charges of stealing $37 worth of beer.    Writer and client identify ACT contacts for the month and client signs the [id number] form.      Visit Diagnosis:    ICD-10-CM ICD-9-CM  1. 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    NEXT APPT: 07/24/16      Lowella DandyJacqueline Kirtan Sada, LCSW

## 2016-08-01 NOTE — Progress Notes (Signed)
Behavioral Health Progress Note     LENGTH OF SESSION: 40 minutes    Contact Type:  Location: Off Site    Face to Face     Problem(s)/Goals Addressed from Treatment Plan:    Problem 1:   Treatment Problem #1 02/21/2016   Patient Identified Problem unstable housing       Goal for this problem:    Treatment Goal #1 02/21/2016   Patient Identified Goal "I will get my own apartment asap".         Progress towards this goal: Derek Walker reports hisstruggles of adjusting to being out of jail while hoping for the best when he returns to court this afternoon.      Mental Status Exam:  APPEARANCE: Casual  ATTITUDE TOWARD INTERVIEWER: Cooperative  MOTOR ACTIVITY: WNL (within normal limits)  EYE CONTACT: Direct  SPEECH: Normal rate and tone  AFFECT: Appropriate  MOOD: Normal  THOUGHT PROCESS: Normal  THOUGHT CONTENT: No unusual themes  PERCEPTION: Within normal limits  CURRENT SUICIDAL IDEATION: patient denies  CURRENT HOMICIDAL IDEATION: Patient denies  ORIENTATION: Alert and Oriented X 3.  CONCENTRATION: WNL  MEMORY:   Recent: intact   Remote: intact  COGNITIVE FUNCTION: Average intelligence  JUDGMENT: Intact  IMPULSE CONTROL: Poor  INSIGHT: Fair    Risk Assessment:  ASSESSMENT OF RISK FOR SUICIDAL BEHAVIOR  Changes in risk for suicide from baseline Formulation of Risk and/or previous intake, including newly identified risk, if any: none      Session Content::  Writer arrived at [facility] [facility] for a scheduled home visit with Derek Sextonalph.  Derek Walker noted a desire to "vent" as he was feeling frustrated by family dynamics noting that last evening he had gotten into an argument with family members resulting in his having to leave his sister's home.  He reports ongoing concerns regarding his increased impulsivity and aggression.      Derek Walker struggles with the fact that he has the potential to make better decisions yet has difficulty accepting responsibility for his actions, including his current legal status and often blames others for "what  they did to me".  This afternoon he is to return to court for reportedly stealing the beer, Derek Walker has not shared one way or another as to rather he committed the crime but reports feeling that he has been unfairly charged and that he could possibly have to serve 8 months in jail for $37 worth of stolen beer.  He hopes for the best upon going to court today but has already made up his mind that he will not be doing drug court but would prefer mental health court.    Derek Walker notes his mood is up and down, denies AH/VH at present time, he is not SI/HI.  He is encouraged to reduce his substance use.  Client advised of his psychopharm appointment on tomorrow and encouraged to remain available so that his medications could re evaluated and he consents to this plan.    Visit Diagnosis:    ICD-10-CM ICD-9-CM   1. 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    NEXT APPT: 07/25/16      Lowella DandyJacqueline Malacki Mcphearson, LCSW

## 2016-08-02 NOTE — Progress Notes (Signed)
Behavioral Health Progress Note     LENGTH OF SESSION: 15 minutes    Contact Type:  Location: Off Site    Face to Face     Problem(s)/Goals Addressed from Treatment Plan:    Problem 1:   Treatment Problem #1 02/21/2016   Patient Identified Problem unstable housing       Goal for this problem:    Treatment Goal #1 02/21/2016   Patient Identified Goal "I will get my own apartment asap".         Progress towards this goal: Kingsten presents as intoxicated, acknowledging that he has been drinking all day with family.      Mental Status Exam:  APPEARANCE: Casual  ATTITUDE TOWARD INTERVIEWER: Cooperative  MOTOR ACTIVITY: WNL (within normal limits)  EYE CONTACT: Direct  SPEECH: Normal rate and tone and Slurred  AFFECT: Agitated, Angry  MOOD: Irritable  THOUGHT PROCESS: Intact  THOUGHT CONTENT: No unusual themes  PERCEPTION: No evidence of hallucinations  CURRENT SUICIDAL IDEATION: patient denies  CURRENT HOMICIDAL IDEATION: Patient denies  ORIENTATION: Alert and Oriented X 3.  CONCENTRATION: WNL  MEMORY:   Recent: intact   Remote: intact  COGNITIVE FUNCTION: Average intelligence  JUDGMENT: Impaired -  mild  IMPULSE CONTROL: Poor  INSIGHT: Fair    Risk Assessment:  Violence risk was assessed and No Change noted from baseline formulation of risk and/or previous assessment.    Session Content::  Writer met with client at [address] which he identified as his sister's residence.  Upon arrival to residence, Dinnis met Probation officer outside and Customer service manager into the residence.  It was clear that he was under the influence.  He noted, that he wanted writer to come and meet his siblings, "so, you can see why I'm like I am".  Upon entry, he introduced Probation officer to his sister and two brothers, all were cordial and all were under the influence.  Clavin acknowledged that they had been drinking "all day" and it was 12:45 at the time of today's visit.  There is evidence of a 20 oz beer off to one side that client acknowledged that he was  Mining engineer did not see.  He is clearly intoxicated and mood vacilates from humorous to agitated as he shares that he has been staying with his sister for the past 2 days as he and his daughter's mother got in a verbal altercation and notes that the verbal exchange on her part has continued via text.  He shows Probation officer some very hostile messages that he has been receiving from the other party and in which he reports that he had discontinued responding to.  He expresses some verbal aggression but no threats and has no plans to return to the residence at the present time.  At one point, he points to a bottle of hand sanitizer on the table and states that he should drink it and upon writer inquiring as to rather he was having suicidal thoughts, he denied and noted "it has alcohol in it". At various times, he stated, "I don't want you to [facility] me" but continuously denied SI/HI, no intent, no plan.  He notes that he plans to discontinue drinking after he finishes his beer and is reminded that alcohol places him at increased risk and impacts his reported feelings of depression. In addition client notes that he has been out of his meds for the past 5 days.   His thoughts are clear and logical at this time.  Client informed that his missed psychopharm has been rescheduled for this coming Wednesday and he agrees to plan of meeting with ACT NP.  Client reports that he did go to court on the 8th and requested a trial by jury but now in his present state acknowledge that he may just ask for the 8 months although he vacilates as to how he would be able to tolerate it when he was unable to tolerate the one month. Client encouraged to refrain from continues substance use, call ACT team/911 as needed. Client plans to remain with family this evening.Client to be seen by ACT provider on Wednesday.    Visit Diagnosis:    ICD-10-CM ICD-9-CM   1. 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    NEXT APPT: 07/31/16      Maryjane Hurter, LCSW

## 2016-08-05 ENCOUNTER — Telehealth: Payer: Self-pay | Admitting: Infectious Diseases

## 2016-08-05 NOTE — Telephone Encounter (Signed)
Derek Walker is calling to schedule an appointment with Tresa EndoKelly. The patient would like to be seen for FUV. Next available isn't until 3/16. Please call the patient to schedule at [phone].

## 2016-08-05 NOTE — Telephone Encounter (Signed)
LEFT patient a message of appointment for 12/1 at 1:00.

## 2016-08-07 ENCOUNTER — Ambulatory Visit: Payer: Self-pay

## 2016-08-11 NOTE — Progress Notes (Signed)
[facility] MISSED/CANCELLED APPOINTMENT     Name: Derek NielsenRALPH D Snader  MRN: [id number]   DOB: 05-11-1976    Date of Scheduled Service: 08/07/16    Mr. Metzner was a no show for today's appointment.  I have left a message with pt's mother and woman pt is residing with (relationship unknown) requesting that the patient contact me.    Additional Information:    Not applicable     Alinda MoneyShannon Sorah Falkenstein, LMSW

## 2016-08-12 ENCOUNTER — Ambulatory Visit: Payer: Self-pay

## 2016-08-12 DIAGNOSIS — F333 Major depressive disorder, recurrent, severe with psychotic symptoms: Secondary | ICD-10-CM

## 2016-08-12 NOTE — Progress Notes (Signed)
Behavioral Health Progress Note     LENGTH OF SESSION: 20 minutes    Contact Type:  Location: Off Site    Face to Face     Problem(s)/Goals Addressed from Treatment Plan:    Problem 1:   Treatment Problem #1 02/21/2016   Patient Identified Problem unstable housing       Goal for this problem:    Treatment Goal #1 02/21/2016   Patient Identified Goal "I will get my own apartment asap".         Progress towards this goal: Derek Walker reported that he had attended court today and was given probation vs jail time.  He remains ambivalent as to rather this was the best option as he does not feel the sentence is reflective of the crime.       Mental Status Exam:  APPEARANCE: Well-groomed  ATTITUDE TOWARD INTERVIEWER: Cooperative  MOTOR ACTIVITY: WNL (within normal limits)  EYE CONTACT: Direct  SPEECH: Normal rate and tone  AFFECT: Full Range  MOOD: Normal  THOUGHT PROCESS: Normal  THOUGHT CONTENT: No unusual themes  PERCEPTION: Within normal limits  CURRENT SUICIDAL IDEATION: patient denies  CURRENT HOMICIDAL IDEATION: Patient denies  ORIENTATION: Alert and Oriented X 3.  CONCENTRATION: WNL  MEMORY:   Recent: intact   Remote: intact  COGNITIVE FUNCTION: Average intelligence  JUDGMENT: Intact  IMPULSE CONTROL: Intact  INSIGHT: Fair    Risk Assessment:  ASSESSMENT OF RISK FOR SUICIDAL BEHAVIOR  Changes in risk for suicide from baseline Formulation of Risk and/or previous intake, including newly identified risk, if any: none      Session Content::  Writer met with client at his home address after initially attempting to locate him at his GF address (@ [address]) for follow-up and engagement.  Derek Walker reported that he had went to court this AM and was offered the choice of 1 year of jail or 3 years probation and he reluctantly accepted probation.  He is scheduled to immediately follow up with a Pre-sentencing Investigation, however, chose not to go today as he is not due back in court until 10/09/16 @ 9:30 for sentencing, "so I  have some time".  Derek Walker is ambivalent about probation as he feels that the sentencing is too harsh for the crime of stealing $37 worth of beer and the fact that "I don't like being monitored".  He questions rather or not he could go to Drug Court yet recognizes that he has done it in the past and that he did not like it.  Ultimately, he recognizes that both probation and drug court require some level of monitoring and that his discomfort stems primarily from the fact that he will have a curfew which he reflects to be challenging as walking and or leaving the environment is one of his stress reducers.  He is encouraged to have this conversation with the pre-sentencing investigator to discuss his options.  In addition we discuss the fact that he has maintained his freedom in that he was not sentenced to jail time as he acknowledges that he has  Difficult time with being incarcerated and that his mental heallh is negatively impacted.  Derek Walker currently wrestling with the today's outcome and endorsing feelings of anxiety at the prseent time with plans of smoking a cigarette,  taking his prescribed medications and returning to his GF home.  There are no current safety concerns, no SI/HI and no AH/VH reported at this time.  Client does continue to report mood dysregulation.  Derek Walker also shared that he has been sanctioned from Derek Walker until 2/23 secondary to failing to comply with substance abuse treatment, per his understanding, the sanction will not be lifted in the event he resumes treatment and therefore, he has decided not to return to Derek Walker at the present time.  Writer had him sign a Release of Info for [employer].  Derek Walker provided Probation officer with the Notice of decision form he received from [facility].      Derek Walker notes that he currently has his medications and reports that he is taking them as prescribed.  He notes that his [facility] appointment was cancelled due to the provider gong out on maternity leave and was  pending a call back.  He notes that he will call them as he is running out of meds.  Client also wanting to know when he is to start the Derek Walker injection and writer to follow up with ACT nursing to see if med order has been placed.    Derek Walker advised of the plan to transfer his care coordination to [facility], LMSW and he is receptive to this plan.  Writer to introduce client to ACT staff in the next day or two upon coordinating schedule with Derek Walker, Derek Walker is agreeable to this and notes that he will be at his GF residence and can be reached at [phone], as he has yet to replace his broken phone.    Derek Walker is encouraged to call back to ACT staff as needed.             Visit Diagnosis:    ICD-10-CM ICD-9-CM   1. Major psychotic depression, recurrent F33.3 296.34       Interventions:  Supportive Psychotherapy    Current Treatment Plan   Created/Updated On 02/21/2016   Next Treatment Plan Due 08/22/2016         Plan:  Psychotherapy continues as described in care plan; plan remains the same.    NEXT APPT: 11/28 or 11/29       Maryjane Hurter, LCSW

## 2016-08-13 ENCOUNTER — Telehealth: Payer: Self-pay

## 2016-08-13 NOTE — Telephone Encounter (Signed)
[facility] Team Telephone Call     Date of call: 08/13/2016 1530    Writer in speaking with ACT Team Staff Member [name] is asked if recent provider psychopharmacology appointment had resulted in pt being prescribed LAI as Derek Sextonalph had asked her when his injection would begin.     Writer review of most recent psychopharm appointment notes on 07/31/16 with [name] as follows:     Plan and Rationale:  We discussed the SE/Benefit profile of his med regimen including Atypical/Typical antipsychotic and LAI  rx risks, risks of exacerbation current medical conditions, SI risks, other risks of Morbidity and Mortality associated with his medication regimen.  Pt consents to med regimen and assures me that he will contact staff with and SI/HI or other MH or medical concerns.  No new labs indicated at this time.  Will rx Risperdal 0.5 mg QD for tolerability and if tolerated well we can discuss a rx for [place]vega Sustenna.      Writer [name] of the above.  Writer next called [facility] via [phone] to inquire if Derek Walker had picked up his Risperdal 0.5 mg script as ordered on 07/31/16. Pharmacy staff advise that this medication, nor his other medications have been picked up (Olanzapine, Remeron, Prazosin, or the Risperdal) and are currently available for pick up.     [name] of the above information for follow up with client.      Laroy AppleLisa A Babara Buffalo, RN

## 2016-08-14 ENCOUNTER — Ambulatory Visit: Payer: Self-pay

## 2016-08-14 ENCOUNTER — Other Ambulatory Visit: Payer: Self-pay | Admitting: Infectious Diseases

## 2016-08-14 DIAGNOSIS — B20 Human immunodeficiency virus [HIV] disease: Secondary | ICD-10-CM

## 2016-08-14 DIAGNOSIS — Z8639 Personal history of other endocrine, nutritional and metabolic disease: Secondary | ICD-10-CM

## 2016-08-14 DIAGNOSIS — Z79899 Other long term (current) drug therapy: Secondary | ICD-10-CM

## 2016-08-15 ENCOUNTER — Telehealth: Payer: Self-pay | Admitting: Infectious Diseases

## 2016-08-15 ENCOUNTER — Telehealth: Payer: Self-pay

## 2016-08-15 NOTE — Telephone Encounter (Signed)
[name] is calling to cancel his appointment which is currently scheduled for tomorrow.    Has the appointment been cancelled? yes    Has the appointment been rescheduled? no    Does the patient need a call back to reschedule?yes    Patient can be contacted back at [phone]

## 2016-08-15 NOTE — Telephone Encounter (Addendum)
[facility] Team Telephone Call     Date of call: 08/15/16     Call recvd to writer from [employer] [facility] advising that Derek Walker had phoned several times and was requesting a call back as was in need of his medications.     Writer call to Derek Walker to assist.  [facility] and [facility] to [facility] his scripts for his meds.  Writer advised has called [facility] on his behalf on 11/28 and meds were ready for pick up. He states that when he arrived, [facility] staff advised him the meds were not ready.  Writer offers to [employer] [facility] to discuss.     [facility].  As meds had not been picked up for 12 days, staff advised they had been placed back in stock.  Writer requests that refills be completed and staff agreeable, they will be ready on Fri 08/16/16.      Call back to Derek Walker, advised of the above, he states he will pick them up tomm 08/16/16.  [phone])    Writer advised Primary Care Coordinator Derek Walker of the above.   Of note, Derek Walker is also in need of labs, will request that he be reminded of this as well to have completed, or assist him with same.     Laroy AppleLisa A Oral Hallgren, RN

## 2016-08-15 NOTE — Telephone Encounter (Signed)
Please call patient and have him get labs drawn at a [facility] lab. Orders are in the system. If these are stable and he feels well he can booked for visit with me in April.     If he has any acute issues where he needs a sooner appointment, please send to nursing for triage.

## 2016-08-16 ENCOUNTER — Ambulatory Visit: Payer: Self-pay | Admitting: Infectious Diseases

## 2016-08-16 NOTE — Telephone Encounter (Signed)
Called patient

## 2016-08-16 NOTE — Telephone Encounter (Signed)
Called patient and informed him he needed to have [facility].

## 2016-08-19 ENCOUNTER — Ambulatory Visit: Payer: Self-pay

## 2016-08-19 NOTE — Progress Notes (Signed)
Behavioral Health Progress Note     LENGTH OF SESSION: 30 minutes    Contact Type:  Location: Off Site    Face to Face     Problem(s)/Goals Addressed from Treatment Plan:    Problem 1:   Treatment Problem #1 02/21/2016   Patient Identified Problem unstable housing       Goal for this problem:    Treatment Goal #1 02/21/2016   Patient Identified Goal "I will get my own apartment asap".         Progress towards this goal: Problem resolving. Comment: Pt is currently residing with his "daughter's mother".      Mental Status Exam:  APPEARANCE: Appears stated age, Well-groomed  ATTITUDE TOWARD INTERVIEWER: Cooperative  MOTOR ACTIVITY: WNL (within normal limits)  EYE CONTACT: Direct  SPEECH: Normal rate and tone  AFFECT: Full Range  MOOD: Normal  THOUGHT PROCESS: Normal and Circumstantial  THOUGHT CONTENT: No unusual themes  PERCEPTION: Within normal limits  CURRENT SUICIDAL IDEATION: patient denies  CURRENT HOMICIDAL IDEATION: Patient denies  ORIENTATION: Alert and Oriented X 3.  CONCENTRATION: WNL  MEMORY:   Recent: intact   Remote: intact  COGNITIVE FUNCTION: Average intelligence  JUDGMENT: Intact  IMPULSE CONTROL: Fair  INSIGHT: Fair    Risk Assessment:  ASSESSMENT OF RISK FOR SUICIDAL BEHAVIOR  Changes in risk for suicide from baseline Formulation of Risk and/or previous intake, including newly identified risk, if any: none      Session Content::  Wtr and pt discussed pt attending chemical dependency tx and pt was agreeable. Pt stated that he would need bus passes to get there as chem dep tx gives attendees bus passes to commute home only. Pt stated that he was just getting up and started for his day [facility]. Wtr offered assistance by offering pt a ride and pt stated that he would walk. Wtr and pt discussed overview of immediate case management needs such as need for a new ID (has old ID that is expired), to set-up an appointment for pre-sentencing investigation for his 3y  probation. Pt stated that he thought his sentence was excessive for his crime (1 year for stealing approximately $37 beer). Wtr and pt discussed and wtr provided supportive psychotherapy and discussed problem solving strategies. Pt was agreeable and appeared appreciative. Wtr said good-bye and left.     Visit Diagnosis:    1. 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    NEXT APPT: 08/19/16 as per ACT calendar      Alinda MoneyShannon Nakyah Erdmann, LMSW

## 2016-08-22 ENCOUNTER — Other Ambulatory Visit: Payer: Self-pay | Admitting: Infectious Diseases

## 2016-08-22 ENCOUNTER — Ambulatory Visit: Payer: Self-pay

## 2016-08-22 MED ORDER — EFAVIRENZ-EMTRICITAB-TENOFOVIR 600-200-300 MG PO TABS *I*
1.0000 | ORAL_TABLET | Freq: Every evening | ORAL | 3 refills | Status: DC
Start: 2016-08-22 — End: 2017-02-18

## 2016-08-22 NOTE — Progress Notes (Signed)
Behavioral Health Progress Note     LENGTH OF SESSION: 15 minutes    Contact Type:  Location: Off Site    Face to Face     Problem(s)/Goals Addressed from Treatment Plan:    Problem 1:   Treatment Problem #1 02/21/2016   Patient Identified Problem unstable housing       Goal for this problem:    Treatment Goal #1 02/21/2016   Patient Identified Goal "I will get my own apartment asap".         Progress towards this goal: Problem resolving. Comment: Chemical dependency issues are a major barrier for pt to acquire and maintain stable housing. Pt appears motivated to address chem dep issues through [facility] (at [address], [place], [place])      Mental Status Exam:  APPEARANCE: Appears stated age, Well-groomed  ATTITUDE TOWARD INTERVIEWER: Cooperative  MOTOR ACTIVITY: WNL (within normal limits)  EYE CONTACT: Direct  SPEECH: Normal rate and tone and Age appropriate  AFFECT: Full Range and Pleasant  MOOD: Normal  THOUGHT PROCESS: Normal, Circumstantial and Goal directed  THOUGHT CONTENT: No unusual themes  PERCEPTION: Within normal limits  CURRENT SUICIDAL IDEATION: patient denies  CURRENT HOMICIDAL IDEATION: Patient denies  ORIENTATION: Alert and Oriented X 3.  CONCENTRATION: WNL  MEMORY:   Recent: intact   Remote: intact  COGNITIVE FUNCTION: Average intelligence  JUDGMENT: Intact  IMPULSE CONTROL: Fair  INSIGHT: Good    Risk Assessment:  ASSESSMENT OF RISK FOR SUICIDAL BEHAVIOR  Changes in risk for suicide from baseline Formulation of Risk and/or previous intake, including newly identified risk, if any: none      Session Content::  Wtr called pt when on way to pick [name] up to attend chem dep tx intake appointment and pt stated that he was just about to leave and asked wtr if they would like to meet there. Wtr commended pt's initiative and wtr obliged to meet pt at [facility] [phone]([address] Wtr pulled into parking lot and saw pt standing in between cars in the parking lot  lighting what appeared to be a cigarette-butt. Wtr collected things briefly and got out of vehicle to find pt had already left parking lot. Wtr walked into counseling center and pt was standing just inside the entrance and greeted the wtr with a smile and a handshake. Pt stated that he felt motivated to attend chem dep tx and was up early this morning and "figured I'd take the walk over". Wtr commended pt on his motivation. Wtr asked pt if he had attended pre-investigation meeting for probation as pt had planned to do yesterday 08/21/16 and pt stated that he did not. Wtr asked pt if he would like wtr to assist pt in getting to apt tomorrow 08/23/16. Pt stated that he was nervous and that it would be helpful for wtr to transport pt to meeting. Wtr obliged. Pt appeared well-groomed, well-rested, and was cooperative, collaborative, and pleasant. Wtr asked pt if he would like to attend his intake appointment with a counselor independently and pt stated that he would. Wtr wished pt well, wtr and pt shook hands, and wtr left counseling center.    Visit Diagnosis:      Major psychotic depression, recurrent - Primary ICD-10-CM: F33.3  ICD-9-CM: 296.34       Interventions:  Behavioral Activation  Continued to develop therapeutic relationship  Motivational Interviewing  Supportive Psychotherapy    Current Treatment Plan  Created/Updated On 02/21/2016   Next Treatment Plan Due 08/22/2016         Plan:  Psychotherapy continues as described in care plan; plan remains the same.    NEXT APPT: 08/23/16      Alinda MoneyShannon Zoraida Havrilla, LMSW

## 2016-08-22 NOTE — Telephone Encounter (Signed)
Derek Walker calling to request prescription ATRIPLA  to be sent to [facility].    Is patient out of the medication? yes  Does the patient have questions regarding the medication for the nurse? no    Patient can be reached if necessary at [phone].  Patient's last monitor was   Lab Results   Component Value Date    HIV NEG 05/17/2016     Patient informed writer that he will be in tomorrow morning to have blood work done.

## 2016-08-23 ENCOUNTER — Ambulatory Visit: Payer: Self-pay

## 2016-08-23 NOTE — Progress Notes (Signed)
[facility] MISSED/CANCELLED APPOINTMENT     Name: Derek NielsenRALPH D Gurr  MRN: [id number]   DOB: 1976/09/12    Date of Scheduled Service: 08/23/2016    Mr. Cancro was a no show for today's appointment.  I have discussed today's appointment with my supervisor. and left a message with [name] requesting that the patient contact me.    Additional Information:    Not applicable     [name], LMSW

## 2016-08-23 NOTE — Progress Notes (Signed)
Behavioral Health Progress Note     LENGTH OF SESSION: 45 minutes    Contact Type:  Location: Off Site    Face to Face     Problem(s)/Goals Addressed from Treatment Plan:    Problem 1:   Treatment Problem #1 02/21/2016   Patient Identified Problem unstable housing       Goal for this problem:    Treatment Goal #1 02/21/2016   Patient Identified Goal "I will get my own apartment asap".         Progress towards this goal: No change. Comment: Wtr currently living with pt's "daughter's mother", [name]. Pt has other immediate needs to be addressed before working toward housing stability such as chem dep issues and complying with legal/DHS requirements for probation and to receive full benefits.      Mental Status Exam:  APPEARANCE: Appears stated age, Well-groomed, Casual  ATTITUDE TOWARD INTERVIEWER: Cooperative  MOTOR ACTIVITY: WNL (within normal limits)  EYE CONTACT: Direct  SPEECH: Normal rate and tone  AFFECT: Full Range, Appropriate and Pleasant  MOOD: Normal  THOUGHT PROCESS: Normal and Goal directed  THOUGHT CONTENT: No unusual themes  PERCEPTION: Within normal limits  CURRENT SUICIDAL IDEATION: patient denies  CURRENT HOMICIDAL IDEATION: Patient denies  ORIENTATION: Alert and Oriented X 3.  CONCENTRATION: WNL  MEMORY:   Recent: intact   Remote: intact  COGNITIVE FUNCTION: Average intelligence  JUDGMENT: Intact  IMPULSE CONTROL: Fair  INSIGHT: Fair    Risk Assessment:  ASSESSMENT OF RISK FOR SUICIDAL BEHAVIOR  Changes in risk for suicide from baseline Formulation of Risk and/or previous intake, including newly identified risk, if any: none      Session Content::  Pt arrived to pt's residence on [address] [name]', pt's "Daughter's mother" answered the door and invited wtr in. [name] invited wtr to take a seat and wtr obliged. [name] went to get pt. Pt came down hallway and greeted wtr with a handshake. Wtr and pt discussed chemical dependency tx at [facility], past tx as well as motivation to  attend again. Wtr asked pt if he picked up his medication this past Friday 08/16/16 as planned, pt stated that he had not. Wtr asked pt if he would like a ride there and pt obliged. Wtr and pt went to [facility] on [address] And picked up pt's medications. Wtr and pt drove to [facility] and made an appointment for pt for chem dep outpatient tx services. Wtr drove pt back to his residence and pt thanked wtr.Wtr and pt planned on wtr transporting pt to chem dep tx intake appointment 08/22/16.     Visit Diagnosis:    Major psychotic depression, recurrent - Primary ICD-10-CM: F33.3  ICD-9-CM: 296.34       Interventions:  Continued to develop therapeutic relationship  Motivational Interviewing  Problem Solving Therapy skills  Supportive Psychotherapy    Current Treatment Plan   Created/Updated On 02/21/2016   Next Treatment Plan Due 08/22/2016         Plan:  Psychotherapy continues as described in care plan; plan remains the same.    NEXT APPT: 08/22/16 chem dep tx intake apt [facility]      Alinda MoneyShannon Keely Drennan, LMSW

## 2016-08-26 ENCOUNTER — Ambulatory Visit: Payer: Self-pay

## 2016-08-26 NOTE — Progress Notes (Signed)
Behavioral Health Progress Note     LENGTH OF SESSION: 30 minutes    Contact Type:  Location: Off Site    Face to Face     Problem(s)/Goals Addressed from Treatment Plan:    Problem 1:   Treatment Problem #1 02/21/2016   Patient Identified Problem unstable housing       Goal for this problem:    Treatment Goal #1 02/21/2016   Patient Identified Goal "I will get my own apartment asap".         Progress towards this goal: No change. Comment: Pt is currently living with his "daughter's mother", "step-son", and daughter nicknamed "[name]". Pt has [facility] intake [facility] for chemical dependency tx and has evaded going to pre-investigation probtation appointment for approximately 2 weeks. Pt will need to progress further with these needs before addressing his own housing.       Mental Status Exam:  APPEARANCE: Appears stated age, Well-groomed  ATTITUDE TOWARD INTERVIEWER: Cooperative  MOTOR ACTIVITY: WNL (within normal limits)  EYE CONTACT: Direct  SPEECH: Soft  AFFECT: Anxious and Pleasant  MOOD: Anxious  THOUGHT PROCESS: Circumstantial and Goal directed  THOUGHT CONTENT: Normal  PERCEPTION: Within normal limits  CURRENT SUICIDAL IDEATION: patient denies  CURRENT HOMICIDAL IDEATION: Patient denies  ORIENTATION: Alert and Oriented X 3.  CONCENTRATION: WNL  MEMORY:   Recent: intact   Remote: intact  COGNITIVE FUNCTION: Average intelligence  JUDGMENT: Impaired -  minimal  IMPULSE CONTROL: Fair  INSIGHT: Fair    Risk Assessment:  ASSESSMENT OF RISK FOR SUICIDAL BEHAVIOR  Changes in risk for suicide from baseline Formulation of Risk and/or previous intake, including newly identified risk, if any: none      Session Content::  Wtr knocked on pt's residence door and someone lifted the curtains to see who was there. Pt's "step-son" answered the door. Wtr greeted pt's step-son and pt's step-son invited wtr in. Wtr asked if [name] was home and pt's step-son stated that he was coming. Wtr thanked pt's  step-son and waited by the door. [name] came walking down the hall-way and greeted wtr and wtr and pt shook hands. Pt invited wtr to sit down and wtr did so. Wtr asked pt if he went to pre-investigation probation appointment and pt stated that he had not. Wtr asked pt when his next [facility] chemical dependency tx appointment was and pt stated that he had an intake appointment [facility] on 08/27/2016 at 10:30am. Wtr asked pt if he would like a ride to appointment and pt stated that he would. Wtr agreed. Wtr asked pt if he would like to go to pre-investigation appointment after RN intake appointment and pt appeared slightly anxious and agreed. Wtr stated, "I know you were scheduled to go to your pre-investigation probation appointment after you had court at least a couple of weeks ago, but I'm sure that happens often enough and I can attend with you". Pt appeared relieved and agreed. Wtr asked pt how he was doing and pt stated that he was "mellow" and preferred to be "mellow" and to have a "mellow environment". Wtr agreed that it was pleasant to feel mellow and have a mellow environment. Wtr asked pt if there were specific symptoms of his mental health issues that having a mellow environment was helpful for and pt stated that he experiences "anxiety" and more specifically "paranoia of other people, strangers... Not what they would do to me or bad intent...but  then again what are people capable of, you know?". Wtr agreed that having some control over your enviornment would be very beneficial in dealing with paranoia and and anxiety. Pt stated that he rarely leaves the house except to go to another environment he feels comfortable in, which is at his mother's house. Pt stated that he has a "beautiful relationship" with his mother. Pt stated that he spends some time with his older brother but that pt's older brother also has his own family and so they don't spend time together often. Wtr stated,  "I'm happy to hear that you have social supports, that is very important to have". Pt agreed. Wtr and pt dsicussed his family of origin as well as his daughter Stacy Gardnernicknamed "Wille Celesteooh" that he resides with. Pt stated that his daughter was in kindergarten and loves school which pt was happy about. Pt stated that he helps [name] with her homework everyday when she comes home from school and that she has a very bubbly personality. Wtr stated, "She sounds wonderful, you must be very proud of her". Pt agreed. Wtr scheduled to pick-up pt from pt's residence 08/27/2016 to attend [facility] RN intake appointment Followed by pre-inivestigation probation appointment. Pt agreed. Wtr wished pt a good rest of his day. Pt thanked wtr, wtr and pt shook hands, and wtr left.     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    NEXT APPT: 08/27/2016, as per ACT schedule      Alinda MoneyShannon Tola Meas, LMSW

## 2016-08-27 ENCOUNTER — Ambulatory Visit: Payer: Self-pay

## 2016-08-27 NOTE — Progress Notes (Signed)
Behavioral Health Progress Note     LENGTH OF SESSION: 60 minutes    Contact Type:  Location: Off Site    Face to Face     Problem(s)/Goals Addressed from Treatment Plan:    Problem 1:   Treatment Problem #1 02/21/2016   Patient Identified Problem unstable housing       Goal for this problem:    Treatment Goal #1 02/21/2016   Patient Identified Goal "I will get my own apartment asap".         Progress towards this goal: No change. Comment: Pt is currently living with his "daughter's mother", "step-son", and daughter nicknamed "[name]". Pt has [facility] for chemical dependency tx and has evaded going to pre-investigation probtation appointment for approximately 2 weeks. Pt will need to progress further with these needs before addressing his own housing.       Mental Status Exam:  APPEARANCE: Appears stated age, Well-groomed  ATTITUDE TOWARD INTERVIEWER: Cooperative  MOTOR ACTIVITY: WNL (within normal limits)  EYE CONTACT: Direct  SPEECH: Normal rate and tone  AFFECT: Anxious and Pleasant  MOOD: Anxious  THOUGHT PROCESS: Goal directed  THOUGHT CONTENT: No unusual themes  PERCEPTION: Within normal limits  CURRENT SUICIDAL IDEATION: patient denies  CURRENT HOMICIDAL IDEATION: Patient denies  ORIENTATION: Alert and Oriented X 3.  CONCENTRATION: WNL  MEMORY:   Recent: intact   Remote: intact  COGNITIVE FUNCTION: Average intelligence  JUDGMENT: Intact  IMPULSE CONTROL: Fair  INSIGHT: Fair    Risk Assessment:  ASSESSMENT OF RISK FOR SUICIDAL BEHAVIOR  Changes in risk for suicide from baseline Formulation of Risk and/or previous intake, including newly identified risk, if any: none      Session Content:    Wtr transported pt from residence to [facility] on [address] Pt was dressed appropriately for cold weather and moderate snowfall. [facility] informed pt that appointment was cancelled and re-scheduled for 08/28/16 due to RN not  being at work today. Pt took well and wtr asked pt if he would like to continue on in their schedule and go to the pre-investigation probation appointment. Pt agreed. Wtr drove pt to probation appointment on [address]. Wtr went into building with pt and went through security, checked-in, pt filled out paperwork, and pt was given card noting "You will be notified by mail of an appointment date" and if not have not received your appointment letter by 09/19/16 contact Probation at [phone]. Wtr and pt left and wtr drove pt back to his residence. Pt stated that the appointment was "easier" than he thought it would be and appeared to be relieved. Wtr commended pt on his motivation to attend chem dep tx and adhere to probation services. Wtr and pt discussed pt's children, "3 girls and 3 boys", in the car-ride. Wtr and pt discussed problematic familial behavioral patterns as well as familial strengths. Wtr and pt said good-byes. Wtr left.    Visit Diagnosis:    Severe episode of recurrent major depressive disorder, without psychotic features - Primary ICD-10-CM: F33.2  ICD-9-CM: 296.33       Interventions:  Continued to develop therapeutic relationship  Motivational Interviewing  Supportive Psychotherapy    Current Treatment Plan   Created/Updated On 02/21/2016   Next Treatment Plan Due 08/22/2016         Plan:  Psychotherapy continues as described in care plan; plan remains the same.  NEXT APPT: as per ACT schedule      Alinda MoneyShannon Izak Anding, LMSW

## 2016-08-30 ENCOUNTER — Ambulatory Visit: Payer: Self-pay

## 2016-09-02 ENCOUNTER — Ambulatory Visit: Payer: Self-pay

## 2016-09-02 NOTE — Progress Notes (Signed)
Behavioral Health Progress Note     LENGTH OF SESSION: 15 minutes    Contact Type:  Location: Off Site    Face to Face     Problem(s)/Goals Addressed from Treatment Plan:    Problem 1:   Treatment Problem #1 02/21/2016   Patient Identified Problem unstable housing       Goal for this problem:    Treatment Goal #1 02/21/2016   Patient Identified Goal "I will get my own apartment asap".         Progress towards this goal: No change. Comment: Pt is currently living with his "daughter's mother", "step-son", and daughter nicknamed "[name]". Pt has [facility] for chemical dependency tx and has evaded going to pre-investigation probtation appointment for approximately 2 weeks. Pt will need to progress further with these needs before addressing his own housing.       Mental Status Exam:  APPEARANCE: Appears stated age, Well-groomed  ATTITUDE TOWARD INTERVIEWER: Cooperative  MOTOR ACTIVITY: WNL (within normal limits)  EYE CONTACT: Direct  SPEECH: Normal rate and tone and Age appropriate  AFFECT: Pleasant  MOOD: Normal  THOUGHT PROCESS: Normal and Goal directed  THOUGHT CONTENT: No unusual themes  PERCEPTION: Within normal limits  CURRENT SUICIDAL IDEATION: patient denies  CURRENT HOMICIDAL IDEATION: Patient denies  ORIENTATION: Alert and Oriented X 3.  CONCENTRATION: WNL  MEMORY:   Recent: intact   Remote: intact  COGNITIVE FUNCTION: Average intelligence  JUDGMENT: Intact  IMPULSE CONTROL: Fair  INSIGHT: Fair    Risk Assessment:  ASSESSMENT OF RISK FOR SUICIDAL BEHAVIOR  Changes in risk for suicide from baseline Formulation of Risk and/or previous intake, including newly identified risk, if any: none      Session Content::  Wtr arrived at pt's house and pt answered the door. The residence smelled of cleaning supplies and was warm. Pt stated that he had just cleaned the apartment. Wtr commended pt for doing so and complimented how clean the apartment looked. Pt stated that he was looking  forward to his youngest daughter [name] coming home from school. Pt stated that orientation [facility] for Chem Dep Tx program went well and would be starting group on Monday 09/02/16 and would be attending group Mon-Thur 9:30-10:20 am. Wtr commended pt for following through w/ tx program. Wtr delivered bus pass to pt. Wtr confirmed appointment w/ pt on 09/02/16. Pt confirmed. Wtr and pt said good-bye. Wtr left.    Visit Diagnosis:    Severe episode of recurrent major depressive disorder, without psychotic features- Primary ICD-10-CM: F33.2  ICD-9-CM: 296.33       Interventions:  Continued to develop therapeutic relationship  Treatment Planning    Current Treatment Plan   Created/Updated On 02/21/2016   Next Treatment Plan Due 08/22/2016         Plan:  Psychotherapy continues as described in care plan; plan remains the same.    NEXT APPT: 09/02/16 as per ACT calendar      Alinda MoneyShannon Adreana Coull, LMSW

## 2016-09-05 ENCOUNTER — Ambulatory Visit: Payer: Self-pay | Admitting: Psychiatry

## 2016-09-05 DIAGNOSIS — F515 Nightmare disorder: Secondary | ICD-10-CM

## 2016-09-05 DIAGNOSIS — F323 Major depressive disorder, single episode, severe with psychotic features: Secondary | ICD-10-CM

## 2016-09-05 MED ORDER — OLANZAPINE 15 MG PO TABS *I*
15.0000 mg | ORAL_TABLET | Freq: Every evening | ORAL | 5 refills | Status: DC
Start: 2016-09-05 — End: 2016-11-28

## 2016-09-05 MED ORDER — PRAZOSIN HCL 1 MG PO CAPS *I*
2.0000 mg | ORAL_CAPSULE | Freq: Every evening | ORAL | 5 refills | Status: DC
Start: 2016-09-12 — End: 2016-10-03

## 2016-09-05 MED ORDER — OLANZAPINE 5 MG PO TABS *I*
5.0000 mg | ORAL_TABLET | Freq: Every morning | ORAL | 5 refills | Status: DC
Start: 2016-09-05 — End: 2016-11-28

## 2016-09-05 NOTE — Progress Notes (Signed)
Behavioral Health Psychopharmacology Follow-up     Length of Session: 15 minutes.    Diagnosis Addressed    ICD-10-CM ICD-9-CM   1. 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  He reports that holidays trigger past trauma hx and during the holidays he tends to isolate, regress and avoid family. He drank heavily last night but reports that was not his recent pattern of minimal drinking.  He has started outpatient CD tx to deal with his Alcohol Dependence.  Additionally, pt reports that for his criminal charges he has an offer of three years of probation or 8 months in county jail.  He is tending on accepting the jail time over the county time as he believes jail would be less stressful than probation.   He continues to have poor sleep secondary to poorly controlled nightmares.  He is not sure if he is experiencing A/V hallucinations as all his life experiances seems real to him, though he can not articulate experiences that he can identify as frank hallucinations.  Pt reports holidays are resulting in passive SI but adamantly denies any active SI/HI.  Feeling helpless but not hopeless.      There were no vitals taken for this visit.  Wt Readings from Last 3 Encounters:   05/17/16 70.7 kg (155 lb 13.8 oz)   10/11/15 74.9 kg (165 lb 1.6 oz)   09/21/15 74.4 kg (164 lb)       Recent History and Response to Medications  Pt is seen at his resiedence,     Current use of alcohol or drugs: Drank heavily last night    ENERGY: Fair  SLEEP: Insomnia:  Initial, Middle and Terminal.    APPETITE: Fair  WEIGHT: Not assessed at this time  SEXUAL FUNCTION: not asked  ENJOYMENT/INTEREST: Poor    Review of Systems:  Pertinent items are noted in HPI.    Current Medications  Current Outpatient Prescriptions   Medication Sig    efavirenz-emtrictabine-tenofovir (ATRIPLA) 600-200-300 MG per tablet Take 1 tablet by mouth nightly on an empty stomach     risperiDONE (RISPERDAL) 0.5 MG tablet Take 1 tablet (0.5 mg total) by mouth daily    mirtazapine (REMERON) 15 MG tablet Take 1 tablet (15 mg total) by mouth nightly    prazosin (MINIPRESS) 1 MG capsule Take 1 capsule (1 mg total) by mouth nightly   for nightmares    OLANZapine (ZYPREXA) 5 MG tablet Take 1 tablet by mouth twice daily for 5 days, then take 1 tablet by mouth in the morning & take 2 tablets by mouth nightly.    dronabinol (MARINOL) 5 MG capsule Take 1 capsule (5 mg total) by mouth 2 times daily (before meals)   Max daily dose: 10 mg    gabapentin (NEURONTIN) 100MG capsule Take 1 capsule by mouth 3 times daily for 3 days, then take 2 capsules by mouth 3 times daily.    baclofen (LIORESAL) 10 MG tablet Take 1 tablet (10 mg total) by mouth 2 times daily as needed for Muscle spasms    lidocaine (XYLOCAINE) 5 % ointment Apply topically 3 times daily as needed for Pain (neck stiffness and pain)   Apply to top of right foot and small toes    HYDROcodone-acetaminophen (NORCO) 5-325 MG per tablet Take 1 tablet by mouth every 8 hours as needed for Pain   Max daily dose: 3 tablets  terbinafine (LAMISIL) 1 % cream Apply topically 2 times daily    omeprazole (PRILOSEC) 20 MG capsule Take 1 capsule (20 mg total) by mouth nightly    nicotine polacrilex (COMMIT) 4 MG lozenge Place 1 lozenge (4 mg total) inside cheek as needed for Smoking cessation   Max daily dose: 20 lozenges No more than 5 pieces in 6hrs.     No current facility-administered medications for this visit.        Side Effects  None    Mental Status  APPEARANCE: Appears stated age  ATTITUDE TOWARD INTERVIEWER: Cooperative  MOTOR ACTIVITY: WNL (within normal limits)  EYE CONTACT: Direct  SPEECH: Normal rate and tone  AFFECT: Constricted  MOOD: Anxious and Depressed  THOUGHT PROCESS: Normal  THOUGHT CONTENT: No unusual themes  PERCEPTION: Within normal limits and No evidence of hallucinations  ORIENTATION: Alert and Oriented X  3.  CONCENTRATION: Fair  MEMORY:   Recent: intact   Remote: intact  COGNITIVE FUNCTION: Average intelligence  JUDGMENT: Impaired -  moderate  IMPULSE CONTROL: Fair  INSIGHT: Fair    Risk Assessment  Self Injury: Patient Denies  Suicidal Ideation: Patient Denies  Homicidal Ideation: Patient Denies  Aggressive Behavior: Patient Denies    If any of the answers above are Yes, is there access to lethal means?   N/A    [place] Scale administered? N/A    Results  All labs in the last 3 months:  No visits with results within 3 Month(s) from this visit.  Latest known visit with results is:    Office Visit on 05/17/2016   Component Date Value Ref Range Status    Cholesterol 05/17/2016 197  mg/dL Final    Comment: REFERENCE RANGE:  < 200 Desirable                  200-239 Borderline High                    > 240 High      Triglycerides 05/17/2016 65  mg/dL Final    Comment: REFERENCE RANGE:  < 150 Normal                  150-199 Borderline High                  200-499 High                    > 500 Very High      HDL 05/17/2016 102  mg/dL Final    Comment: REFERENCE RANGE:  < 40 Low                    > 60 High      LDL Calculated 05/17/2016 82  mg/dL Final    Comment: REFERENCE RANGE:  < 100 Optimal                  100-129 Near or above optimal                  130-159 Borderline High                  160-189 High                    > 189 Very High      Non HDL Cholesterol 05/17/2016 95  mg/dL Final    Target is 29m/dl above(or over) LDL goal  Chol/HDL Ratio 05/17/2016 1.9   Final    Hemoglobin A1C 05/17/2016 5.2  4.0 - 6.0 % Final                                  NON-DIABETIC    Syphilis Screen 05/17/2016 Neg   Final    Test Method: CMIA    Syphilis Status 05/17/2016 Nonreact   Final    HIV-1 PCR log 05/17/2016 NEG  copies/mL Final    HIV-1 PCR 05/17/2016 NEG  copies/mL Final    Comment: Lower Limit of Detection: 20 copies/mL  Dynamic Range: 1.3 - 7.0 log copies/mL  Dynamic Range: 20 - 10,000,000  copies/mL    Accurate quantification not possible at < 20 copies/mL.  Specimens in the 1-19 copies/mL range are reported qualitatively  as POS<20.    This specimen was tested using the Roche COBAS Ampliprep COBAS Taqman HIV-1  version 2.0 test.      WBC 05/17/2016 8.5  4.2 - 9.1 THOU/uL Final    RBC 05/17/2016 4.6  4.6 - 6.1 MIL/uL Final    Hemoglobin 05/17/2016 15.8  13.7 - 17.5 g/dL Final    Hematocrit 05/17/2016 46  40 - 51 % Final    MCV 05/17/2016 100* 79 - 92 fL Final    MCH 05/17/2016 34* 26 - 32 pg/cell Final    MCHC 05/17/2016 34  32 - 37 g/dL Final    RDW 05/17/2016 11.7  11.6 - 14.4 % Final    Platelets 05/17/2016 CANCELED  150 - 330 THOU/uL Final    Comment: Adequate but clumped    Result canceled by the ancillary      Seg Neut % 05/17/2016 76.9  % Final    Lymphocyte % 05/17/2016 13.3  % Final    Monocyte % 05/17/2016 7.4  % Final    Eosinophil % 05/17/2016 1.4  % Final    Basophil % 05/17/2016 0.6  % Final    Neut # K/uL 05/17/2016 6.6* 1.8 - 5.4 THOU/uL Final    Lymph # K/uL 05/17/2016 1.1* 1.3 - 3.6 THOU/uL Final    Mono # K/uL 05/17/2016 0.6  0.3 - 0.8 THOU/uL Final    Eos # K/uL 05/17/2016 0.1  0.0 - 0.5 THOU/uL Final    Baso # K/uL 05/17/2016 0.1  0.0 - 0.1 THOU/uL Final    Nucl RBC % 05/17/2016 0.1  0.0 - 0.2 /100 WBC Final    Nucl RBC # K/uL 05/17/2016 0.0  0.0 - 0.0 THOU/uL Final    IMM Granulocytes # 05/17/2016 0.0  0.0 - 0.1 THOU/uL Final    IMM Granulocytes 05/17/2016 0.4  % Final    Sodium 05/17/2016 138  133 - 145 mmol/L Final    Potassium 05/17/2016 CANCELED  3.3 - 5.1 mmol/L Final    Comment: Hemolyzed    Result canceled by the ancillary      Chloride 05/17/2016 99  96 - 108 mmol/L Final    CO2 05/17/2016 20  20 - 28 mmol/L Final    Anion Gap 05/17/2016 19* 7 - 16 Final    UN 05/17/2016 4* 6 - 20 mg/dL Final    Creatinine 05/17/2016 0.68  0.67 - 1.17 mg/dL Final    GFR,Caucasian 05/17/2016 119  * Final    GFR,Black 05/17/2016 138  * Final     *UNITS=mL/min/1.73 square meters    Glucose 05/17/2016 72  60 - 99  mg/dL Final    Comment: Reference Ranges apply only to FASTING samples.    ADA Guidelines Blood Sugar Levels for Diagnosing Diabetes & Pre-diabetes  Normal: < 100 mg/dL  Impaired Fasting Glucose (IFG): 100-125 mg/dL  Diabetes:  > 126 mg/dL on two different occasions      Calcium 05/17/2016 9.6  9.0 - 10.3 mg/dL Final    Total Protein 05/17/2016 7.7  6.3 - 7.7 g/dL Final    Albumin 05/17/2016 4.3  3.5 - 5.2 g/dL Final    Bilirubin,Total 05/17/2016 0.4  0.0 - 1.2 mg/dL Final    AST 05/17/2016 CANCELED  0 - 50 U/L Final    Comment: Hemolyzed    Result canceled by the ancillary      ALT 05/17/2016 CANCELED  0 - 50 U/L Final    Comment: Hemolyzed    Result canceled by the ancillary      Alk Phos 05/17/2016 CANCELED  40 - 130 U/L Final    Comment: Hemolyzed    Result canceled by the ancillary      T LYM %(CD3) 05/17/2016 76  54 - 87 % Final    T LYM #(CD3) 05/17/2016 907  754 - 2810 cells/uL Final    CD4% 05/17/2016 22* 32 - 71 % Final    CD4# 05/17/2016 264* 496 - 2186 cells/uL Final    T Suppress %(CD8) 05/17/2016 49* 10 - 38 % Final    CD8# 05/17/2016 588  177 - 1137 cells/uL Final    CD4/CD8 05/17/2016 0.4* 0.7 - 3.0 Final    T LYM% (CD3+4+8+) 05/17/2016 1  % Final    T LYM# (CD3+4+8+) 05/17/2016 10  cells/uL Final    T LYM% (CD3+4-8-) 05/17/2016 6  % Final    T LYM# (CD3+4-8-) 05/17/2016 68  cells/uL Final    Interp,LMH 05/17/2016 see message   Final    Comment: Reference Range Note:  Pediatric reference values (0-16 years) taken from  [facility], Number 3.  Adult ranges developed in-house in October, 2002.      HLA-B*57:01 05/17/2016 NEG   Final    HLA-B*57:10 05/17/2016 NEG   Final    HLA-B*57:08 05/17/2016 NEG   Final    Interp,HLA57 05/17/2016 see text   Final    Comment: Method: Sequence Specific Oligonucleotide (SSO)    This test was developed and its performance characteristics  determined by [facility].  It  has not been cleared or approved by the U.S. Food and Drug  Administration.  The FDA has determined that such clearance  or approval is not necessary for clinical use of this test.  The results should not be used as a sole diagnostic or  therapeutic criterion.      Reviewed by 05/17/2016 Coppage   Final    TB Ag T-Cell Stimulation 05/17/2016 .   Final    Amphetamine,UR 05/17/2016 NEG   Final    Comment: 1000 ng/mL  Reference Ranges are Screening Cutoff Values.      Cocaine/Metab,UR 05/17/2016 NEG   Final    Comment: 300 ng/mL  Reference Ranges are Screening Cutoff Values.      Opiates,UR 05/17/2016 NEG   Final    Comment: 300 ng/mL  Reference Ranges are Screening Cutoff Values.      Oxycodone/Oxymorphone,UR 05/17/2016 NEG   Final    Comment: 100 ng/mL  Reference Ranges are Screening Cutoff Values.      THC Metabolite,UR 05/17/2016 POS  Final    Comment: 50 ng/mL  PRELIMINARY RESULT ONLY - SEE CONFIRMATION  Reference Ranges are Screening Cutoff Values.      Benzodiazepinen,UR 05/17/2016 NEG   Final    Comment: 200 ng/mL  Reference Ranges are Screening Cutoff Values.      Color, UA 05/17/2016 Yellow  Yellow Final    Appearance,UR 05/17/2016 1+ Cloudy* Clear Final    Specific Gravity,UA 05/17/2016 1.010  1.002 - 1.030 Final    Leuk Esterase,UA 05/17/2016 NEG  NEGATIVE Final    Nitrite,UA 05/17/2016 NEG  NEGATIVE Final    pH,UA 05/17/2016 6.0  5.0 - 8.0 Final    Protein,UA 05/17/2016 NEG  NEGATIVE mg/dL Final    Glucose,UA 05/17/2016 NORM  mg/dL Final    NORMAL    Ketones, UA 05/17/2016 NEG  NEGATIVE Final    Blood,UA 05/17/2016 NEG  NEGATIVE Final    Resolution 05/17/2016 see text   Corrected    Comment: PLATELET (PLT) PORTION OF CBC,PLT/DIFF Test not performed. Unable to  perform testing due to platelet clumping.    K,ALK PHOS,AST,ALT not performed Unable to perform testing due to  hemolysis.  Corrected Report, previous comment on 05/17/2016 at 16:49  VQQ:[id number] (PLT)  PORTION OF CBC,PLT/DIFF Test not performed. Unable to perform testing due to  platelet clumping.      Confirm THC Metab 05/17/2016 POS   Final    Comment: Marijuana Metabolite ([id number]-  carboxylic acid) Present (cut-off 15 ng/mL)  Methodology:  Liquid Chromatography-Tandem Mass Spectrometry  Test developed and characteristics determined by [facility]      Creatinine,UR 05/17/2016 92  20 - 300 mg/dL Final    Semi-Quant THC,UR 05/17/2016 220  ng/mL Final    Cut-off 10 ng/mL    THC/Creat Ratio 05/17/2016 239  ng/mg Final    THC = Delta 9-THC Carboxylic Acid Equivalents    HLA A57 Review 05/17/2016 FINALIZED   Final    Electronically signed by Juliene Pina L. Coppage, PhD HCLD(ABB)       Current Treatment Plan   Created/Updated On 02/21/2016   Next Treatment Plan Due 08/22/2016       Assessment:  18 year AA male with a dx of MDD with psychotic features and In in-adequate symptom control on current medication regimen.      Plan and Rationale:  Increase HS Zyprexa by 29m.  In 7 days increase Prazosin to 246mQHS.  New new lab orders, though he has fasting pending labs from 08/14/16 that need to be done.  F/U per ACT team recommendations.    Current Outpatient Prescriptions:     OLANZapine (ZYPREXA) 5 MG tablet, Take 1 tablet (5 mg total) by mouth every morning, Disp: 30 tablet, Rfl: 5    OLANZapine (ZYPREXA) 15 MG tablet, Take 1 tablet (15 mg total) by mouth nightly, Disp: 30 tablet, Rfl: 5    [START ON 09/12/2016] prazosin (MINIPRESS) 1 MG capsule, Take 2 capsules (2 mg total) by mouth nightly, Disp: 60 capsule, Rfl: 5    efavirenz-emtrictabine-tenofovir (ATRIPLA) 600-200-300 MG per tablet, Take 1 tablet by mouth nightly on an empty stomach, Disp: 30 tablet, Rfl: 3    risperiDONE (RISPERDAL) 0.5 MG tablet, Take 1 tablet (0.5 mg total) by mouth daily, Disp: 30 tablet, Rfl: 5    mirtazapine (REMERON) 15 MG tablet, Take 1 tablet (15 mg total) by mouth nightly, Disp:  30 tablet, Rfl: 1    dronabinol (MARINOL) 5 MG capsule, Take 1 capsule (5 mg total) by mouth 2  times daily (before meals)   Max daily dose: 10 mg, Disp: 60 capsule, Rfl: 5    gabapentin (NEURONTIN) 100MG capsule, Take 1 capsule by mouth 3 times daily for 3 days, then take 2 capsules by mouth 3 times daily., Disp: 180 capsule, Rfl: 2    baclofen (LIORESAL) 10 MG tablet, Take 1 tablet (10 mg total) by mouth 2 times daily as needed for Muscle spasms, Disp: 60 tablet, Rfl: 1    lidocaine (XYLOCAINE) 5 % ointment, Apply topically 3 times daily as needed for Pain (neck stiffness and pain)   Apply to top of right foot and small toes, Disp: 50 g, Rfl: 2    HYDROcodone-acetaminophen (NORCO) 5-325 MG per tablet, Take 1 tablet by mouth every 8 hours as needed for Pain   Max daily dose: 3 tablets, Disp: 30 tablet, Rfl: 0    terbinafine (LAMISIL) 1 % cream, Apply topically 2 times daily, Disp: 42 g, Rfl: 1    omeprazole (PRILOSEC) 20 MG capsule, Take 1 capsule (20 mg total) by mouth nightly, Disp: 30 capsule, Rfl: 5    nicotine polacrilex (COMMIT) 4 MG lozenge, Place 1 lozenge (4 mg total) inside cheek as needed for Smoking cessation   Max daily dose: 20 lozenges No more than 5 pieces in 6hrs., Disp: 108 tablet, Rfl: 2    Greater than 50% of visit time spent counseling/coordinating care. Time spent counseling:  15 minutes

## 2016-09-06 ENCOUNTER — Ambulatory Visit: Payer: Self-pay

## 2016-09-10 ENCOUNTER — Ambulatory Visit: Payer: Self-pay

## 2016-09-10 DIAGNOSIS — F323 Major depressive disorder, single episode, severe with psychotic features: Secondary | ICD-10-CM

## 2016-09-13 ENCOUNTER — Ambulatory Visit: Payer: Self-pay

## 2016-09-13 NOTE — Progress Notes (Signed)
Behavioral Health Progress Note     LENGTH OF SESSION: 15 minutes    Contact Type:  Location: Off Site    Collateral Contact     Problem(s)/Goals Addressed from Treatment Plan:    Problem 1:   Treatment Problem #1 02/21/2016   Patient Identified Problem unstable housing       Goal for this problem:    Treatment Goal #1 02/21/2016   Patient Identified Goal "I will get my own apartment asap".         Progress towards this goal: Problem resolving. Comment: Pt working towards objective of completing [facility]         Session Content::  Wtr went to pt's mother's house as pt asked wtr to do so to meet. Wtr went to the back door and went onto the porch and knocked the 2nd door. Household members were heard discussing why there was someone on their porch. A household member stated, "Why hasn't anyone answered the damn door yet?". [name] came to the door. Wtr introduced herself to pt's mother whom wtr had talked to on the phone before. [name] introduced herself and greeted wtr. [name] stated pt was not home and wtr asked if it was okay to leave a bus pass for him there. [name] agreed. Wtr asked pt's mother how Derek SextonRalph was doing and pt's mother stated that he was "doing alright". Wtr wished pt's mother a "Happy New Year" and pt's mother returned the greeting. Wtr left.        Plan:  Psychotherapy continues as described in care plan; plan remains the same.    NEXT APPT: 09/17/16      Alinda MoneyShannon Gus Littler, LMSW

## 2016-09-13 NOTE — BH Treatment Plan (Addendum)
[facility] Treatment Plan     Date of Plan:      Created/Updated On 09/02/2016   FROM 09/02/2016     Created/Updated On 09/02/2016   Next Treatment Plan Due 03/03/2017     Diagnostic Impression  Major psychotic depression, recurrent - Primary ICD-10-CM: F33.3  ICD-9-CM: 296.34         Strengths  Strengths derived from the assessment include: Derek SextonRalph is an intelligent man who is motivated to reduce his suffering. At times this includes but is not limited to self-care. He has been somewhat consistent when he knows he will be meeting with that individual. He is connected with the mother of his children and has supportive family in the community. Pt identifies that some of his other strengths include, "good listener, fast-learner, able to comprehend and apply knowledge, patient, positive family relationships".     Problem Areas  *At least one problem must be targeted toward risk reduction if Formulation of Risk or any other previous exam indicated special monitoring or intervention for suicide and/or violence risk indicated.    PROBLEM AREAS (choose and describe relevant):  THOUGHT: Negative Rumination, "making the situation worse than what it is"  MOOD:Martrell has a diagnosis of depression and his alcohol and drug use exasperate his mood fluctuations.   BEHAVIOR: "At times, I can have a sailor's mouth and be very outspoken when having conflict with others, the way I present it is a problem- I need to work on that"  HOUSING:"Would like to be out on my own", pt has a DHS sanction, if lifted pt would like to live in his own apartment to maintain a drug/alc-free environment  ECONOMIC:Alcohol and Cannabis use  ________________________________________________________________     Treatment Problem #1 09/02/2016   Patient Identified Problem Alc/drug use at current residence     Treatment Goal #1 09/02/2016   Patient Identified Goal "I would like to be out on my own".         The rationale for addressing this problem  is that resolving it will (select all that apply):  Reduce symptoms of disorder, Reduce functional impairment associated with disorder, Decrease likelihood of hospitalization, Facilitate transfer skills learned in therapy to everday life, Is a key motivational factor for the patient's participation in treatment and Reduce risk for suicide      Progress toward goal(s): N/A - New goal    1 a. Measurable Objectives : I [name]   Date established: 09/02/16   Target date: 03/03/17   Attained or Revised? new    1 b. Measurable Objectives : I will work on lifting DHS sanction so that funds are available for housing   Date established: 09/02/16   Target date: 09/20/16   Attained or Revised? new    1 c. Measurable Objectives : I will meet with ACT staff at least twice monthly to discuss and receive assistance in searching for safe and affordable housing, scheduling tours, making phone calls, completing applications, etc. As demonstrated by Derek Sextonalph having a safe and affordable place to live in the next six months   Date established: 09/02/16    Target date: 02/2017   Attained or Revised? continue    Plan  TREATMENT MODALITIES:    Individual psychotherapy for 20-60 min 6 times per month weeks with ACT Team members; A minimum of 5 of those visits in the home or community.    Psychopharmacology visits monthly with NP and quarterly  with ACT Team Psychiatrist     [name] Walker is encouraged to attend group psychotherapy as offered through ACT (but is currently (as of 09/02/16) [facility])    ACT Team members will provide symptom monitoring as needed during our visits, as well as providing psychoeducation.    ACT staff will provide crisis management if needed and [name] Walker has access to ACT 24/7 via the emergency phone line.    [employer] Funds may be used to increase engagement and support [place]'s   financial needs as seen fit by the discretion of the ACT Team collaboratively.    Interventions:      ACT staff will provide assessment of mental status and medication response, and lethality assesssment at each visit   ACT staff will provide crisis intervention via 1:1 response with on call services, utilizing safety plan, lethality assessment, and medication adjustments as needed.   ACT staff will provide [employer] Services to Ralphfor emergency and necessary personal needs on an as needed basis and when appropriate.    ACT staff will engageRalph with individual treatment and outreach via Reflective Listening, Motivational Interviewing, Amplified Reflection, Double Sided Reflection,or Person Centered Counseling at least once per month to five times per month.    ACT staff will provide monthly Psychopharmcology contacts with an NP or MD, with no more than three months between MD contacts.   ACT staff may utilize [employer] funding for additional resources related to attaining [name]'s goal of attaining his own safe and affordable housing, as requested, and when appropriate during this treatment period.    ACT staff will provide resources such as bus passes, or transportation with cab services, or direct ACT transport to Chemical Dependency tx   ACT staff will provide general resources or additional contacts for landlords in the community, via lists, references, or assistance with transportation to view locations as requested, or as little as once per month, during this treatment period.    ACT staff will prompt Derek SextonRalph to report if there has been any barrier to maintaining drug/alc abstinence, and if there are additional resources he could use, or counseling support if barriers are related to substance use and relapse, at least once a week during this treatment period.    ACT staff will encourage Derek SextonRalph to maintain connection to his required services, including his substance use program, his probation officer,  and any other required agency at least once per month during this treatment period.           DISCHARGE CRITERIA for this treatment setting: Derek SextonRalph will be able to be treated by a lower level of care when he has the necessary natural supports in place in the community and he is engaged in medical treatment with consistency.    Clinician's name: Alinda MoneyShannon Maham Quintin    Psychiatrist's Name: Dr. Maxie Betterobert Weisman, DO    Supervisor's Name: Tera HelperMaria Romana, PMHNP-BC     Patient/Family Statement  PATIENT/FAMILY STATEMENT:  Obtain patient and family input into the treatment plan, including areas of agreement / disagreement.  Obtain patient's signature - if not possible, briefly describe the reason.     Patient Comments: none          I HAVE PARTICIPATED IN THE DEVELOPMENT OF THIS TREATMENT PLAN AND I AGREE WITH ITS CONTENTS:       Patient Signature: _______________________________________________________    Date: ____12/18/17__________________________

## 2016-09-17 ENCOUNTER — Ambulatory Visit: Payer: Self-pay

## 2016-09-17 NOTE — Progress Notes (Signed)
Behavioral Health Progress Note     LENGTH OF SESSION: 15 minutes    Contact Type:  Location: Off Site    Face to Face     Problem(s)/Goals Addressed from Treatment Plan:    Problem 1:   Date of Plan: 09/02/2016    Treatment Problem #1 09/02/2016   Patient Identified Problem Derek Walker at current residence     Treatment Goal #1 09/02/2016   Patient Identified Goal "I would like to be out on my own".         Progress towards this goal: Pt is currently fulfilling objective 1a. "[facility]" for Treatment Problem #1/Patient Identified goal.      Mental Status Exam:  APPEARANCE: Casual, Disheveled  ATTITUDE TOWARD INTERVIEWER: Cooperative  MOTOR ACTIVITY: WNL (within normal limits)  EYE CONTACT: Indirect  SPEECH: Slowed  AFFECT: Pleasant  MOOD: Anxious  THOUGHT PROCESS: Circumstantial  THOUGHT CONTENT: Preoccupations  PERCEPTION: Within normal limits  CURRENT SUICIDAL IDEATION: patient denies  CURRENT HOMICIDAL IDEATION: Patient denies  ORIENTATION: Alert and Oriented X 3.  CONCENTRATION: WNL  MEMORY:   Recent: intact   Remote: intact  COGNITIVE FUNCTION: Average intelligence  JUDGMENT: Intact  IMPULSE CONTROL: Fair  INSIGHT: Fair    Risk Assessment:  ASSESSMENT OF RISK FOR SUICIDAL BEHAVIOR  Changes in risk for suicide from baseline Formulation of Risk and/or previous intake, including newly identified risk, if any: none      Session Content::  Wtr went to pt's mother's residence and called pt outside as porch door was latched and wtr did not think that pt could hear knock otherwise. Pt answered and stated that he would meet wtr in the wtr's vehicle. Wtr waited in vehicle and pt got into vehicle moments later and explained that several of pt's nephews and nieces were at his mother's house and it was too crowded to meet indoors. Wtr agreed that made sense. Pt reported slipping and falling on pt's mother's door step while helping her bring in  groceries. Pt stated, "all those boys in there and they won't get out here and do nothing", stating that the nephews inside were expected to shovel and salt pt's mother's walkway. Pt stated that he hit the right side of his face as well. Pt stated that his mother gave him two tylenol and he is "laying across the couch". Pt stated that he did attend [facility] chemical dependency tx group despite his reported injury. Pt asked wtr if wtr would bring pt to [facility] to get bloodwork done 09/18/16 and wtr agreed. Pt stated that he would like to rest this evening and if necessary would ask wtr to bring pt to his PCP at [facility] if the reported injury's pain persists. Wtr agreed. Wtr delivered 2 bus passes to pt for pt to utilize to attend chemical dependency tx groups on  09/18/16 & 09/19/16. Pt received. Wtr and pt said goodbyes and wtr left.    Visit Diagnosis:    Major psychotic depression, recurrent- Primary ICD-10-CM: F33.3  ICD-9-CM: 296.34         Interventions:  Continued to develop therapeutic relationship  Problem Solving Therapy skills  Provided therapeutic support during discussion of distressing/traumatic events and/or symptoms  Supportive Psychotherapy    Current Treatment Plan     Created/Updated On 09/02/2016   FROM 09/02/2016     Created/Updated On 09/02/2016   Next Treatment Plan Due 03/03/2017  Plan:  Psychotherapy continues as described in care plan; plan remains the same. and Other planned interventions or recommendations: [facility] to complete [facility] and to [facility] if necessary to address reported injury.    NEXT APPT: 09/18/2016      Derek Walker, LMSW

## 2016-09-18 ENCOUNTER — Ambulatory Visit: Payer: Self-pay

## 2016-09-18 NOTE — Progress Notes (Signed)
[facility] MISSED/CANCELLED APPOINTMENT     Name: Derek NielsenRALPH D Kunkler  MRN: [id number]   DOB: [birth year]    Date of Scheduled Service: 09/18/16    Mr. Beswick was a no show for today's appointment.  I have left the patient a message to contact me.    Additional Information:    Not applicable     Alinda MoneyShannon Brydan Downard, LMSW

## 2016-09-20 NOTE — Progress Notes (Signed)
Behavioral Health Progress Note     LENGTH OF SESSION: 20 minutes    Contact Type:  Location: Off Site    Face to Face     Problem(s)/Goals Addressed from Treatment Plan:    Problem 1:   Treatment Problem #1 02/21/2016   Patient Identified Problem unstable housing       Goal for this problem:    Treatment Goal #1 02/21/2016   Patient Identified Goal "I will get my own apartment asap".         Progress towards this goal: No change      Mental Status Exam:  APPEARANCE: Casual  ATTITUDE TOWARD INTERVIEWER: Cooperative  MOTOR ACTIVITY: WNL (within normal limits)  EYE CONTACT: Direct  SPEECH: Normal rate and tone  AFFECT: Full Range  MOOD: Normal  THOUGHT PROCESS: Normal  THOUGHT CONTENT: No unusual themes  PERCEPTION: Within normal limits  CURRENT SUICIDAL IDEATION: patient denies  CURRENT HOMICIDAL IDEATION: Patient denies  ORIENTATION: Alert and Oriented X 3.  CONCENTRATION: WNL  MEMORY:   Recent: intact   Remote: intact  COGNITIVE FUNCTION: Average intelligence  JUDGMENT: Intact  IMPULSE CONTROL: Fair  INSIGHT: Fair    Risk Assessment:  ASSESSMENT OF RISK FOR SUICIDAL BEHAVIOR  Changes in risk for suicide from baseline Formulation of Risk and/or previous intake, including newly identified risk, if any: none      Session Content::  Writer met with client this date for a scheduled home visit and engagement.  Derek Walker noted that he was feeling good with improved mood.  He is not endorsing any SI/HI thoughts at the present time.  He is not drinking at the present time and recognized that his mood was impacted at last ACT contact due to the fact that he had been under the influence.  He does note, multiple stressors with most recent being that his daughter's mother threw out his belongings and in addition, he lost his glasses that were in his coat pocket at the time.  He denies any specific precipitating events resulting in her behavior.  He also notes feeling ambivalent about his current legal status and is considering doing  the jail time.  He is currently attending [address] [facility] and 3x weekly and writer provides him with a bus pass to attend tomorrow's group.  He further reports a need for clothing and Probation officer calls [employer] with client and learns that they will be closed until after the new year, per recording.  ACT staff to follow-up at that time. Derek Walker has an eye doctor appointment at [facility] on [address] at 12:00.  There are no further needs at this time.      Visit Diagnosis:    ICD-10-CM ICD-9-CM   1. Severe major depression with psychotic features F32.3 296.24       Interventions:  Wellness check    Current Treatment Plan   Created/Updated On 02/21/2016   Next Treatment Plan Due 08/22/2016         Plan:  Psychotherapy continues as described in care plan; plan remains the same.    NEXT APPT: 09/12/16      Maryjane Hurter, LCSW

## 2016-09-23 ENCOUNTER — Ambulatory Visit: Payer: Self-pay | Admitting: Psychiatry

## 2016-09-24 ENCOUNTER — Ambulatory Visit: Payer: Self-pay

## 2016-09-25 ENCOUNTER — Ambulatory Visit: Payer: Self-pay | Admitting: Psychiatry

## 2016-09-25 NOTE — Progress Notes (Signed)
Clinical Management Note     This [name] called pt's mother and she reported that pt was not her residence, she did not report any acute concerns.   This [name] then called pt's child mother and there was no answer. This [name] then drove to  [employer] [employer] mothers house and she reported that he was not at home and did not identify any acute concerns either.  Will advise ACT team of this missed psychopharm community encounter effort.

## 2016-09-26 ENCOUNTER — Ambulatory Visit: Payer: Self-pay

## 2016-09-27 NOTE — Progress Notes (Signed)
Behavioral Health Progress Note     LENGTH OF SESSION: 60 minutes    Contact Type:  Location: Off Site    Face to Face     Problem(s)/Goals Addressed from Treatment Plan:    Problem 1:   Date of Plan: 09/02/2016    Treatment Problem #1 09/02/2016   Patient Identified Problem Alc/drug use at current residence     Treatment Goal #1 09/02/2016   Patient Identified Goal "I would like to be out on my own".        Progress towards this goal: Pt is currently fulfilling objective 1a. "I will [name]" for Treatment Problem #1/Patient Identified goal.      Mental Status Exam:  APPEARANCE: Well-groomed  ATTITUDE TOWARD INTERVIEWER: Cooperative  MOTOR ACTIVITY: WNL (within normal limits)  EYE CONTACT: Direct  SPEECH: Normal rate and tone  AFFECT: Full Range  MOOD: Anxious and Depressed  THOUGHT PROCESS: Circumstantial and Goal directed  THOUGHT CONTENT: Delusions:  Persecutory and Negative Rumination  PERCEPTION: Within normal limits  CURRENT SUICIDAL IDEATION: patient denies  CURRENT HOMICIDAL IDEATION: Patient denies  ORIENTATION: Alert and Oriented X 3.  CONCENTRATION: WNL  MEMORY:   Recent: intact   Remote: intact  COGNITIVE FUNCTION: Average intelligence  JUDGMENT: Impaired -  minimal  IMPULSE CONTROL: Fair  INSIGHT: Fair    Risk Assessment:  ASSESSMENT OF RISK FOR SUICIDAL BEHAVIOR  Changes in risk for suicide from baseline Formulation of Risk and/or previous intake, including newly identified risk, if any: none      Session Content: Wtr arrived at [facility] on [address] to meet [place] as this location pt has identified as place that he is familiar and comfortable meeting in and otherwise can become triggered by stressors in Kazimierz's environment at his residence. Wtr waited approximately 10 minutes before calling pt in which Alverto stated that he was on his way and would arrive shortly. Pt arrived shortly thereafter and greeted wtr. Wtr and  Akira sat down and began reviewing pt's treatment plan and assessing pt's progress/challenges with [name]. Pt stated that he shared "more than one word for the first time today" which pt identified as progress. Wtr commended pt and asked what had inspired him to do so. Meldrick stated that he was feeling moderate-severely overwhelmed by daily stressors and others in the group appreciated him sharing and related to his experience. Wtr commended pt for sharing in group and validated pt's experience of feeling relieved after discussing stressors and being "heard".     Wtr reviewed with Michail that pt missed his previous appointment with wtr on 09/18/2016. Pt apologized and stated that he went out with his cousins but that it was stressful and stated "it always is, never a smooth ride with them". Wtr asked Archibald how the rest of his family was doing and pt reiterated feeling overwhelmed by family members. Toby recalled that he has tried teaching his sons about consequences in life but "they just won't listen". Pt stated, "All I can hear is the clang of those bars as they shut, trying to teach my kinds, but they can't learn it unless they were to experience it themselves". Merdith further reflected, "I remember holding my nephew (gesturing holding a baby), rushing home to feed him and who knew that he would grow up to be a monster, you've seen it on the news, with those kids kidnapped from [employer], you think  you know people, but you don't". Wtr validated Mykah's reflection and turned towards identified protective factors, asking if pt felt like he knew his youngest daughter, nicknamed "[name]", as pt has shown pride in his parenting of [name] and joy in [name]'s personality and character development at age 615. Pt was able to positively reflect on his father-daughter relationship with [name] but maintained thoughts of insecure attachment, stating, "You say you love someone, but  do you really?". Wtr acknowledged that interpersonal relationships are complex and difficult to navigate especially when feeling overwhelmed.     Wtr discussed the possibility of [name] living on his own. Pt shared that although that would reduce external stressors, that his internal stressors of paranoia are moderate-severe. Pt reflected that the last time he lived on his own he was unable to maintain doing so and abandoned his apartment as he found himself "checking the windows" obsessively and experiencing paranoia that "people were out to get me". Wtr acknowledged challenges of [name] living on his own in the past. Wtr stated that wtr and pt could take it one day at a time and focus on "first things first" referring to pt's chemical dependency treatment. Pt agreed. Wtr asked if there was anything else that wtr could do to help alleviate Damarrion's current stressors and pt stated, "This. This is very helpful to me. Thank you.". Wtr shared sentiment of positive regard and stated that wtr enjoyed working with pt. Lexander's "daughter's mother", [name], entered [employer] and pt stated, "Oh, you've decided to join me". [name] walked into [employer] presumably to meet with pt after wtr left. Wtr assured [name] that [facility] [address] [facility] 09/26/2016 as "this was about a year in the making" for pt's disability appeals court appearance and pt reported feeling anxious about this upcoming event. Wtr and [name] said goodbyes and wtr left.     Visit Diagnosis:    Major psychotic depression, recurrent- Primary ICD-10-CM: F33.3  ICD-9-CM: 296.34         Interventions:  Cognitive Processing Therapy  Continued to develop therapeutic relationship  Identified adaptive/maladaptive family patterns  Provided therapeutic support during discussion of distressing/traumatic events and/or symptoms  Reviewed and discussed Treatment Plan/Treatment Plan Review  Supportive Psychotherapy    Current Treatment Plan    Created/Updated On 02/21/2016   Next Treatment Plan Due 08/22/2016         Plan:  Psychotherapy continues as described in care plan; plan remains the same.    NEXT APPT: 09/26/2016, as per ACT calendar      Alinda MoneyShannon Majesty Stehlin, LMSW

## 2016-10-01 ENCOUNTER — Ambulatory Visit: Payer: Self-pay

## 2016-10-01 NOTE — Progress Notes (Signed)
Behavioral Health Progress Note     LENGTH OF SESSION: 120 minutes    Contact Type:  Location: Off Site    Collateral Contact, Face to Face     Problem(s)/Goals Addressed from Treatment Plan:    Treatment Problem #1 09/02/2016   Patient Identified Problem Alc/drug use at current residence     Treatment Goal #1 09/02/2016   Patient Identified Goal "I would like to be out on my own".         Progress towards this goal: No change. Comment: Pt is currently fulfilling objective 1a. "Iwill successfully complete [facility] Chemical Dependency treatment" for Treatment Problem #1/Patient Identified goal.      Mental Status Exam:  APPEARANCE: Well-groomed, Casual, Perspiring excessively  ATTITUDE TOWARD INTERVIEWER: Cooperative and Guarded  MOTOR ACTIVITY: WNL (within normal limits)  EYE CONTACT: Indirect  SPEECH: Soft and Minimal  AFFECT: Anxious  MOOD: Anxious  THOUGHT PROCESS: Circumstantial and Goal directed  THOUGHT CONTENT: No unusual themes  PERCEPTION: Within normal limits  CURRENT SUICIDAL IDEATION: patient denies  CURRENT HOMICIDAL IDEATION: Patient denies  ORIENTATION: Alert and Oriented X 3.  CONCENTRATION: Fair  MEMORY:   Recent: intact   Remote: intact  COGNITIVE FUNCTION: Average intelligence  JUDGMENT: Impaired -  minimal  IMPULSE CONTROL: Fair  INSIGHT: Fair    Risk Assessment:  ASSESSMENT OF RISK FOR SUICIDAL BEHAVIOR  Changes in risk for suicide from baseline Formulation of Risk and/or previous intake, including newly identified risk, if any: none      Session Content::  Wtr arrived at [employer] and went through security. Wtr was directed to the 4th floor of [employer]. Wtr had to turn-off phone and turn-in to security. Wtr went to 4th floor and spoke to pre-trial intake/orientation coordinator who directed wtr to pt who had just began the pre-trial intake/orientation process. Wtr greeted pt who appeared severely anxious as pt was visibly shaking and sweating  profusely with beads of sweat dripping down his forehead. Wtr checked-in with pt in a moment of privacy from intake/orientation coordinator and asked how pt was feeling. Derek Walker stated that he was very anxious for the appeals court process and stated he was glad that wtr was there. Wtr suggested wtr and pt pause for a moment and "take a couple of deep breaths" guiding pt through deep breathing skills. Derek Walker received well. Intake/orientaiton coordinator asked if wtr would like to go through Derek Walker's records with him since wtr was present. Wtr asked Derek Walker if he would prefer this and Derek Walker stated that he would. Wtr and Derek Walker went through his records consisting of pt's medical and psychiatric history. Intake/orientation coordinator told pt it was time for court to begin and wtr was told to wait in waiting area. Moments later, wtr was welcomed into the court room.     Pt was seen for Disability Appeals Court and wtr helped pt navigate the court proceedings. Wtr transported pt home afterward.    Visit Diagnosis:    Major psychotic depression, recurrent- Primary ICD-10-CM: F33.3  ICD-9-CM: 296.34         Interventions:  Cognitive Processing Therapy  Collateral contact with:  Disability Appeals Court Sharee Pimple  Continued to develop therapeutic relationship  Provided therapeutic support during discussion of distressing/traumatic events and/or symptoms  Supportive Psychotherapy    Current Treatment Plan   Created/Updated On 02/21/2016   Next Treatment Plan Due 08/22/2016         Plan:  Psychotherapy continues as described  in care plan; plan remains the same.    NEXT APPT: 10/01/16, as per ACT calendar      Alinda MoneyShannon Awais Cobarrubias, LMSW

## 2016-10-01 NOTE — Progress Notes (Signed)
Behavioral Health Progress Note     LENGTH OF SESSION: 30 minutes    Contact Type:  Location: Off Site    Collateral Contact, Face to Face     Problem(s)/Goals Addressed from Treatment Plan:    Problem 1:   Treatment Problem #1 09/02/2016   Patient Identified Problem Alc/drug use at current residence     Treatment Goal #1 09/02/2016   Patient Identified Goal "I would like to be out on my own".         Progress towards this goal: No change. Comment: Pt is currently working on addressing 1 a. and 1 b. measurable objectives. Pt has begun his 4th week attending [facility] for Chemical Dependency treatment. Wtr and pt called DHS and were informed that [place] has a sanction on his temporary assistance that cannot be lifted until the 6-mos mark in February 2018 or if pt attends inpatient chemical dependency treatment which pt has stated he is not interested in at this time. 1 a. Measurable Objectives : I will successfully complete [facility] Chemical Dependency treatment, 1 b. Measurable Objectives : I will work on lifting DHS sanction so that funds are available for housing      Mental Status Exam:  APPEARANCE: Well-groomed, Casual  ATTITUDE TOWARD INTERVIEWER: Cooperative  MOTOR ACTIVITY: WNL (within normal limits)  EYE CONTACT: Direct  SPEECH: Normal rate and tone  AFFECT: Full Range  MOOD: Anxious  THOUGHT PROCESS: Circumstantial and Goal directed  THOUGHT CONTENT: No unusual themes  PERCEPTION: Within normal limits  CURRENT SUICIDAL IDEATION: patient denies  CURRENT HOMICIDAL IDEATION: Patient denies  ORIENTATION: Alert and Oriented X 3.  CONCENTRATION: WNL  MEMORY:   Recent: intact   Remote: intact  COGNITIVE FUNCTION: Average intelligence  JUDGMENT: Impaired -  minimal  IMPULSE CONTROL: Fair  INSIGHT: Fair    Risk Assessment:  ASSESSMENT OF RISK FOR SUICIDAL BEHAVIOR  Changes in risk for suicide from baseline Formulation of Risk and/or previous intake, including newly identified  risk, if any: none      Session Content::  Wtr arrived at [facility] and waited for pt to finish group. Undray appeared and stood in the sign-out line and wtr greeted pt. Wtr asked Elhadj if wtr could sign a release form with [place] and pt agreed. Pt introduced wtr to pt's counselor, Joslyn Devon, [place], and Leanne said she was able to sign release form quickly with wtr and pt. Leanne stated that there was already a release form on file from when pt completed intake and orientation. Wtr explained that one day wtr came to get pt and pt was not there and wtr asked [facility] staff if pt had come to group and left already or had not come at all and staff did not look in chart to see that wtr already had a signed release with agency. Wtr and pt thanked Psychologist, sport and exercise and headed over to [employer].     Wtr and pt went up to [employer] and realized that [employer] was not open for another 15-20 minutes. Wtr and pt met in wtr's vehicle and wtr informed pt that pt had referall to [employer] to their clothing closet on 11/07/16. Pt stated, "That's my birthday". Wtr asked if that was okay and pt agreed. Wtr stated that they could get a treat that day to celebrate Keyshon's birthday and Glennon looked pleased and agreed. Pt asked wtr about sanction on [facility]  temporary assistance since pt had been attending chemical dependency treatment. Wtr and pt called DHS and were informed that unless pt was active in an inpatient chemical dependency treatment that pt would have to wait until sanction expiration in 10/2016. Pt received well.     Wtr asked if there was anything else that pt needed. Pt stated that he would need a medication refill by the end of the week and wtr let pt know that one of the ACT Team's providers would be coming by for a psychopharmacology appointment on Thursday, 10/03/16. Pt stated that 11:30am would be best so he would have time to get home from his [facility] chemical dependency tx group. Wtr let Antwan know that wtr would inform NP. Pt was appreciative. Wtr transported Ryley to "daughter's mother's house" so that pt could complete laundry. Wtr extended to Keeven that if he needed help picking up his medications from the pharmacy to let wtr know and wtr would be happy to transport him, as pt has had difficulty picking up his medications in the past due to lack of transportation and symptoms of anxiety and depression. Pt was appreciative and stated that he would ask wtr for help if he needed it. Wtr and pt said goodbyes and wtr left.    Visit Diagnosis:    Major psychotic depression, recurrent- Primary ICD-10-CM: F33.3  ICD-9-CM: 296.34       Interventions:  Cognitive Processing Therapy  Collateral contact with:  Leanne, group couselor at [facility]  Continued to develop therapeutic relationship  Referral to community organization: [employer] for access to clothing donations as pt has identified that he does not have adequate amount of clothing    Current Treatment Plan   Created/Updated On 02/21/2016   Next Treatment Plan Due 08/22/2016         Plan:  Psychotherapy continues as described in care plan; plan remains the same.    NEXT APPT: 10/03/16, Psychopharmacology appointment as per ACT calendar      Danise Edge, LMSW

## 2016-10-03 ENCOUNTER — Ambulatory Visit: Payer: Self-pay | Admitting: Psychiatry

## 2016-10-03 DIAGNOSIS — F323 Major depressive disorder, single episode, severe with psychotic features: Secondary | ICD-10-CM

## 2016-10-03 MED ORDER — PRAZOSIN HCL 1 MG PO CAPS *I*
3.0000 mg | ORAL_CAPSULE | Freq: Every evening | ORAL | 5 refills | Status: DC
Start: 2016-10-03 — End: 2016-11-28

## 2016-10-03 NOTE — Progress Notes (Signed)
Behavioral Health Psychopharmacology Follow-up     Length of Session: 15 minutes.    Diagnosis Addressed    ICD-10-CM ICD-9-CM   1. Severe major depression with psychotic features F32.3 296.24       Chief Complaint: Patient states anxiety is fluctuating    There were no vitals taken for this visit.  Wt Readings from Last 3 Encounters:   05/17/16 70.7 kg (155 lb 13.8 oz)   10/11/15 74.9 kg (165 lb 1.6 oz)   09/21/15 74.4 kg (164 lb)       Recent History and Response to Medications  Pt is seen at his residence.  He is pleasant and cooperative.  Pt reports that his depression is in good control though his anxiety is fluctuating mostly from the stressors caused by his step children.On 10/09/16 he anticipates getting 3 years probation.  Reports poor appetite and energy and states his sleep is good.  He goes to CD tx 4 times a week and finds that helpful.  Except for Cd tx he spends most of his time watching TV.  His nightmares are persisting.  Not feeling hopeless or helpless.  No SI/HI.    Denies Hallucinations.      Current use of alcohol or drugs: no    ENERGY: Poor  SLEEP: Normal.    APPETITE: Poor  WEIGHT: Not assessed at this time  SEXUAL FUNCTION: not asked  ENJOYMENT/INTEREST: Fair, Poor    Review of Systems:  Pertinent items are noted in HPI.    Current Medications  Current Outpatient Prescriptions   Medication Sig    OLANZapine (ZYPREXA) 5 MG tablet Take 1 tablet (5 mg total) by mouth every morning    OLANZapine (ZYPREXA) 15 MG tablet Take 1 tablet (15 mg total) by mouth nightly    prazosin (MINIPRESS) 1 MG capsule Take 2 capsules (2 mg total) by mouth nightly    efavirenz-emtrictabine-tenofovir (ATRIPLA) 600-200-300 MG per tablet Take 1 tablet by mouth nightly on an empty stomach    risperiDONE (RISPERDAL) 0.5 MG tablet Take 1 tablet (0.5 mg total) by mouth daily    mirtazapine (REMERON) 15 MG tablet Take 1 tablet (15 mg total) by mouth nightly    dronabinol (MARINOL) 5 MG capsule Take 1 capsule (5 mg  total) by mouth 2 times daily (before meals)   Max daily dose: 10 mg    gabapentin (NEURONTIN) 100MG capsule Take 1 capsule by mouth 3 times daily for 3 days, then take 2 capsules by mouth 3 times daily.    baclofen (LIORESAL) 10 MG tablet Take 1 tablet (10 mg total) by mouth 2 times daily as needed for Muscle spasms    lidocaine (XYLOCAINE) 5 % ointment Apply topically 3 times daily as needed for Pain (neck stiffness and pain)   Apply to top of right foot and small toes    HYDROcodone-acetaminophen (NORCO) 5-325 MG per tablet Take 1 tablet by mouth every 8 hours as needed for Pain   Max daily dose: 3 tablets    terbinafine (LAMISIL) 1 % cream Apply topically 2 times daily    omeprazole (PRILOSEC) 20 MG capsule Take 1 capsule (20 mg total) by mouth nightly    nicotine polacrilex (COMMIT) 4 MG lozenge Place 1 lozenge (4 mg total) inside cheek as needed for Smoking cessation   Max daily dose: 20 lozenges No more than 5 pieces in 6hrs.     No current facility-administered medications for this visit.  Side Effects  None    Mental Status  APPEARANCE: Appears stated age  ATTITUDE TOWARD INTERVIEWER: Cooperative  MOTOR ACTIVITY: WNL (within normal limits)  EYE CONTACT: Direct  SPEECH: Normal rate and tone  AFFECT: Full Range  MOOD: Euthymic  THOUGHT PROCESS: Normal  THOUGHT CONTENT: No unusual themes  PERCEPTION: Within normal limits and No evidence of hallucinations  ORIENTATION: Alert and Oriented X 3.  CONCENTRATION: WNL  MEMORY:   Recent: intact   Remote: intact  COGNITIVE FUNCTION: Average intelligence  JUDGMENT: Intact  IMPULSE CONTROL: Good  INSIGHT: Fair    Risk Assessment  Self Injury: Patient Denies  Suicidal Ideation: Patient Denies  Homicidal Ideation: Patient Denies  Aggressive Behavior: Patient Denies    If any of the answers above are Yes, is there access to lethal means?   N/A    [place] Scale administered? N/A    Results  All labs in the last 3 months:  No visits with results within 3  Month(s) from this visit.  Latest known visit with results is:    Office Visit on 05/17/2016   Component Date Value Ref Range Status    Cholesterol 05/17/2016 197  mg/dL Final    Comment: REFERENCE RANGE:  < 200 Desirable                  200-239 Borderline High                    > 240 High      Triglycerides 05/17/2016 65  mg/dL Final    Comment: REFERENCE RANGE:  < 150 Normal                  150-199 Borderline High                  200-499 High                    > 500 Very High      HDL 05/17/2016 102  mg/dL Final    Comment: REFERENCE RANGE:  < 40 Low                    > 60 High      LDL Calculated 05/17/2016 82  mg/dL Final    Comment: REFERENCE RANGE:  < 100 Optimal                  100-129 Near or above optimal                  130-159 Borderline High                  160-189 High                    > 189 Very High      Non HDL Cholesterol 05/17/2016 95  mg/dL Final    Target is 49m/dl above(or over) LDL goal    Chol/HDL Ratio 05/17/2016 1.9   Final    Hemoglobin A1C 05/17/2016 5.2  4.0 - 6.0 % Final                                  NON-DIABETIC    Syphilis Screen 05/17/2016 Neg   Final    Test Method: CMIA    Syphilis Status 05/17/2016 Nonreact   Final    HIV-1 PCR  log 05/17/2016 NEG  copies/mL Final    HIV-1 PCR 05/17/2016 NEG  copies/mL Final    Comment: Lower Limit of Detection: 20 copies/mL  Dynamic Range: 1.3 - 7.0 log copies/mL  Dynamic Range: 20 - 10,000,000 copies/mL    Accurate quantification not possible at < 20 copies/mL.  Specimens in the 1-19 copies/mL range are reported qualitatively  as POS<20.    This specimen was tested using the Roche COBAS Ampliprep COBAS Taqman HIV-1  version 2.0 test.      WBC 05/17/2016 8.5  4.2 - 9.1 THOU/uL Final    RBC 05/17/2016 4.6  4.6 - 6.1 MIL/uL Final    Hemoglobin 05/17/2016 15.8  13.7 - 17.5 g/dL Final    Hematocrit 05/17/2016 46  40 - 51 % Final    MCV 05/17/2016 100* 79 - 92 fL Final    MCH 05/17/2016 34* 26 - 32 pg/cell Final    MCHC  05/17/2016 34  32 - 37 g/dL Final    RDW 05/17/2016 11.7  11.6 - 14.4 % Final    Platelets 05/17/2016 CANCELED  150 - 330 THOU/uL Final    Comment: Adequate but clumped    Result canceled by the ancillary      Seg Neut % 05/17/2016 76.9  % Final    Lymphocyte % 05/17/2016 13.3  % Final    Monocyte % 05/17/2016 7.4  % Final    Eosinophil % 05/17/2016 1.4  % Final    Basophil % 05/17/2016 0.6  % Final    Neut # K/uL 05/17/2016 6.6* 1.8 - 5.4 THOU/uL Final    Lymph # K/uL 05/17/2016 1.1* 1.3 - 3.6 THOU/uL Final    Mono # K/uL 05/17/2016 0.6  0.3 - 0.8 THOU/uL Final    Eos # K/uL 05/17/2016 0.1  0.0 - 0.5 THOU/uL Final    Baso # K/uL 05/17/2016 0.1  0.0 - 0.1 THOU/uL Final    Nucl RBC % 05/17/2016 0.1  0.0 - 0.2 /100 WBC Final    Nucl RBC # K/uL 05/17/2016 0.0  0.0 - 0.0 THOU/uL Final    IMM Granulocytes # 05/17/2016 0.0  0.0 - 0.1 THOU/uL Final    IMM Granulocytes 05/17/2016 0.4  % Final    Sodium 05/17/2016 138  133 - 145 mmol/L Final    Potassium 05/17/2016 CANCELED  3.3 - 5.1 mmol/L Final    Comment: Hemolyzed    Result canceled by the ancillary      Chloride 05/17/2016 99  96 - 108 mmol/L Final    CO2 05/17/2016 20  20 - 28 mmol/L Final    Anion Gap 05/17/2016 19* 7 - 16 Final    UN 05/17/2016 4* 6 - 20 mg/dL Final    Creatinine 05/17/2016 0.68  0.67 - 1.17 mg/dL Final    GFR,Caucasian 05/17/2016 119  * Final    GFR,Black 05/17/2016 138  * Final    *UNITS=mL/min/1.73 square meters    Glucose 05/17/2016 72  60 - 99 mg/dL Final    Comment: Reference Ranges apply only to FASTING samples.    ADA Guidelines Blood Sugar Levels for Diagnosing Diabetes & Pre-diabetes  Normal: < 100 mg/dL  Impaired Fasting Glucose (IFG): 100-125 mg/dL  Diabetes:  > 126 mg/dL on two different occasions      Calcium 05/17/2016 9.6  9.0 - 10.3 mg/dL Final    Total Protein 05/17/2016 7.7  6.3 - 7.7 g/dL Final    Albumin 05/17/2016 4.3  3.5 - 5.2 g/dL  Final    Bilirubin,Total 05/17/2016 0.4  0.0 - 1.2 mg/dL Final     AST 05/17/2016 CANCELED  0 - 50 U/L Final    Comment: Hemolyzed    Result canceled by the ancillary      ALT 05/17/2016 CANCELED  0 - 50 U/L Final    Comment: Hemolyzed    Result canceled by the ancillary      Alk Phos 05/17/2016 CANCELED  40 - 130 U/L Final    Comment: Hemolyzed    Result canceled by the ancillary      T LYM %(CD3) 05/17/2016 76  54 - 87 % Final    T LYM #(CD3) 05/17/2016 907  754 - 2810 cells/uL Final    CD4% 05/17/2016 22* 32 - 71 % Final    CD4# 05/17/2016 264* 496 - 2186 cells/uL Final    T Suppress %(CD8) 05/17/2016 49* 10 - 38 % Final    CD8# 05/17/2016 588  177 - 1137 cells/uL Final    CD4/CD8 05/17/2016 0.4* 0.7 - 3.0 Final    T LYM% (CD3+4+8+) 05/17/2016 1  % Final    T LYM# (CD3+4+8+) 05/17/2016 10  cells/uL Final    T LYM% (CD3+4-8-) 05/17/2016 6  % Final    T LYM# (CD3+4-8-) 05/17/2016 68  cells/uL Final    Interp,LMH 05/17/2016 see message   Final    Comment: Reference Range Note:  Pediatric reference values (0-16 years) taken from  [facility], Number [facility].  Adult ranges developed in-house in October, 2002.      HLA-B*57:01 05/17/2016 NEG   Final    HLA-B*57:10 05/17/2016 NEG   Final    HLA-B*57:08 05/17/2016 NEG   Final    Interp,HLA57 05/17/2016 see text   Final    Comment: Method: Sequence Specific Oligonucleotide (SSO)    This test was developed and its performance characteristics  determined by [facility].  It  has not been cleared or approved by the U.S. Food and Drug  Administration.  The FDA has determined that such clearance  or approval is not necessary for clinical use of this test.  The results should not be used as a sole diagnostic or  therapeutic criterion.      Reviewed by 05/17/2016 Coppage   Final    TB Ag T-Cell Stimulation 05/17/2016 .   Final    Amphetamine,UR 05/17/2016 NEG   Final    Comment: 1000 ng/mL  Reference Ranges are Screening Cutoff Values.      Cocaine/Metab,UR 05/17/2016 NEG    Final    Comment: 300 ng/mL  Reference Ranges are Screening Cutoff Values.      Opiates,UR 05/17/2016 NEG   Final    Comment: 300 ng/mL  Reference Ranges are Screening Cutoff Values.      Oxycodone/Oxymorphone,UR 05/17/2016 NEG   Final    Comment: 100 ng/mL  Reference Ranges are Screening Cutoff Values.      THC Metabolite,UR 05/17/2016 POS   Final    Comment: 50 ng/mL  PRELIMINARY RESULT ONLY - SEE CONFIRMATION  Reference Ranges are Screening Cutoff Values.      Benzodiazepinen,UR 05/17/2016 NEG   Final    Comment: 200 ng/mL  Reference Ranges are Screening Cutoff Values.      Color, UA 05/17/2016 Yellow  Yellow Final    Appearance,UR 05/17/2016 1+ Cloudy* Clear Final    Specific Gravity,UA 05/17/2016 1.010  1.002 - 1.030 Final    Leuk Esterase,UA 05/17/2016 NEG  NEGATIVE Final    Nitrite,UA 05/17/2016 NEG  NEGATIVE Final    pH,UA 05/17/2016 6.0  5.0 - 8.0 Final    Protein,UA 05/17/2016 NEG  NEGATIVE mg/dL Final    Glucose,UA 05/17/2016 NORM  mg/dL Final    NORMAL    Ketones, UA 05/17/2016 NEG  NEGATIVE Final    Blood,UA 05/17/2016 NEG  NEGATIVE Final    Resolution 05/17/2016 see text   Corrected    Comment: PLATELET (PLT) PORTION OF CBC,PLT/DIFF Test not performed. Unable to  perform testing due to platelet clumping.    K,ALK PHOS,AST,ALT not performed Unable to perform testing due to  hemolysis.  Corrected Report, previous comment on 05/17/2016 at 16:49 [id number] (PLT)  PORTION OF CBC,PLT/DIFF Test not performed. Unable to perform testing due to  platelet clumping.      Confirm THC Metab 05/17/2016 POS   Final    Comment: Marijuana Metabolite ([id number]-  carboxylic acid) Present (cut-off 15 ng/mL)  Methodology:  Liquid Chromatography-Tandem Mass Spectrometry  Test developed and characteristics determined by [facility]      Creatinine,UR 05/17/2016 92  20 - 300 mg/dL Final    Semi-Quant THC,UR 05/17/2016 220  ng/mL Final    Cut-off 10 ng/mL    THC/Creat  Ratio 05/17/2016 239  ng/mg Final    THC = Delta 9-THC Carboxylic Acid Equivalents    HLA A57 Review 05/17/2016 FINALIZED   Final    Electronically signed by Juliene Pina L. Coppage, PhD HCLD(ABB)       Current Treatment Plan   Created/Updated On 02/21/2016   Next Treatment Plan Due 08/22/2016       Assessment:  41 year old [place] male with a dx of Schizoaffective D/O.      Plan and Rationale:  Increase Prazosin to 11m QHS for nightmares. Goals include promoting symptom stability and appropriate use of medication; restore personal, community living and social skills; address and treat substance abuse issues; promote physical health; establish access to entitlements, housing, work and social opportunities; and promote the highest possible level of functioning in the community.   F/U per ACT team recommendations.      Greater than 50% of visit time spent counseling/coordinating care. Time spent counseling:  15 minutes

## 2016-10-04 ENCOUNTER — Ambulatory Visit: Payer: Self-pay

## 2016-10-07 ENCOUNTER — Ambulatory Visit: Payer: Self-pay

## 2016-10-07 NOTE — Progress Notes (Signed)
Behavioral Health Progress Note     LENGTH OF SESSION: 45 minutes    Contact Type:  Location: Off Site    Face to Face     Problem(s)/Goals Addressed from Treatment Plan:  Problem 1:  Date of Plan: 09/02/2016    Treatment Problem #1 09/02/2016   Patient Identified Problem Alc/drug use at current residence     Treatment Goal #1 09/02/2016   Patient Identified Goal "I would like to be out on my own".        Progress towards this goal:Pt is currently fulfilling objective 1a. "[facility]" for Treatment Problem #1/Patient Identified goal.      Mental Status Exam:  APPEARANCE: Casual, Disheveled  ATTITUDE TOWARD INTERVIEWER: Cooperative  MOTOR ACTIVITY: WNL (within normal limits)  EYE CONTACT: Direct  SPEECH: Soft and Slurred  AFFECT: Pleasant  MOOD: Normal  THOUGHT PROCESS: Circumstantial and Goal directed  THOUGHT CONTENT: No unusual themes  PERCEPTION: Within normal limits  CURRENT SUICIDAL IDEATION: patient denies  CURRENT HOMICIDAL IDEATION: Patient denies  ORIENTATION: Alert and Oriented X 3.  CONCENTRATION: WNL  MEMORY:   Recent: intact   Remote: intact  COGNITIVE FUNCTION: Average intelligence  JUDGMENT: Impaired -  minimal  IMPULSE CONTROL: Poor  INSIGHT: Fair    Risk Assessment:  ASSESSMENT OF RISK FOR SUICIDAL BEHAVIOR  Changes in risk for suicide from baseline Formulation of Risk and/or previous intake, including newly identified risk, if any: none      Session Content::  Wtr arrived at pt's house and pt answered the door and greeted wtr. Derek Walker appeared to be perspiring moderately and had minimally slurred speech. Wtr asked if pt had been drinking and pt stated that he had a beer in the afternoon to relax and cope with stressors. Wtr and pt discussed some of the benefits of drinking alcohol and discussed how in cases of chemical dependency it can also be a maladaptive coping skill. Pt received well but failed  to acknolwedge that his drinking has been problematic except for in his past, not currently. Derek Walker discussed how family members are stressors to him in their noise level, moving around the house, and in dialogue content. Wtr asked pt if he planned on going over to his mother's residence for the weekend to remove himself from the stressors. Derek Walker stated that he would be going over later in the evening but was currently waiting for his youngest daughter, [name], to come home off the bus from school. Wtr confirmed appointment with pt for 10/07/2016 [facility]. Pt agreed. Wtr wished pt a good weekend and said goodbyes. Wtr left.    Visit Diagnosis:    Major psychotic depression, recurrent- Primary ICD-10-CM: F33.3  ICD-9-CM: 296.34       Interventions:  Continued to develop therapeutic relationship  Supportive Psychotherapy  Treatment Planning    Current Treatment Plan   Created/Updated On 02/21/2016   Next Treatment Plan Due 08/22/2016         Plan:  Psychotherapy continues as described in care plan; plan remains the same.    NEXT APPT: 10/07/2016, as per ACT calendar      Derek Walker, LMSW

## 2016-10-08 NOTE — Progress Notes (Signed)
[facility] MISSED/CANCELLED APPOINTMENT     Name: Derek NielsenRALPH D Walker  MRN: [id number]   DOB: April 27, 1976    Date of Scheduled Service: 10/07/2016    Mr. Vannote was a no show for today's appointment.  I have left the patient a message to contact me.    Additional Information:    Not applicable    Alinda MoneyShannon Gurtej Noyola, LMSW

## 2016-10-09 ENCOUNTER — Ambulatory Visit: Payer: Self-pay

## 2016-10-10 ENCOUNTER — Ambulatory Visit: Payer: Self-pay | Admitting: Psychiatry

## 2016-10-14 ENCOUNTER — Ambulatory Visit: Payer: Self-pay

## 2016-10-14 NOTE — Progress Notes (Signed)
Behavioral Health Progress Note     LENGTH OF SESSION: 45 minutes    Contact Type:  Location: Off Site    Face to Face     Problem(s)/Goals Addressed from Treatment Plan:    Problem 1:    Treatment Problem #1 09/02/2016   Patient Identified Problem Alc/drug use at current residence     Treatment Goal #1 09/02/2016   Patient Identified Goal "I would like to be out on my own".      Progress towards this goal:Pt is currently fulfilling objective 1a. "Iwill successfully complete [facility] Chemical Dependency treatment" for Treatment Problem #1/Patient Identified goal.    Mental Status Exam:  APPEARANCE: Well-groomed, Casual  ATTITUDE TOWARD INTERVIEWER: Cooperative  MOTOR ACTIVITY: WNL (within normal limits)  EYE CONTACT: Indirect  SPEECH: Normal rate and tone  AFFECT: Anxious and Pleasant  MOOD: Anxious  THOUGHT PROCESS: Goal directed  THOUGHT CONTENT: No unusual themes  PERCEPTION: Within normal limits  CURRENT SUICIDAL IDEATION: patient denies  CURRENT HOMICIDAL IDEATION: Patient denies  ORIENTATION: Alert and Oriented X 3.  CONCENTRATION: Poor  MEMORY:   Recent: intact   Remote: intact  COGNITIVE FUNCTION: Average intelligence  JUDGMENT: Impaired -  minimal  IMPULSE CONTROL: Poor  INSIGHT: Fair    Risk Assessment:  ASSESSMENT OF RISK FOR SUICIDAL BEHAVIOR  Changes in risk for suicide from baseline Formulation of Risk and/or previous intake, including newly identified risk, if any: none      Session Content: Wtr called pt to let him know that wtr would be a few minutes late to pick him up from [facility]. Pt reported that he was at his daughter's mother's residence on [address] and had stayed home sick from group today. Wtr asked if pt would still like to re-apply for temporary assistance at [facility]. Pt stated that he would like to do so and needed to pick up medications from [facility]. Wtr agreed to take pt to pick up medications from [facility] pharmacy.     Wtr and pt arrived  at [facility] and after going through security and pt starting application pt reported that he wanted to come back another day as pt was feeling severe anxiety due to large number of people at [facility]. Wtr agreed they could come back another day and left. Wtr asked pt if fresh air would be helpful and pt stated it might be so wtr opened passenger side window while driving pt to pick up medications from [facility]. Pt stated he was "getting his mind straight again" and stated "if I had not have left then I would have ended up running out of there". Wtr and pt discussed pt's strength of awareness of limitations and when to push through and when to take a break. Pt stated his chest was tight and wtr and pt stated awareness of physical symptoms of anxiety and different coping skills such a change of environment and deep breathing. Pt was receptive.    Wtr and pt picked up medications and pt stated that not all of his prescriptions were refilled. Wtr told pt that wtr would follow-up with ACT Team NP, [facility]. Pt thanked wtr. Pt stated he would fill out the temporary assistance application at his mother's residence and would turn in later this week. Wtr stated that if pt needed help wtr would be available to do so. Wtr and pt said goodbyes and wtr left.    Visit Diagnosis:    Major psychotic  depression, recurrent- Primary ICD-10-CM: F33.3  ICD-9-CM: 296.34         Interventions:  Continued to develop therapeutic relationship  [facility] paperwork  Provided therapeutic support during discussion of distressing/traumatic events and/or symptoms    Current Treatment Plan   Created/Updated On 02/21/2016   Next Treatment Plan Due 08/22/2016         Plan:  Psychotherapy continues as described in care plan; plan remains the same.    NEXT APPT: 10/16/2016, as per ACT calendar      Alinda MoneyShannon Janeal Abadi, LMSW

## 2016-10-15 NOTE — Progress Notes (Signed)
Behavioral Health Progress Note     LENGTH OF SESSION: 90 minutes    Contact Type:  Location: Off Site    Face to Face, Other: Criminal Court     Problem(s)/Goals Addressed from Treatment Plan:    Problem 1:   Date of Plan: 09/02/2016    Treatment Problem #1 09/02/2016   Patient Identified Problem Alc/drug use at current residence     Treatment Goal #1 09/02/2016   Patient Identified Goal "I would like to be out on my own".        Progress towards this goal: Pt is currently fulfilling objective 1a. "[facility]" for Treatment Problem #1/Patient Identified goal.        Mental Status Exam:  APPEARANCE: Casual, Disheveled  ATTITUDE TOWARD INTERVIEWER: Cooperative  MOTOR ACTIVITY: WNL (within normal limits)  EYE CONTACT: Indirect  SPEECH: Soft  AFFECT: Anxious and Pleasant  MOOD: Anxious  THOUGHT PROCESS: Circumstantial and Goal directed  THOUGHT CONTENT: No unusual themes  PERCEPTION: Within normal limits  CURRENT SUICIDAL IDEATION: patient denies  CURRENT HOMICIDAL IDEATION: Patient denies  ORIENTATION: Alert and Oriented X 3.  CONCENTRATION: WNL  MEMORY:   Recent: intact   Remote: intact  COGNITIVE FUNCTION: Average intelligence  JUDGMENT: Impaired -  minimal  IMPULSE CONTROL: Fair  INSIGHT: Fair    Risk Assessment:  ASSESSMENT OF RISK FOR SUICIDAL BEHAVIOR  Changes in risk for suicide from baseline Formulation of Risk and/or previous intake, including newly identified risk, if any: none      Session Content::  [facility] and pt was [facility] to 3 yr probation. Pt was sweating profusely and stated that he was anxious about sentencing. Wtr provided supportive psychotherapy. Wtr was asked by Roger KillHon. Derek Walker if pt was engaged in treatment and wtr was able to state that Derek Walker was engaged in treatment both with [facility] and [facility]. Derek GaudierHon Walker thanked  wtr for her attendance and support of pt. Wtr transported pt home and planned  To meet 10/14/2016 to re-apply for temporary assistance. Pt confirmed. Wtr and pt said goodbyes and wtr left.      Visit Diagnosis:    Major psychotic depression, recurrent- Primary ICD-10-CM: F33.3  ICD-9-CM: 296.34       Interventions:  Collateral contact with:  Hon. Derek Walker  Continued to develop therapeutic relationship    Current Treatment Plan   Created/Updated On 02/21/2016   Next Treatment Plan Due 08/22/2016         Plan:  Psychotherapy continues as described in care plan; plan remains the same.    NEXT APPT: 10/14/2016, as per ACT calendar      Derek Walker, LMSW

## 2016-10-16 ENCOUNTER — Ambulatory Visit: Payer: Self-pay

## 2016-10-16 NOTE — Progress Notes (Signed)
Behavioral Health Progress Note     LENGTH OF SESSION: 60 minutes    Contact Type:  Location: Off Site    Face to Face   Problem(s)/Goals Addressed from Treatment Plan:    Problem 1:  Date of Plan: 09/02/2016    Treatment Problem #1 09/02/2016   Patient Identified Problem Alc/drug use at current residence     Treatment Goal #1 09/02/2016   Patient Identified Goal "I would like to be out on my own".        Progress towards this goal:Pt is currently fulfilling objective 1a. "[facility]" for Treatment Problem #1/Patient Identified goal.    Mental Status Exam:  APPEARANCE: Well-groomed, Casual  ATTITUDE TOWARD INTERVIEWER: Cooperative  MOTOR ACTIVITY: WNL (within normal limits)  EYE CONTACT: Indirect  SPEECH: Normal rate and tone  AFFECT: Full Range  MOOD: Anxious  THOUGHT PROCESS: Circumstantial and Goal directed  THOUGHT CONTENT: No unusual themes  PERCEPTION: Within normal limits  CURRENT SUICIDAL IDEATION: patient denies  CURRENT HOMICIDAL IDEATION: Patient denies  ORIENTATION: Alert and Oriented X 3.  CONCENTRATION: WNL  MEMORY:   Recent: intact   Remote: intact  COGNITIVE FUNCTION: Average intelligence  JUDGMENT: Impaired -  minimal  IMPULSE CONTROL: Poor  INSIGHT: Fair    Risk Assessment:  ASSESSMENT OF RISK FOR SUICIDAL BEHAVIOR  Changes in risk for suicide from baseline Formulation of Risk and/or previous intake, including newly identified risk, if any: none      Session Content::  Wtr arrived to pt's "daughter's mother's" [[name]'s] residence. Wtr knocked on the door and [name] answered. [name] stated that [name] was home and she would go get him. Wtr stepped into the residence and waited by the front door. A young man and a middle-aged man sat at the dining room table, it would appear the older man tutoring the younger man in school subjects. [name] appeared and stated that he needed to put his boots on and would be  ready to go. Wtr asked pt if he thought he could [facility] with [facility]tr and turn in paperwork as earlier in the week wtr and pt had to leave [facility] as pt was sweating profusely and reported severe anxiety due [facility]. Pt stated that he had completed his DHS paperwork and would be able to drop it off today with wtr. Pt stated that the young man at the dining room table was [name]'s son and that he had been "kicked out of school" and was now receiving tutoring at home. Pt stated that his youngest daughter, Thana Atesnicknamed [name], loved school and "has to do her homework as soon as he gets home. She loves it".  Wtr  Commended pt for helping "[name]" to complete her homework.     [facility] and both wtr and pt went into DHS. [name] asked the security if he would be able to just drop off the paperwork and security guard stated that unfortunately pt would have to get in line. Wtr and pt went through security and waiting in Line 1 to drop off Johnatan's paperwork to re-apply for temporary assistance as pt's sanction would be lifted 10/17/2016 making pt eligible to receive temporary assistance again. [facility] than onated  previous day and pt stated that he was pleased. [facility] and got his paperwork stamped and received a receipt.  Wtr transported pt to [facility] pharmacy to pick up refill Rx. Pt stated that he didn't go over to his mother's house as much because he cousins "use it as their personal hangout spot" and explained that the only thing that Elric had in common with his cousins in the past was "drinking". Wtr asked if that was difficult for pt to change people/places/things and pt stated that he would rather relax at home and enjoy learning by reading a book or watching the discovery or history channel.     Pt stated that he enjoyed group at [facility] for chemical dependency treatment and stated that all group members  participated except for "one guy who is the class clown. He doesn't take anything seriously. He really bugs me". Pt stated that this morning all group members chose a word and described how it helped them in recovery. Pt stated that he drew the word "thoughtful" from the choices available and stated that helping others, being thoughtful, and "considerate" helped him to remain positive and have a momentum for his recovery. Wtr commended pt for such a "thoughtful" response and thought that pt had wonderful consideration of others and was a strength of Casmir's. Pt stated that he was thoughtful of others and sounded proud of this personal strength.     Wtr and pt said goodbyes and wtr left.     Visit Diagnosis:    Major psychotic depression, recurrent- Primary ICD-10-CM: F33.3  ICD-9-CM: 296.34     Interventions:  Continued to develop therapeutic relationship  DHS paperwork  Group services referral:  Pt stated that after completing [facility] that he would be required to continue chemical dependency treatment for probation and wtr informed pt of ACT Team IDDT group services and pt stated that he was interested in participating once he had completed [address] Chemical dependency tx program.   Identified adaptive/maladaptive family patterns  Taught/practiced coping skills (specify skills used):  distraction skills/change of people, places, and things to cope with cravings for alcohol     Current Treatment Plan   Created/Updated On 02/21/2016   Next Treatment Plan Due 08/22/2016         Plan:  Psychotherapy continues as described in care plan; plan remains the same.    NEXT APPT: 10/21/2016      [name], LMSW

## 2016-10-21 ENCOUNTER — Ambulatory Visit: Payer: Self-pay

## 2016-10-21 NOTE — Progress Notes (Signed)
[facility] MISSED/CANCELLED APPOINTMENT     Name: Derek NielsenRALPH D Salsbury  MRN: [id number]   DOB: 28-Mar-1976    Date of Scheduled Service: 10/21/2016    [name] was a no show for today's appointment.  I have left the patient a message to contact me.    Additional Information:    Not applicable     Alinda MoneyShannon Maressa Apollo, LMSW

## 2016-10-23 ENCOUNTER — Other Ambulatory Visit: Payer: Self-pay | Admitting: Psychiatry

## 2016-10-23 ENCOUNTER — Telehealth: Payer: Self-pay

## 2016-10-23 ENCOUNTER — Ambulatory Visit: Payer: Self-pay

## 2016-10-23 MED ORDER — MIRTAZAPINE 15 MG PO TABS *I*
15.0000 mg | ORAL_TABLET | Freq: Every evening | ORAL | 5 refills | Status: DC
Start: 2016-10-23 — End: 2016-11-28

## 2016-10-23 NOTE — Telephone Encounter (Signed)
[facility] ACT Team Telephone Call     Date of call: [phone]     Writer advised that Derek Walker has indicated he is in need of refills of his medications and that he does not have any.     [facility] on [address] via [phone] to inquire about Ralphs medications, advised as follows:       Prazosin - last filled on 012818   Zyprexa 5 mg - last filled on 120417   Zyprexa 15 mg - last filled on 093016 - never picked up with increased dose to the 15 mg by Derek PiaShahid Ali NP on 09/05/16   Risperdal  - last filled on 120417   Remeron - last filled on 120417    Refills are available on all of the above. Writer asked that the meds be filled.  Pharmacy advises that Derek Walker will need to pick them up by tomm night as the [facility] the [facility].       In basket to Derek Walker of the above, cc NP Walker/K[place]ara[place]Birklin and Samara SnideKarie Ferera RN as well.  Of note, Derek Walker continues to be in need of labs.      Derek AppleLisa A Vernis Cabacungan, RN

## 2016-10-24 ENCOUNTER — Ambulatory Visit: Payer: Self-pay

## 2016-10-25 ENCOUNTER — Other Ambulatory Visit: Payer: Self-pay | Admitting: Psychiatry

## 2016-10-25 NOTE — Progress Notes (Signed)
Behavioral Health Progress Note     LENGTH OF SESSION: 20 minutes    Contact Type:  Location: Off Site    Face to Face     Problem(s)/Goals Addressed from Treatment Plan:    Problem 1:  Date of Plan: 09/02/2016    Treatment Problem #1 09/02/2016   Patient Identified Problem Alc/drug use at current residence     Treatment Goal #1 09/02/2016   Patient Identified Goal "I would like to be out on my own".        Progress towards this goal:Pt is currently fulfilling objective 1a. "[facility]will successfully complete [facility]" for Treatment Problem #1/Patient Identified goal.      Mental Status Exam:  APPEARANCE: Casual, Disheveled  ATTITUDE TOWARD INTERVIEWER: Cooperative  MOTOR ACTIVITY: WNL (within normal limits)  EYE CONTACT: Direct  SPEECH: Soft and Slowed  AFFECT: Anxious, Pleasant and Depressed  MOOD: Anxious  THOUGHT PROCESS: Normal  THOUGHT CONTENT: No unusual themes  PERCEPTION: Within normal limits  CURRENT SUICIDAL IDEATION: patient denies  CURRENT HOMICIDAL IDEATION: Patient denies  ORIENTATION: Alert and Oriented X 3.  CONCENTRATION: Fair  MEMORY:   Recent: intact   Remote: intact  COGNITIVE FUNCTION: Average intelligence  JUDGMENT: Impaired -  mild  IMPULSE CONTROL: Poor  INSIGHT: Poor    Risk Assessment:  ASSESSMENT OF RISK FOR SUICIDAL BEHAVIOR  Changes in risk for suicide from baseline Formulation of Risk and/or previous intake, including newly identified risk, if any: none    Session Content::  Wtr had almost arrived at agreed upon pick-up location at [facility] dependency [facility] and pt called wtr to inform wtr that he had missed group today and was at pt's "daughter's mother" Rosario Adie([name]) residence on [address] Wtr thanked pt for update and agreed to meet pt at [employer] residence. Wtr arrived and [name] answered the door and greeted wtr. Wtr waited for pt in entrance room. Pt appeared and invited wtr to  sit down. Pt stated that he was not feeling well and thought that he might have the flu. Wtr asked if pt would like to schedule a doctor's appointment but the pt declined doing so at this time.     Pt stated he couldn't keep any food down and wtr asked if pt had drank the night before and wondered if that may be contributing to pt's current state. Pt stated that he had drank the night before and the symptoms he is experiencing may be from drinking heavily. Pt stated that [name] made him chicken broth soup and he was not able to eat it. Pt stated that he made it to group on Tuesday 10/22/2016 at [facility] for chemical dependency treatment. Pt stated that he drank too much "on Superbowl" 10/20/2016 and had not made it to group on Monday. Wtr provided validation and support. Pt stated that watching a lot of television and drinking had contributed to his suicidal ideation and anger/irritability towards others. Wtr asked if pt could leave and go to his mom's [address] on [address] And pt stated that his cousins had moved into his mother's residence and that they were a bad influence and did not care to be around them, "not that they have influence over me, but they're up to no good". Wtr agreed that it was healthy to have boundary lines with negative influences but acknowledged how that must be difficult for [name] as his mother's residence has provided  respite for Seung from the people and circumstances that Deverick experiences as daily stressors. Pt stated that it was difficult and was feeling "some kind of way about it".     While wtr and pt spoke, one of [name]'s children had a tutor arrive and pt now stated that since the child's lesson is starting he would cut the visit short and confirmed to meet with wtr 10/24/2016 to pick-up medications and discuss coping skills for daily stressors. Wtr and pt said goodbyes and wtr left.    Visit Diagnosis:    Major psychotic depression, recurrent- Primary ICD-10-CM:  F33.3  ICD-9-CM: 296.34     Interventions:  Continued to develop therapeutic relationship    Current Treatment Plan     Created/Updated On 09/02/2016   Next Treatment Plan Due 03/03/2017         Plan:  Psychotherapy continues as described in care plan; plan remains the same.    NEXT APPT: 10/24/2016, as per ACT calendar      [name], LMSW

## 2016-10-25 NOTE — Progress Notes (Signed)
Behavioral Health Progress Note     LENGTH OF SESSION: 90 minutes    Contact Type:  Location: Off Site    Face to Face       Problem(s)/Goals Addressed from Treatment Plan:    Problem 1:  Date of Plan: 09/02/2016    Treatment Problem #1 09/02/2016   Patient Identified Problem Alc/drug use at current residence     Treatment Goal #1 09/02/2016   Patient Identified Goal "I would like to be out on my own".        Progress towards this goal:Pt is currently fulfilling objective 1a. "Iwill successfully complete [place] Chemical Dependency treatment" for Treatment Problem #1/Patient Identified goal.      Mental Status Exam:  APPEARANCE: Casual  ATTITUDE TOWARD INTERVIEWER: Cooperative  MOTOR ACTIVITY: WNL (within normal limits)  EYE CONTACT: Direct  SPEECH: Normal rate and tone  AFFECT: Anxious, Pleasant and Depressed  MOOD: Anxious and Depressed  THOUGHT PROCESS: Normal  THOUGHT CONTENT: No unusual themes  PERCEPTION: Within normal limits  CURRENT SUICIDAL IDEATION: patient denies  CURRENT HOMICIDAL IDEATION: Patient denies  ORIENTATION: Alert and Oriented X 3.  CONCENTRATION: Fair  MEMORY:   Recent: intact   Remote: intact  COGNITIVE FUNCTION: Average intelligence  JUDGMENT: Impaired -  mild  IMPULSE CONTROL: Poor  INSIGHT: Poor    Risk Assessment:  ASSESSMENT OF RISK FOR SUICIDAL BEHAVIOR  Changes in risk for suicide from baseline Formulation of Risk and/or previous intake, including newly identified risk, if any: none    Session Content::  Wtr transported pt to Derek Walker at [employer] [facility] [employer]. And pharmacist stated medication refills were at Derek Walker at [employer]. Wtr transported Derek Walker to Derek Walker at [employer] and went into pharmacy with pt to pick-up medications. Pt stated it would be more convenient to get all medications from one place at one time and wtr stated that Derek Walker could help fulfill this by going through their pharmacy and pt stated, "Let's do that."  Wtr confirmed pt would like to switch pharmacies and pt stated he would like to.     Pt stated, "I forgot until just now I told my probation officer that I would go over to meet her for the first time. It's my first appointment". Wtr asked what time the appointment was and pt paused and then stated at 1pm. Wtr transported pt to probation on Derek Walker and waited with pt to be called in by Engineer, manufacturing systems for initial meeting.     Wtr transported pt back to his daughter's mother Derek Walker) residence. Wtr and pt said goodbyes and wtr left.    Visit Diagnosis:    Major psychotic depression, recurrent - Primary ICD-10-CM: F33.3  ICD-9-CM: 296.34     Interventions:  Continued to develop therapeutic relationship  met with pt's probation officer for initial appointment, picked up medications from [place] at Derek Walker and identified mainstreaming all of his medications through Derek Walker so that pt did not have to organize this on his own as he has also reported not being able to do so.      Created/Updated On 09/02/2016   Next Treatment Plan Due 03/03/2017       Plan:  Psychotherapy continues as described in care plan; plan remains the same.    NEXT APPT: 10/28/2016, as per ACT calendar      Danise Edge, LMSW

## 2016-10-28 ENCOUNTER — Ambulatory Visit: Payer: Self-pay | Admitting: Psychiatry

## 2016-10-28 DIAGNOSIS — F329 Major depressive disorder, single episode, unspecified: Secondary | ICD-10-CM

## 2016-10-28 NOTE — Progress Notes (Signed)
Behavioral Health ACT Progress Note     LENGTH OF SESSION: 30 minutes    Contact Type:  Location: Off Site    Face to Face     Visit Diagnosis:    No diagnosis found.    Problem(s)/Goals Addressed from Treatment Plan:      Mental Status Exam:  APPEARANCE: Appears stated age  ATTITUDE TOWARD INTERVIEWER: Cooperative  MOTOR ACTIVITY: WNL (within normal limits)  EYE CONTACT: Direct  SPEECH: Normal rate and tone  AFFECT: Neutral  MOOD: Normal  THOUGHT PROCESS: Normal  THOUGHT CONTENT: No unusual themes  PERCEPTION: Within normal limits  CURRENT SUICIDAL IDEATION: patient denies  CURRENT HOMICIDAL IDEATION: Patient denies  ORIENTATION: Alert and Oriented X 3.  CONCENTRATION: WNL  MEMORY:   Recent: intact   Remote: intact  COGNITIVE FUNCTION: Below Average intelligence  JUDGMENT: Intact  IMPULSE CONTROL: Fair  INSIGHT: Fair    Risk Assessment:  Denies any SI  Denies any violent intentions/ideations    Session Content::   During the meeting with the ACT team the patient reported that he has been doing good and has been going every day to chemical dependency program. He is on probation and has been getting his urine checked at the CD facility every week. Appeared to be motivated to keep clean and stay away from drugs. Claims compliance with medication, denies any SI,HI or AVH. Was able to have reasonable conversation and was able to let his needs known       Interventions:  Continued to develop therapeutic relationship    Plan:  Continue Psychopharmacology regimen as prescribed.      Sandria SenterNEHA Crewe Heathman, MD

## 2016-10-30 ENCOUNTER — Ambulatory Visit: Payer: Self-pay

## 2016-10-31 ENCOUNTER — Ambulatory Visit: Payer: Self-pay | Admitting: Psychiatry

## 2016-11-04 ENCOUNTER — Other Ambulatory Visit
Admission: RE | Admit: 2016-11-04 | Discharge: 2016-11-04 | Disposition: A | Discharge status: Home / Self Care | Payer: Self-pay | Source: Ambulatory Visit | Attending: Infectious Diseases | Admitting: Infectious Diseases

## 2016-11-04 ENCOUNTER — Ambulatory Visit: Payer: Self-pay

## 2016-11-04 DIAGNOSIS — Z79899 Other long term (current) drug therapy: Secondary | ICD-10-CM

## 2016-11-04 DIAGNOSIS — B20 Human immunodeficiency virus [HIV] disease: Secondary | ICD-10-CM

## 2016-11-04 DIAGNOSIS — Z8639 Personal history of other endocrine, nutritional and metabolic disease: Secondary | ICD-10-CM

## 2016-11-04 LAB — CBC AND DIFFERENTIAL
Baso # K/uL: 0 10*3/uL (ref 0.0–0.1)
Basophil %: 0.8 %
Eos # K/uL: 0.1 10*3/uL (ref 0.0–0.5)
Eosinophil %: 1.3 %
Hematocrit: 42 % (ref 40–51)
Hemoglobin: 13.9 g/dL (ref 13.7–17.5)
IMM Granulocytes #: 0 10*3/uL (ref 0.0–0.1)
IMM Granulocytes: 0.3 %
Lymph # K/uL: 1 10*3/uL — ABNORMAL LOW (ref 1.3–3.6)
Lymphocyte %: 24.3 %
MCH: 33 pg/cell — ABNORMAL HIGH (ref 26–32)
MCHC: 33 g/dL (ref 32–37)
MCV: 101 fL — ABNORMAL HIGH (ref 79–92)
Mono # K/uL: 0.6 10*3/uL (ref 0.3–0.8)
Monocyte %: 14 %
Neut # K/uL: 2.4 10*3/uL (ref 1.8–5.4)
Nucl RBC # K/uL: 0 10*3/uL (ref 0.0–0.0)
Nucl RBC %: 0 /100 WBC (ref 0.0–0.2)
Platelets: 185 10*3/uL (ref 150–330)
RBC: 4.2 MIL/uL — ABNORMAL LOW (ref 4.6–6.1)
RDW: 12.4 % (ref 11.6–14.4)
Seg Neut %: 59.3 %
WBC: 4 10*3/uL — ABNORMAL LOW (ref 4.2–9.1)

## 2016-11-04 LAB — COMPREHENSIVE METABOLIC PANEL
ALT: 65 U/L — ABNORMAL HIGH (ref 0–50)
AST: 49 U/L (ref 0–50)
Albumin: 4.6 g/dL (ref 3.5–5.2)
Alk Phos: 95 U/L (ref 40–130)
Anion Gap: 13 (ref 7–16)
Bilirubin,Total: 0.3 mg/dL (ref 0.0–1.2)
CO2: 27 mmol/L (ref 20–28)
Calcium: 9.6 mg/dL (ref 9.0–10.3)
Chloride: 105 mmol/L (ref 96–108)
Creatinine: 0.88 mg/dL (ref 0.67–1.17)
GFR,Black: 123 *
GFR,Caucasian: 107 *
Glucose: 78 mg/dL (ref 60–99)
Lab: 6 mg/dL (ref 6–20)
Potassium: 4.3 mmol/L (ref 3.3–5.1)
Sodium: 145 mmol/L (ref 133–145)
Total Protein: 7.2 g/dL (ref 6.3–7.7)

## 2016-11-04 NOTE — Progress Notes (Signed)
Behavioral Health Progress Note     LENGTH OF SESSION: 90 minutes    Contact Type:  Location: Off Site    Face to Face   Problem(s)/Goals Addressed from Treatment Plan:    Problem 1:  Date of Plan: 09/02/2016    Treatment Problem #1 09/02/2016   Patient Identified Problem Alc/drug use at current residence     Treatment Goal #1 09/02/2016   Patient Identified Goal "I would like to be out on my own".        Progress towards this goal:Pt is currently fulfilling objective 1a. "Iwill successfully complete [facility]ency [facility]" for Treatment Problem #1/Patient Identified goal.    Mental Status Exam:  APPEARANCE: Casual  ATTITUDE TOWARD INTERVIEWER: Cooperative  MOTOR ACTIVITY: WNL (within normal limits)  EYE CONTACT: Indirect  SPEECH: Normal rate and tone  AFFECT: Anxious and Pleasant  MOOD: Anxious  THOUGHT PROCESS: Circumstantial and Goal directed  THOUGHT CONTENT: No unusual themes  PERCEPTION: Within normal limits  CURRENT SUICIDAL IDEATION: patient denies  CURRENT HOMICIDAL IDEATION: Patient denies  ORIENTATION: Alert and Oriented X 3.  CONCENTRATION: WNL  MEMORY:   Recent: intact   Remote: intact  COGNITIVE FUNCTION: Average intelligence  JUDGMENT: Intact  IMPULSE CONTROL: Fair  INSIGHT: Fair    Risk Assessment:  ASSESSMENT OF RISK FOR SUICIDAL BEHAVIOR  Changes in risk for suicide from baseline Formulation of Risk and/or previous intake, including newly identified risk, if any: none    Session Content::  Wtr arrived at pt's residence and knocked on the door. Pt came to the door and greeted wtr and invited wtr into residence. Wtr asked pt if he was all ready to go and pt stated that he was. Pt stated that he got out of chemical dependency group at [facility] early that day because his counselor was out sick. Pt stated that the group had a substitute group facilitator who was a nurse at [facility] and he liked the format she had  used for group that day. Pt stated that he was engaged. Pt left the room and as he walked away he said, "Wait, I have somebody I'd like you to meet". Wtr waited in the front room and a little girl emerged with [name]. Wtr stated, "Oh my, this must be [name]". And Dorthy stated, "it is", with a smile. Wtr stated to "[name]", "It is so nice to meet you. I have heard so many wonderful things about you". [name] stated, "Thank you. It is nice to meet you". Wtr stated to "[name]", "I love your outfit. Are you playing dress up this afternoon?" [name] shook her head yes. Wtr asked if [name] was enjoying her week off from school and [name] stated she was enjoying it and Reyes chimed in, "Is she ever, she's playing all over the house all day and all night". Wtr smiled and asked [name] what she was doing this afternoon and she stated she would be playing with Tobi Bastos her doll. Wtr asked if this was Tobi Bastos from the movie 'Frozen' and [name] confirmed. [name] asked wtr if she would like to see 'Tobi Bastos' and wtr stated she would. [name] ran to her room and came back with her Tobi Bastos doll. Wtr crouched down and said, "Oh she's beautiful! Look at that, your dress is almost exactly like Anna's. You're twins!" [name] smiled and thanked wtr. Wtr wished [name] a wonderful afternoon with Tobi Bastos and that it was nice to meet her. Cirilo stated, "  We have to get some things done but I'll be back this afternoon". [name] acknowledged. Wtr and pt left.    [facility] on [address] Wtr waited in the waiting room while pt completed his labs. [facility] [address] [facility] and asked the front desk where to find admissions. Wtr and pt went to Admissions and asked how pt might begin the process of applying. Pt asked to meet with someone to discuss online programs. Pt was given information about online programs and online courses. Wtr and pt went to [employer] and attendant there explained that every Tuesday  at 10am there is an open community forum to learn about financial aid and have your questions answered. Pt stated he would schedule with wtr and attend soon. Wtr and pt thanked attendant for their time. Wtr and pt left and wtr drove [name] back to his residence on [address] Wtr confirmed with pt meeting on 11/07/2016 for [facility] [employer] appointment at 2pm, also on pt's birthday. Pt confirmed and wtr left.    Visit Diagnosis:    Major psychotic depression, recurrent - Primary ICD-10-CM: F33.3  ICD-9-CM: 296.34       Interventions:  Continued to develop therapeutic relationship  Information on negotiating the educational system discussed  Resource information given for:  [employer]      Created/Updated On 09/02/2016   Next Treatment Plan Due 03/03/2017     Plan:  Psychotherapy continues as described in care plan; plan remains the same.    NEXT APPT: 11/07/2016, as per ACT calendar      Alinda MoneyShannon Devynn Scheff, LMSW

## 2016-11-05 LAB — LYMPHOCYTE SUBSET (T CELLS ONLY)
CD4#: 221 cells/uL — ABNORMAL LOW (ref 496–2186)
CD4%: 24 % — ABNORMAL LOW (ref 32–71)
CD4/CD8: 0.5 — ABNORMAL LOW (ref 0.7–3.0)
CD8#: 433 cells/uL (ref 177–1137)
T LYM #(CD3): 688 cells/uL — ABNORMAL LOW (ref 754–2810)
T LYM %(CD3): 76 % (ref 54–87)
T Suppress %(CD8): 48 % — ABNORMAL HIGH (ref 10–38)

## 2016-11-05 LAB — HIV VIRAL LOAD
HIV-1 PCR log: NEGATIVE copies/mL
HIV-1 PCR: NEGATIVE copies/mL

## 2016-11-06 ENCOUNTER — Other Ambulatory Visit: Payer: Self-pay | Admitting: Infectious Diseases

## 2016-11-06 LAB — VITAMIN D
25-OH VIT D2: <4 ng/mL
25-OH VIT D3: <6 ng/mL
25-OH Vit Total: <10 ng/mL — ABNORMAL LOW (ref 30–60)

## 2016-11-06 MED ORDER — ERGOCALCIFEROL 50000 UNIT PO CAPS *I*
50000.0000 [IU] | ORAL_CAPSULE | ORAL | 1 refills | Status: AC
Start: 2016-11-06 — End: 2017-01-05

## 2016-11-06 NOTE — Telephone Encounter (Signed)
[facility]. let patient know all of this.

## 2016-11-06 NOTE — Progress Notes (Signed)
Behavioral Health Progress Note     LENGTH OF SESSION: 60 minutes    Contact Type:  Location: Off Site    Collateral Contact, Face to Face     Problem(s)/Goals Addressed from Treatment Plan:    Problem 1:  Date of Plan: 09/02/2016    Treatment Problem #1 09/02/2016   Patient Identified Problem Alc/drug use at current residence     Treatment Goal #1 09/02/2016   Patient Identified Goal "I would like to be out on my own".        Progress towards this goal:Pt is currently fulfilling objective 1a. "Iwill successfully complete [place] Chemical Dependency treatment" for Treatment Problem #1/Patient Identified goal.      Mental Status Exam:  APPEARANCE: Casual  ATTITUDE TOWARD INTERVIEWER: Cooperative  MOTOR ACTIVITY: WNL (within normal limits)  EYE CONTACT: Direct  SPEECH: Normal rate and tone  AFFECT: Anxious  MOOD: Anxious and Depressed  THOUGHT PROCESS: Circumstantial and Goal directed  THOUGHT CONTENT: No unusual themes  PERCEPTION: Within normal limits  CURRENT SUICIDAL IDEATION: patient denies  CURRENT HOMICIDAL IDEATION: Patient denies  ORIENTATION: Alert and Oriented X 3.  CONCENTRATION: WNL  MEMORY:   Recent: intact   Remote: intact  COGNITIVE FUNCTION: Average intelligence  JUDGMENT: Impaired -  minimal  IMPULSE CONTROL: Fair  INSIGHT: Fair    Risk Assessment:  ASSESSMENT OF RISK FOR SUICIDAL BEHAVIOR  Changes in risk for suicide from baseline Formulation of Risk and/or previous intake, including newly identified risk, if any: none    Session Content::  Wtr transported pt from Chemical Dependency treatment at [place] to probation on [address] Wtr and pt met with pt's Engineer, manufacturing systems, Charlies Constable. Pt was polite and appropriate per the occasion. Pt stated that he had not had anything to drink recently which was contradictory to pt's report of having "a couple of beers" while watching television "to unwind" regularly. Wtr did not mention this but took  note of it. Probation office let pt know that she would be coming to pt's residence on [address] to make initial visit and pt was agreeable. Wtr transported pt home after the probation appointment.    Wtr and pt confirmed 11/04/2016 to complete UR labs and go to [facility] to request pt's GED certificate to begin application process as pt would like to return to school and take "online classes". Wtr wished pt well and left.    Visit Diagnosis:    Major psychotic depression, recurrent- Primary ICD-10-CM: F33.3  ICD-9-CM: 296.34         Interventions:  Collateral contact with:  pt's probation officer  Continued to develop therapeutic relationship  Information on negotiating the educational system discussed    Current Treatment Plan   Created/Updated On 09/02/2016   Next Treatment Plan Due 03/03/2017         Plan:  Psychotherapy continues as described in care plan; plan remains the same. and Other planned interventions or recommendations: Pt's labs are due for URlabs and scheduled with wtr for 2/19 to complete. Pt has also stated that he would like to pursue online learning through [facility] and wtr and pt will go to [facility] to request pt's GED certificate to begin application process.    NEXT APPT: 11/04/2016, as per ACT calendar      Danise Edge, LMSW

## 2016-11-06 NOTE — Telephone Encounter (Signed)
The patient is deficient in vitamin D and needs to begin high-dose treatment, 50,000 international units once per week for 8 weeks.  When that is finished, he will need a maintenance dosage    Will ask ID RN staff to contact the patient with this information and find out what pharmacy he would like this to be sent to.      In addition, Patient does not have a future appointment pending.  I will ask the IDRN/OAS staff to contact the patient and schedule a follow up with Mardene CelesteKelly Farrow, NP .

## 2016-11-07 ENCOUNTER — Ambulatory Visit: Payer: Self-pay

## 2016-11-13 ENCOUNTER — Ambulatory Visit: Payer: Self-pay

## 2016-11-18 NOTE — Progress Notes (Signed)
Behavioral Health Progress Note     LENGTH OF SESSION: 60 minutes    Contact Type:  Location: Off Site    Face to Face     Problem(s)/Goals Addressed from Treatment Plan:    Problem 1:  Date of Plan: 09/02/2016    Treatment Problem #1 09/02/2016   Patient Identified Problem Alc/drug use at current residence     Treatment Goal #1 09/02/2016   Patient Identified Goal "I would like to be out on my own".        Progress towards this goal:Pt is currently fulfilling objective 1a. "Iwill successfully complete [place] Chemical Dependency treatment" for Treatment Problem #1/Patient Identified goal.      Mental Status Exam:  APPEARANCE: Well-groomed, Casual  ATTITUDE TOWARD INTERVIEWER: Cooperative  MOTOR ACTIVITY: WNL (within normal limits)  EYE CONTACT: Direct  SPEECH: Normal rate and tone  AFFECT: Full Range  MOOD: Normal  THOUGHT PROCESS: Circumstantial and Goal directed  THOUGHT CONTENT: Negative Rumination  PERCEPTION: Within normal limits  CURRENT SUICIDAL IDEATION: patient denies  CURRENT HOMICIDAL IDEATION: Patient denies  ORIENTATION: Alert and Oriented X 3.  CONCENTRATION: WNL  MEMORY:   Recent: intact   Remote: intact  COGNITIVE FUNCTION: Average intelligence  JUDGMENT: Impaired -  minimal  IMPULSE CONTROL: Poor  INSIGHT: Good    Risk Assessment:  ASSESSMENT OF RISK FOR SUICIDAL BEHAVIOR    Changes in risk for suicide from baseline Formulation of Risk and/or previous intake, including newly identified risk, if any: none    Session Content::  Wtr reviewed March calendar with pt. Wtr discussed coping strategies to maintain abstinence and discussed harm reduction with pt. Wtr transported pt to probation and met with pt's probation officer with pt. Pt discussed his twin daughters' deaths and the impact that had on pt. Wtr provided supportive psychotherapy and discussed pt's resiliency and strength as a parent to his youngest daughter Tarri Glenn '[name]'.    Visit Diagnosis:    Major  psychotic depression, recurrent- Primary ICD-10-CM: F33.3  ICD-9-CM: 296.34       Interventions:  Continued to develop therapeutic relationship  Provided therapeutic support during discussion of distressing/traumatic events and/or symptoms  Reviewed and discussed Treatment Plan/Treatment Plan Review  Taught/practiced coping skills (specify skills used):  Pt identified change of enviornment such as going to [employer], staying busy, changing people, places, and things, pt taking medications as prescribed, pt identified that exercising would be helpful for him to "take the edge off' and would like to apply for a [employer].  Wtr transported pt to probation and attended probation appointment with pt and his PO, Charlies Constable.    Current Treatment Plan   Created/Updated On 09/02/2016   Next Treatment Plan Due 03/03/2017       Plan:  Psychotherapy continues as described in care plan; plan remains the same.    NEXT APPT: 11/19/2016, [facility] 10 AM Financial Aid info session, as per ACT calendar      Danise Edge, LMSW

## 2016-11-19 ENCOUNTER — Ambulatory Visit: Payer: Medicaid Other | Attending: Psychiatry

## 2016-11-19 DIAGNOSIS — F323 Major depressive disorder, single episode, severe with psychotic features: Secondary | ICD-10-CM | POA: Insufficient documentation

## 2016-11-19 DIAGNOSIS — F333 Major depressive disorder, recurrent, severe with psychotic symptoms: Secondary | ICD-10-CM | POA: Insufficient documentation

## 2016-11-20 NOTE — Progress Notes (Signed)
Behavioral Health Progress Note     LENGTH OF SESSION: 90 minutes    Contact Type:  Location: Off Site    Face to Face     Treatment Problem #1 09/02/2016   Patient Identified Problem Alc/drug use at current residence     Treatment Goal #1 09/02/2016   Patient Identified Goal "I would like to be out on my own".         The rationale for addressing this problem is that resolving it will (select all that apply):  Reduce symptoms of disorder, Reduce functional impairment associated with disorder, Decrease likelihood of hospitalization, Facilitate transfer skills learned in therapy to everday life, Is a key motivational factor for the patient's participation in treatment and Reduce risk for suicide      Progress toward goal(s): N/A - New goal    1 a. Measurable Objectives : I will [name]                        Date established: 09/02/16                        Target date: 03/03/17                        Attained or Revised? new    1 b. Measurable Objectives : I will work on lifting DHS sanction so that funds are available for housing                        Date established: 09/02/16                        Target date: 09/20/16                        Attained or Revised? new    1 c. Measurable Objectives : I will meet with ACT staff at least twice monthly to discuss and receive assistance in searching for safe and affordable housing, scheduling tours, making phone calls, completing applications, etc. As demonstrated by [name] having a safe and affordable place to live in the next six months                        Date established: 09/02/16                                       Target date: 02/2017                        Attained or Revised? continue      Progress towards this goal:Pt is currently fulfilling objective 1a. "Iwill successfully complete [facility]" for Treatment Problem #1/Patient  Identified goal. Wtr was informed by DSS that pt would need to attend inpatient chemical dependeny treatment in order to lift sanction and pt is unwilling to attend inpatient treatment, however has continued with his outpatient treatment at [facility] Pt has been awarded Tree surgeon disability (SSD) as pt received the letter on this date 11/19/2016 per phone interview with Social Security Administration on 11/26/2016 to provide various information needed before pt can receive benefits. Pt stated that once  he is receiving his SSD he would like to pursue looking for his own apartment as, "they know I'm trying not to drink- they drink. They know I'm trying not to smoke- they smoke," referring to pt's "daughter's mother" [name] and others at [employer] residence on [address].     Mental Status Exam:  APPEARANCE: Casual  ATTITUDE TOWARD INTERVIEWER: Cooperative  MOTOR ACTIVITY: WNL (within normal limits)  EYE CONTACT: Direct  SPEECH: Slurred  AFFECT: Sad, Angry and Pleasant  MOOD: Angry and Sad  THOUGHT PROCESS: Circumstantial and Goal directed  THOUGHT CONTENT: Negative Rumination  PERCEPTION: Within normal limits  CURRENT SUICIDAL IDEATION: patient denies  CURRENT HOMICIDAL IDEATION: Patient denies  ORIENTATION: Alert and Oriented X 3.  CONCENTRATION: WNL  MEMORY:   Recent: intact   Remote: intact  COGNITIVE FUNCTION: Average intelligence  JUDGMENT: Impaired -  mild  IMPULSE CONTROL: Poor  INSIGHT: Fair    Risk Assessment:  ASSESSMENT OF RISK FOR SUICIDAL BEHAVIOR  Changes in risk for suicide from baseline Formulation of Risk and/or previous intake, including newly identified risk, if any: none  Violence risk was assessed and No Change noted from baseline formulation of risk and/or previous assessment.    Session Content::  Pt texted wtr to let wtr know that he could not go to [facility] 10 AM financial information session because he had to go to [facility] outpatient chemical dependency group. Wtr called  patient back after he was done with group and found out from pt that he needed to go to group because he had a one-on-one meeting with his counselor after group because pt's counselor wanted to place [place] on an attendance contract and is not allowed to miss more than two groups a month. Wtr agreed that it was very important for pt to attend group today. Ademola informed wtr that he did sign the contract because he does like going to group and knew it was important for him to continue his chemical dependency treatment. Pt then stated, "Guess what! I got the award letter from [employer] and we won the appeal! We did it!" [employer] pt and stated that pt did a wonderful job Doctor, hospital for himself in the [employer].     Wtr picked pt up at pt's mother's residence later in the afternoon and pt was mildly intoxicated. Wtr asked pt if he had anything to drink after group and pt stated hesitantly that he had one beer. Pt then stated, "I can't just enjoy a happy occasion because of course my mother won't acknowledge it. The TV is more important to her than me". Pt explained that he had tried to speak with his mother who gestured for him to get out of the way of the television. Wtr tried to build some perspective taking for pt so that he might be able to have empathy for his mother rather than taking this gesture personally. Pt stated, "she never gave [place] attention. I understand she was going back to school to get her degree, but we Devan and his sister] never saw the benefits from that". Pt stated, "There is no one for me to talk to". Wtr offered support to pt and reminded him that the ACT Team was here to provide support for him. Pt was appreciative. Pt spoke for an extended period of time of problematic familial patterns. Wtr listened and re-directed pt minimally as pt appeared to need to be heard and understood rather than problem-solve. Pt thanked wtr.  Wtr commended pt for his resilience and the  parenting that wtr has witnessed first-hand with his daughter Stacy Gardnernicknamed "[name]".     Wtr and pt reviewed pt's calendar for March and planned to transport [name] to his [facility] appointment 11/22/2016.     Visit Diagnosis:    Major psychotic depression, recurrent- Primary ICD-10-CM: F33.3  ICD-9-CM: 296.34       Interventions:  Continued to develop therapeutic relationship  Reviewed and discussed Treatment Plan/Treatment Plan Review  Supportive Psychotherapy    Current Treatment Plan   Created/Updated On 09/02/2016   Next Treatment Plan Due 03/03/2017       Plan:  Psychotherapy continues as described in care plan; plan remains the same.    NEXT APPT: 11/22/2016, as per ACT calendar    Alinda MoneyShannon Fara Worthy, LMSW

## 2016-11-22 ENCOUNTER — Ambulatory Visit: Payer: Self-pay | Admitting: Psychiatry

## 2016-11-22 ENCOUNTER — Ambulatory Visit: Payer: Medicaid Other

## 2016-11-22 NOTE — Progress Notes (Signed)
[facility] MISSED/CANCELLED APPOINTMENT     Name: Sunnie NielsenRALPH D Stordahl  MRN: [id number]   DOB: 01-Sep-1976    Date of Scheduled Service: 11/22/2016    Mr. Kubota cancelled today's appointment with less than 24 hours notice.  I have spoken with the patient by phone.  The patient stated the reason for today's absence is he is feeling "very tired". Wtr observed that pt was slurring his words and wtr asked if pt had been drinking. Pt denied any alcoholc use. Pt stated that he [facility] this morning for his outpatient chemical dependency group. Pt was placed on an attendance contract with pt's primary counselor [facility] this week. Pt was scheduled to see Dr. Ferd GlassingFarrell at [facility] at 1:20pm that wtr will cancel and reschedule..    Additional Information:    Wtr will follow-up with pt on 11/25/2016.

## 2016-11-25 ENCOUNTER — Ambulatory Visit: Payer: Medicaid Other

## 2016-11-25 NOTE — Progress Notes (Signed)
[facility] Psychosocial Update     Psychosocial Update (for the preceding 6 months)  11/25/2016    Derek Walker is individually seen one to two times per week  for 15 - 60 minutes.     Type of visit: Face-to-face  Duration: 15 minutes    Focus: Stabilization of affective symptoms to promote successful community living and reduce frequency and intensity of acute episodes, recovery oriented activities, medical care, chemical dependency treatment, and probation    Current Medications  Current Outpatient Prescriptions   Medication Sig    ergocalciferol (DRISDOL) 50000 UNIT capsule Take 1 capsule (50,000 Units total) by mouth once a week   for 2 months, then get maintenance dose    mirtazapine (REMERON) 15 MG tablet Take 1 tablet (15 mg total) by mouth nightly    prazosin (MINIPRESS) 1 MG capsule Take 3 capsules (3 mg total) by mouth nightly    OLANZapine (ZYPREXA) 5 MG tablet Take 1 tablet (5 mg total) by mouth every morning    OLANZapine (ZYPREXA) 15 MG tablet Take 1 tablet (15 mg total) by mouth nightly    efavirenz-emtrictabine-tenofovir (ATRIPLA) 600-200-300 MG per tablet Take 1 tablet by mouth nightly on an empty stomach    risperiDONE (RISPERDAL) 0.5 MG tablet Take 1 tablet (0.5 mg total) by mouth daily    dronabinol (MARINOL) 5 MG capsule Take 1 capsule (5 mg total) by mouth 2 times daily (before meals)   Max daily dose: 10 mg    gabapentin (NEURONTIN) 100MG  capsule Take 1 capsule by mouth 3 times daily for 3 days, then take 2 capsules by mouth 3 times daily.     No current facility-administered medications for this visit.        Psychosocial History and Update  Medical History and Pain Update: [name] has no current pain complaints.  [facility] where he was last seen on 05/17/16 and started on Marinol 5 mg BID to stimulate appetite. Pt has recently missed an appointment on 11/22/2016 at [facility] with Dr. Ferd GlassingFarrell and will reschedule  visit.    Living Arrangements: Pt currently resides with pt's "daughter's mother", [name], at [employer] [address] Pt resides with [name], her teenage son, and their daughter, nicknamed "[name]". [name] reports going to his mother's residence due to stressors at his current residence. Pt has historically utilized both residences as respite depending on where he is currently residing.    Activities/Programs: Pt currently attends [facility] dependency [facility] on [address]. Pt enjoys helping "[name]" with her homework when she comes home from school, watching television, and reading books.    Current symptoms/impairments: [name] complains of feelings of anxiety, depression, outburst of anger, and fleeting repots of homicidal ideation without plan or intent.  He often exhibits low frustration tolerance, avoidance and failure to take responsibility for his actions. In addition, [name] is medication and treatment non adherent and endorses alcohol usage.    Substance Use:    Current use of alcohol or drugs: Yes    Past use of alcohol or drugs (note substances, approximate dates): Yes    Drug treatment history:  pproximate Dates:    January-Present day    [facility]    Additional Comments: [name] was recently put on an attendance contract at [facility] and is not to miss more than 2 days a month.      2016-2017, Summer 2017  Treatment and Facility:  [facility] [address] [place] [facility]                        Additional Comments: [place] discontinued [facility] secondary to being sanctioned by DSS for 6 months    Mental Status Exam  APPEARANCE: Well-groomed, Casual  ATTITUDE TOWARD INTERVIEWER: Cooperative  MOTOR ACTIVITY: WNL (within normal limits)  EYE CONTACT: Direct  SPEECH: Normal rate and tone  AFFECT: Full Range  MOOD: Normal  THOUGHT PROCESS: Circumstantial and Goal directed  THOUGHT CONTENT: No unusual  themes  PERCEPTION: Within normal limits  CURRENT SUICIDAL IDEATION: patient denies  CURRENT HOMICIDAL IDEATION: Patient denies  ORIENTATION: Alert and Oriented X 3.  CONCENTRATION: WNL  MEMORY:   Recent: intact   Remote: intact  COGNITIVE FUNCTION: Average intelligence  JUDGMENT: Impaired -  minimal  IMPULSE CONTROL: Fair  INSIGHT: Fair    Diagnosis  1. 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 PTSD (post-traumatic stress disorder) F43.10 309.81   3. Cannabis use disorder, moderate, dependence F12.20 304.30   4. Alcohol use disorder, mild, abuse F10.10 305.00       Without this treatment, is the patient likely to deteriorate, relapse, or require hospitalization? yes    Assessment  (progress towards goals and objectives)  Gains/Advances: Chaddrick advocated for himself in Clinical biochemist to pursue Social Security Disability (SSD). Jumar was awarded [employer] which will go into effect after phone interview 11/26/2016 to gather information to complete pt's case. Pt was sentenced to 3 yr probation on 10/09/16 and has attended probation consistently 1x a week since. Pt attended probation independently, without the ACT Team's support, on 11/20/2016 and is working toward sustaining independence by successfully attending appointments on his own. Pt has been attending [address] Chemical Dependency Treatment since 09/02/2016 semi-consistently 4x a week. Pt was motivated to attend chemical dependency treatment to lift DHS sanction, but once having found out that only inpatient treatment would do so which pt was unwilling to do, pt still continued to attend groups at [facility]. For his own recovery. Symeon has identified probation as a motivator to continue chemical dependency treatment.    Declines:   He is medication non-adherent and often reports that he is in need of medications when in fact, the medication have already been placed and awaiting his pick up. However, his pharmacy of choice, reports that he has not  reordered medications despite active orders being on file and in-spite of ACT team's ordering the medications for him and or offering to take him to pick up the medications. Narvel has failed to maintain scheduled appointments with the ACT Team as well as medical appointments, as recently as 11/22/2016. Emanuelle endorsed that he drinks alcohol daily and smokes marijuana, despite probation regulations and [address] CD treatment forbidding it.    Plan  As per Comprehensive Treatment Plan

## 2016-11-27 ENCOUNTER — Ambulatory Visit: Payer: Self-pay | Admitting: Infectious Diseases

## 2016-11-27 ENCOUNTER — Ambulatory Visit: Payer: Medicaid Other

## 2016-11-28 ENCOUNTER — Ambulatory Visit: Payer: Medicaid Other | Admitting: Psychiatry

## 2016-11-28 DIAGNOSIS — F333 Major depressive disorder, recurrent, severe with psychotic symptoms: Secondary | ICD-10-CM

## 2016-11-28 DIAGNOSIS — F323 Major depressive disorder, single episode, severe with psychotic features: Secondary | ICD-10-CM

## 2016-11-28 MED ORDER — NALTREXONE HCL 50 MG PO TABS *I*
50.0000 mg | ORAL_TABLET | Freq: Every day | ORAL | 5 refills | Status: DC
Start: 2016-11-28 — End: 2016-12-06

## 2016-11-28 MED ORDER — PRAZOSIN HCL 1 MG PO CAPS *I*
3.0000 mg | ORAL_CAPSULE | Freq: Every evening | ORAL | 5 refills | Status: DC
Start: 2016-11-28 — End: 2017-02-06

## 2016-11-28 MED ORDER — MIRTAZAPINE 15 MG PO TABS *I*
15.0000 mg | ORAL_TABLET | Freq: Every evening | ORAL | 5 refills | Status: DC
Start: 2016-11-28 — End: 2016-12-12

## 2016-11-28 MED ORDER — OLANZAPINE 5 MG PO TABS *I*
5.0000 mg | ORAL_TABLET | Freq: Every morning | ORAL | 5 refills | Status: DC
Start: 2016-11-28 — End: 2016-12-12

## 2016-11-28 MED ORDER — OLANZAPINE 15 MG PO TABS *I*
15.0000 mg | ORAL_TABLET | Freq: Every evening | ORAL | 5 refills | Status: DC
Start: 2016-11-28 — End: 2016-12-12

## 2016-11-28 NOTE — Progress Notes (Signed)
Behavioral Health Psychopharmacology Follow-up     Length of Session: 15 minutes.    Diagnosis Addressed    ICD-10-CM ICD-9-CM   1. Major psychotic depression, recurrent F33.3 296.34       Chief Complaint: Patient states "I am craving Alcohol"     There were no vitals taken for this visit.  Wt Readings from Last 3 Encounters:   05/17/16 70.7 kg (155 lb 13.8 oz)   10/11/15 74.9 kg (165 lb 1.6 oz)   09/21/15 74.4 kg (164 lb)       Recent History and Response to Medications    Pt is seen at his residence.  He reports that his depression and anxiety are in good control.  His appetite, energy and sleep are good. Nightmares are manageable.  Not feeling hopeless or helpless.  Enjoys going to his groups.   Not verbalizing anhedonia, denies all substance and Alcohol abuse.  Pt states he has been having significant alcohol cravings.  He is in CD tx with another system  and was advised that he would not be able to see their providers anytime soon.  We discussed med options to help with alcohol cravings and pt's preference was Vivitrol monthly injections though there is concern that his insurance will not pay for it as such we agreed to rx Naltrexone at this time.        Current use of alcohol or drugs: no    ENERGY: Good  SLEEP: Normal.    APPETITE: Good  WEIGHT: No Change  SEXUAL FUNCTION: not asked  ENJOYMENT/INTEREST: Fair    Review of Systems:  Pertinent items are noted in HPI.    Current Medications  Current Outpatient Prescriptions   Medication Sig    ergocalciferol (DRISDOL) 50000 UNIT capsule Take 1 capsule (50,000 Units total) by mouth once a week   for 2 months, then get maintenance dose    mirtazapine (REMERON) 15 MG tablet Take 1 tablet (15 mg total) by mouth nightly    prazosin (MINIPRESS) 1 MG capsule Take 3 capsules (3 mg total) by mouth nightly    OLANZapine (ZYPREXA) 5 MG tablet Take 1 tablet (5 mg total) by mouth every morning    OLANZapine (ZYPREXA) 15 MG tablet Take 1 tablet (15 mg total) by mouth  nightly    efavirenz-emtrictabine-tenofovir (ATRIPLA) 600-200-300 MG per tablet Take 1 tablet by mouth nightly on an empty stomach    risperiDONE (RISPERDAL) 0.5 MG tablet Take 1 tablet (0.5 mg total) by mouth daily    dronabinol (MARINOL) 5 MG capsule Take 1 capsule (5 mg total) by mouth 2 times daily (before meals)   Max daily dose: 10 mg    gabapentin (NEURONTIN) 100MG capsule Take 1 capsule by mouth 3 times daily for 3 days, then take 2 capsules by mouth 3 times daily.     No current facility-administered medications for this visit.        Side Effects  None    Mental Status  APPEARANCE: Appears stated age  ATTITUDE TOWARD INTERVIEWER: Cooperative  MOTOR ACTIVITY: WNL (within normal limits)  EYE CONTACT: Direct  SPEECH: Normal rate and tone  AFFECT: Full Range  MOOD: Euthymic  THOUGHT PROCESS: Normal  THOUGHT CONTENT: No unusual themes  PERCEPTION: Within normal limits  ORIENTATION: Alert and Oriented X 3.  CONCENTRATION: WNL  MEMORY:   Recent: intact   Remote: intact  COGNITIVE FUNCTION: Average intelligence  JUDGMENT: Intact  IMPULSE CONTROL: Good  INSIGHT: Fair    Risk  Assessment  Self Injury: Patient Denies  Suicidal Ideation: Patient Denies  Homicidal Ideation: Patient Denies  Aggressive Behavior: Patient Denies    If any of the answers above are Yes, is there access to lethal means?   N/A    [place] Scale administered? N/A    Results  All labs in the last 3 months:    Hospital Outpatient Visit on 11/04/2016   Component Date Value Ref Range Status    HIV-1 PCR log 11/04/2016 NEG  copies/mL Final    HIV-1 PCR 11/04/2016 NEG  copies/mL Final    Comment: Lower Limit of Detection: 20 copies/mL  Dynamic Range: 1.3 - 7.0 log copies/mL  Dynamic Range: 20 - 10,000,000 copies/mL    Accurate quantification not possible at < 20 copies/mL.  Specimens in the 1-19 copies/mL range are reported qualitatively  as POS<20.    This specimen was tested using the Roche COBAS Ampliprep COBAS Taqman HIV-1  version 2.0  test.      WBC 11/04/2016 4.0* 4.2 - 9.1 THOU/uL Final    RBC 11/04/2016 4.2* 4.6 - 6.1 MIL/uL Final    Hemoglobin 11/04/2016 13.9  13.7 - 17.5 g/dL Final    Hematocrit 11/04/2016 42  40 - 51 % Final    MCV 11/04/2016 101* 79 - 92 fL Final    MCH 11/04/2016 33* 26 - 32 pg/cell Final    MCHC 11/04/2016 33  32 - 37 g/dL Final    RDW 11/04/2016 12.4  11.6 - 14.4 % Final    Platelets 11/04/2016 185  150 - 330 THOU/uL Final    Seg Neut % 11/04/2016 59.3  % Final    Lymphocyte % 11/04/2016 24.3  % Final    Monocyte % 11/04/2016 14.0  % Final    Eosinophil % 11/04/2016 1.3  % Final    Basophil % 11/04/2016 0.8  % Final    Neut # K/uL 11/04/2016 2.4  1.8 - 5.4 THOU/uL Final    Lymph # K/uL 11/04/2016 1.0* 1.3 - 3.6 THOU/uL Final    Mono # K/uL 11/04/2016 0.6  0.3 - 0.8 THOU/uL Final    Eos # K/uL 11/04/2016 0.1  0.0 - 0.5 THOU/uL Final    Baso # K/uL 11/04/2016 0.0  0.0 - 0.1 THOU/uL Final    Nucl RBC % 11/04/2016 0.0  0.0 - 0.2 /100 WBC Final    Nucl RBC # K/uL 11/04/2016 0.0  0.0 - 0.0 THOU/uL Final    IMM Granulocytes # 11/04/2016 0.0  0.0 - 0.1 THOU/uL Final    IMM Granulocytes 11/04/2016 0.3  % Final    Sodium 11/04/2016 145  133 - 145 mmol/L Final    Potassium 11/04/2016 4.3  3.3 - 5.1 mmol/L Final    Chloride 11/04/2016 105  96 - 108 mmol/L Final    CO2 11/04/2016 27  20 - 28 mmol/L Final    Anion Gap 11/04/2016 13  7 - 16 Final    UN 11/04/2016 6  6 - 20 mg/dL Final    Creatinine 11/04/2016 0.88  0.67 - 1.17 mg/dL Final    GFR,Caucasian 11/04/2016 107  * Final    GFR,Black 11/04/2016 123  * Final    *UNITS=mL/min/1.73 square meters    Glucose 11/04/2016 78  60 - 99 mg/dL Final    Comment: Reference Ranges apply only to FASTING samples.    ADA Guidelines Blood Sugar Levels for Diagnosing Diabetes & Pre-diabetes  Normal: < 100 mg/dL  Impaired Fasting Glucose (IFG):  100-125 mg/dL  Diabetes:  > 126 mg/dL on two different occasions      Calcium 11/04/2016 9.6  9.0 - 10.3 mg/dL Final     Total Protein 11/04/2016 7.2  6.3 - 7.7 g/dL Final    Albumin 11/04/2016 4.6  3.5 - 5.2 g/dL Final    Bilirubin,Total 11/04/2016 0.3  0.0 - 1.2 mg/dL Final    AST 11/04/2016 49  0 - 50 U/L Final    ALT 11/04/2016 65* 0 - 50 U/L Final    Alk Phos 11/04/2016 95  40 - 130 U/L Final    T LYM %(CD3) 11/04/2016 76  54 - 87 % Final    T LYM #(CD3) 11/04/2016 688* 754 - 2810 cells/uL Final    CD4% 11/04/2016 24* 32 - 71 % Final    CD4# 11/04/2016 221* 496 - 2186 cells/uL Final    T Suppress %(CD8) 11/04/2016 48* 10 - 38 % Final    CD8# 11/04/2016 433  177 - 1137 cells/uL Final    CD4/CD8 11/04/2016 0.5* 0.7 - 3.0 Final    Interp,LMH 11/04/2016 see message   Final    Comment: Reference Range Note:  Pediatric reference values (0-16 years) taken from  [facility], Vol.130, Number 3.  Adult ranges developed in-house in October, 2002.      25-OH VIT D2 11/04/2016 <4  ng/mL Final    25-OH VIT D3 11/04/2016 <6  ng/mL Final    Comment: Methodology:  Liquid Chromatography-Tandem Mass Spectrometry  Test developed and characteristics determined by [facility]      25-OH Vit Total 11/04/2016 <10* 30 - 60 ng/mL Final    Comment: Reference Ranges:    < 20   ng/mL   Moderate Risk of Deficiency  20-29  ng/mL   Low Risk of Deficiency  30-60  ng/mL   Adequate  > 60   ng/mL   Potentially Harmful  The Total Vitamin D result is the sum of the two forms of 25-hydroxyvitamin  D (D2, from plant sources, and D3 from animal sources or endogenous  synthesis).         Current Treatment Plan   Created/Updated On 02/21/2016   Next Treatment Plan Due 08/22/2016       Assessment:  41 year old AA male with a MDD with psychotic features dx.  Pt is experiencing excessive Alcohol cravings and is requesting meds to curb these cravings.      Plan and Rationale:  D/C Risperdal 0.5 mg QD (reduce polypharmacy, pt is also on Zyprexa.)  Start Naltrexone 50 mg PO QD for Alcohol cravings.        Current Outpatient Prescriptions:      naltrexone (DEPADE) 50 MG tablet, Take 1 tablet (50 mg total) by mouth daily, Disp: 30 tablet, Rfl: 5    mirtazapine (REMERON) 15 MG tablet, Take 1 tablet (15 mg total) by mouth nightly, Disp: 30 tablet, Rfl: 5    OLANZapine (ZYPREXA) 15 MG tablet, Take 1 tablet (15 mg total) by mouth nightly, Disp: 30 tablet, Rfl: 5    OLANZapine (ZYPREXA) 5 MG tablet, Take 1 tablet (5 mg total) by mouth every morning, Disp: 30 tablet, Rfl: 5    prazosin (MINIPRESS) 1 MG capsule, Take 3 capsules (3 mg total) by mouth nightly, Disp: 90 capsule, Rfl: 5    ergocalciferol (DRISDOL) 50000 UNIT capsule, Take 1 capsule (50,000 Units total) by mouth once a week   for 2 months, then get maintenance  dose, Disp: 4 capsule, Rfl: 1    efavirenz-emtrictabine-tenofovir (ATRIPLA) 600-200-300 MG per tablet, Take 1 tablet by mouth nightly on an empty stomach, Disp: 30 tablet, Rfl: 3    dronabinol (MARINOL) 5 MG capsule, Take 1 capsule (5 mg total) by mouth 2 times daily (before meals)   Max daily dose: 10 mg, Disp: 60 capsule, Rfl: 5    gabapentin (NEURONTIN) 100MG capsule, Take 1 capsule by mouth 3 times daily for 3 days, then take 2 capsules by mouth 3 times daily., Disp: 180 capsule, Rfl: 2    Greater than 50% of visit time spent counseling/coordinating care. Time spent counseling:  15 minutes

## 2016-12-02 ENCOUNTER — Ambulatory Visit: Payer: Medicaid Other
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
## 2016-12-02 NOTE — Progress Notes (Signed)
Behavioral Health Progress Note     LENGTH OF SESSION: 20 minutes    Contact Type:  Location: Off Site    Face to Face     Treatment Problem #1 09/02/2016   Patient Identified Problem Alc/drug use at current residence     Treatment Goal #1 09/02/2016   Patient Identified Goal "I would like to be out on my own".          Progress toward goal(s): N/A - New goal    1 a. Measurable Objectives : I will [name]  Date established: 09/02/16  Target date: 03/03/17  Attained or Revised? new    1 b. Measurable Objectives : I will work on lifting DHS sanction so that funds are available for housing  Date established: 09/02/16  Target date: 09/20/16  Attained or Revised? new    1 c. Measurable Objectives : I will meet with ACT staff at least twice monthly to discuss and receive assistance in searching for safe and affordable housing, scheduling tours, making phone calls, completing applications, etc. As demonstrated by [name] having a safe and affordable place to live in the next six months  Date established: 09/02/16  Target date: 02/2017  Attained or Revised? continue      Progress towards this goal:Pt is currently fulfilling objective 1a. "Iwill successfully complete [facility] treatment" for Treatment Problem #1/Patient Identified goal. Wtr was informed by DSS that pt would need to attend inpatient chemical dependeny treatment in order to lift sanction and pt is unwilling to attend inpatient treatment, however has continued with his outpatient treatment at [facility]. Pt stated that once he is receiving his SSD he would like to pursue looking for his own apartment as, "they know I'm trying not  to drink- they drink. They know I'm trying not to smoke- they smoke," referring to pt's "daughter's mother" [name] and others at [employer] residence on [facility].       Mental Status Exam:  APPEARANCE: Casual  ATTITUDE TOWARD INTERVIEWER: Cooperative  MOTOR ACTIVITY: WNL (within normal limits)  EYE CONTACT: Indirect  SPEECH: Normal rate and tone  AFFECT: Full Range  MOOD: Depressed  THOUGHT PROCESS: Circumstantial and Goal directed  THOUGHT CONTENT: Negative Rumination  PERCEPTION: Within normal limits  CURRENT SUICIDAL IDEATION: patient denies  CURRENT HOMICIDAL IDEATION: Patient denies  ORIENTATION: Alert and Oriented X 3.  CONCENTRATION: WNL  MEMORY:   Recent: intact   Remote: intact  COGNITIVE FUNCTION: Average intelligence  JUDGMENT: Impaired -  mild  IMPULSE CONTROL: Poor  INSIGHT: Poor    Risk Assessment:  ASSESSMENT OF RISK FOR SUICIDAL BEHAVIOR  Changes in risk for suicide from baseline Formulation of Risk and/or previous intake, including newly identified risk, if any: none    Session Content::  Wtr arrived at pt's mother's residence. Wtr texted pt and pt came outside shortly thereafter. Wtr observed that pt appeared to be mildly intoxicated. Pt denied and later stated that he had "one beer" and was "just relaxing" after group (chemical dependency at [facility] Wtr asked if pt frequently drinks beer in the early afternoon to relax and pt stated that there were "not many things" that help him to relax. Wtr reviewed pt's identification of "going to the gym" to "take the edge off". Pt stated that he would still like to join the [facility]. Wtr stated that wtr could help to facilitate [employer] paperwork if pt would like  and pt thanked wtr.     Wtr delivered bus passes to pt to [facility]. Pt stated that group was going well and he continued to "share more". Wtr commended pt. Wtr and pt said goodbyes and wtr left.    Visit Diagnosis:    Major psychotic  depression, recurrent- Primary ICD-10-CM: F33.3  ICD-9-CM: 296.34       Interventions:  Continued to develop therapeutic relationship    Current Treatment Plan   Created/Updated On 09/02/2016   Next Treatment Plan Due 03/03/2017       Plan:  Psychotherapy continues as described in care plan; plan remains the same.    NEXT APPT: 12/04/2016, as per ACT calendar      Alinda MoneyShannon Brinda Focht, LMSW

## 2016-12-04 ENCOUNTER — Ambulatory Visit: Payer: Medicaid Other

## 2016-12-05 ENCOUNTER — Other Ambulatory Visit: Payer: Self-pay

## 2016-12-06 ENCOUNTER — Other Ambulatory Visit: Payer: Self-pay | Admitting: Psychiatry

## 2016-12-06 ENCOUNTER — Ambulatory Visit: Payer: Medicaid Other | Admitting: Psychiatry

## 2016-12-06 MED ORDER — NALTREXONE HCL 50 MG PO TABS *I*
50.0000 mg | ORAL_TABLET | Freq: Every day | ORAL | 5 refills | Status: DC
Start: 2016-12-06 — End: 2017-05-21

## 2016-12-09 NOTE — Progress Notes (Signed)
Clinical Management Note     Derek SextonRalph was seen by writer to deliver medication. He was cooperative in planning to meet up and was walking home from the bus after his group when [name] saw him. He was in good sprits and offered no immediate concerns.

## 2016-12-10 ENCOUNTER — Other Ambulatory Visit: Payer: Self-pay

## 2016-12-10 NOTE — Progress Notes (Signed)
Chart reviewed. Pt is due for a physical, message routed to ACT team nursing to encourage patient to schedule an appointment with [facility].

## 2016-12-11 ENCOUNTER — Ambulatory Visit: Payer: Medicaid Other

## 2016-12-11 DIAGNOSIS — F333 Major depressive disorder, recurrent, severe with psychotic symptoms: Secondary | ICD-10-CM

## 2016-12-11 NOTE — Progress Notes (Signed)
[facility] Team Clinical Management Note - Coordination of Care:      Needs Physical   Received: Today     [name], [name], RN  [name], [name]     Cc: [name], [name], RN; [name], [name], RN                [name]   Derek Walker is in need of a physical per [facility]. Please let us know how we can assist in having this appointment schedule based on Aria's preference of days/times.     Thank you       [name], RN

## 2016-12-12 ENCOUNTER — Other Ambulatory Visit: Payer: Self-pay | Admitting: Psychiatry

## 2016-12-12 ENCOUNTER — Ambulatory Visit: Payer: Medicaid Other

## 2016-12-12 DIAGNOSIS — F323 Major depressive disorder, single episode, severe with psychotic features: Secondary | ICD-10-CM

## 2016-12-12 MED ORDER — OLANZAPINE 5 MG PO TABS *I*
5.0000 mg | ORAL_TABLET | Freq: Every morning | ORAL | 5 refills | Status: DC
Start: 2016-12-12 — End: 2017-05-27

## 2016-12-12 MED ORDER — OLANZAPINE 15 MG PO TABS *I*
15.0000 mg | ORAL_TABLET | Freq: Every evening | ORAL | 5 refills | Status: DC
Start: 2016-12-12 — End: 2017-05-27

## 2016-12-12 MED ORDER — MIRTAZAPINE 15 MG PO TABS *I*
15.0000 mg | ORAL_TABLET | Freq: Every evening | ORAL | 5 refills | Status: DC
Start: 2016-12-12 — End: 2017-05-27

## 2016-12-12 NOTE — Progress Notes (Signed)
Behavioral Health Progress Note     LENGTH OF SESSION: 30 minutes    Contact Type:  Location: Off Site    Face to Face     Problem(s)/Goals Addressed from Treatment Plan:    Problem 1:   Treatment Problem #1 02/21/2016   Patient Identified Problem unstable housing       Goal for this problem:    Treatment Goal #1 02/21/2016   Patient Identified Goal "I will get my own apartment asap".         Progress towards this goal: Derek Walker presents as stable having completed his group at [facility] and an individual session with his counselor.  He then traveled with Probation officer to meet with his Derek Walker, Derek Walker.    Mental Status Exam:  APPEARANCE: Casual  ATTITUDE TOWARD INTERVIEWER: Cooperative  MOTOR ACTIVITY: WNL (within normal limits)  EYE CONTACT: Direct  SPEECH: Normal rate and tone  AFFECT: Appropriate  MOOD: Normal  THOUGHT PROCESS: Normal  THOUGHT CONTENT: No unusual themes  PERCEPTION: Within normal limits  CURRENT SUICIDAL IDEATION: patient denies  CURRENT HOMICIDAL IDEATION: Patient denies  ORIENTATION: Alert and Oriented X 3.  CONCENTRATION: WNL  MEMORY:   Recent: intact   Remote: intact  COGNITIVE FUNCTION: Average intelligence  JUDGMENT: Intact  IMPULSE CONTROL: Intact  INSIGHT: Fair    Risk Assessment:  ASSESSMENT OF RISK FOR SUICIDAL BEHAVIOR  Changes in risk for suicide from baseline Formulation of Risk and/or previous intake, including newly identified risk, if any: none    Session Content::  Derek Walker met with Derek Walker today for a scheduled visit with plans of Derek Walker picking him up from his substance abuse group at [facility] and then transporting him to his probation appointment.  Upon arrival at [facility] was not observed to waiting outside and therefore Derek Walker went inside the facility where Derek Walker was observed to be sitting in the waiting area.  After Derek Walker, structural, Derek Walker noted that he still had to meet with his individual counselor and therefore arrangements were made for Derek Walker to Derek Walker in the vehicle upon completion. Upon arriving at the vehicle he noted that he has been doing well and had plans to start Phase II in the coming week.  He reports that he has refrained from substance use and the people, places and things that would be disruptive to him.  He notes that he is feeling well about his choices and that things overall were going well for him.  He noted concerns in regards to his 50 yo daughter's disruptive behavior's at school reporting that she had been suspended for the 2nd day in a row, noting that he had just received a text upon leaving group.  He notes he is at a lost as to how to manage the behavior's. He notes the school has plans to have her tested and he was okay with this plan and that he has offered to go to her class to observe her behavior's.  He notes that he does not experience similar problems managing her behavior's as she does not typically act out in front of him but acknowledges that she is very active.  Upon arriving at probation, and due to the delay in his arrival for his 10:45 appointment subsequent to meeting his counselor and lack of parking, Derek Walker went to meet with his counselor independently.  Upon his return he noted that the meeting went well.  Of note Derek Walker noted that he had yet to receive his  Remeron and Zyprexa and therefore Derek Walker to follow-up with team to address the concern.  Derek Walker is able to receive text messages only.  There were no further ACT concerns.    Visit Diagnosis:      ICD-10-CM ICD-9-CM   1. Major psychotic depression, recurrent F33.3 296.34       Interventions:  Ensured linkage to probation, Supportive Therapy    Current Treatment Plan   Created/Updated On 02/21/2016   Next Treatment Plan Due 08/22/2016         Plan:  Psychotherapy continues as described in care plan; plan remains the same.    NEXT APPT: 12/12/16      Derek Hurter, LCSW

## 2016-12-12 NOTE — Progress Notes (Signed)
[facility] ACT Team Clinical Management Note     Date of Service: 3/39/2018  Duration: 5 minutes    Wtr texted with pt and at pt's request delivered medications to pt's mother's residence. Wtr also delivered 3 bus passes in a zippered black puch with a lanyard attached so that pt may wear around his neck as we had identified in problem-solving as pt reported last month's bus pass had gone through the washing machine in his wallet accidentally. Wtr confirmed appointment with pt 12/16/2016 to review ACT calendar for April. Pt agreed.    Alinda MoneyShannon Willliam Pettet, LMSW

## 2016-12-14 ENCOUNTER — Encounter: Payer: Self-pay | Admitting: Psychiatry

## 2016-12-16 ENCOUNTER — Ambulatory Visit: Payer: Medicaid Other | Attending: Psychiatry

## 2016-12-16 DIAGNOSIS — F4310 Post-traumatic stress disorder, unspecified: Secondary | ICD-10-CM | POA: Insufficient documentation

## 2016-12-16 DIAGNOSIS — F3289 Other specified depressive episodes: Secondary | ICD-10-CM | POA: Insufficient documentation

## 2016-12-18 ENCOUNTER — Ambulatory Visit: Payer: Medicaid Other

## 2016-12-18 NOTE — Progress Notes (Signed)
Behavioral Health Progress Note     LENGTH OF SESSION: 20 minutes    Contact Type:  Location: Off Site    Face to Face     Treatment Problem #1 09/02/2016   Patient Identified Problem Alc/drug use at current residence     Treatment Goal #1 09/02/2016   Patient Identified Goal "I would like to be out on my own".          Progress toward goal(s): N/A - New goal    1 a. Measurable Objectives : I will successfully complete [name]  Date established: 09/02/16  Target date: 03/03/17  Attained or Revised? new    1 b. Measurable Objectives : I will work on lifting DHS sanction so that funds are available for housing  Date established: 09/02/16  Target date: 09/20/16  Attained or Revised? new    1 c. Measurable Objectives : I will meet with ACT staff at least twice monthly to discuss and receive assistance in searching for safe and affordable housing, scheduling tours, making phone calls, completing applications, etc. As demonstrated by [name] having a safe and affordable place to live in the next six months  Date established: 09/02/16  Target date: 02/2017  Attained or Revised? continue      Progress towards this goal:Pt is currently fulfilling objective 1a. "Iwill successfully complete [facility] Chemical Dependency treatment" for Treatment Problem #1/Patient Identified goal. Wtr was informed by DSS that pt would need to attend inpatient chemical dependeny treatment in order to lift sanction and pt is unwilling to attend inpatient treatment, however has continued with his outpatient treatment at [facility]. Pt stated that once he is receiving his SSD he would like to pursue looking for his own apartment as, "they know I'm trying not  to drink- they drink. They know I'm trying not to smoke- they smoke," referring to pt's "daughter's mother" [name] and others at [employer] residence on [address].       Mental Status Exam:  APPEARANCE: Casual  ATTITUDE TOWARD INTERVIEWER: Cooperative  MOTOR ACTIVITY: WNL (within normal limits)  EYE CONTACT: Direct  SPEECH: Normal rate and tone  AFFECT: Anxious and Pleasant  MOOD: Anxious  THOUGHT PROCESS: Circumstantial  THOUGHT CONTENT: No unusual themes  PERCEPTION: Within normal limits  CURRENT SUICIDAL IDEATION: patient denies  CURRENT HOMICIDAL IDEATION: Patient denies  ORIENTATION: Alert and Oriented X 3.  CONCENTRATION: WNL  MEMORY:   Recent: intact   Remote: intact  COGNITIVE FUNCTION: Average intelligence  JUDGMENT: Impaired -  minimal  IMPULSE CONTROL: Poor  INSIGHT: Fair    Risk Assessment:  ASSESSMENT OF RISK FOR SUICIDAL BEHAVIOR  Changes in risk for suicide from baseline Formulation of Risk and/or previous intake, including newly identified risk, if any: none    Session Content::  Wtr arrived at [employer] residence, pt's "daughter's mother". Wtr knocked on the door and pt welcomed wtr inside. [name] was laying on the couch with a blanket over her and pt explained that [name] had just had surgery and was on bed rest. Wtr validated difficulty and provided support. Wtr and pt sat at table in same room as [name] with the TV on. Pt stated that he was feeling fatigued and had not gone to group that day. Wtr asked if pt had informed [facility] that he was unable to come and pt stated that he had. Pt stated that he was feeling tired because of helping [name] and his  daughter, Wille Celeste, in the home with tasks he would not normally have to complete. Pt stated that "on top of that our neighbors above [place], but especially the neighbors in the back, they have had parties all weekend, since Thursday, from 9PM and still going at 7 in the morning. We cannot wait to get out of here. It's not good for [place],  we couldn't sleep at all, there are people fighting right out here [pointing to front porch], fighting in the street and they had guns, we called the police but they never came". Wtr validated difficulty and unsafe situation that pt, pt's daughter, and [name] were in over the weekend. [name] added, "the one above [place] says she has a baby, but I don't know how with all that music going all night".    Wtr reviewed April calendar with [name]. Wtr and pt said goodbyes and wtr wished the family some peace and quiet. Wtr left.     Visit Diagnosis:    Major psychotic depression, recurrent- Primary ICD-10-CM: F33.3  ICD-9-CM: 296.34     Interventions:  Continued to develop therapeutic relationship  Reviewed and discussed Treatment Plan/Treatment Plan Review    Current Treatment Plan   Created/Updated On 09/02/2016   Next Treatment Plan Due 03/03/2017     Plan:  Psychotherapy continues as described in care plan; plan remains the same.    NEXT APPT: 12/18/2016, as per ACT calendar      [name], LMSW

## 2016-12-18 NOTE — Progress Notes (Signed)
[facility] MISSED/CANCELLED APPOINTMENT     Name: Derek Walker  MRN: [id number]   DOB: 01-Jun-1976    Date of Scheduled Service: 12/18/2016    Mr. Palinkas was a no show for today's appointment.  I have left the patient a message to contact me., discussed today's appointment with my supervisor. and wtr spoke with pt's treatment team in-person at [facility] where pt attends CD groups. Pt did not attend group on this date.    Additional Information:    Not applicable     [name], LMSW

## 2016-12-25 ENCOUNTER — Ambulatory Visit: Payer: Medicaid Other

## 2016-12-26 ENCOUNTER — Ambulatory Visit: Payer: Medicaid Other

## 2016-12-27 NOTE — Progress Notes (Signed)
Behavioral Health Progress Note     LENGTH OF SESSION: 20 minutes    Contact Type:  Location: Off Site    Face to Face     Problem(s)/Goals Addressed from Treatment Plan:  Treatment Problem #1 09/02/2016   Patient Identified Problem Alc/drug use at current residence     Treatment Goal #1 09/02/2016   Patient Identified Goal "I would like to be out on my own".        Progress toward goal(s): N/A - New goal    1 a. Measurable Objectives : I will successfully complete [name]ency treatment  Date established: 09/02/16  Target date: 03/03/17  Attained or Revised? new    1 b. Measurable Objectives : I will work on lifting DHS sanction so that funds are available for housing  Date established: 09/02/16  Target date: 09/20/16  Attained or Revised? new    1 c. Measurable Objectives : I will meet with ACT staff at least twice monthly to discuss and receive assistance in searching for safe and affordable housing, scheduling tours, making phone calls, completing applications, etc. As demonstrated by [name] having a safe and affordable place to live in the next six months  Date established: 09/02/16  Target date: 02/2017  Attained or Revised? continue      Progress towards this goal:Pt is currently fulfilling objective 1a. "Iwill successfully complete [facility] Chemical Dependency treatment" for Treatment Problem #1/Patient Identified goal. Wtr was informed by DSS that pt would need to attend inpatient chemical dependeny treatment in order to lift sanction and pt is unwilling to attend inpatient treatment, however has continued with his outpatient treatment at [facility]. Pt stated that once he is receiving his SSD he would like to pursue looking for  his own apartment as, "they know I'm trying not to drink- they drink. They know I'm trying not to smoke- they smoke," referring to pt's "daughter's mother" [name] and others at [employer] residence on [address]. Pt has reported that he has received his first SSD check in April 2018 but is not yet ready to look for his own apartment. Pt reported that 4/20 pt will graduate to Phase II of CD tx at [facility].      Mental Status Exam:  APPEARANCE: Well-groomed, Casual  ATTITUDE TOWARD INTERVIEWER: Cooperative  MOTOR ACTIVITY: WNL (within normal limits)  EYE CONTACT: Direct  SPEECH: Normal rate and tone  AFFECT: Pleasant  MOOD: Normal  THOUGHT PROCESS: Circumstantial and Goal directed  THOUGHT CONTENT: No unusual themes  PERCEPTION: Within normal limits  CURRENT SUICIDAL IDEATION: patient denies  CURRENT HOMICIDAL IDEATION: Patient denies  ORIENTATION: Alert and Oriented X 3.  CONCENTRATION: WNL  MEMORY:   Recent: intact   Remote: intact  COGNITIVE FUNCTION: Average intelligence  JUDGMENT: Intact  IMPULSE CONTROL: Fair  INSIGHT: Fair    Risk Assessment:  ASSESSMENT OF RISK FOR SUICIDAL BEHAVIOR  Changes in risk for suicide from baseline Formulation of Risk and/or previous intake, including newly identified risk, if any: none    Session Content::  Wtr arrived at [facility] and pt was waiting outside for wtr. Wtr and pt greeted one another. Wtr transported pt to probation office. Wtr informed pt that wtr would be unable to stay with pt but if pt informed wtr with reasonable notice that wtr would be able to attend next probation appointment with pt if pt would like. Pt stated that he would like wtr  to come to next probation appointment and would call/text wtr to inform of next probation appointment. Pt stated that he was assigned a new PO for the third time and was informed that he would be working with [employer], PO.     Pt stated that he was looking forward to transitioning to  Phase II at [facility] and attending only 2x a week vs. 5 days a week which is what Jayko has been doing on a fairly consistent basis for the last 2 months. Pt stated, "I'm just so tired in the mornings, it will be much better to go two times a week". Pt stated that today they did not discuss anything related to recovery and that his counselor, Alesia Banda, will sit back and let the discussion unfold without much redirection to conversation such as today an unrelated debate ensued. Pt stated that when the group does discuss chemical dependency and recovery that he enjoys the groups. Wtr reminded pt that if he was interested the ACT Team did have IDDT groups twice a week. Pt stated that once he completed [facility] he would consider "checking out" the ACT IDDT groups.     Pt stated that [name], pt's "daughter's mother", had leaned over to pick something up and tore some of her stitches out as she had just had surgery. Pt stated that taking care of [name] and some of her regular duties was fatiguing but was managing to cope "alright". Wtr asked if pt would like to meet at [facility] as pt has identified as "a safe place" previously. Pt stated that he would like that "very much" and wtr and pt confirmed community visit at [facility] for 12/30/2016 at 11:30am.     Wtr reviewed with pt that he has an Infectious Disease appointment on 01/02/2017 and pt stated that he would make it to his appointment. Wtr wished pt a good probation appointment and looked forward to hearing when next appointment was so that wtr could accompany pt if he wished. Pt thanked wtr and walked into probation building, wtr left.     Visit Diagnosis:    Major psychotic depression, recurrent- Primary ICD-10-CM: F33.3  ICD-9-CM: 296.34     Interventions:  Continued to develop therapeutic relationship    Current Treatment Plan   Created/Updated On 09/02/2016   Next Treatment Plan Due 03/03/2017       Plan:  Psychotherapy  continues as described in care plan; plan remains the same.    NEXT APPT: 12/30/2016, as per ACT calendar      [name], LMSW

## 2016-12-27 NOTE — Progress Notes (Signed)
Behavioral Health Progress Note     LENGTH OF SESSION: 15 minutes    Contact Type:  Location: Off Site    Face to Face     Problem(s)/Goals Addressed from Treatment Plan:  Treatment Problem #1 09/02/2016   Patient Identified Problem Alc/drug use at current residence     Treatment Goal #1 09/02/2016   Patient Identified Goal "I would like to be out on my own".          Progress toward goal(s): N/A - New goal    1 a. Measurable Objectives : I will successfully complete [place] Chemical Dependency treatment  Date established: 09/02/16  Target date: 03/03/17  Attained or Revised? new    1 b. Measurable Objectives : I will work on lifting DHS sanction so that funds are available for housing  Date established: 09/02/16  Target date: 09/20/16  Attained or Revised? new    1 c. Measurable Objectives : I will meet with ACT staff at least twice monthly to discuss and receive assistance in searching for safe and affordable housing, scheduling tours, making phone calls, completing applications, etc. As demonstrated by [name] having a safe and affordable place to live in the next six months  Date established: 09/02/16  Target date: 02/2017  Attained or Revised? continue      Progress towards this goal:Pt is currently fulfilling objective 1a. "Iwill successfully complete [facility] Chemical Dependency treatment" for Treatment Problem #1/Patient Identified goal. Wtr was informed by DSS that pt would need to attend inpatient chemical dependeny treatment in order to lift sanction and pt is unwilling to attend inpatient treatment, however has continued with his outpatient treatment at [facility] stated that once he is receiving his SSD he would like to pursue looking  for his own apartment as, "they know I'm trying not to drink- they drink. They know I'm trying not to smoke- they smoke," referring to pt's "daughter's mother" [name] and others at [employer] residence on [address]. Pt identified on this date 12/25/2016 that he is not yet ready to move into his own residence at this time. [name] reminded pt that in order to move onto Phase II in CD tx at [place] will need to adhere to attendance contract, not missing more than 1 or 2 groups a month. Pt agreed and stated that he would adhere to attendance policy. Pt reported no alc/drug use since 12/06/16 when pt was at mother's residence and pt's cousins were present, whom historically Tarvaris has drank/used with.    Mental Status Exam:  APPEARANCE: Well-groomed, Casual  ATTITUDE TOWARD INTERVIEWER: Cooperative  MOTOR ACTIVITY: WNL (within normal limits)  EYE CONTACT: Direct  SPEECH: Normal rate and tone  AFFECT: Pleasant  MOOD: Anxious  THOUGHT PROCESS: Circumstantial and Goal directed  THOUGHT CONTENT: No unusual themes  PERCEPTION: Within normal limits  CURRENT SUICIDAL IDEATION: patient denies  CURRENT HOMICIDAL IDEATION: Patient denies  ORIENTATION: Alert and Oriented X 3.  CONCENTRATION: WNL  MEMORY:   Recent: intact   Remote: intact  COGNITIVE FUNCTION: Average intelligence  JUDGMENT: Intact  IMPULSE CONTROL: Fair  INSIGHT: Fair    Risk Assessment:  ASSESSMENT OF RISK FOR SUICIDAL BEHAVIOR  Changes in risk for suicide from baseline Formulation of Risk and/or previous intake, including newly identified risk, if any: none    Session Content::  Wtr met with pt and pt's counselor, Joslyn Devon at [facility] for pt's outpatient CD tx. Leanne reviewed  with pt that he needed to adhere to attendance contract and not miss more than "1 or 2" groups per month. Pt stated that he would adhere to attendance policy. Leanne informed pt that if he were to complete present week thru Friday and following week of  4/16-4/20 that pt would move on to Phase II of CD tx and would be required to attend 2 days a week rather than 5 days a week. Pt stated that he was looking forward to attending only 2 days a week.     Wtr transported pt to [employer] house on [address]. Pt stated that he was still helping to care take [name] after her surgery and was feeling moderately fatigued from doing so. Wtr and pt reviewed importance of self-care and healthy coping skill of "taking the edge off" such as exercise and going to a "safe place", as pt has identified [employer] as such. Wtr and pt confirmed wtr would transport pt from [facility] to probation 12/26/2016. Wtr and pt said goodbyes. Wtr left.    Visit Diagnosis:    Major psychotic depression, recurrent- Primary ICD-10-CM: F33.3  ICD-9-CM: 296.34     Interventions:  Collateral contact with:  Joslyn Devon, pt's CD counselor at [facility]  Continued to develop therapeutic relationship  Motivational Interviewing  Reviewed and discussed Treatment Plan/Treatment Plan Review    Current Treatment Plan   Created/Updated On 09/02/2016   Next Treatment Plan Due 03/03/2017       Plan:  Psychotherapy continues as described in care plan; plan remains the same.    NEXT APPT: 12/26/2016, as per ACT calendar      Danise Edge, LMSW

## 2016-12-30 ENCOUNTER — Ambulatory Visit: Payer: Medicaid Other

## 2016-12-31 ENCOUNTER — Ambulatory Visit: Payer: Medicaid Other

## 2016-12-31 NOTE — Progress Notes (Signed)
Behavioral Health Progress Note     LENGTH OF SESSION: 60 minutes    Contact Type:  Location: Off Site    Face to Face, Collateral contact with pt's probation officer, Jonette Mate      Problem(s)/Goals Addressed from Treatment Plan:  Treatment Problem #1 09/02/2016   Patient Identified Problem Alc/drug use at current residence     Treatment Goal #1 09/02/2016   Patient Identified Goal "I would like to be out on my own".        Progress toward goal(s): N/A - New goal    1 a. Measurable Objectives : I will successfully complete [place] [facility] Dependency treatment  Date established: 09/02/16  Target date: 03/03/17  Attained or Revised? new    1 b. Measurable Objectives : I will work on lifting DHS sanction so that funds are available for housing  Date established: 09/02/16  Target date: 09/20/16  Attained or Revised? new    1 c. Measurable Objectives : I will meet with ACT staff at least twice monthly to discuss and receive assistance in searching for safe and affordable housing, scheduling tours, making phone calls, completing applications, etc. As demonstrated by [name] having a safe and affordable place to live in the next six months  Date established: 09/02/16  Target date: 02/2017  Attained or Revised? continue      Progress towards this goal:Pt is currently fulfilling objective 1a. "Iwill successfully complete [place] [facility] Dependency treatment" for Treatment Problem #1/Patient Identified goal. Wtr was informed by DSS that pt would need to attend inpatient chemical dependeny treatment in order to lift sanction and pt is unwilling to attend inpatient treatment, however has continued with his outpatient treatment at [place] stated that once  he is receiving his SSD he would like to pursue looking for his own apartment as, "they know I'm trying not to drink- they drink. They know I'm trying not to smoke- they smoke," referring to pt's "daughter's mother" [name] and others at [employer] residence on [facility] [address]. Pt has reported that he has received his first SSD check in April 2018 but is not yet ready to look for his own apartment. Pt reported that 4/20 pt will graduate to Phase II of CD tx at [facility].       Mental Status Exam:  APPEARANCE: Well-groomed, Casual  ATTITUDE TOWARD INTERVIEWER: Cooperative  MOTOR ACTIVITY: WNL (within normal limits)  EYE CONTACT: Direct  SPEECH: Normal rate and tone  AFFECT: Pleasant  MOOD: Anxious  THOUGHT PROCESS: Circumstantial and Goal directed  THOUGHT CONTENT: No unusual themes  PERCEPTION: Within normal limits  CURRENT SUICIDAL IDEATION: patient denies  CURRENT HOMICIDAL IDEATION: Patient denies  ORIENTATION: Alert and Oriented X 3.  CONCENTRATION: WNL  MEMORY:   Recent: intact   Remote: intact  COGNITIVE FUNCTION: Average intelligence  JUDGMENT: Intact  IMPULSE CONTROL: Fair  INSIGHT: Fair    Risk Assessment:  ASSESSMENT OF RISK FOR SUICIDAL BEHAVIOR  Changes in risk for suicide from baseline Formulation of Risk and/or previous intake, including newly identified risk, if any: none    Session Content::  Wtr picked pt up from [place] and transported pt to probation. Wtr and pt met with pt's Engineer, manufacturing systems, [employer]. Pt reported no drug/alc use and no police contact. Wtr transported pt back to pt's daughter's mother's ([name]) residence. Pt stated that he was feeling overwhelmed since [name]'s surgery and pt was left with  more household duties. Wtr provided support to pt. Wtr reviewed that pt had upcoming ID appointment and pt agreed to use his monthly bus pass to attend. Wtr and pt said goodbyes and wtr left.    Visit Diagnosis:      Major psychotic depression,  recurrent- Primary ICD-10-CM: F33.3  ICD-9-CM: 296.34       Interventions:  Collateral contact with:  Pt's PO, Samara Lane  Continued to develop therapeutic relationship  Provided therapeutic support during discussion of distressing/traumatic events and/or symptoms  Reviewed and discussed Treatment Plan/Treatment Plan Review    Current Treatment Plan   Created/Updated On 09/02/2016   Next Treatment Plan Due 03/03/2017       Plan:  Psychotherapy continues as described in care plan; plan remains the same.    NEXT APPT: 01/01/2017, as per ACT calendar      Danise Edge, LMSW

## 2017-01-01 ENCOUNTER — Telehealth: Payer: Self-pay

## 2017-01-01 ENCOUNTER — Ambulatory Visit: Payer: Medicaid Other

## 2017-01-01 NOTE — Telephone Encounter (Signed)
Writer left message for patient reminding him of his appointment with Mardene Celeste, NP at 1:00 on 04/19.

## 2017-01-02 ENCOUNTER — Ambulatory Visit: Payer: Medicaid Other | Admitting: Infectious Diseases

## 2017-01-02 ENCOUNTER — Ambulatory Visit: Payer: Medicaid Other

## 2017-01-02 NOTE — Progress Notes (Signed)
[facility] MISSED/CANCELLED APPOINTMENT     Name: Derek Walker  MRN: [id number]   DOB: May 11, 1976    Date of Scheduled Service: 01/02/2017    Mr. Ackerman was a no show for today's appointment.  I have left the patient a message to contact me., discussed today's appointment with my supervisor. and left a message with [name], pt's "daughter's mother" requesting that the patient contact me.    Additional Information:    Not applicable     [name], LMSW

## 2017-01-06 ENCOUNTER — Ambulatory Visit: Payer: Medicaid Other | Admitting: Infectious Diseases

## 2017-01-07 ENCOUNTER — Telehealth: Payer: Self-pay

## 2017-01-07 ENCOUNTER — Ambulatory Visit: Payer: Medicaid Other

## 2017-01-07 NOTE — Telephone Encounter (Signed)
This Clinical research associate spoke with pt regarding missed appointment, per pt he would like to reschedule pt transferred to scheduling.      Unita Detamore [address]. [place], [name]  Ext: [phone]  Pager: [phone] X [phone]

## 2017-01-08 ENCOUNTER — Ambulatory Visit: Payer: Medicaid Other | Admitting: Psychiatry

## 2017-01-13 ENCOUNTER — Ambulatory Visit: Payer: Medicaid Other
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
## 2017-01-14 ENCOUNTER — Ambulatory Visit: Payer: Medicaid Other | Attending: Psychiatry

## 2017-01-14 DIAGNOSIS — F333 Major depressive disorder, recurrent, severe with psychotic symptoms: Secondary | ICD-10-CM | POA: Insufficient documentation

## 2017-01-14 DIAGNOSIS — F4310 Post-traumatic stress disorder, unspecified: Secondary | ICD-10-CM | POA: Insufficient documentation
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
## 2017-01-15 ENCOUNTER — Telehealth: Payer: Self-pay

## 2017-01-15 ENCOUNTER — Ambulatory Visit: Payer: Medicaid Other

## 2017-01-15 NOTE — Telephone Encounter (Signed)
[facility] ACT Team Telephone Call     Date of call: 01/15/2017    Name: KRRISH FREUND   DOB: [birth year]   MRN: [id number]     Wtr called pt's mother's [name]) residence looking for pt. [name] stated that pt had gone "out with his cousins" and did not know when pt would return to meet with wtr. Wtr asked pt's mother to deliver message to [place] that wtr would like to meet with pt asap. Wtr thanked pt's mother and ended call.

## 2017-01-20 ENCOUNTER — Ambulatory Visit: Payer: Medicaid Other

## 2017-01-20 ENCOUNTER — Ambulatory Visit: Payer: Medicaid Other | Admitting: Psychiatry

## 2017-01-21 ENCOUNTER — Ambulatory Visit: Payer: Medicaid Other

## 2017-01-21 NOTE — Progress Notes (Signed)
[facility] MISSED/CANCELLED APPOINTMENT     Name: Derek Walker  MRN: [id number]   DOB: September 24, 1975    Date of Scheduled Service: 01/21/2017    Mr. Schellenberg cancelled today's appointment with less than 24 hours notice.  I have spoken with the patient by phone.  The patient stated the reason for today's absence is pt stated that his father's funeral was today and would not be able to meet with wtr. Wtr asked to reschedule for 01/22/2017 as wtr would like to deliver medications to pt and pt agreed..    Additional Information:    Not applicable
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
## 2017-01-22 ENCOUNTER — Ambulatory Visit: Payer: Medicaid Other

## 2017-01-23 ENCOUNTER — Ambulatory Visit: Payer: Medicaid Other
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
## 2017-01-28 ENCOUNTER — Ambulatory Visit: Payer: Medicaid Other

## 2017-01-30 ENCOUNTER — Ambulatory Visit: Payer: Medicaid Other | Admitting: Infectious Diseases

## 2017-01-30 ENCOUNTER — Encounter: Payer: Self-pay | Admitting: Psychiatry

## 2017-01-30 ENCOUNTER — Ambulatory Visit: Payer: Medicaid Other

## 2017-01-30 ENCOUNTER — Other Ambulatory Visit: Payer: Medicaid Other | Admitting: Psychiatry

## 2017-01-30 NOTE — Progress Notes (Signed)
[facility] MISSED/CANCELLED APPOINTMENT     Name: Derek NielsenRALPH D Walker  MRN: [id number]   DOB: October 25, 1975    Date of Scheduled Service: 01/30/2017     Derek Walker was not at the [address]. When the writer visited the patietn's mothers' house on [address], but was not also there.   I have discussed today's appointment with my team.     Additional Information:    Not applicable
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
## 2017-02-04 ENCOUNTER — Other Ambulatory Visit: Payer: Self-pay

## 2017-02-04 ENCOUNTER — Ambulatory Visit: Payer: Medicaid Other

## 2017-02-05 ENCOUNTER — Telehealth: Payer: Self-pay

## 2017-02-05 ENCOUNTER — Ambulatory Visit: Payer: Medicaid Other

## 2017-02-05 ENCOUNTER — Encounter: Payer: Self-pay | Admitting: Psychiatry

## 2017-02-05 ENCOUNTER — Ambulatory Visit: Payer: Medicaid Other | Attending: Psychiatry | Admitting: Psychiatry

## 2017-02-05 ENCOUNTER — Ambulatory Visit: Payer: Medicaid Other | Admitting: Psychiatry

## 2017-02-05 VITALS — BP 111/72 | HR 82 | Temp 97.5°F | Wt 165.2 lb

## 2017-02-05 DIAGNOSIS — Z23 Encounter for immunization: Secondary | ICD-10-CM | POA: Insufficient documentation

## 2017-02-05 DIAGNOSIS — R109 Unspecified abdominal pain: Secondary | ICD-10-CM | POA: Insufficient documentation

## 2017-02-05 DIAGNOSIS — R319 Hematuria, unspecified: Secondary | ICD-10-CM | POA: Insufficient documentation

## 2017-02-05 DIAGNOSIS — F4310 Post-traumatic stress disorder, unspecified: Secondary | ICD-10-CM

## 2017-02-05 DIAGNOSIS — Z0000 Encounter for general adult medical examination without abnormal findings: Secondary | ICD-10-CM | POA: Insufficient documentation

## 2017-02-05 DIAGNOSIS — B20 Human immunodeficiency virus [HIV] disease: Secondary | ICD-10-CM | POA: Insufficient documentation

## 2017-02-05 DIAGNOSIS — F333 Major depressive disorder, recurrent, severe with psychotic symptoms: Secondary | ICD-10-CM

## 2017-02-05 LAB — URINALYSIS WITH MICROSCOPIC
Blood,UA: NEGATIVE
Ketones, UA: NEGATIVE
Leuk Esterase,UA: NEGATIVE
Nitrite,UA: NEGATIVE
Protein,UA: NEGATIVE mg/dL
RBC,UA: 1 /hpf (ref 0–2)
Specific Gravity,UA: 1.005 (ref 1.002–1.030)
WBC,UA: 1 /hpf (ref 0–5)
pH,UA: 6 (ref 5.0–8.0)

## 2017-02-05 MED ORDER — CIPROFLOXACIN HCL 500 MG PO TABS *I*
500.0000 mg | ORAL_TABLET | Freq: Two times a day (BID) | ORAL | 0 refills | Status: DC
Start: 2017-02-05 — End: 2017-12-04

## 2017-02-05 MED ORDER — GABAPENTIN 100 MG PO CAPSULE *I*
100.0000 mg | ORAL_CAPSULE | Freq: Three times a day (TID) | ORAL | 2 refills | Status: DC
Start: 2017-02-05 — End: 2017-03-18

## 2017-02-05 NOTE — Telephone Encounter (Signed)
Pt's social worker, Alinda MoneyShannon Holcombe came in today requesting a same day appt for pt as he told her the yesterday he was voiding blood. She states he has missed recent appts with infectious disease and other medical appts due to recent deaths in the family. She stated that she will be able to pick him up and bring him to the appt this morining.    Dustin FlockShannon L Evolett Somarriba, RN

## 2017-02-05 NOTE — Assessment & Plan Note (Signed)
Pending CT of the abdomen and pelvis with contrast to rule out any serious pathology.

## 2017-02-05 NOTE — Progress Notes (Signed)
Behavioral Health Psychopharmacology Follow-up     Length of Session: 15 minutes.    Diagnosis Addressed    ICD-10-CM ICD-9-CM   1. PTSD (post-traumatic stress disorder) F43.10 309.81   2. Major psychotic depression, recurrent F33.3 296.34       Chief Complaint: Patient states "I"m actually doing pretty good..."     There were no vitals taken for this visit.  Wt Readings from Last 3 Encounters:   02/05/17 74.9 kg (165 lb 3.2 oz)   05/17/16 70.7 kg (155 lb 13.8 oz)   10/11/15 74.9 kg (165 lb 1.6 oz)       Recent History and Response to Medications  Patient came into [facility] for psychopharmacology follow up.   Patient informed the writer that he has been taking his medications regularly and he feels that his mood is "good..." He denied feeling sad/depressed. He stated that he did not have any difficulty with focus and/or concentration. He denied any signs/symptoms of psychosis. He stated that he is sleeping well. He denied any recent illicit and or drug use as he has been attending substance use program.     Patient stated that he hasn't been using the prazosin for sometime as the pharmacy has not filled the medication. He stated that currently he experienced nightmares about once week or so and he did not find the nightmares to be too distressing. We discussed about possible discontinuing the medication as he hasn't been taking the medication for sometime and the patient is agreeable with the plan      Current use of alcohol or drugs: no    ENERGY: Good  SLEEP: Normal.    APPETITE: Good  WEIGHT: No Change  SEXUAL FUNCTION: not asked  ENJOYMENT/INTEREST: Good    Review of Systems:  Pertinent items are noted in HPI.    Current Medications  Current Outpatient Prescriptions   Medication Sig    gabapentin (NEURONTIN) 100MG capsule Take 1 capsule (100 mg total) by mouth 3 times daily    mirtazapine (REMERON) 15 MG tablet Take 1 tablet (15 mg total) by mouth nightly    OLANZapine (ZYPREXA) 15 MG tablet Take 1 tablet  (15 mg total) by mouth nightly    OLANZapine (ZYPREXA) 5 MG tablet Take 1 tablet (5 mg total) by mouth every morning    naltrexone (DEPADE) 50 MG tablet Take 1 tablet (50 mg total) by mouth daily    prazosin (MINIPRESS) 1 MG capsule Take 3 capsules (3 mg total) by mouth nightly    efavirenz-emtrictabine-tenofovir (ATRIPLA) 600-200-300 MG per tablet Take 1 tablet by mouth nightly on an empty stomach    dronabinol (MARINOL) 5 MG capsule Take 1 capsule (5 mg total) by mouth 2 times daily (before meals)   Max daily dose: 10 mg    ciprofloxacin (CIPRO) 500 MG tablet Take 1 tablet (500 mg total) by mouth 2 times daily     No current facility-administered medications for this visit.        Side Effects  None    Mental Status  APPEARANCE: Appears stated age, Casual  ATTITUDE TOWARD INTERVIEWER: Cooperative  MOTOR ACTIVITY: WNL (within normal limits)  EYE CONTACT: Direct  SPEECH: Normal rate and tone  AFFECT: Full Range  MOOD: Euthymic  THOUGHT PROCESS: Normal  THOUGHT CONTENT: No unusual themes  PERCEPTION: Within normal limits  ORIENTATION: Alert and Oriented X 3.  CONCENTRATION: WNL  MEMORY:   Recent: intact   Remote: impaired remote memory  COGNITIVE FUNCTION: Average  intelligence  JUDGMENT: Impaired -  minimal  IMPULSE CONTROL: Good and Fair  INSIGHT: Good and Fair    Risk Assessment  Self Injury: Patient Denies  Suicidal Ideation: Patient Denies  Homicidal Ideation: Patient Denies  Aggressive Behavior: Patient Denies   Changes in risk for suicide form baseline Formulation of Risk and/or previous intake, including newly identified risk, if any: none.          If any of the answers above are Yes, is there access to lethal means?   No    [place] Scale administered? N/A    Results  Lab Results   Component Value Date    CHOL 197 05/17/2016    HDL 102 05/17/2016    LDLC 82 05/17/2016    TRIG 65 05/17/2016    CHHDC 1.9 05/17/2016    GLU 78 11/04/2016    HA1C 5.2 05/17/2016    WBC 4.0 (L) 11/04/2016    ASEGR 2.4  11/04/2016       No results for input(s): TSH in the last 8760 hours.        Lab results: 11/04/16  1241   Sodium 145   Potassium 4.3   Chloride 105   CO2 27   UN 6   Creatinine 0.88   GFR,Caucasian 107   GFR,Black 123   Glucose 78   Calcium 9.6   Total Protein 7.2   Albumin 4.6   ALT 65*   AST 49   Alk Phos 95   Bilirubin,Total 0.3           Current Treatment Plan   Created/Updated On 02/21/2016   Next Treatment Plan Due 08/22/2016       Assessment:  Derek Walker is a 41 year old african [place] male with a history of PTSD, depression, anxiety and nicotine dependence who has bee responding and tolerating his current psychiatric medication regimen without untoward side effects. His appetite has improved as well as sleeping well. He has been abstaining from any illicit drugs/alcohol and he will be graduating from the program in a couple of months. He did not endorse any signs/syptoms of psychosis including paranoia or a/hallucination. He is not endorsing and thoughts of self harm or SI/HI/VI. No safety or lethality concerns at this time. Will discontinue prazosin as the patient hasn't been taking the medication and he hasn't had frequent nightmares to warrant him to be on the medication. However, if the nightmares worsen, will consider restarting the medication.     Labs reviewed with patient:  07/14/15 QTc 442  Last AIMS exam performed on:   Reviewed and confirmed the PFSH/ROS with the patient.       Plan and Rationale:  1. Discontinue prazosin 57m   2. Continue with the current medication regimen of olanzapine 589mPO QAM and 1044mO QHS for psychosis, mirtazapine 30m26m QHS for depression/anxiety, gabapentin 200mg30mTID for anxiety  3. Continue to provide motivational interviewing related to smoking cessation  4. Continue to abstain from alcohol use  5. Provided informed consent for the above recommended psychiatric medications - following review of common risks/side effects and benefits.  6. Contact ACT emergency  on call phone for urgent issues.   7. Next visit on Tuesday 02/13/16 for medication and symptom management    RalphAltaired with above treatment plan as reviewed above.    Greater than 50% of visit time spent counseling/coordinating care. Time spent counseling:  15 minutes
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
## 2017-02-05 NOTE — Assessment & Plan Note (Addendum)
Based on the patient's presentation a pending urinalysis with urine culture has been ordered.  Included in the patient's differential diagnosis is the possibility of renal calculi at this time in addition to a broader differential in light of the patient's immunocompromised status with HIV.    - Urinalysis with microscopy; urine culture pending  - Ciprofloxacin 500 mg by mouth twice a day 5 days

## 2017-02-05 NOTE — Assessment & Plan Note (Signed)
The patient was last seen by infectious diseases on May 17, 2016.  He was noted at that time to currently be compliant with his HAART (Atripla).  He was noted to have a viral load that was undetectable in February 2018 however the patient's CD4 count was 221 with a noted CD4 percentage of 24.  His next appointment will be on Feb 06, 2017.    - And updated hepatitis A and B immunization today as per infectious disease recommendations as the patient missed his last scheduled immunization.

## 2017-02-06 ENCOUNTER — Ambulatory Visit: Payer: Medicaid Other | Attending: Infectious Diseases | Admitting: Infectious Diseases

## 2017-02-06 ENCOUNTER — Encounter: Payer: Self-pay | Admitting: Infectious Diseases

## 2017-02-06 ENCOUNTER — Ambulatory Visit: Payer: Medicaid Other

## 2017-02-06 VITALS — BP 112/82 | HR 118 | Temp 97.5°F | Resp 16 | Ht 71.0 in | Wt 164.7 lb

## 2017-02-06 DIAGNOSIS — Z79899 Other long term (current) drug therapy: Secondary | ICD-10-CM

## 2017-02-06 DIAGNOSIS — Z13220 Encounter for screening for lipoid disorders: Secondary | ICD-10-CM | POA: Insufficient documentation

## 2017-02-06 DIAGNOSIS — B20 Human immunodeficiency virus [HIV] disease: Secondary | ICD-10-CM | POA: Insufficient documentation

## 2017-02-06 DIAGNOSIS — E559 Vitamin D deficiency, unspecified: Secondary | ICD-10-CM | POA: Insufficient documentation

## 2017-02-06 DIAGNOSIS — Z131 Encounter for screening for diabetes mellitus: Secondary | ICD-10-CM | POA: Insufficient documentation

## 2017-02-06 LAB — AEROBIC CULTURE: Aerobic Culture: 0

## 2017-02-06 MED ORDER — BICTEGRAVIR-EMTRICITAB-TENOFOV 50-200-25 MG PO TABS *I*
1.0000 | ORAL_TABLET | Freq: Every day | ORAL | 5 refills | Status: DC
Start: 2017-02-06 — End: 2017-02-14

## 2017-02-06 NOTE — Progress Notes (Signed)
Clinical Management Note     Writer brought [name] to an appointment at his ID clinic. He was pleasant and psych stable. He revealed that he drank two glasses of wine the previous night and was feeling guilty about that. He was a bit anxious because of his daughter's bx at school and was taking the bus home which makes him nervous. However, he was in good bx control and at his psychiatric baseline. [facility].

## 2017-02-06 NOTE — Progress Notes (Signed)
Subjective    Derek Walker is a 41 y.o. old Male who presents to the Infectious Disease Clinic for follow-up care for HIV disease.    HPI:  HIV: Currently on HAART: Yes (see medication list below for complete list of medications). Per Self report adherence is> 95% Yes. Missed doses: Past 4 days? Yes.  Past 4 weeks? No. Barriers to adherence: No. Missed once dose because RX ran out Wednesday evening, missed last night. Plans to pick up medication today.    He was diagnosed in 2013 from heterosexual contact after he had oral thrush. He has been taking Atripla since diagnosis. Previously he was on Bactrim for prophylaxis, but off since 2014 since CD4 % is >14%    Hx of PTSD, Hx of major psychotic depression: States he is in care with [facility]. Feels the medication changes and regimen he is on has been helpful and that he has been successful in avoiding using any illicit substances. States at [employer] he has a case Investment banker, corporate who has also been helpful in coordinating his care    Any visits to ED/Specialists since last clinic visit? No    Review of Systems   Constitutional: Negative for chills, diaphoresis, fever, malaise/fatigue and weight loss.   HENT: Negative.    Eyes: Negative.    Respiratory: Negative.    Cardiovascular: Negative.    Gastrointestinal:        States he is no longer on Marinol. States it may have helped with appetite "a little" but doesn't think it made a significant difference.    Genitourinary: Negative.    Musculoskeletal: Negative.    Skin: Positive for rash (states he has eczema at baseline. He states his current symptoms are at baseline with using topical Steroid, states he had best response to ammonium lactate cream which is no longer covered by insurance. ). Negative for itching.   Neurological: Positive for tingling. Negative for dizziness, tremors, sensory change, speech change, focal weakness, seizures and weakness.   Endo/Heme/Allergies: Negative.    Psychiatric/Behavioral:  Negative for depression, hallucinations, substance abuse (states overall he is in remission at this time) and suicidal ideas. The patient is not nervous/anxious and does not have insomnia.        Allergies reviewed and updated today: Review of patient's allergies indicates no known allergies (drug, envir, food or latex).    Medications reviewed and updated today including OTC and Herbal Medications:   Outpatient Prescriptions Marked as Taking for the 02/06/17 encounter (Office Visit) with Jenna Luo, NP   Medication Sig Dispense Refill    gabapentin (NEURONTIN) 100MG  capsule Take 1 capsule (100 mg total) by mouth 3 times daily 30 capsule 2    ciprofloxacin (CIPRO) 500 MG tablet Take 1 tablet (500 mg total) by mouth 2 times daily 10 tablet 0    mirtazapine (REMERON) 15 MG tablet Take 1 tablet (15 mg total) by mouth nightly 30 tablet 5    OLANZapine (ZYPREXA) 15 MG tablet Take 1 tablet (15 mg total) by mouth nightly 30 tablet 5    OLANZapine (ZYPREXA) 5 MG tablet Take 1 tablet (5 mg total) by mouth every morning 30 tablet 5    naltrexone (DEPADE) 50 MG tablet Take 1 tablet (50 mg total) by mouth daily 30 tablet 5    efavirenz-emtrictabine-tenofovir (ATRIPLA) 600-200-300 MG per tablet Take 1 tablet by mouth nightly on an empty stomach 30 tablet 3     Medications marked discontinued were active as of  onset of Today's encounter.  Due to system issues, they may have been marked discontinued when renewed    Social History     Social History    Marital status: Divorced     Spouse name: N/A    Number of children: N/A    Years of education: N/A     Occupational History    Not on file.     Social History Main Topics    Smoking status: Current Every Day Smoker     Packs/day: 0.50     Years: 25.00     Types: Cigarettes     Start date: 931990    Smokeless tobacco: Never Used      Comment: 5 cigarettes/day    Alcohol use 3.6 oz/week     6 Cans of beer per week    Drug use: Yes     Special: Marijuana    Sexual  activity: Not Currently     Partners: Female     Birth control/ protection: None     Social History Narrative    Has 3 adult daughters and one young daughter (born ~2010) who lives with her mother in [place].      Possibly looking to go to [employer] to study car mechanics as of Sept 2017.        Number of sexual partners in the past 6 months - 0  Partner(s) knowledge of patient's HIV status - not applicable  Partner(s)' HIV status -  N/a  Partner's history of HIV testing - N/A  Using condoms - n/a      Objective :     Physical Exam   Constitutional: He is oriented to person, place, and time. He appears well-developed and well-nourished. No distress.   HENT:   Head: Normocephalic and atraumatic.   Right Ear: External ear normal.   Left Ear: External ear normal.   Mouth/Throat: Oropharynx is clear and moist. No oropharyngeal exudate.   Eyes: Right eye exhibits no discharge. Left eye exhibits no discharge. No scleral icterus.   Neck: Normal range of motion. No tracheal deviation present. No thyromegaly present.   Cardiovascular: Normal rate, regular rhythm and normal heart sounds.  Exam reveals no gallop and no friction rub.    No murmur heard.  Pulmonary/Chest: Effort normal and breath sounds normal. No stridor. No respiratory distress. He has no wheezes. He has no rales.   Abdominal: Soft. Bowel sounds are normal. He exhibits no distension and no mass. There is no tenderness. There is no rebound and no guarding.   Musculoskeletal: Normal range of motion. He exhibits no edema or deformity.   Lymphadenopathy:     He has no cervical adenopathy.   Neurological: He is alert and oriented to person, place, and time.   Skin: Skin is warm and dry. Rash (areas of hyperpigented skin along bilater lower extremeties. Mild scaling. ) noted. He is not diaphoretic. No erythema.   Vitals reviewed.      Vitals:  Vitals:    02/06/17 1403   BP: 112/82   Pulse: (!) 118   Resp: 16   Temp: 36.4 C (97.5 F)   TempSrc: Temporal   SpO2: 99%    Weight: 74.7 kg (164 lb 11.2 oz)   Height: 1.803 m (5\' 11" )     Body mass index is 22.97 kg/(m^2).  Pain    02/06/17 1403   PainSc:   0 - No pain     Results:  Results:  CD4 -  Lab Results   Component Value Date    CD4A 221 (L) 11/04/2016    CD4A 264 (L) 05/17/2016    CD4A 181 (L) 05/31/2015     Viral load -   Lab Results   Component Value Date    HIV NEG 11/04/2016    HIV NEG 05/17/2016    HIV NEG 10/11/2015         Assessment/Plan :   1. HIV: Here for follow-up for HIV disease   ART: On ART consisting of: Atripla (TDF/FTC/EFV). Stable, but patient agrees to [employer] to [employer] (TAF/FTC/BTG). Anticipate less risk of bone density issues with TAF based regimen (versus TDF). Additionally, although patient has tolerated EFV in Atripla well, BTG is generally expected to have less CNS side effects and less impact on cholesterol.     Safer sexual practices were discussed with the patient: Yes   CBC, CD4, viral load, CMP ordered today 02/06/17, but patient did not have these drawn   Medication adherence counseling was performed : Yes.    No need for OI prophylaxis at current T-cell and percentage.     2. Health maintenance:   Vaccinations: Needs HBV#3 at next visit.    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    Lipid screen: fasting panel done 05/17/16, WNL, re-ordered ordered today 02/06/17, but patient did not have this drawn   A1C done 05/17/16, WNL ordered today 02/06/17, but patient did not have this drawn   STD screen: Declines need for GC/CT screening, Syphilis screen sent 05/17/16, NEG. repeat ordered today 02/06/17, but patient did not have this drawn   UA WNL 02/05/17   TB Ag Stim test WNL 05/17/16, repeat ordered today 02/06/17, but patient did not have this drawn    3. Healthy Lifestyle Counseling:    The patient is advised to  quit smoking, begin progressive daily aerobic exercise program, follow a low fat, low cholesterol diet, attempt to lose weight, decrease or avoid alcohol intake, continue current medications, continue current healthy lifestyle patterns and return for routine annual checkups.    4. Hx of PTSD, substance abuse, anxiety with depression: Continue medications and care per mental health. Urine drug screen appropriate 05/17/16    5. Tobacco dependence: Three plus minutes of smoking cessation counseling done at prior visit- reinforced today.   Risks of smoking reviewed including possible lung disease (COPD, cancer) and cardiac disease.  Options of cessation aides reviewed including nicotine patch, hypnosis, and possible prescription medications.  Patient encouraged to follow-up with outpatient provider.    6. Hx of eczema: Continue topicals per PCP.     Return to clinic:in 3-4 month(s) or sooner if needed.    Jenna Luo, NP 02/06/2017 2:17 PM

## 2017-02-07 NOTE — Progress Notes (Signed)
Behavioral Health Progress Note     LENGTH OF SESSION: 45 minutes    Contact Type:  Location: Off Site    Face to Face     Problem(s)/Goals Addressed from Treatment Plan:  Treatment Problem #1 09/02/2016   Patient Identified Problem Alc/drug use at current residence     Treatment Goal #1 09/02/2016   Patient Identified Goal "I would like to be out on my own".        Progress toward goal(s): N/A - New goal    1 a. Measurable Objectives : I will successfully complete [name]  Date established: 09/02/16  Target date: 03/03/17  Attained or Revised? new    1 b. Measurable Objectives : I will work on lifting DHS sanction so that funds are available for housing  Date established: 09/02/16  Target date: 09/20/16  Attained or Revised? new    1 c. Measurable Objectives : I will meet with ACT staff at least twice monthly to discuss and receive assistance in searching for safe and affordable housing, scheduling tours, making phone calls, completing applications, etc. As demonstrated by Rayna Sextonalph having a safe and affordable place to live in the next six months  Date established: 09/02/16  Target date: 02/2017  Attained or Revised? continue      Progress towards this goal:Pt is currently fulfilling objective 1a. "Iwill successfully complete [facility]" for Treatment Problem #1/Patient Identified goal. Pt notified wtr that he has [facility]. Pt stated that he would like to continue residing at his "daughter's mother's" ([name]) house at this time as the environment has become milder in stressors recently as [name]'s son, Doreene ElandSajan is not permitted by [name]  to be home unless she is at the residence due to suspicion of reoccurring theft from household members including [name].      Mental Status Exam:  APPEARANCE: Well-groomed, Casual  ATTITUDE TOWARD INTERVIEWER: Cooperative  MOTOR ACTIVITY: WNL (within normal limits)  EYE CONTACT: Indirect  SPEECH: Normal rate and tone  AFFECT: Anxious and Pleasant  MOOD: Anxious and Normal  THOUGHT PROCESS: Circumstantial and Goal directed  THOUGHT CONTENT: Preoccupations  PERCEPTION: Within normal limits  CURRENT SUICIDAL IDEATION: patient denies  CURRENT HOMICIDAL IDEATION: Patient denies  ORIENTATION: Alert and Oriented X 3.  CONCENTRATION: WNL  MEMORY:   Recent: intact   Remote: intact  COGNITIVE FUNCTION: Average intelligence  JUDGMENT: Intact  IMPULSE CONTROL: Fair  INSIGHT: Fair    Risk Assessment:  ASSESSMENT OF RISK FOR SUICIDAL BEHAVIOR  Changes in risk for suicide from baseline Formulation of Risk and/or previous intake, including newly identified risk, if any: none    Session Content::  Wtr picked pt up at his "daughter's mother's" residence ([name]) on [address] and transported pt to [facility] for same-day [facility]IPS appointment. Appointment was made this morning of 02/05/2017 to address medical concern as pt reported on 02/04/2017  "a little scare" with seeing "blood in my urine" two days prior.  Pt identified that his youngest daughter, nicknamed "Wille Celesteooh" has been having difficult riding the bus. Pt stated that he has gone to [employer] and addressed with her teachers and has spoken to [facility] about this. [name] stated that the bus driver would not allow her to have a lollipop on the bus and has refused to ride the bus twice since resulting in [facility] or [name] going to pick her up from  school or taking the late bus home. Wtr complimented pt's parenting as appropriate.     Wtr asked pt if he was doing anything to celebrate Memorial Day. Pt stated that Memorial Day is the anniversary death of his Aunt who was "murdered" and  stated, "so the family all gets together at my mom's house and we just relax together". Wtr shared condolences and pt received well.     Wtr and pt arrived to [facility] and pt was able to meet with ACT Team NP for psychopharmacology appointment as pt missed 01/30/2017 psychopharm appointment due to dental emergency. ACT Team NP reported walking Daemyn over to MIPS after his psychopharm visit for his MIPS appointment. Pt attended appointment and a prescription was written for an antibiotic. Pt was unable to wait for prescription and wtr assured pt that ACT Team could deliver 02/06/2017 to pt when transporting him to his ID clinic appointment at 2pm. Pt thanked wtr. Wtr transported pt to his mother's residence on [address] Wtr and pt said goodbyes and wtr left.    Visit Diagnosis:    Major psychotic depression, recurrent- Primary ICD-10-CM: F33.3  ICD-9-CM: 296.34       Interventions:  Continued to develop therapeutic relationship  Parent strategies; specify: Pt identified that his youngest daughter, nicknamed "[name]" has been having difficult riding the bus. Pt stated that he has gone to [employer] and addressed with her teachers and has spoken to [facility] about this. [name] stated that the bus driver would not allow her to have a lollipop on the bus and has refused to ride the bus twice since resulting in [place] or [name] going to pick her up from school or taking the late bus home. Wtr complimented pt's parenting as appropriate.  Supportive Psychotherapy  Wtr transported pt to [facility] to attend same-day MIPS appointment to address medical concern as pt reported "a little scare" with seeing "blood in my urine" two days prior.     Current Treatment Plan   Created/Updated On 09/02/2016   Next Treatment Plan Due 03/03/2017       Plan:  Psychotherapy continues as described in care plan; plan remains the same. and ACT Team will transport pt 02/06/2017 to ID clinic appointment, deliver medications, and give pt a bus pass to  return home after appointment.    NEXT APPT: 02/06/2017, as per ACT calendar      [name], LMSW

## 2017-02-11 ENCOUNTER — Ambulatory Visit: Payer: Medicaid Other

## 2017-02-11 ENCOUNTER — Telehealth: Payer: Self-pay | Admitting: Infectious Diseases

## 2017-02-11 NOTE — Progress Notes (Signed)
[facility] MISSED/CANCELLED APPOINTMENT     Name: Sunnie NielsenRALPH D Metzger  MRN: [id number]   DOB: 12-01-1975    Date of Scheduled Service: 02/11/2017    Mr. Fahl cancelled today's appointment with less than 24 hours notice.  I have left the patient a message to contact me.    Additional Information:    Not applicable     Alinda MoneyShannon Blue Winther, LMSW

## 2017-02-11 NOTE — Telephone Encounter (Signed)
PC to patient, he was not home. Left genric message to call clinic back.     Nursing when he calls back: please see if he was able to pick up Biktarvy and see how he is doing.     Also please ask him to [facility] sometime next month to have all his labs done. He can do this after he sees Dr Ferd GlassingFarrell his PCP 6/8 if he would like to see if his PCP wants any labs (so he can have them all done at the same time).

## 2017-02-14 ENCOUNTER — Telehealth: Payer: Self-pay | Admitting: Infectious Diseases

## 2017-02-14 ENCOUNTER — Ambulatory Visit: Payer: Medicaid Other | Attending: Psychiatry

## 2017-02-14 DIAGNOSIS — F4310 Post-traumatic stress disorder, unspecified: Secondary | ICD-10-CM | POA: Insufficient documentation

## 2017-02-14 DIAGNOSIS — F333 Major depressive disorder, recurrent, severe with psychotic symptoms: Secondary | ICD-10-CM | POA: Insufficient documentation

## 2017-02-14 MED ORDER — BICTEGRAVIR-EMTRICITAB-TENOFOV 50-200-25 MG PO TABS *I*
1.0000 | ORAL_TABLET | Freq: Every day | ORAL | 5 refills | Status: DC
Start: 2017-02-14 — End: 2017-02-18

## 2017-02-14 NOTE — Telephone Encounter (Signed)
Nursing, please contact patient and let him know we are working on getting a prior authorization for [employer]ik[employer]vy. He should remain on Atripla until he gets the new medicine. Also please let him know [facility] advised us that they cannot fill this medication. I will send it to his [facility] [address]. If he would prefer to use a different pharmacy let me know and we can change the RX

## 2017-02-14 NOTE — Telephone Encounter (Signed)
-----   Message from Laroy AppleLisa A Newell, RN sent at 02/14/2017  4:27 PM EDT -----  Regarding: Bictegravir/Emtricitabine/Tenofovir/Alafen Script   Tresa EndoKelly  -   You recently sent a script for the above medication for [name] to [facility] [address] ( [facility] [place], [place] - [id number] Phone:  [phone]).  (02/06/17)     The medication is in need of a Prior Auth (PA) and to be requested by the nurses in your clinic vs myself, an ACT Team RN, and sent electronically to the above pharmacy to be filled.  I have cc'd my nursing partner as an Financial plannerYI, as well as Administrator, Civil Servicealph's Care Coordinator)  - I do not know which pool your nurses use in e record in order to forward to them directly.      Could you please assist?    Thank you very much

## 2017-02-17 NOTE — Telephone Encounter (Signed)
See other encounter.

## 2017-02-17 NOTE — Telephone Encounter (Signed)
Attempted to contact the patient, left message to call the clinic back.

## 2017-02-18 ENCOUNTER — Ambulatory Visit: Payer: Medicaid Other

## 2017-02-18 MED ORDER — EFAVIRENZ-EMTRICITAB-TENOFOVIR 600-200-300 MG PO TABS *I*
1.0000 | ORAL_TABLET | Freq: Every evening | ORAL | 3 refills | Status: DC
Start: 2017-02-18 — End: 2019-01-20

## 2017-02-18 NOTE — Addendum Note (Signed)
Addended by: Jenna LuoFARROW, Aamira Bischoff M on: 02/18/2017 02:23 PM     Modules accepted: Orders

## 2017-02-18 NOTE — Telephone Encounter (Signed)
Please let patient know the Derek BordersBiktarvy is not approved by his insurance.     He should continue on the Atripla for now. When I see him in Sept we will try again (I suspect it will be on the formulary at that time).

## 2017-02-18 NOTE — Telephone Encounter (Signed)
Left a message with Pts mom to call clinic back to discuss medications

## 2017-02-19 ENCOUNTER — Ambulatory Visit: Payer: Medicaid Other

## 2017-02-21 ENCOUNTER — Ambulatory Visit: Payer: Medicaid Other

## 2017-02-21 ENCOUNTER — Ambulatory Visit: Payer: Medicaid Other | Admitting: Psychiatry

## 2017-02-21 NOTE — Progress Notes (Signed)
Behavioral Health Progress Note     LENGTH OF SESSION: 15 minutes    Contact Type:  Location: Off Site    Face to Face     Problem(s)/Goals Addressed from Treatment Plan:  Treatment Problem #1 09/02/2016   Patient Identified Problem Alc/drug use at current residence     Treatment Goal #1 09/02/2016   Patient Identified Goal "I would like to be out on my own".        Progress toward goal(s): N/A - New goal    1 a. Measurable Objectives : I will successfully complete [name]  Date established: 09/02/16  Target date: 03/03/17  Attained or Revised? new    1 b. Measurable Objectives : I will work on lifting DHS sanction so that funds are available for housing  Date established: 09/02/16  Target date: 09/20/16  Attained or Revised? new    1 c. Measurable Objectives : I will meet with ACT staff at least twice monthly to discuss and receive assistance in searching for safe and affordable housing, scheduling tours, making phone calls, completing applications, etc. As demonstrated by [name] having a safe and affordable place to live in the next six months  Date established: 09/02/16  Target date: 02/2017  Attained or Revised? continue      Progress towards this goal:Pt is currently fulfilling objective 1a. "Iwill successfully complete [facility] treatment" for Treatment Problem #1/Patient Identified goal. Pt notified wtr that he has graduated onto Phase II at [facility] for CD treatment. Pt stated that he would like to continue residing at his "daughter's mother's" ([name]) house at this time as the environment has become milder in stressors recently as [name]'s son, Doreene Eland is not permitted by [name]  to be home unless she is at the residence due to suspicion of reoccurring theft from household members including Stace.      Mental Status Exam:  APPEARANCE: Disheveled  ATTITUDE TOWARD INTERVIEWER: Cooperative  MOTOR ACTIVITY: WNL (within normal limits)  EYE CONTACT: Direct  SPEECH: Soft and Minimal  AFFECT: Depressed and appeared physically ill, pt reported fever & chills  MOOD: Miserable related to pt's report of feeling ill  THOUGHT PROCESS: Circumstantial  THOUGHT CONTENT: No unusual themes  PERCEPTION: Within normal limits  CURRENT SUICIDAL IDEATION: patient denies  CURRENT HOMICIDAL IDEATION: Patient denies  ORIENTATION: Alert and Oriented X 3.  CONCENTRATION: WNL  MEMORY:   Recent: intact   Remote: intact  COGNITIVE FUNCTION: Average intelligence  JUDGMENT: Intact  IMPULSE CONTROL: Fair  INSIGHT: Fair    Risk Assessment:  ASSESSMENT OF RISK FOR SUICIDAL BEHAVIOR  Changes in risk for suicide from baseline Formulation of Risk and/or previous intake, including newly identified risk, if any: none  Violence risk was assessed and No Change noted from baseline formulation of risk and/or previous assessment.    Session Content::  Wtr arrived at pt's mother's residence to bring pt to MIPS appointment. Pt appeared chivering and disheveled. Pt refused MIPS apt and would also not go to urgent care upon wtr prompting. Pt stated that he would like to drink fluids and rest at home. Wtr rescheduled MIPS appointment for 03/07/2017 and informed pt that if he needed a same-day appointment that wtr could arrange. Wtr reviewed ED/urgent care if necessary and availability of ACT Emergency On-Call after hours. Wtr and pt said goodbyes and wtr left.    Visit Diagnosis:    Major psychotic depression, recurrent- Primary  ICD-10-CM: F33.3  ICD-9-CM: 296.34       Interventions:  Continued to develop therapeutic relationship  Provided therapeutic support during discussion of distressing/traumatic events and/or symptoms  Supportive  Psychotherapy  Reviewed going to Urgent Care or ED as necessary per pt's complaint of feeling physically ill    Current Treatment Plan   Created/Updated On 09/02/2016   Next Treatment Plan Due 03/03/2017       Plan:  Psychotherapy continues as described in care plan; plan remains the same.    NEXT APPT: 02/25/2017, as per ACT calendar      Alinda MoneyShannon Sherlon Nied, LMSW

## 2017-02-21 NOTE — Progress Notes (Signed)
Patient's Group Home called, patient is not feeling well but refusing to come in. Scheduled patient for office appointment on 6/22 with D. Orloff.   Elvis Coil to call in for same day appointment if patient continues not to feel better.

## 2017-02-24 ENCOUNTER — Ambulatory Visit: Payer: Medicaid Other

## 2017-02-25 ENCOUNTER — Ambulatory Visit: Payer: Medicaid Other | Admitting: Psychiatry

## 2017-02-25 ENCOUNTER — Encounter: Payer: Self-pay | Admitting: Psychiatry

## 2017-02-25 ENCOUNTER — Ambulatory Visit: Payer: Medicaid Other

## 2017-02-25 ENCOUNTER — Telehealth: Payer: Self-pay

## 2017-02-25 NOTE — Progress Notes (Signed)
I attempted to contact Mr. Villada by telephone at [phone] but there was no answer.  I then drove to [address] where he has been staying, but he was not home.  However, I did speak with his uncle, Mr. Fredderick SeveranceHorace Ellis, who told me that he hadn't seen Mr. Lemley all day and did not when he would return.  He agreed to take a message that the ACT team had visited.  F/U per ACT.

## 2017-02-25 NOTE — Telephone Encounter (Signed)
[facility] Team Telephone Call     Date of call: [phone] @ 1359    Call placed to [facility] to schedule CT abd/pelvis with contrast per Dr. Ferd GlassingFarrell request.  Appointment is scheduled for March 11, 2017 at 1330.  Pt is to not have food or fluids 2 hours prior to this image.  (11:30 am).  Appointment will take place at [address], check in on the 2nd floor.  Imaging phone  = [phone].  Writer to communicate this to Care Coordinator [facility] as discussed in ACT Am meeting this morning.  Writer to also remind of need for outstanding labs to be drawn as per MD order.      In basket sent to Dr. Ferd GlassingFarrell to determine if he would like pt MIPs follow up appt to be cancelled and rescheduled from 03/07/17 to a later date after the imaging has been completed on 6/26 and labs drawn, in order to complete follow up to pt original complaints as documented in MD note of 02/05/17.      Laroy AppleLisa A Evalee Gerard, RN
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
## 2017-02-27 ENCOUNTER — Ambulatory Visit: Payer: Medicaid Other
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
## 2017-03-04 ENCOUNTER — Ambulatory Visit: Payer: Medicaid Other

## 2017-03-04 NOTE — Progress Notes (Signed)
[facility] MISSED/CANCELLED APPOINTMENT     Name: Derek NielsenRALPH D Strehlow  MRN: [id number]   DOB: 1975/11/17    Date of Scheduled Service: 03/04/2017    [name] cancelled today's appointment with less than 24 hours notice.  I have spoken with the patient by phone.  The patient stated the reason for today's absence is that he left his mother's residence earlier than planned to go to probation..    Additional Information:    Wtr will schedule same-day MIPS appointment for pt on 03/05/2017 if possible, to address pt's reported eye concerns     Derek Walker, LMSW

## 2017-03-06 DIAGNOSIS — F4310 Post-traumatic stress disorder, unspecified: Secondary | ICD-10-CM

## 2017-03-06 NOTE — Progress Notes (Signed)
[facility] Team Clinical Management Note     Follow up regarding need to re scheudule 02/2217 MIPs appt until after abd ultrasound which is scheduled for the 26th of June at 1330 at [facility] on [address]:     FW: Follow up on CT Scan scheduling   Received: Today     [name], Kwanza Cancelliere A, RN  [name], [name]     Cc: [name], [name], RN; Wolocki, Avie ArenasEdith                [name], FYI.      Thank you for rescheduling Diontay's June 22 appt until after his ultrasound, please let us know if you need nursing assistance and I have cc'd Edie in the event she can assist.      Misty StanleyLisa         Previous Messages      ----- Message -----    From: Larene BeachFarrell, Enaame, MD    Sent: 02/28/2017 10:51 AM     To: Laroy AppleLisa A Xaiden Fleig, RN   Subject: RE: Follow up on CT Scan scheduling          That is fine the patient can be rescheduled.   ----- Message -----    From: [name], Mahir Prabhakar A, RN    Sent: 02/25/2017  2:06 PM     To: Randa LynnJ Steven Lamberti, MD, Alvie HeidelbergKarie J Ferera, RN, *   Subject: Follow up on CT Scan scheduling            FYI and Dr. Ferd GlassingFarrell, please advise - thanks all     [facility] Team Telephone Call      Date of call: [phone] @ [phone]     Call placed to [facility] to schedule CT abd/pelvis with contrast per Dr. Ferd GlassingFarrell request. Appointment is scheduled for March 11, 2017 at 1330. Pt is to not have food or fluids 2 hours prior to this image. (11:30 am). Appointment will take place at [address], check in on the 2nd floor. Imaging phone = [phone]. Writer to communicate this to Care Coordinator [facility] as discussed in ACT Am meeting this morning. Writer to also remind of need for outstanding labs to be drawn as per MD order.      In basket sent to Dr. Ferd GlassingFarrell to determine if he would like pt MIPs follow up appt to be cancelled and rescheduled from 03/07/17 to a later date after the imaging has been completed on 6/26 and labs drawn, in order to complete follow up to pt original  complaints as documented in MD note of 02/05/17.       Laroy AppleLisa A Bitha Fauteux, RN            Laroy AppleLisa A Sriyan Cutting, RN

## 2017-03-07 ENCOUNTER — Ambulatory Visit: Payer: Medicaid Other | Admitting: Psychiatry

## 2017-03-07 ENCOUNTER — Ambulatory Visit: Payer: Medicaid Other

## 2017-03-11 ENCOUNTER — Ambulatory Visit: Payer: Medicaid Other | Admitting: Radiology

## 2017-03-11 ENCOUNTER — Ambulatory Visit: Payer: Medicaid Other

## 2017-03-11 NOTE — Telephone Encounter (Signed)
Confirmed with pharmacy patient picked up Atripla 02/19/17

## 2017-03-11 NOTE — Progress Notes (Signed)
[facility] MISSED/CANCELLED APPOINTMENT     Name: Sunnie NielsenRALPH D Macera  MRN: [id number]   DOB: 1976/08/31    Date of Scheduled Service: 03/11/2017    [name] was a no show for today's appointment.  I have left a message with both pt's mother, Gavin PoundDeborah and pt's significant other, [name] requesting that the patient contact me. and rescheduled today's radiology appointment for Friday, July 13th at 12:30 PM    Additional Information:    N/A    Alinda MoneyShannon Domanick Cuccia, LMSW

## 2017-03-14 ENCOUNTER — Ambulatory Visit: Payer: Medicaid Other

## 2017-03-18 ENCOUNTER — Other Ambulatory Visit: Payer: Self-pay | Admitting: Psychiatry

## 2017-03-20 ENCOUNTER — Ambulatory Visit: Payer: Medicaid Other | Attending: Psychiatry | Admitting: Psychiatry

## 2017-03-20 DIAGNOSIS — F1010 Alcohol abuse, uncomplicated: Secondary | ICD-10-CM

## 2017-03-20 DIAGNOSIS — F4310 Post-traumatic stress disorder, unspecified: Secondary | ICD-10-CM | POA: Insufficient documentation

## 2017-03-20 DIAGNOSIS — F323 Major depressive disorder, single episode, severe with psychotic features: Secondary | ICD-10-CM | POA: Insufficient documentation

## 2017-03-20 DIAGNOSIS — F333 Major depressive disorder, recurrent, severe with psychotic symptoms: Secondary | ICD-10-CM | POA: Insufficient documentation

## 2017-03-21 ENCOUNTER — Encounter: Payer: Self-pay | Admitting: Psychiatry

## 2017-03-21 DIAGNOSIS — F1010 Alcohol abuse, uncomplicated: Secondary | ICD-10-CM | POA: Insufficient documentation
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
## 2017-03-24 ENCOUNTER — Ambulatory Visit: Payer: Medicaid Other
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
## 2017-03-27 ENCOUNTER — Ambulatory Visit: Payer: Medicaid Other

## 2017-03-27 ENCOUNTER — Encounter: Payer: Self-pay | Admitting: Gastroenterology

## 2017-03-28 ENCOUNTER — Ambulatory Visit: Payer: Medicaid Other | Admitting: Radiology

## 2017-03-28 ENCOUNTER — Ambulatory Visit: Payer: Medicaid Other

## 2017-03-31 ENCOUNTER — Ambulatory Visit: Payer: Medicaid Other | Admitting: Psychiatry

## 2017-03-31 ENCOUNTER — Telehealth: Payer: Self-pay | Admitting: Student in an Organized Health Care Education/Training Program

## 2017-03-31 NOTE — Telephone Encounter (Signed)
Collateral phone call was made with patient's mother in regards to coordinating psychopharmacology follow-up visit today. Mother reported that she had seen patient earlier in the morning, and he was doing well. She reported that she had not seen him recently/was not available for psychopharmacology follow-up visit today. Was informed that the ACT team would be following-up with him later in the week.

## 2017-04-01 ENCOUNTER — Ambulatory Visit: Payer: Medicaid Other

## 2017-04-01 DIAGNOSIS — F4310 Post-traumatic stress disorder, unspecified: Secondary | ICD-10-CM

## 2017-04-01 NOTE — Progress Notes (Signed)
Behavioral Health Progress Note     LENGTH OF SESSION: 15 minutes    Contact Type:  Location: Off Site    Direct, Face to Face     Problem(s)/Goals Addressed from Treatment Plan:    Problem 1:   Treatment Problem #1 02/21/2016   Patient Identified Problem unstable housing       Goal for this problem:    Treatment Goal #1 02/21/2016   Patient Identified Goal "I will get my own apartment asap".       Progress towards this goal: No change. Comment: Continue to work towards progression of goals established on treatment plan ,     Mental Status Exam:  APPEARANCE: Casual  ATTITUDE TOWARD INTERVIEWER: Cooperative  MOTOR ACTIVITY: WNL (within normal limits)  EYE CONTACT: Direct  SPEECH: Normal rate and tone and Soft  AFFECT: Neutral  MOOD: Normal  THOUGHT PROCESS: Normal  THOUGHT CONTENT: No unusual themes  PERCEPTION: Within normal limits  CURRENT SUICIDAL IDEATION: patient denies  CURRENT HOMICIDAL IDEATION: Patient denies  ORIENTATION: Alert and Oriented X 3.  CONCENTRATION: WNL  MEMORY:   Recent: intact   Remote: Not assessed   COGNITIVE FUNCTION: Average intelligence  JUDGMENT: Impaired -  minimal  IMPULSE CONTROL: Fair  INSIGHT: Fair    Risk Assessment:  ASSESSMENT OF RISK FOR SUICIDAL BEHAVIOR  Changes in risk for suicide from baseline Formulation of Risk and/or previous intake, including newly identified risk, if any: none  Violence risk was assessed and No Change noted from baseline formulation of risk and/or previous assessment.    Session Content:    Call placed to [facility] at his mother's home this morning in order to coordinate a mutually convenient time for a community home visit together, this will be our first time meeting.  Dail's mother Psychologist, clinical that he had already left for appointments today, though would be home later in the afternoon, she was agreeable to providing a message of writer's call to him should he return to the home sooner, writer's phone # provided for call back when available.      Later  in the afternoon, writer did place a second call to the home where Yuvan resides in order to determine if he had returned.  His mother Psychologist, clinical that he is outside the home and she will notify him of writer's second call.  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  Writer arrives to [employer] and a large contingent of individuals are located in [employer], he Producer, television/film/video as exiting the fleet vehicle extending his hand in a welcome as we introduce ourselves.  He is pleasant, engages easily and demonstrates direct eye contact as we speak to each other.     Writer speaks to Lipan regarding his previous missed CT Scan on 03/11/17 and need for a reschedule; provided education regarding the importance of this follow up request based on his complaints expressed to [facility] provider Dr. Margart Sickles on 02/05/17 (hematuria,  urgency, mild abdominal pain and L flank pain) , including the need for outstanding labs to be drawn.  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  He is reminded of his scheduled MIPs appointment for the next day and requests writer assist in  scheduling a cab on his behalf, Probation officer in agreement.  Writer also offered to assist him with obtaining his overdue labs at this visit, however he did so decline today, this could be based on the fact that we have just met today.    Marin is presented with quarters for his laundry as provided to Probation officer by Leoti in order to assist with completion of his laundry, he again extends his appreciation for the assistance.     Lastly, Probation officer reviews the new rotating schedule for ACT Visits amongst ACT Team members.  Writer advised Jaylynn that I look forward to meeting with him in the future and developing a therapeutic  relationship.  He was left in NAD and denied and safety concerns and or present needs at the time of the visit.       Visit Diagnosis:      ICD-10-CM ICD-9-CM   1. PTSD (post-traumatic stress disorder) F43.10 309.81       Interventions:   Continued to develop therapeutic relationship - our first time meeting  Motivational Interviewing  Writer to work on re schedule of missed appointment of 03/11/17 (CT ABD/Pelvis with contrast) as well as coordinate future MIPs appointment with MIPS provider until after CT has been obtained/completed.    Reminded Adit of the need for overdue labs to be completed, offered to assist him with getting to the lab, declines at this time.     Current Treatment Plan   Created/Updated On 02/21/2016   Next Treatment Plan Due 08/22/2016     Plan:  Psychotherapy continues as described in care plan; plan remains the same.    NEXT APPT: As per ACT Calendar, [id number]      Ermelinda Das, RN

## 2017-04-02 ENCOUNTER — Ambulatory Visit: Payer: Medicaid Other | Admitting: Psychiatry

## 2017-04-02 DIAGNOSIS — F4310 Post-traumatic stress disorder, unspecified: Secondary | ICD-10-CM
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
## 2017-04-02 NOTE — Progress Notes (Signed)
[facility] Team Clinical Management Note     Coordination with [facility]:       Need appoitment post 04/16/17   Received: Today     [name], Meenakshi Sazama A, RN  Kizzie IdeWolocki, Edith     Cc: Billie RuddyHolcombe, [name]; Ferera, Earney MalletKarie J, RN                Hi Malva CoganEdie   Can Satish please have an appointment for follow up after his ct/abd us is completed on 080118?     Thank you for your help!            Laroy AppleLisa A Ardian Haberland, RN

## 2017-04-02 NOTE — Progress Notes (Signed)
[facility] Clinical Management Note     Follow up regarding need to reschedule ABD U/S from NS by pt on 03/11/17:     Writer placed call to [facility] on [address] via [phone] to reschedule CT abd/pelvis with contrast due to pt no show on 03/11/17.  [facility] for [id number] at [phone].  Pt is to not have food or fluids 2 hours prior to this image.[phone]( [phone]).    Appointment to take place at [address], check in on the 2nd floor.  Writer to communicate this to Care Coordinator Derek Walker, labs continue to need to be drawn, will also advise of this and offer ACT Nursing Staff assistance to have labs completed as per MD order if helpful.      Writer cancelled [facility]IP[facility] appointment for today at 1520 as the U/S has yet to be completed.  Writer to coordinate a [facility] appointment for Derek Walker for a date after the 04/16/17 appointment and completion of imaging in order to complete follow up to pt original complaints as documented in MD note of 02/05/17.      [facility].  Mother answers, she is provided with update to cancellation of [facility] appointment for today, she advises she will communicate this to Derek Walker as he is not presently home.     PLAN:   Writer to coordinate [facility] appointment for post 8.1.18 us imaging completion   Writer to update Care Coordinator/ACT Team of need for outstanding labs and updated appointment date for ct abd/pelvis for pt to assist in care coordination/service linkage and success of the completion of the test/lab/appointments.     Derek AppleLisa A Kannon Baum, RN
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
## 2017-04-02 NOTE — BH Crisis Intervention (Signed)
[facility] Safety Plan     Step 1: Warning signs (thoughts, images, feelings, behaviors) that a crisis may be developing:  1. "distancing myself"  2. "drinking alcohol"  3. "very aggressive sometimes-- I snap-- I curse people out"    Step 2: Internal coping strategies - Things I can do to take my mind off my problems without contacting another person (distracting & calming activities):  1. "watch TV"  2. "sit out in the shade"    Step 3: People and social settings that provide distraction:   Name: Uncle Pop AKA Horis Phone: n/a (lives at pt's mother's residence)   Name: Pt pointed out two older men that are neighbors at pt's mother's residence Phone: n/a   Place: mother's residence     Step 4: People whom I can ask for help:  Therapist/Counselor: Alinda MoneyShannon Izumi Mixon, LMSW  Therapist/Counselor: Darliss CheneyJacquiest West, LCSW  Provider/Nurse Practitioner: Brand MalesMonika Kuwahara, NP    Step 5: Professionals or agencies I can contact during a crisis:  1.  Clinician Name: Alinda MoneyShannon Debanhi Blaker, LMSW  Phone: [phone]   Clinician Emergency Contact #: [phone]  2.  Clinician Name: [name], [place]CSW  Phone: [phone]   Clinician Emergency Contact #: [phone]  3.  Local Urgent Care or Emergency Room  4.  SUICIDE PREVENTION [facility] PHONE: [phone]  5.  LIFELINE OR MOBILE CRISIS: [phone] or 211  6.  POLICE: 911    Step 6: Making the environment safe (removing or limiting access to lethal means):  1. "stay away from [address] residence"  2. "stick to what I know-- [place] is a dangerous place"    The one thing that is most important to me and worth living for is:  "I want a better life. I want to be happy".    Patient Signature: ______________captured on paper document, see pt's documents__________________    Clinician Signature: Alinda MoneyShannon Anessa Charley, LMSW

## 2017-04-03 ENCOUNTER — Ambulatory Visit: Payer: Medicaid Other

## 2017-04-03 ENCOUNTER — Telehealth: Payer: Self-pay

## 2017-04-03 NOTE — Telephone Encounter (Signed)
-----   Message from Laroy AppleLisa A Newell, RN sent at 04/02/2017  9:57 PM EDT -----  Regarding: Need appoitment post 04/16/17   Hi Edie   Can Rayna Sextonalph please have an appointment for follow up after his ct/abd us is completed on 080118?    Thank you for your help!

## 2017-04-03 NOTE — Telephone Encounter (Signed)
Patient has an appointment scheduled on 05/09/17 at 4pm with deb orloff for fuv on ct and abd pain. Dr Ferd Glassingfarrell is no longer with us. So we are booking a little ways right now.

## 2017-04-08 ENCOUNTER — Ambulatory Visit: Payer: Medicaid Other

## 2017-04-08 DIAGNOSIS — F323 Major depressive disorder, single episode, severe with psychotic features: Secondary | ICD-10-CM
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
## 2017-04-09 NOTE — Progress Notes (Signed)
Behavioral Health Progress Note     LENGTH OF SESSION: 15 minutes    Contact Type:  Location: Off Site    Face to Face     Problem(s)/Goals Addressed from Treatment Plan:  Treatment Problem #1 09/02/2016   Patient Identified Problem Alc/drug use at current residence     Treatment Goal #1 09/02/2016   Patient Identified Goal "I would like to be out on my own".        Progress toward goal(s): no change      Mental Status Exam:  APPEARANCE: Casual, Disheveled  ATTITUDE TOWARD INTERVIEWER: Cooperative  MOTOR ACTIVITY: WNL (within normal limits)  EYE CONTACT: Indirect  SPEECH: Slowed  AFFECT: Anxious and Pleasant  MOOD: Anxious  THOUGHT PROCESS: WNL  THOUGHT CONTENT: WNL  PERCEPTION: Within normal limits  CURRENT SUICIDAL IDEATION: patient denies  CURRENT HOMICIDAL IDEATION: Patient denies  ORIENTATION: Alert and Oriented X 3.  CONCENTRATION: WNL  MEMORY:   Recent: intact   Remote: intact  COGNITIVE FUNCTION: Average intelligence  JUDGMENT: Impaired -  minimal  IMPULSE CONTROL: Fair  INSIGHT: Fair    Risk Assessment:  ASSESSMENT OF RISK FOR SUICIDAL BEHAVIOR  Changes in risk for suicide from baseline Formulation of Risk and/or previous intake, including newly identified risk, if any: none    Session Content::      Writer called and reached patient at his mothers house after several attempts. Writer spoke with Mother earlier in the day and she told [name] patient was out of the house for an appointment and that he would Regulatory affairs officercall writer when he got back. Writer did not hear back. Writer called Mother back at mother states patient tried calling several times however [name] did not receive any phone calls. Mother states the patient has gone again as he went to group but she will have patient call writer when he returns which will be approximately around 1500. Patient calls [name] and informs that he is back at mothers house.     Writer arrives at mothers home and [name] came out into the driveway despite the  rain and no coverage was provided. Patient was excepting and appropriately greeted [name] as this was the second introduction and encounter with [name]. Writer gave patient medication refills and 2 bus passes. Patient states "I couldve really used the bus passes earlier today I ended up walking in the rain". Patient then said "Carollee HerterShannon must be on vacation". Writer informed that Carollee HerterShannon was not on vacation and that other ACT team members will be rotating visits. Patient verbalized understanding and seemed "OK" with this. No acute concerns noted. Patient voices no further questions or concerns at this time.    Visit Diagnosis:    Major psychotic depression, recurrent- Primary ICD-10-CM: F33.3  ICD-9-CM: 296.34       Interventions:  Continued to develop therapeutic relationship      Current Treatment Plan   Created/Updated On 09/02/2016   Next Treatment Plan Due 03/03/2017         Plan:  CONTINUE MEDS as established    NEXT APPT: 04/01/2017, as per ACT calendar    Alvie HeidelbergKarie J Ferera, RN

## 2017-04-11 ENCOUNTER — Encounter: Payer: Self-pay | Admitting: Gastroenterology

## 2017-04-11 ENCOUNTER — Ambulatory Visit: Payer: Medicaid Other

## 2017-04-15 NOTE — Progress Notes (Signed)
Behavioral Health Progress Note     LENGTH OF SESSION: 25 minutes    Contact Type:  Location: Off Site    Face to Face     Problem(s)/Goals Addressed from Treatment Plan:    Problem 1:   Treatment Problem #1 02/21/2016   Patient Identified Problem unstable housing       Goal for this problem:    Treatment Goal #1 02/21/2016   Patient Identified Goal "I will get my own apartment asap".         Progress towards this goal: see session content for details     Mental Status Exam:  APPEARANCE: Well-groomed, Casual  ATTITUDE TOWARD INTERVIEWER: Cooperative  MOTOR ACTIVITY: WNL (within normal limits)  EYE CONTACT: Direct  SPEECH: Normal rate and tone  AFFECT: Pleasant  MOOD: Anxious  THOUGHT PROCESS: Goal directed  THOUGHT CONTENT: No unusual themes  PERCEPTION: No evidence of hallucinations  CURRENT SUICIDAL IDEATION: patient denies  CURRENT HOMICIDAL IDEATION: Patient denies  ORIENTATION: Alert and Oriented X 3.  CONCENTRATION: Fair  MEMORY:   Recent: intact   Remote: intact  COGNITIVE FUNCTION: Average intelligence  JUDGMENT: Impaired -  mild  IMPULSE CONTROL: Fair  INSIGHT: Fair    Risk Assessment:  ASSESSMENT OF RISK FOR SUICIDAL BEHAVIOR  Changes in risk for suicide from baseline Formulation of Risk and/or previous intake, including newly identified risk, if any: none    Session Content::  Writer met with Derek Walker for scheduled community visit. After some back and forth attempts / phone calls, Writer was able to meet Derek Walker at his mother's home. This was Writer's first time meeting with Derek Walker; despite his unfamiliarity with Writer he was quite open. Derek Walker discussed some of the stressors he is currently experiencing around his significant other who is having some legal problems. Derek Walker reports that he may be taking custody of his daughter and his partner's teenage son. He is thinking about how he will navigate this transition both financially and emotionally. He reports ongoing conflict with his partner Derek Walker and is caring  for himself by taking some space from her at this time. Although he feels safe at his mother's home, he acknowledges that his mother wants him "out, like now". He has not been actively looking for apartments but was receptive to ACT providing him with an affordable housing listing. He expressed pride as he will be graduating to the next phase at [facility]. Writer and Derek Walker discussed good self care in the context of an appt for a CT scan that he has missed. Writer advised that it is scheduled for 8/1 and stated that ACT will support him by transporting him there. Writer also noted that as an incentive for practicing self-care for his medical needs, ACT will provide him with a grocery gift card. Derek Walker was pleased to hear this but stated he is self-motivated to attend the appointment and stated he will be available at his mother's home that morning for transport.     Visit Diagnosis:    No diagnosis found.    Interventions:  Collateral contact with:  partner Derek Walker   Continued to develop therapeutic relationship  Supportive Psychotherapy    Current Treatment Plan   Created/Updated On 02/21/2016   Next Treatment Plan Due 08/22/2016         Plan:  Psychotherapy continues as described in care plan; plan remains the same.    NEXT APPT: per ACT calendar       Maudry Diego,

## 2017-04-15 NOTE — BH Treatment Plan (Addendum)
[facility] Treatment Plan     Date of Plan:   Created/Updated On 03/27/2017   FROM 03/27/2017      Created/Updated On 03/27/2017   Next Treatment Plan Due 09/27/2017       Diagnostic Impression  Major psychotic depression, recurrent- Primary ICD-10-CM: F33.3  ICD-9-CM: 296.34       Strengths  Strengths derived from the assessment include: Derek Walker is an intelligent man who is motivated to reduce his suffering. At times this includes but is not limited to self-care. He has been somewhat consistent when he knows he will be meeting with that individual. He is connected with the mother of his children and has supportive family in the community. Pt identifies that some of his other strengths include, "good listener, fast-learner, able to comprehend and apply knowledge, patient, positive family relationships".       Problem Areas  *At least one problem must be targeted toward risk reduction if Formulation of Risk or any other previous exam indicated special monitoring or intervention for suicide and/or violence risk indicated.    PROBLEM AREAS (choose and describe relevant):  THOUGHT: Negative Rumination, "making the situation worse than what it is"  MOOD:Derek Walker has a diagnosis of depression and his alcohol and drug use exasperate his mood fluctuations.   BEHAVIOR: "At times, I can have a sailor's mouth and be very outspoken when having conflict with others, the way I present it is a problem- I need to work on that"  - Pt would like to attend his medical appointments rather than evading taking care of his health out of fear.  HOUSING: "Would like to be out on my own", pt would like to live in his own apartment to maintain a drug/alc-free environment  ECONOMIC: Pt would like assistance in budgeting his income as he has reported having trouble doing so.  LEGAL: Pt is on probation with PO officer, Derek Walker.  ________________________________________________________________  Treatment Problem #1 03/27/2017,  continued from 02/21/2016   Patient Identified Problem Alc/drugs and instability at pt's s/o's and mother's residences        Treatment Goal #1 03/27/2017, continued from 02/21/2016   Patient Identified Goal "I will get my own apartment asap".       The rationale for addressing this problem is that resolving it will (select all that apply):  Reduce symptoms of disorder, Reduce functional impairment associated with disorder, Decrease likelihood of hospitalization, Facilitate transfer skills learned in therapy to everday life, Is a key motivational factor for the patient's participation in treatment, Reduce risk for suicide and Reduce risk for violence      Progress toward goal(s): N/A - New goal    1 a. Measurable Objectives : Derek Walker will work with ACT Team 3x a month to form a monthly budget, check-in with ongoing management of finances throughout month, and to note any adjustments that may need to be made after reviewing the month's income and overall expenses   Date established: 03/27/2017   Target date: 09/27/2017   Attained or Revised? new    1 b. Measurable Objectives : I will meet with ACT staff at least twice monthly to discuss and receive assistance in searching for safe and affordable housing, scheduling tours, making phone calls, completing applications, etc. As demonstrated by Derek Walker having a safe and affordable place to live in the next 6 months.   Date established: 09/02/2016 and continued on from this date 03/27/2017   Target date: 09/27/2017   Attained or Revised? Revised  1 c. Measurable Objectives : Derek Walker will continue to utilize clinical appointments with the ACT Team to address communication and conflict with s/o, pt's mother, and other family  members to assist pt in establishing healthy boundary lines and communication styles   Date established: 03/27/2017   Target date: 09/27/2017   Attained or Revised?  new      ..........................................................................................................................................    Treatment Problem #2 03/27/2017   Patient Identified Problem poor physical and mental health       Treatment Goal #2 03/27/2017   Patient Identified Goal "I will get my health back in order (attending medical and clinical appointments, nutrition/weight)".     The rationale for addressing this problem is that resolving it will (select all that apply):  Reduce symptoms of disorder, Reduce functional impairment associated with disorder, Decrease likelihood of hospitalization, Facilitate transfer skills learned in therapy to everday life, Is a key motivational factor for the patient's participation in treatment, Reduce risk for suicide and Reduce risk for violence    Progress toward goal(s): No change. Comment: Pt has had brief interludes of sobriety and attending clinical appointments but has continued to struggle to attend medical appointments and this continued avoidance has become a point of excessive anxiety and stress for pt due to ongoing health concerns that are unaddressed. Pt has continued to move towards his goal of successfully completing outpatient CD treatment at [facility]. Pt is in Phase II of treatment at [facility] as of this date 03/27/2017.       2 a. Measurable Objectives : I [facility]                        Date established: 09/02/16                        Target date: 09/27/2017                        Attained or Revised? Continued    2 b. Measurable Objectives : Derek Walker will discuss  barriers to attending medical and clinical appointments and work with ACT Team to overcome these challenges to increase pt's compliance with treatment recommendation at least 1x a month.   Date established: 03/27/2017   Target date: 09/27/2017   Attained or Revised? new    2 c. Measurable  Objectives : Derek Walker will continue to explore and discuss cognitive dissonance between continued use of drug/alc and maintaining sobriety and the effects of pt's alc/drug use on his physical and mental health.   Date established: 03/27/2017   Target date: 09/27/2017   Attained or Revised? new    ______________________________________________________________________      Plan  TREATMENT MODALITIES:  Individual psychotherapy for 20-74min 6 times per monthweeks with ACT Team members; A minimum of 5 of those visits in the home or community.    Psychopharmacology visits monthly with NP and quarterly with ACT Team Psychiatrist     Derek Walker is encouraged to attend group psychotherapy as offered through ACT (but is currently (as of 09/02/16) attending Chemical Dependency groups at [facility])    ACT Team members will provide symptom monitoring as needed during our visits, as well as providing psychoeducation.    ACT staff will provide crisis management if needed and Derek Walker has access to ACT 24/7 via the emergency phone line.  [employer] Funds may be used to increase engagement and support Derek Walker's  financial needs as seen fit by the discretion of the ACT Team collaboratively.      Interventions:      ACT staff will provide assessment of mental status and medication response, and lethality assesssment at each visit   ACT staff will provide crisis intervention via 1:1 response with on call services, utilizing safety plan, lethality assessment, and medication adjustments as needed.   ACT staff will provide [employer] Services to Ralphfor emergency and necessary personal needs on an as needed basis and when appropriate.    ACT staff will engageRalph with individual treatment and outreach via Reflective Listening, Motivational Interviewing, Amplified Reflection, Double Sided Reflection,or Person Centered Counseling at least once per month to five times per month.    ACT staff will provide  monthly Psychopharmcology contacts with an NP or MD, with no more than three months between MD contacts.   ACT staff may utilize [employer] funding for additional resources related to attaining Derek Walker's goal of attaining his own safe and affordable housing, as requested, and when appropriate during this treatment period.    ACT staff will provide resources such as bus passes, or transportation with cab services, or direct ACT transport to Chemical Dependency tx   ACT staff will provide general resources or additional contacts for landlords in the community, via lists, references, or assistance with transportation to view locations as requested, or as little as once per month, during this treatment period.    ACT staff will prompt Derek Walker to report if there has been any barrier to maintaining drug/alc abstinence, and if there are additional resources he could use, or counseling support if barriers are related to substance use and relapse, at least once a week during this treatment period.    ACT staff will encourage Derek Walker to maintain connection to his required services, including his substance use program, his probation officer, and any other required agency at least once per month during this treatment period.     DISCHARGE CRITERIA for this treatment setting: Derek Walker will be able to be treated by a lower level of care when he has the necessary natural supports in place in the community and he is engaged in medical and clinical treatment with consistency.    Clinician's name: Derek Walker, LMSW    Psychiatrist's Name: Dr. Maxie Betterobert Weisman, DO    Supervisor's Name: [facility], [facility], ACT Team Leader    Patient/Family Statement  PATIENT/FAMILY STATEMENT:  Obtain patient and family input into the treatment plan, including areas of agreement / disagreement.  Obtain patient's signature - if not possible, briefly describe the reason.     Patient Comments: none      I HAVE PARTICIPATED IN THE DEVELOPMENT OF  THIS TREATMENT PLAN AND I AGREE WITH ITS CONTENTS:       Patient Signature: __________obtained hard copy signature__________________    Date: ________7/12/2018______________________

## 2017-04-16 ENCOUNTER — Ambulatory Visit
Admission: RE | Admit: 2017-04-16 | Discharge: 2017-04-16 | Disposition: A | Discharge status: Home / Self Care | Payer: Medicaid Other | Source: Ambulatory Visit | Attending: Psychiatry | Admitting: Psychiatry

## 2017-04-16 ENCOUNTER — Ambulatory Visit: Payer: Medicaid Other | Attending: Psychiatry

## 2017-04-16 DIAGNOSIS — R109 Unspecified abdominal pain: Secondary | ICD-10-CM | POA: Insufficient documentation

## 2017-04-16 DIAGNOSIS — F333 Major depressive disorder, recurrent, severe with psychotic symptoms: Secondary | ICD-10-CM | POA: Insufficient documentation

## 2017-04-16 DIAGNOSIS — F323 Major depressive disorder, single episode, severe with psychotic features: Secondary | ICD-10-CM | POA: Insufficient documentation

## 2017-04-16 MED ORDER — IOHEXOL 350 MG/ML (OMNIPAQUE) IV SOLN *I*
1.0000 mL | Freq: Once | INTRAVENOUS | Status: AC
Start: 2017-04-16 — End: 2017-04-16
  Administered 2017-04-16: 100 mL via INTRAVENOUS

## 2017-04-16 MED ORDER — STERILE WATER FOR IRRIGATION IR SOLN *I*
900.0000 mL | Freq: Once | Status: AC
Start: 2017-04-16 — End: 2017-04-16
  Administered 2017-04-16: 900 mL via ORAL

## 2017-04-16 NOTE — Progress Notes (Signed)
Behavioral Health Progress Note     LENGTH OF SESSION: 180 minutes    Contact Type:  Location: Off Site    Face to Face     Problem(s)/Goals Addressed from Treatment Plan:    Treatment Problem #1 03/27/2017, continued from 02/21/2016   Patient Identified Problem Alc/drugs and instability at pt's s/o's and mother's residences        Treatment Goal #1 03/27/2017, continued from 02/21/2016   Patient Identified Goal "I will get my own apartment asap".         Progress toward goal(s): No change - Izaan has been living between both his mother's residence as well as his s/o [name]'s apartment. Pt reported that his s/o [name] received a 30-day eviction notice on 04/15/2017 due to her son Briant Cedar breaking windows on the premises. Nealy also reported that his mother's home is in foreclosure and could be given a 30-day notice at any time. Pt reports these factors as motivation and states that he has already begun to look for an apartment. Pt reports that he has communicated healthy boundary lines to [name] stating, "Don't look at me for your bills, I have to start doing for me". Pt also stated the possibility of [name] serving prison time and pt's youngest daughter "[name]" becoming his responsibility is a key motivational factor in "making moves as soon as possible". Wtr offered to assist pt in budgeting/savings to ensure pt has security deposit and first month's rent available and pt refused stating, "I'm not even trying to touch any money on my card, not at all".     ..........................................................................................................................................    Treatment Problem #2 03/27/2017   Patient Identified Problem poor physical and mental health       Treatment Goal #2 03/27/2017   Patient Identified Goal "I will get my health back in order (attending medical and clinical appointments, nutrition/weight)".       Progress toward goal(s): Problem resolving. Comment: Pt attended  medical appointment today for imaging services due to pt's complaint of urinating blood. Pt stated that he had not slept well, was extremely tired, and appeared disheveled. Pt showed resilience by attending medical appointment today despite not feeling well. Pt also reported on this date that on 04/15/2017 his [place]. CD counselor Alesia Banda notified pt that he would be moving on to Phase III as he has continued to attend groups and his last four tox screens were negative.    Mental Status Exam:  APPEARANCE: Disheveled  ATTITUDE TOWARD INTERVIEWER: Cooperative  MOTOR ACTIVITY: WNL (within normal limits)  EYE CONTACT: Direct  SPEECH: Soft and Slurred  AFFECT: Irritable, Pleasant and Depressed  MOOD: Depressed and Irritable  THOUGHT PROCESS: Circumstantial, Goal directed and Tangential  THOUGHT CONTENT: Negative Rumination  PERCEPTION: No evidence of hallucinations  CURRENT SUICIDAL IDEATION: patient denies  CURRENT HOMICIDAL IDEATION: Patient denies  ORIENTATION: Alert and Oriented X 3.  CONCENTRATION: Fair  MEMORY:   Recent: intact   Remote: intact  COGNITIVE FUNCTION: Average intelligence  JUDGMENT: Impaired -  minimal  IMPULSE CONTROL: Fair  INSIGHT: Fair    Risk Assessment:  ASSESSMENT OF RISK FOR SUICIDAL BEHAVIOR  Changes in risk for suicide from baseline Formulation of Risk and/or previous intake, including newly identified risk, if any: none  Violence risk was assessed and No Change noted from baseline formulation of risk and/or previous assessment.    Session Content::   Wtr arrived at pt's mother's residence and called the home phone and pt's mother, [name], answered and  gave the phone to [name], but calls him by his middle name Maurine MinisterDennis or "D". Pt sounded like he was sleeping and wtr announced arrival, pt stated that he would get ready for appointment. Wtr called again as nearly 10 minutes had passed and pt was given the phone again and sounded just as drowsy. Wtr stated that she would come to the door and wait  for pt. [name] welcomed wtr inside as he waited for the bathroom to brush his teeth and clean his face. Pt's Uncle was sitting in an adjacent room smoking a cigarette and sitting on the couch. Once pt was in the bathroom, pt's s/o [name] arrived and wtr greeted her. Wtr and [name] waited for [name] to finish getting ready in the bathroom and came out to put on a fresh white shirt while talking to [name]. [name] stated that she was headed downtown to meet with her attorney for her upcoming court date per felony charges and pt informed [name] that he would be getting imaging done today for his medical concerns. [name] kissed [name] goodbye in the driveway and [name] left on-foot walking towards bus stop.     Pt attended medical appointment today for imaging services due to pt's complaint of urinating blood. Pt stated that he had not slept well, was extremely tired, and appeared disheveled. Pt showed resilience by attending medical appointment today despite not feeling well.     Pt also reported on this date that on 04/15/2017 his [address] CD counselor Alesia BandaLeanne notified pt that he would be moving on to Phase III as he has continued to attend groups and his last four tox screens were negative.    [name] has been living between both his mother's residence as well as his s/o [name]'s apartment. Pt reported that his s/o [name] received a 30-day eviction notice on 04/15/2017 due to her son Briant CedarSamaj breaking windows on the premises. [name] also reported that his mother's home is in foreclosure and could be given a 30-day notice at any time. Pt reports these factors as motivation and states that he has already begun to look for an apartment. Pt reports that he has communicated healthy boundary lines to [name] stating, "Don't look at me for your bills, I have to start doing for me". Pt also stated the possibility of [name] serving prison time and pt's youngest daughter "[name]" becoming his responsibility is a key motivational factor in  "making moves as soon as possible". Wtr offered to assist pt in budgeting/savings to ensure pt has security deposit and first month's rent available and pt refused stating, "I'm not even trying to touch any money on my card, not at all".  Wtr agreed to pt attempting to budget/save independently and offered that the ACT Team would assist if pt wished. Pt was appreciative.     Wtr and pt said goodbyes and wtr left.         Visit Diagnosis:    Major psychotic depression, recurrent- Primary ICD-10-CM: F33.3  ICD-9-CM: 296.34     Interventions:  Continued to develop therapeutic relationship  Motivational Interviewing  Reviewed and discussed Treatment Plan/Treatment Plan Review  Supportive Psychotherapy    Current Treatment Plan   Created/Updated On 03/27/2017   Next Treatment Plan Due 09/27/2017       Plan:  Psychotherapy continues as described in care plan; plan remains the same. and Other planned interventions or recommendations: Wtr will schedule follow-up MIPS appointment now that pt has completed imaging due to pt's report  of urinating blood.    NEXT APPT: 04/18/2017, as per ACT calendar    Alinda MoneyShannon Jermiah Soderman, LMSW

## 2017-04-18 ENCOUNTER — Ambulatory Visit: Payer: Medicaid Other

## 2017-04-23 ENCOUNTER — Ambulatory Visit: Payer: Medicaid Other

## 2017-04-23 DIAGNOSIS — F323 Major depressive disorder, single episode, severe with psychotic features: Secondary | ICD-10-CM

## 2017-04-23 NOTE — Progress Notes (Signed)
Behavioral Health Progress Note     LENGTH OF SESSION: 20 minutes    Contact Type:  Location: Off Site    Face to Face     Problem(s)/Goals Addressed from Treatment Plan:    Treatment Problem #1 03/27/2017, continued from 02/21/2016   Patient Identified Problem Alc/drugs and instability at pt's s/o's and mother's residences        Treatment Goal #1 03/27/2017, continued from 02/21/2016   Patient Identified Goal "I will get my own apartment asap".           Progress toward goal(s): no change    1 a. Measurable Objectives : Derek Walker will work with ACT Team 3x a month to form a monthly budget, check-in with ongoing management of finances throughout month, and to note any adjustments that may need to be made after reviewing the month's income and overall expenses                        Date established: 03/27/2017                        Target date: 09/27/2017                        Attained or Revised? new    1 b. Measurable Objectives : I will meet with ACT staff at least twice monthly to discuss and receive assistance in searching for safe and affordable housing, scheduling tours, making phone calls, completing applications, etc. As demonstrated by Derek Walker having a safe and affordable place to live in the next 6 months.                        Date established: 09/02/2016 and continued on from this date 03/27/2017                        Target date: 09/27/2017                        Attained or Revised? Revised    1 c. Measurable Objectives : Derek Walker will continue to utilize clinical appointments with the ACT Team to address communication and conflict with s/o, pt's mother, and other family  members to assist pt in establishing healthy boundary lines and communication styles                        Date established: 03/27/2017                        Target date: 09/27/2017                        Attained or Revised?  new      ..........................................................................................................................................    Treatment Problem #2 03/27/2017   Patient Identified Problem poor physical and mental health       Treatment Goal #2 03/27/2017   Patient Identified Goal "I will get my health back in order (attending medical and clinical appointments, nutrition/weight)".     The rationale for addressing this problem is that resolving it will (select all that apply):  Reduce symptoms of disorder, Reduce functional impairment associated with disorder, Decrease likelihood of hospitalization, Facilitate transfer skills learned in therapy to everday life, Is a key motivational factor for the patient's participation in  treatment, Reduce risk for suicide and Reduce risk for violence    Progress toward goal(s): No change. Comment: Pt has had brief interludes of sobriety and attending clinical appointments but has continued to struggle to attend medical appointments and this continued avoidance has become a point of excessive anxiety and stress for pt due to ongoing health concerns that are unaddressed. Pt has continued to move towards his goal of successfully completing outpatient CD treatment at [facility]. Pt is in Phase II of treatment at [facility] as of this date 04/18/2017.       2 a. Measurable Objectives : I will [name]  Date established: 09/02/16  Target date: 09/27/2017  Attained or Revised? Continued    2 b. Measurable Objectives : [name] will discuss  barriers to attending medical and clinical appointments and work with ACT Team to overcome these challenges to increase pt's compliance with treatment recommendation at least 1x a month.                        Date established: 03/27/2017                        Target date:  09/27/2017                        Attained or Revised? new    2 c. Measurable Objectives : [name] will continue to explore and discuss cognitive dissonance between continued use of drug/alc and maintaining sobriety and the effects of pt's alc/drug use on his physical and mental health.                        Date established: 03/27/2017                        Target date: 09/27/2017                        Attained or Revised? new        Mental Status Exam:  APPEARANCE: Casual, Disheveled  ATTITUDE TOWARD INTERVIEWER: Cooperative  MOTOR ACTIVITY: WNL (within normal limits)  EYE CONTACT: Direct  SPEECH: Soft and Slowed  AFFECT: Pleasant  MOOD: Depressed and Irritable  THOUGHT PROCESS: Circumstantial and Goal directed  THOUGHT CONTENT: Negative Rumination  PERCEPTION: No evidence of hallucinations  CURRENT SUICIDAL IDEATION: patient denies  CURRENT HOMICIDAL IDEATION: Patient denies  ORIENTATION: Alert and Oriented X 3.  CONCENTRATION: WNL  MEMORY:   Recent: intact   Remote: intact  COGNITIVE FUNCTION: Average intelligence  JUDGMENT: Impaired -  minimal  IMPULSE CONTROL: Fair  INSIGHT: Fair    Risk Assessment:  ASSESSMENT OF RISK FOR SUICIDAL BEHAVIOR  Changes in risk for suicide from baseline Formulation of Risk and/or previous intake, including newly identified risk, if any: none  Violence risk was assessed and No Change noted from baseline formulation of risk and/or previous assessment.    Session Content::  Wtr and pt meet in wtr's vehicle. Pt shares continuous family conflict at both his mother's residence at his s/o [name]'s residence. Wtr provides validation and support. When wtr pulls pt to problem solve pt often returns to stating the problem rather than seeking a solution. Pt is reluctant to engage in coping skills or to working on what is in his control, such as  budgeting, saving money, opening a bank account, searching for his own apartment, boundary lines, building communication skills, and seeking higher  level of CD treatment due to pt's continuous drinking. Wtr and pt said goodbyes and wtr left.     Visit Diagnosis:    Major psychotic depression, recurrent- Primary ICD-10-CM: F33.3  ICD-9-CM: 296.34       Interventions:  Continued to develop therapeutic relationship  Identified adaptive/maladaptive family patterns  Motivational Interviewing  Provided therapeutic support during discussion of distressing/traumatic events and/or symptoms  Reviewed and discussed Treatment Plan/Treatment Plan Review    Current Treatment Plan   Created/Updated On 03/27/2017   Next Treatment Plan Due 09/27/2017       Plan:  Psychotherapy continues as described in care plan; plan remains the same.    NEXT APPT: 04/23/2017, as per ACT calendar      [name], LMSW

## 2017-04-24 NOTE — Progress Notes (Signed)
Behavioral Health Progress Note     LENGTH OF SESSION: 20 minutes    Contact Type:  Location: Off Site    Face to Face     Problem(s)/Goals Addressed from Treatment Plan:    Problem 1:   Treatment Problem #1 02/21/2016   Patient Identified Problem unstable housing       Goal for this problem:    Treatment Goal #1 02/21/2016   Patient Identified Goal "I will get my own apartment asap".         Progress towards this goal: Psychiatrically stable, mood pleasant    Mental Status Exam:  APPEARANCE: Casual  ATTITUDE TOWARD INTERVIEWER: Cooperative  MOTOR ACTIVITY: WNL (within normal limits)  EYE CONTACT: Direct  SPEECH: Normal rate and tone  AFFECT: Appropriate  MOOD: Normal  THOUGHT PROCESS: Normal  THOUGHT CONTENT: No unusual themes  PERCEPTION: Within normal limits  CURRENT SUICIDAL IDEATION: patient denies  CURRENT HOMICIDAL IDEATION: Patient denies  ORIENTATION: Alert and Oriented X 3.  CONCENTRATION: WNL  MEMORY:   Recent: intact   Remote: intact  COGNITIVE FUNCTION: Average intelligence  JUDGMENT: Intact  IMPULSE CONTROL: Intact  INSIGHT: Fair    Risk Assessment:  ASSESSMENT OF RISK FOR SUICIDAL BEHAVIOR  Changes in risk for suicide from baseline Formulation of Risk and/or previous intake, including newly identified risk, if any: none    Session Content::  Writer met with Decklin this day for a scheduled visit and engagement.  Client greeted Probation officer at the door after he was made aware of writer's arrival by family.  He expressed pleasure at Radiation protection practitioner as it was some time since our last contact.  He noted that he was doing well and "trying to keep it together".  He reported that he was now in Phase 3 of his substance abuse treatment program,  "I don't do that anymore and now you don't have to worry about snitching on me anymore" referencing his drinking and noting that it was several months since his last use.  He reports today's appointment was for his scheduled one on one with his counselor. "I don't know if I am  going, it's raining too hard".  Writer offered to drop him off but he noted, "I'm not going to go there and wait two hours".  "I'll just go call her and see if I can change it".  As Probation officer had to locate his bus pass and needed to return to the vehicle to further look, it was suggested that he call at the present time which he consented to do and Probation officer went to retrieve the bus pas/[employer].  Upon returning to the porch, client noted that he "didn't call" and decided to wait in the event the rain did stop.  Writer reiterated offer of a ride but he continued to decline.  Amilio signs [facility] paperwork and Probation officer believed client to smell of alcohol.  He is in receipt of his monthly bus pass and 2 [employer] gift cards. Additionally, Barett shares that his friend took him "to check out a job yesterday and reports a plan to return Monday for a "formal interview" which he reports being assured that he has the job and that it meets his criteria of being able to work 20 hours a week.  His goal is to eventually secure his own apartment.  No further ACT needs noted at this time.       Visit Diagnosis:      ICD-10-CM ICD-9-CM   1. Severe major depression  with psychotic features F32.3 296.24       Interventions:  Supportive Psychotherapy   Signed [facility]    Current Treatment Plan   Created/Updated On 02/21/2016   Next Treatment Plan Due 08/22/2016         Plan:  Psychotherapy continues as described in care plan; plan remains the same.    NEXT APPT: Per ACT schedule      Maryjane Hurter, LCSW

## 2017-04-28 ENCOUNTER — Ambulatory Visit: Payer: Medicaid Other

## 2017-05-01 ENCOUNTER — Ambulatory Visit: Payer: Medicaid Other

## 2017-05-02 ENCOUNTER — Ambulatory Visit: Payer: Medicaid Other

## 2017-05-02 DIAGNOSIS — F333 Major depressive disorder, recurrent, severe with psychotic symptoms: Secondary | ICD-10-CM
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
## 2017-05-02 NOTE — Progress Notes (Signed)
[facility] MISSED/CANCELLED APPOINTMENT     Name: Derek Walker  MRN: [id number]   DOB: [birth year]    Date of Scheduled Service: 04/28/17    [name] was a no show for today's appointment.  I have left the patient a message to contact me. and discussed today's appointment with my supervisor.    Additional Information:    Writer attempted to meet with [name] at two different locations but was unable to reach him. Updated team

## 2017-05-06 ENCOUNTER — Ambulatory Visit: Payer: Medicaid Other | Admitting: Psychiatry

## 2017-05-06 ENCOUNTER — Ambulatory Visit: Payer: Medicaid Other

## 2017-05-06 ENCOUNTER — Other Ambulatory Visit
Admission: RE | Admit: 2017-05-06 | Discharge: 2017-05-06 | Disposition: A | Discharge status: Home / Self Care | Payer: Medicaid Other | Source: Ambulatory Visit | Attending: Infectious Diseases | Admitting: Infectious Diseases

## 2017-05-06 DIAGNOSIS — E559 Vitamin D deficiency, unspecified: Secondary | ICD-10-CM

## 2017-05-06 DIAGNOSIS — Z13220 Encounter for screening for lipoid disorders: Secondary | ICD-10-CM

## 2017-05-06 DIAGNOSIS — F333 Major depressive disorder, recurrent, severe with psychotic symptoms: Secondary | ICD-10-CM

## 2017-05-06 DIAGNOSIS — Z79899 Other long term (current) drug therapy: Secondary | ICD-10-CM | POA: Insufficient documentation

## 2017-05-06 DIAGNOSIS — Z131 Encounter for screening for diabetes mellitus: Secondary | ICD-10-CM

## 2017-05-06 DIAGNOSIS — B20 Human immunodeficiency virus [HIV] disease: Secondary | ICD-10-CM

## 2017-05-06 LAB — URINALYSIS REFLEX TO CULTURE
Blood,UA: NEGATIVE
Ketones, UA: NEGATIVE
Leuk Esterase,UA: NEGATIVE
Nitrite,UA: NEGATIVE
Protein,UA: 30 mg/dL — AB
Specific Gravity,UA: 1.011 (ref 1.002–1.030)
pH,UA: 6 (ref 5.0–8.0)

## 2017-05-06 LAB — CBC AND DIFFERENTIAL
Baso # K/uL: 0 10*3/uL (ref 0.0–0.1)
Basophil %: 0.3 %
Eos # K/uL: 0.1 10*3/uL (ref 0.0–0.5)
Eosinophil %: 1.3 %
Hematocrit: 43 % (ref 40–51)
Hemoglobin: 14.7 g/dL (ref 13.7–17.5)
IMM Granulocytes #: 0 10*3/uL (ref 0.0–0.1)
IMM Granulocytes: 0.3 %
Lymph # K/uL: 1.1 10*3/uL — ABNORMAL LOW (ref 1.3–3.6)
Lymphocyte %: 27.2 %
MCH: 34 pg/cell — ABNORMAL HIGH (ref 26–32)
MCHC: 34 g/dL (ref 32–37)
MCV: 99 fL — ABNORMAL HIGH (ref 79–92)
Mono # K/uL: 0.3 10*3/uL (ref 0.3–0.8)
Monocyte %: 7.6 %
Neut # K/uL: 2.5 10*3/uL (ref 1.8–5.4)
Nucl RBC # K/uL: 0 10*3/uL (ref 0.0–0.0)
Nucl RBC %: 0 /100 WBC (ref 0.0–0.2)
Platelets: 85 10*3/uL — ABNORMAL LOW (ref 150–330)
RBC: 4.4 MIL/uL — ABNORMAL LOW (ref 4.6–6.1)
RDW: 13 % (ref 11.6–14.4)
Seg Neut %: 63.3 %
WBC: 3.9 10*3/uL — ABNORMAL LOW (ref 4.2–9.1)

## 2017-05-06 LAB — COMPREHENSIVE METABOLIC PANEL
ALT: 254 U/L — ABNORMAL HIGH (ref 0–50)
AST: 515 U/L — ABNORMAL HIGH (ref 0–50)
Albumin: 4.8 g/dL (ref 3.5–5.2)
Alk Phos: 110 U/L (ref 40–130)
Anion Gap: 18 — ABNORMAL HIGH (ref 7–16)
Bilirubin,Total: 0.9 mg/dL (ref 0.0–1.2)
CO2: 26 mmol/L (ref 20–28)
Calcium: 9.3 mg/dL (ref 9.0–10.3)
Chloride: 100 mmol/L (ref 96–108)
Creatinine: 0.71 mg/dL (ref 0.67–1.17)
GFR,Black: 134 *
GFR,Caucasian: 116 *
Glucose: 80 mg/dL (ref 60–99)
Lab: 6 mg/dL (ref 6–20)
Potassium: 3.9 mmol/L (ref 3.3–5.1)
Sodium: 144 mmol/L (ref 133–145)
Total Protein: 7.9 g/dL — ABNORMAL HIGH (ref 6.3–7.7)

## 2017-05-06 LAB — LIPID PANEL
Chol/HDL Ratio: 1.6
Cholesterol: 242 mg/dL — AB
HDL: 153 mg/dL
LDL Calculated: 78 mg/dL
Non HDL Cholesterol: 89 mg/dL
Triglycerides: 57 mg/dL

## 2017-05-06 LAB — URINE MICROSCOPIC (IQ200)
RBC,UA: <1 /hpf (ref 0–2)
WBC,UA: 4 /hpf (ref 0–5)

## 2017-05-06 MED ORDER — MIRTAZAPINE 7.5 MG PO TABS *A*
7.5000 mg | ORAL_TABLET | Freq: Every evening | ORAL | 1 refills | Status: DC
Start: 2017-05-06 — End: 2017-06-24

## 2017-05-06 NOTE — Progress Notes (Signed)
Behavioral Health Psychopharmacology Follow-up     Length of Session: 20 minutes.    Diagnosis Addressed    ICD-10-CM ICD-9-CM   1. Major psychotic depression, recurrent F33.3 296.34       Chief Complaint: Patient states "I've had some ups and downs".    There were no vitals taken for this visit.  Wt Readings from Last 3 Encounters:   04/16/17 74.4 kg (164 lb)   02/06/17 74.7 kg (164 lb 11.2 oz)   02/05/17 74.9 kg (165 lb 3.2 oz)       Recent History and Response to Medications  Mr. Dilger is a 41 year old African-American male with a history of PTSD, depression, anxiety, nicotine dependence and HIV who is seen today for psychopharmacology follow-up.  Most recent history is as follows:    02/05/17:  Patient was seen for psycopharmacology evaluation by Rosaland Lao, NP.  She wrote that patient has been responding and tolerating his current psychiatric medication regimen without untoward side effects. His appetite has improved as well as sleeping well. He has been abstaining from any illicit drugs/alcohol and he will be graduating from the program in a couple of months. He did not endorse any signs/syptoms of psychosis including paranoia or a/hallucination. He is not endorsing and thoughts of self harm or SI/HI/VI. No safety or lethality concerns at this time. Will discontinue prazosin as the patient hasn't been taking the medication and he hasn't had frequent nightmares to warrant him to be on the medication. However, if the nightmares worsen, will consider restarting the medication. has bee responding and tolerating his current psychiatric medication regimen without untoward side effects. His appetite has improved as well as sleeping well. He has been abstaining from any illicit drugs/alcohol and he will be graduating from the program in a couple of months. He did not endorse any signs/syptoms of psychosis including paranoia or a/hallucination. He is not endorsing and thoughts of self harm or SI/HI/VI. No  safety or lethality concerns at this time. Will discontinue prazosin as the patient hasn't been taking the medication and he hasn't had frequent nightmares to warrant him to be on the medication. However, if the nightmares worsen, will consider restarting the medication.  Psychopharmacology treatment plans at that time were as follows:  1. Discontinue prazosin 45m   2. Continue Olanzapine 590mPO QAM and 1052mO QHS for psychosis  3. Continue Mirtazapine 79m47m QHS for depression/anxiety  4. Continue Gabapentin 200mg25mTID for anxiety    02/05/17:  [facility] by Dr. EnaamHarold Barbanh c/o hematuria.  CT of pelvis and abdomen were ordered along with urine testing.    02/21/17:  Patient was seen by ACT team therapist ShannCharlynn Courtwrote Pt is currently fulfilling objective 1a. "[facility]" for Treatment Problem #1/Patient Identified goal. Pt notified wtr [facility] Pt stated that he would like to continue residing at his "daughter's mother's" ([name]) house at this time as the environment has become milder in stressors recently as [name]'s son, SajanAllena Earingot permitted by [name] to be home unless she is at the residence due to suspicion of reoccurring theft from household members including RalphPhat6/12/18:  I wasnt able to locate patient for psychopharmacology evaluation.    03/21/17:  Patient was seen by MonicRosaland Laowho wrote RalphMarisa 41  year old african [place] male with a history of PTSD, depression, anxiety and nicotine dependence who was inebriated today during the visit. Patient is voicing increase in stress level due to his current unstable housing, his daughter's mother is likely facing some jail/prison time and he does not feel as though he will be able to care for her on his own. His mood was more depressed than before,  and he hasn't been taking his medications, but self medicating by drinking alcohol. He denied any thoughts of self harm or SI/Hi/VI. No evidence of psychosis observed. Patient was encouraged to hydrate and to stay at home as he was under the influence of alcohol with slurred speech, unsteady gait with emotional/labile mood.  The plan was to continue the current medications:   Olanzapine 2m PO QAM and 174mPO QHS   Mirtazapine 1533mO QHS for depression/anxiety   Gabapentin 100m50m TID for anxiety   Naltrexone 50mg14mQAM for alcohol abuse    04/09/17:  Patient seen by team nurse KarriTollie Eth who described patient as anxious but at his baseline.    04/24/17:  Patient seen by JacquMarykay Lexwrote He noted that he was doing well and "trying to keep it together".  He reported that he was now in Phase 3 of his substance abuse treatment program,  "I don't do that anymore and now you don't have to worry about snitching on me anymore" referencing his drinking and noting that it was several months since his last use    05/06/17:  Today I saw patient for psychopharmacology evaluation at his home.  He answered the door promptly, and was cooperative and in good behavioral control as we sat together on his front porch.  He began by telling me that he's been experiencing "ups and down" lately - specifically that his mother was diagnosed with a recurrence of cancer last Thursday.  Since that time, he states that his depression has increased including sad mood, anergia, decreased concentration, hypersomnia and decreased appetite.  He stated that he had noting to eat at all yesterday.  He also reported ruminating at night about his mother and also custody issues with his daughter.  He denied auditory hallucinations but reported feeling "paranoid".  When I asked him what he meant, he told me that he's reluctant to leave the house because "I don't like the streets - - I've seen a lot".  He clearly denied having any  thoughts, plans or intentions of harming himself or anybody else, stating "I'm on borrowed time, I want to live", referring to his HIV diagnosis.  We discussed his medications and he reported stopping his gabapentin two weeks ago because "it made my hands tingle".  He also reported stopping his naltrexone last Thursday, adding "when I heard from my mom, I picked up (i.e. Started drinking) again".  We discussed how ETOH is a depressant, and that it will make his depression worse.  He didn't attempt to argue the point, stating "Yea . . . I know'.  We discussed his physical health, and he had no concerns other than his painful left shin.  A small healing dime size wound was apparent, and I emphasized the importance of being seen in [facility], possibly for an x-ray.  We also discussed management options for depression including stopping his ETOH use.  After reviewing options including increasing his mirtazepine, he told me "I think we need to increase it".  We discussed how he may experience some  sedation, and he wanted to "give it a try anyway".        Current use of alcohol or drugs: As above.  He stated that he's been drinking "minimally, like one or two beers a day".    ENERGY: Poor  SLEEP: Hypersomnia.    APPETITE: Poor  WEIGHT: Not assessed at this time  SEXUAL FUNCTION: not asked  ENJOYMENT/INTEREST: Poor "all I do is stay home and watch TV".    Review of Systems:  Pertinent items are noted in HPI.    Current Medications  Current Outpatient Prescriptions   Medication Sig    GABAPENTIN 100 MG capsule TAKE 1 CAPSULE (100MG TOTAL) BY MOUTH THREE TIMES A DAY.    efavirenz-emtrictabine-tenofovir (ATRIPLA) 600-200-300 MG per tablet Take 1 tablet by mouth nightly on an empty stomach    ciprofloxacin (CIPRO) 500 MG tablet Take 1 tablet (500 mg total) by mouth 2 times daily    mirtazapine (REMERON) 15 MG tablet Take 1 tablet (15 mg total) by mouth nightly    OLANZapine (ZYPREXA) 15 MG tablet Take 1 tablet (15 mg total) by  mouth nightly    OLANZapine (ZYPREXA) 5 MG tablet Take 1 tablet (5 mg total) by mouth every morning    naltrexone (DEPADE) 50 MG tablet Take 1 tablet (50 mg total) by mouth daily     No current facility-administered medications for this visit.        Side Effects  None    Mental Status  APPEARANCE: Appears stated age, Casual  ATTITUDE TOWARD INTERVIEWER: Cooperative  MOTOR ACTIVITY: No dyskinetic movements on AIMS.  Some rigidity without cogwheeling or tremor noted on modified Simpson-Angus.  No akathesia.  EYE CONTACT: Direct  SPEECH: Normal rate and tone  AFFECT: Decreased Range, sad  MOOD: Sad  THOUGHT PROCESS: Normal  THOUGHT CONTENT: No unusual themes  PERCEPTION: Within normal limits and No evidence of hallucinations  ORIENTATION: Alert and Oriented X 3.  CONCENTRATION: Fair  MEMORY:   Recent: intact   Remote: intact  COGNITIVE FUNCTION: Average intelligence  JUDGMENT: Intact  IMPULSE CONTROL: Poor around substance use.  INSIGHT: Fair    Risk Assessment  Self Injury: Patient Denies  Suicidal Ideation: Patient Denies  Homicidal Ideation: Patient Denies  Aggressive Behavior: Patient Denies    If any of the answers above are Yes, is there access to lethal means?   N/A    [place] Scale administered? N/A    Results  All labs in the last 3 months:    Hospital Outpatient Visit on 05/06/2017   Component Date Value Ref Range Status    WBC 05/06/2017 3.9* 4.2 - 9.1 THOU/uL Final    RBC 05/06/2017 4.4* 4.6 - 6.1 MIL/uL Final    Hemoglobin 05/06/2017 14.7  13.7 - 17.5 g/dL Final    Hematocrit 05/06/2017 43  40 - 51 % Final    MCV 05/06/2017 99* 79 - 92 fL Final    MCH 05/06/2017 34* 26 - 32 pg/cell Final    MCHC 05/06/2017 34  32 - 37 g/dL Final    RDW 05/06/2017 13.0  11.6 - 14.4 % Final    Platelets 05/06/2017 85* 150 - 330 THOU/uL Final    Seg Neut % 05/06/2017 63.3  % Final    Lymphocyte % 05/06/2017 27.2  % Final    Monocyte % 05/06/2017 7.6  % Final    Eosinophil % 05/06/2017 1.3  % Final     Basophil % 05/06/2017 0.3  % Final  Neut # K/uL 05/06/2017 2.5  1.8 - 5.4 THOU/uL Final    Lymph # K/uL 05/06/2017 1.1* 1.3 - 3.6 THOU/uL Final    Mono # K/uL 05/06/2017 0.3  0.3 - 0.8 THOU/uL Final    Eos # K/uL 05/06/2017 0.1  0.0 - 0.5 THOU/uL Final    Baso # K/uL 05/06/2017 0.0  0.0 - 0.1 THOU/uL Final    Nucl RBC % 05/06/2017 0.0  0.0 - 0.2 /100 WBC Final    Nucl RBC # K/uL 05/06/2017 0.0  0.0 - 0.0 THOU/uL Final    IMM Granulocytes # 05/06/2017 0.0  0.0 - 0.1 THOU/uL Final    IMM Granulocytes 05/06/2017 0.3  % Final    Sodium 05/06/2017 144  133 - 145 mmol/L Final    Potassium 05/06/2017 3.9  3.3 - 5.1 mmol/L Final    Chloride 05/06/2017 100  96 - 108 mmol/L Final    CO2 05/06/2017 26  20 - 28 mmol/L Final    Anion Gap 05/06/2017 18* 7 - 16 Final    UN 05/06/2017 6  6 - 20 mg/dL Final    Creatinine 05/06/2017 0.71  0.67 - 1.17 mg/dL Final    GFR,Caucasian 05/06/2017 116  * Final    GFR,Black 05/06/2017 134  * Final    *UNITS=mL/min/1.73 square meters    Glucose 05/06/2017 80  60 - 99 mg/dL Final    Comment: Reference Ranges apply only to FASTING samples.    ADA Guidelines Blood Sugar Levels for Diagnosing Diabetes & Pre-diabetes  Normal: < 100 mg/dL  Impaired Fasting Glucose (IFG): 100-125 mg/dL  Diabetes:  > 126 mg/dL on two different occasions      Calcium 05/06/2017 9.3  9.0 - 10.3 mg/dL Final    Total Protein 05/06/2017 7.9* 6.3 - 7.7 g/dL Final    Albumin 05/06/2017 4.8  3.5 - 5.2 g/dL Final    Bilirubin,Total 05/06/2017 0.9  0.0 - 1.2 mg/dL Final    AST 05/06/2017 515* 0 - 50 U/L Final    ALT 05/06/2017 254* 0 - 50 U/L Final    Alk Phos 05/06/2017 110  40 - 130 U/L Final    Cholesterol 05/06/2017 242* mg/dL Final    Comment: REFERENCE RANGE:  < 200 Desirable                  200-239 Borderline High                    > 240 High      Triglycerides 05/06/2017 57  mg/dL Final    Comment: REFERENCE RANGE:  < 150 Normal                  150-199 Borderline High                   200-499 High                    > 500 Very High      HDL 05/06/2017 153  mg/dL Final    Comment: REFERENCE RANGE:  < 40 Low                    > 60 High      LDL Calculated 05/06/2017 78  mg/dL Final    Comment: REFERENCE RANGE:  < 100 Optimal                  100-129 Near or above  optimal                  130-159 Borderline High                  160-189 High                    > 189 Very High      Non HDL Cholesterol 05/06/2017 89  mg/dL Final    Target is 61m/dl above(or over) LDL goal    Chol/HDL Ratio 05/06/2017 1.6   Final   Office Visit on 02/05/2017   Component Date Value Ref Range Status    Aerobic Culture 02/05/2017 .   Final    Color, UA 02/05/2017 Lt Yellow  Yellow Final    Appearance,UR 02/05/2017 1+ Cloudy* Clear Final    Specific Gravity,UA 02/05/2017 1.005  1.002 - 1.030 Final    Leuk Esterase,UA 02/05/2017 NEG  NEGATIVE Final    Nitrite,UA 02/05/2017 NEG  NEGATIVE Final    pH,UA 02/05/2017 6.0  5.0 - 8.0 Final    Protein,UA 02/05/2017 NEG  NEGATIVE mg/dL Final    Glucose,UA 02/05/2017 NORM  mg/dL Final    NORMAL    Ketones, UA 02/05/2017 NEG  NEGATIVE Final    Blood,UA 02/05/2017 NEG  NEGATIVE Final    RBC,UA 02/05/2017 1  0 - 2 /hpf Final    WBC,UA 02/05/2017 1  0 - 5 /hpf Final    Bacteria,UA 02/05/2017 1+   Final    Squam Epithel,UA 02/05/2017 2+* 0-1+ Final       Current Treatment Plan   Created/Updated On 02/21/2016   Next Treatment Plan Due 08/22/2016       Assessment:  Mr. LAddois a 41year old African-American man with a history of PTSD, depression, anxiety, nicotine dependence and HIV who is seen today for psychopharmacology follow-up.  In the context of familial illness and custody issues, he reports an increase in his depression and resumption of drinking without clear signs of psychosis or any safety issues.  Although his alcohol use is likely contributing to his depressed mood, it probably doesn't explain it entirely.  I have encouraged patient to resume naltrexone  which he agreed to do.  I also think it is reasonable to increase his mirtazepine from 1262mpo HS to 22.62m53mo HS, which Mr. LewEgertons in favor of.  Review of completed labs from today show increased AST and ALT, low WBC and platelets, and increased cholesterol.  He is in need of medical f/u for these lab abnormalities.    Plan and Rationale:  1.  [facility] to be [facility] in [facility] for f/u  2. Increase mirtazepine by 7.62mg83mrom 15 to 22.62mg 462mHS  3. Continue to monitor patient for medication safety, tolerability, and effectiveness.  4. Encourage abstinence from ETOH and adherence to naltrexone.  5. Provide family support as needed.    Greater than 50% of visit time spent counseling/coordinating care. Time spent counseling:  15 minutes

## 2017-05-07 LAB — LYMPHOCYTE SUBSET (T CELLS ONLY)
CD4#: 166 cells/uL — ABNORMAL LOW (ref 496–2186)
CD4%: 24 % — ABNORMAL LOW (ref 32–71)
CD4/CD8: 0.5 — ABNORMAL LOW (ref 0.7–3.0)
CD8#: 339 cells/uL (ref 177–1137)
T LYM #(CD3): 538 cells/uL — ABNORMAL LOW (ref 754–2810)
T LYM %(CD3): 78 % (ref 54–87)
T Suppress %(CD8): 49 % — ABNORMAL HIGH (ref 10–38)

## 2017-05-07 LAB — HEMOGLOBIN A1C: Hemoglobin A1C: 5 % (ref 4.0–6.0)

## 2017-05-07 LAB — SYPHILIS SCREEN
Syphilis Screen: NEGATIVE
Syphilis Status: NONREACTIVE

## 2017-05-07 LAB — HIV VIRAL LOAD
HIV-1 PCR log: NEGATIVE copies/mL
HIV-1 PCR: NEGATIVE copies/mL

## 2017-05-07 NOTE — Progress Notes (Signed)
Behavioral Health Progress Note     LENGTH OF SESSION: 60 minutes    Contact Type:  Location: Off Site    Face to Face     Problem(s)/Goals Addressed from Treatment Plan:    Treatment Problem #1 03/27/2017, continued from 02/21/2016   Patient Identified Problem Alc/drugs and instability at pt's s/o's and mother's residences        Treatment Goal #1 03/27/2017, continued from 02/21/2016   Patient Identified Goal "I will get my own apartment asap".         Progress toward goal(s): N/A - New goal    1 a. Measurable Objectives : Giomar will work with ACT Team 3x a month to form a monthly budget, check-in with ongoing management of finances throughout month, and to note any adjustments that may need to be made after reviewing the month's income and overall expenses                        Date established: 03/27/2017                        Target date: 09/27/2017                        Attained or Revised? new    1 b. Measurable Objectives : I will meet with ACT staff at least twice monthly to discuss and receive assistance in searching for safe and affordable housing, scheduling tours, making phone calls, completing applications, etc. As demonstrated by [name] having a safe and affordable place to live in the next 6 months.                        Date established: 09/02/2016 and continued on from this date 03/27/2017                        Target date: 09/27/2017                        Attained or Revised? Revised    1 c. Measurable Objectives : Gualberto will continue to utilize clinical appointments with the ACT Team to address communication and conflict with s/o, pt's mother, and other family  members to assist pt in establishing healthy boundary lines and communication styles                        Date established: 03/27/2017                        Target date: 09/27/2017                        Attained or Revised? New      Progress towards this goal: No change. Comment: Pt continues to live between his mother's and s/o's  residences despite ongoing conflict and safety concerns. Pt refused assistance to look for alternative housing or budgeting at this time stating that he will do so independently.      ..........................................................................................................................................    Treatment Problem #2 03/27/2017   Patient Identified Problem poor physical and mental health       Treatment Goal #2 03/27/2017   Patient Identified Goal "I will get my health back in order (attending medical and clinical appointments, nutrition/weight)".  Progress toward goal(s): No change. Comment: Pt has had brief interludes of sobriety and attending clinical appointments but has continued to struggle to attend medical appointments and this continued avoidance has become a point of excessive anxiety and stress for pt due to ongoing health concerns that are unaddressed. Pt has continued to move towards his goal of successfully completing outpatient CD treatment at [facility]. Pt is in Phase II of treatment at [facility] as of this date 03/27/2017.       2 a. Measurable Objectives : [name]  Date established: 09/02/16  Target date: 09/27/2017  Attained or Revised? Continued    2 b. Measurable Objectives : Harlow will discuss  barriers to attending medical and clinical appointments and work with ACT Team to overcome these challenges to increase pt's compliance with treatment recommendation at least 1x a month.                        Date established: 03/27/2017                        Target date: 09/27/2017                        Attained or Revised? new    2 c. Measurable Objectives : Clevon will continue to explore and discuss cognitive dissonance between continued use of drug/alc and maintaining sobriety and the effects of pt's  alc/drug use on his physical and mental health.                        Date established: 03/27/2017                        Target date: 09/27/2017                        Attained or Revised? new      Progress towards this goal: No change. Comment: Pt is reluctant to be honest about his drinking use with his probation officer despite awareness of the expectation. Pt states that he is doing well at [facility] for [facility] CD [facility]. Wtr commends pt and encourages pt to practice honesty with probation officer as this could benefit his CD recovery.    Mental Status Exam:  APPEARANCE: Well-groomed, Casual  ATTITUDE TOWARD INTERVIEWER: Cooperative  MOTOR ACTIVITY: WNL (within normal limits)  EYE CONTACT: Indirect  SPEECH: Normal rate and tone  AFFECT: Anxious and Pleasant  MOOD: Anxious and Depressed  THOUGHT PROCESS: Circumstantial and Goal directed  THOUGHT CONTENT: Negative Rumination  PERCEPTION: No evidence of hallucinations  CURRENT SUICIDAL IDEATION: patient denies  CURRENT HOMICIDAL IDEATION: Patient denies  ORIENTATION: Alert and Oriented X 3.  CONCENTRATION: WNL  MEMORY:   Recent: intact   Remote: intact  COGNITIVE FUNCTION: Average intelligence  JUDGMENT: Impaired -  mild  IMPULSE CONTROL: Fair  INSIGHT: Fair    Risk Assessment:  ASSESSMENT OF RISK FOR SUICIDAL BEHAVIOR  Changes in risk for suicide from baseline Formulation of Risk and/or previous intake, including newly identified risk, if any: none    Session Content::  Wtr transported pt to complete labs as well as attended probation with pt. Pt reported no alc/drug use to his PO, [employer]. Wtr reviewed with pt after appointment that  pt was expected to report any drug/alc use and that this would most likely not result in a violation of probation, but pt could receive assistance in overcoming CD. Pt stated that he had not been drinking often and wtr reviewed with pt that total abstinence was a requirement of probation so even if pt was  recreationally drinking, it was expected to be reported at probation. Pt received reluctantly. Wtr explores pt's cognitive dissonance between continued drinking/lifestyle and attempting CD recovery. Pt is engaged but remains reluctant. Wtr transports pt back to his mother's residence. Wtr and pt say goodbyes and wtr left.     Visit Diagnosis:    Major psychotic depression, recurrent- Primary ICD-10-CM: F33.3  ICD-9-CM: 296.34       Interventions:  Continued to develop therapeutic relationship  Supportive Psychotherapy  Wtr transported pt to complete labs as well as attended probation with pt.     Current Treatment Plan   Created/Updated On 03/27/2017   Next Treatment Plan Due 09/27/2017       Plan:  Psychotherapy continues as described in care plan; plan remains the same. and Other planned interventions or recommendations: Pt has avoided appointments and treatment for pt's medical concerns and is more willing to work with the ACT Team to address pt's health when paired with incentives such as an [employer] gift card as pt has reported not having enough food.     NEXT APPT: 05/09/2017, as per ACT calendar      [name], LMSW

## 2017-05-08 ENCOUNTER — Ambulatory Visit: Payer: Medicaid Other

## 2017-05-08 DIAGNOSIS — F333 Major depressive disorder, recurrent, severe with psychotic symptoms: Secondary | ICD-10-CM

## 2017-05-09 ENCOUNTER — Ambulatory Visit: Payer: Medicaid Other

## 2017-05-09 ENCOUNTER — Ambulatory Visit: Payer: Medicaid Other | Admitting: Psychiatry

## 2017-05-09 LAB — VITAMIN D
25-OH VIT D2: <4 ng/mL
25-OH VIT D3: 14 ng/mL
25-OH Vit Total: 14 ng/mL — ABNORMAL LOW (ref 30–60)

## 2017-05-09 NOTE — Progress Notes (Signed)
Behavioral Health Progress Note     LENGTH OF SESSION: 15 minutes    Contact Type:  Location: Off Site    Face to Face     Problem(s)/Goals Addressed from Treatment Plan:    Problem 1:   Treatment Problem #1 02/21/2016   Patient Identified Problem unstable housing       Goal for this problem:    Treatment Goal #1 02/21/2016   Patient Identified Goal "I will get my own apartment asap".         Progress towards this goal: No change. Comment: Travez had just awakened upon writer's arrival    Mental Status Exam:  APPEARANCE: Casual  ATTITUDE TOWARD INTERVIEWER: Cooperative  MOTOR ACTIVITY: WNL (within normal limits)  EYE CONTACT: Direct  SPEECH: Normal rate and tone  AFFECT: Appropriate  MOOD: Normal  THOUGHT PROCESS: Normal  THOUGHT CONTENT: No unusual themes  PERCEPTION: Within normal limits  CURRENT SUICIDAL IDEATION: patient denies  CURRENT HOMICIDAL IDEATION: Patient denies  ORIENTATION: Alert and Oriented X 3.  CONCENTRATION: WNL  MEMORY:   Recent: intact   Remote: intact  COGNITIVE FUNCTION: Average intelligence  JUDGMENT: Intact  IMPULSE CONTROL: Intact  INSIGHT: Fair    Risk Assessment:  ASSESSMENT OF RISK FOR SUICIDAL BEHAVIOR  Changes in risk for suicide from baseline Formulation of Risk and/or previous intake, including newly identified risk, if any: none    Session Content::  Writer arrived at client's mother address and was advised that he was not there and therefore writer arrived at his 47 address and was greeted by Sanjeev after a brief wait at the opened door.  Truong reported "I just got up" upon seeing writer at the door.  He briefly retreated in order to put on a top and met with Probation officer on the front porch.  He pointed out his daughter who was riding her bike.  He reported that he was doing well and reported no concerns.  He was aware of his medical appointment at [facility] on tomorrow and plans for [facility] to pick him up.  He has attended his one on one session with his counselor at [facility] the  week and reports that he is sober at this time.  Christophere sharesd upon inquiry that the job he had been looking in to at our last contact "didn't come through".  He is in receipt of his medications and is aware of the fact that Dr. [name] had increased the Remeron.  He notes that he is taking all of his meds as prescribed.  No further CT concerns noted.     Visit Diagnosis:      ICD-10-CM ICD-9-CM   1. Major psychotic depression, recurrent F33.3 296.34       Interventions:  Continued to develop therapeutic relationship   Medication delivery    Current Treatment Plan   Created/Updated On 02/21/2016   Next Treatment Plan Due 08/22/2016         Plan:  Psychotherapy continues as described in care plan; plan remains the same.    NEXT APPT: Per ACT schedule      Maryjane Hurter, LCSW

## 2017-05-11 DIAGNOSIS — F4310 Post-traumatic stress disorder, unspecified: Secondary | ICD-10-CM

## 2017-05-11 NOTE — Progress Notes (Signed)
[facility] ACT Team Clinical Management Note     Response from MIPs below, writer provided update to Primary Care Coordinator [facility] regarding same:    RE: Urgent Slot needed for this afternoon please   Received: 1 week ago     Dustin Flock, RN  [name], RN                I have no urgent/same day slots available today. I would refer him to urgent care or the ED. Sorry!     Dustin Flock, RN       [name], RN

## 2017-05-12 ENCOUNTER — Ambulatory Visit: Payer: Medicaid Other

## 2017-05-16 LAB — TB AG T-CELL STIMULATION: TB Ag T-Cell Stimulation: 0

## 2017-05-21 ENCOUNTER — Ambulatory Visit: Payer: Medicaid Other | Attending: Psychiatry

## 2017-05-21 ENCOUNTER — Other Ambulatory Visit: Payer: Self-pay | Admitting: Psychiatry

## 2017-05-21 DIAGNOSIS — F333 Major depressive disorder, recurrent, severe with psychotic symptoms: Secondary | ICD-10-CM | POA: Insufficient documentation

## 2017-05-21 DIAGNOSIS — F4310 Post-traumatic stress disorder, unspecified: Secondary | ICD-10-CM | POA: Insufficient documentation

## 2017-05-22 ENCOUNTER — Ambulatory Visit: Payer: Medicaid Other | Admitting: Psychiatry

## 2017-05-26 ENCOUNTER — Ambulatory Visit: Payer: Medicaid Other | Admitting: Psychiatry

## 2017-05-27 ENCOUNTER — Other Ambulatory Visit: Payer: Self-pay | Admitting: Psychiatry

## 2017-05-27 DIAGNOSIS — F323 Major depressive disorder, single episode, severe with psychotic features: Secondary | ICD-10-CM

## 2017-05-28 ENCOUNTER — Ambulatory Visit: Payer: Medicaid Other

## 2017-05-28 ENCOUNTER — Other Ambulatory Visit: Payer: Self-pay | Admitting: Gastroenterology

## 2017-05-30 ENCOUNTER — Ambulatory Visit: Payer: Medicaid Other | Admitting: Psychiatry

## 2017-05-30 ENCOUNTER — Encounter: Payer: Self-pay | Admitting: Psychiatry

## 2017-05-30 DIAGNOSIS — F333 Major depressive disorder, recurrent, severe with psychotic symptoms: Secondary | ICD-10-CM

## 2017-05-30 DIAGNOSIS — F4310 Post-traumatic stress disorder, unspecified: Secondary | ICD-10-CM

## 2017-05-30 MED ORDER — GABAPENTIN 100 MG PO CAPSULE *I*
100.0000 mg | ORAL_CAPSULE | Freq: Three times a day (TID) | ORAL | 2 refills | Status: DC
Start: 2017-05-30 — End: 2017-08-20
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
## 2017-06-04 ENCOUNTER — Ambulatory Visit: Payer: Medicaid Other

## 2017-06-04 ENCOUNTER — Ambulatory Visit: Payer: Medicaid Other | Admitting: Infectious Diseases

## 2017-06-04 NOTE — Progress Notes (Signed)
[facility] MISSED/CANCELLED APPOINTMENT     Name: Derek Walker  MRN: [id number]   DOB: 1976/07/01    Date of Scheduled Service: 05/28/17    [name] was a no show for today's appointment.  I have attempted a home visit and call to client with no success.    Additional Information:    Not applicable

## 2017-06-06 NOTE — Progress Notes (Signed)
Writer attempted to reach Derek Walker by phone.  Writer spoke with his mother at the house.  She gave a new cell:  [id number].  Writer was not able to reach him at that number either.  Writer texted a message, informing him Clinical research associate available to coordinate appointment with medical provider tomorrow and asking for return response.

## 2017-06-06 NOTE — Progress Notes (Signed)
[facility] MISSED/CANCELLED APPOINTMENT     Name: RICARDO SCHUBACH  MRN: [id number]   DOB: 1976-07-18    Date of Scheduled Service: 06/04/17  Mr. Petitjean was a no show for today's appointment.  I have attempted to reach him at known phone numbers. A man answered the phone and said he was not there.  There was no answer on the cell phone.  Writer updated team.      Additional Information:    Not applicable

## 2017-06-10 ENCOUNTER — Ambulatory Visit: Payer: Medicaid Other

## 2017-06-10 ENCOUNTER — Encounter: Payer: Self-pay | Admitting: Gastroenterology
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
## 2017-06-16 ENCOUNTER — Ambulatory Visit: Payer: Medicaid Other | Attending: Psychiatry | Admitting: Psychiatry

## 2017-06-16 ENCOUNTER — Ambulatory Visit: Payer: Self-pay

## 2017-06-16 DIAGNOSIS — F333 Major depressive disorder, recurrent, severe with psychotic symptoms: Secondary | ICD-10-CM | POA: Insufficient documentation

## 2017-06-16 DIAGNOSIS — F4310 Post-traumatic stress disorder, unspecified: Secondary | ICD-10-CM | POA: Insufficient documentation

## 2017-06-18 ENCOUNTER — Ambulatory Visit: Payer: Medicaid Other

## 2017-06-19 NOTE — Progress Notes (Signed)
Behavioral Health Psychopharmacology Follow-up     Length of Session: 15 minutes.    Diagnosis Addressed    ICD-10-CM ICD-9-CM   1. 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 PTSD (post-traumatic stress disorder) F43.10 309.81       Chief Complaint: Patient states "I need to get a new apartment"     There were no vitals taken for this visit.  Wt Readings from Last 3 Encounters:   04/16/17 74.4 kg (164 lb)   02/06/17 74.7 kg (164 lb 11.2 oz)   02/05/17 74.9 kg (165 lb 3.2 oz)       Recent History and Response to Medications  Patient was seen in his mother's home. Patient informed the writer that he has been taking his medications regularly and he feels his mood has been fair while struggling with having to care for his children in the weekend while his mother's children serves weekends in jail. He stated that he is sleeping well. He denied any recent illicit and or drug use. Patient states that he is currently looking for an apartment. States he is medication compliant. Denies any safety concerns.     Current use of alcohol or drugs: no    ENERGY: Good  SLEEP: Normal.    APPETITE: Good  WEIGHT: No Change  SEXUAL FUNCTION: not asked  ENJOYMENT/INTEREST: Good    Review of Systems:  Pertinent items are noted in HPI.    Current Medications  Current Outpatient Prescriptions   Medication Sig    gabapentin (GABAPENTIN) '100MG'$  capsule Take 1 capsule (100 mg total) by mouth 3 times daily    mirtazapine (REMERON) 15 MG tablet TAKE 1 TABLET ('15MG'$  TOTAL) BY MOUTH NIGHTLY.    OLANZapine (ZYPREXA) 15 MG tablet TAKE 1 TABLET ('15MG'$  TOTAL) BY MOUTH NIGHTLY.    OLANZapine (ZYPREXA) 5 MG tablet TAKE 1 TABLET ('5MG'$  TOTAL) BY MOUTH EVERY MORNING.    naltrexone (DEPADE) 50 MG tablet TAKE 1 TABLET ('50MG'$  TOTAL) BY MOUTH DAILY    mirtazapine (REMERON) 7.5 MG tablet Take 1 tablet (7.5 mg total) by mouth nightly    efavirenz-emtrictabine-tenofovir (ATRIPLA) 600-200-300 MG per tablet Take 1 tablet by mouth nightly on an empty  stomach    ciprofloxacin (CIPRO) 500 MG tablet Take 1 tablet (500 mg total) by mouth 2 times daily     No current facility-administered medications for this visit.        Side Effects  None    Mental Status  APPEARANCE: Appears stated age, Casual  ATTITUDE TOWARD INTERVIEWER: Cooperative  MOTOR ACTIVITY: WNL (within normal limits)  EYE CONTACT: Direct  SPEECH: Normal rate and tone  AFFECT: Full Range  MOOD: Euthymic  THOUGHT PROCESS: Normal  THOUGHT CONTENT: No unusual themes  PERCEPTION: Within normal limits  ORIENTATION: Alert and Oriented X 3.  CONCENTRATION: WNL  MEMORY:   Recent: intact   Remote: impaired remote memory  COGNITIVE FUNCTION: Average intelligence  JUDGMENT: Impaired -  minimal  IMPULSE CONTROL: Fair  INSIGHT: Fair    Risk Assessment  Self Injury: Patient Denies  Suicidal Ideation: Patient Denies  Homicidal Ideation: Patient Denies  Aggressive Behavior: Patient Denies   Changes in risk for suicide form baseline Formulation of Risk and/or previous intake, including newly identified risk, if any: none.          If any of the answers above are Yes, is there access to lethal means?   No    [place] Scale administered? N/A    Results  Lab Results   Component Value Date    CHOL 242 (!) 05/06/2017    HDL 153 05/06/2017    LDLC 78 05/06/2017    TRIG 57 05/06/2017    CHHDC 1.6 05/06/2017    GLU 80 05/06/2017    HA1C 5.0 05/06/2017    WBC 3.9 (L) 05/06/2017    ASEGR 2.5 05/06/2017       No results for input(s): TSH in the last 8760 hours.        Lab results: 05/06/17  1146   Sodium 144   Potassium 3.9   Chloride 100   CO2 26   UN 6   Creatinine 0.71   GFR,Caucasian 116   GFR,Black 134   Glucose 80   Calcium 9.3   Total Protein 7.9*   Albumin 4.8   ALT 254*   AST 515*   Alk Phos 110   Bilirubin,Total 0.9         Assessment:  Derek Walker is a 40 year old african [place] male with a history of PTSD, depression, anxiety and nicotine dependence who has bee responding and tolerating his current psychiatric medication  regimen without untoward side effects. He has been abstaining from any illicit drugs/alcohol and is working closely with case manager to find housing. He did not endorse any signs/syptoms of psychosis including paranoia or a/hallucination. He is not endorsing and thoughts of self harm or SI/HI/VI. No safety or lethality concerns at this time. Patient remains appropriate for an ACT level of care.      Plan and Rationale:  1. Continue with the current medication regimen of olanzapine 82m PO QAM and 119mPO QHS for psychosis, mirtazapine 1555mO QHS for depression/anxiety, gabapentin 200m24m TID for anxiety   2. Continue to provide motivational interviewing related to smoking cessation.  3. Continue to encourage abstinence from alcohol use.  4. Contact ACT emergency on call phone for urgent issues.       Greater than 50% of visit time spent counseling/coordinating care. Time spent counseling:  15 minutes

## 2017-06-24 ENCOUNTER — Ambulatory Visit: Payer: Medicaid Other

## 2017-06-24 ENCOUNTER — Other Ambulatory Visit: Payer: Self-pay | Admitting: Psychiatry

## 2017-06-24 NOTE — Progress Notes (Signed)
[facility] MISSED/CANCELLED APPOINTMENT     Name: Derek Walker  MRN: [id number]   DOB: July 28, 1976    Date of Scheduled Service: 06/24/17    [name] was a no show for today's appointment.  I have attempted to locate client at his mother's address and was informed by client's mother that he was not home but on [facility].  Additionally, Clinical research associate drove by [facility] and the young man on the porch advised that he was not home.    Additional Information:    Not applicable

## 2017-06-25 ENCOUNTER — Ambulatory Visit: Payer: Medicaid Other

## 2017-06-27 ENCOUNTER — Ambulatory Visit: Payer: Medicaid Other
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
## 2017-07-01 NOTE — Progress Notes (Signed)
[facility] ACT Team Clinical Management Note     Collateral Contact with pt's CD counselor at [facility].   Type of visit: Face-to-face  Duration: 60 minutes    Wtr met with pt's CD counselor at [facility] [place]. Wtr shared ACT Team's concerns for Hykeem per his increase in drinking, lack of medication adherence, and poor attendance to medical appointments. Leanne stated that Valery's last reported alcohol use was 04/30/2017. Wtr stated that Romon had at least drank alcohol to excess once in the last week. Leanne stated that she would speak with [name] regarding this discrepancy. Leanne stated that she would call wtr and let wtr know how the conversation went with [name] as well as to update ACT Team with [facility]s CD recommendation for [place]. Alternatives such as CD inpatient and halfway houses were discussed.    Danise Edge, LMSW

## 2017-07-03 ENCOUNTER — Ambulatory Visit: Payer: Medicaid Other

## 2017-07-04 ENCOUNTER — Other Ambulatory Visit: Payer: Self-pay | Admitting: Gastroenterology

## 2017-07-04 NOTE — Progress Notes (Signed)
Behavioral Health Progress Note     LENGTH OF SESSION: 60 minutes    Contact Type:  Location: Off Site    Face to Face     Problem(s)/Goals Addressed from Treatment Plan:  Treatment Problem #1 03/27/2017, continued from 02/21/2016   Patient Identified Problem Alc/drugs and instability at pt's s/o's and mother's residences       Treatment Goal #1 03/27/2017, continued from 02/21/2016   Patient Identified Goal "I will get my own apartment asap".      Progress toward goal(s): No change. Comment: Pt has accepted budgeting assistance on this date 07/03/17 and stated that on 07/17/17 he would like to give the ACT Team a security deposit of $400 to hold for him until he finds an apartment. Pt would have $300 remainder for monthly expenses.  Derek Walker reported consistently moving back and forth between his mother's and [name]'s (s/o) residences due to conflict. Pt reported motivation to "take care of me" and to find his own apartment. Pt stated that he was looking at apartments on a list that he received for affordable housing from [facility]       1 a. Measurable Objectives : Kuba will work with ACT Team 3x a month to form a monthly budget, check-in with ongoing management of finances throughout month, and to note any adjustments that may need to be made after reviewing the month's income and overall expenses  Date established: 03/27/2017  Target date: 09/27/2017  Attained or Revised? new    1 b. Measurable Objectives :I will meet with ACT staff at least twice monthly to discuss and receive assistance in searching for safe and affordable housing, scheduling tours, making phone calls, completing applications, etc. As demonstrated by [name] having a safe and affordable place to live in the next 6 months.  Date established:09/02/2016 and continued on from this date 03/27/2017  Target date:  09/27/2017  Attained or Revised? Revised    1 c. Measurable Objectives : Remus will continue to utilize clinical appointments with the ACT Team to address communication and conflict with s/o, pt's mother, and other family members to assist pt in establishing healthy boundary lines and communication styles  Date established: 03/27/2017  Target date: 09/27/2017  Attained or Revised? new  ..........................................................................................................................................    Treatment Problem #2 03/27/2017   Patient Identified Problem poor physical and mental health       Treatment Goal #2 03/27/2017   Patient Identified Goal "I will get my health back in order (attending medical and clinical appointments, nutrition/weight)".     Progress toward goal(s): No change. Comment: Pt has had brief interludes of sobriety and attending clinical appointments but has continued to struggle to attend medical appointments and this continued avoidance has become apoint of excessive anxiety and stress for pt due to ongoing health concerns that are unaddressed.Pt has moved back to [facility] at [facility] for CD tx due to pt's increase in drinking.       2a. Measurable Objectives : I will successfully complete [facility] treatment  Date established: 09/02/16  Target date: 09/27/2017  Attained or Revised? Continued    2b. Measurable Objectives : Elgin will discuss barriers to attending medical and clinical appointments and work with ACT Team to overcome these challenges to increase pt's compliance with treatment recommendation at least 1x a month.  Date established: 03/27/2017  Target date:  09/27/2017  Attained or Revised? new    2c. Measurable Objectives : [name]  will continue to explore and discuss cognitive dissonance between continued use of drug/alc and maintaining sobriety and the effects of pt's alc/drug use on his physical and mental health.  Date established: 03/27/2017  Target date: 09/27/2017  Attained or Revised? new        Mental Status Exam:  APPEARANCE: Casual  ATTITUDE TOWARD INTERVIEWER: Cooperative  MOTOR ACTIVITY: WNL (within normal limits)  EYE CONTACT: Direct  SPEECH: Normal rate and tone  AFFECT: Pleasant  MOOD: Normal  THOUGHT PROCESS: Circumstantial and Goal directed  THOUGHT CONTENT: Negative Rumination  PERCEPTION: No evidence of hallucinations  CURRENT SUICIDAL IDEATION: patient denies  CURRENT HOMICIDAL IDEATION: Patient denies  ORIENTATION: Alert and Oriented X 3.  CONCENTRATION: WNL  MEMORY:   Recent: intact   Remote: intact  COGNITIVE FUNCTION: Average intelligence  JUDGMENT: Intact  IMPULSE CONTROL: Fair  INSIGHT: Fair    Risk Assessment:  ASSESSMENT OF RISK FOR SUICIDAL BEHAVIOR  Changes in risk for suicide from baseline Formulation of Risk and/or previous intake, including newly identified risk, if any: none    Session Content::  Wtr arrived at pt's mother's residence. Pt came to the car and stated, "Is it okay for me to sit down? I don't have any urine on me". Wtr invited pt to sit down in the vehicle. Pt stated that he had not remembered he had urinated on himself and apologized for getting wtr's passenger seat wet. Wtr stated that wtr had not been aware that his pants were still wet as it was explained that he had urinated on himself during the night as he was severely intoxicated. Wtr had thought that pt's pants were dry at the time. Wtr explained that in the future, wtr would not transport pt, not only with wet clothing from urination, but would also ensure that pt was no longer  intoxicated from the previous night. Pt apologized for his intoxication when wtr and pt last met and stated that he did not mean to be "disrespectful of your time".     Wtr thanked pt for the apology and sentiment and validate pt's struggle to maintain abstinence from alcohol. Pt stated that he self-medicates with alcohol. Wtr asked pt how [facility]. Was going for CD treatment and pt stated that he had moved back down to [facility] since he has had an increase in his drinking. Pt rationalized his drinking since he had been experiencing so many stressors. Wtr validated pt's stressors and provided support. Wtr offered that pt could continue to work with the ACT Team to learn alternative, healthy coping skills. Pt stated that he would like to meet at [employer] again and have longer psychotherapy sessions. Wtr agreed that this could be beneficial for Levar's treatment and reviewed pt's other treatment goals for this treatment period. Pt received well and stated his motivation.     Wtr and pt stated goodbyes and wtr left.     Visit Diagnosis:    Major psychotic depression, recurrent- Primary ICD-10-CM: F33.3  ICD-9-CM: 296.34       Interventions:  Continued to develop therapeutic relationship  Reviewed and discussed Treatment Plan/Treatment Plan Review    Current Treatment Plan   Created/Updated On 03/27/2017   Next Treatment Plan Due 09/27/2017       Plan:  Psychotherapy continues as described in care plan; plan remains the same.    NEXT APPT: as per ACT calendar      Danise Edge, LMSW

## 2017-07-10 ENCOUNTER — Ambulatory Visit: Payer: Medicaid Other

## 2017-07-10 ENCOUNTER — Encounter: Payer: Self-pay | Admitting: Gastroenterology

## 2017-07-10 DIAGNOSIS — F333 Major depressive disorder, recurrent, severe with psychotic symptoms: Secondary | ICD-10-CM

## 2017-07-11 NOTE — Progress Notes (Signed)
Behavioral Health Progress Note     Length of Session: 15 minutes    Contact Type:  Location: Off Site    Face to Face     Problem(s)/Goals Addressed from Treatment Plan:    Problem 1:   Treatment Problem #1 02/21/2016   Patient Identified Problem unstable housing       Goal for this problem:    Treatment Goal #1 02/21/2016   Patient Identified Goal "I will get my own apartment asap".         Progress towards this goal: Crespin presents as pleasant, sober and psychiatically stable    Mental Status Exam:  APPEARANCE: Casual  ATTITUDE TOWARD INTERVIEWER: Cooperative  MOTOR ACTIVITY: WNL (within normal limits)  EYE CONTACT: Direct  SPEECH: Normal rate and tone  AFFECT: Full Range  MOOD: Normal  THOUGHT PROCESS: Normal  THOUGHT CONTENT: No unusual themes  PERCEPTION: Within normal limits  CURRENT SUICIDAL IDEATION: patient denies  CURRENT HOMICIDAL IDEATION: Patient denies  ORIENTATION: Alert and Oriented X 3.  CONCENTRATION: WNL  MEMORY:   Recent: intact   Remote: intact  COGNITIVE FUNCTION: Average intelligence  JUDGMENT: Intact  IMPULSE CONTROL: Fair  INSIGHT: Fair    Risk Assessment:  ASSESSMENT OF RISK FOR SUICIDAL BEHAVIOR  Changes in risk for suicide from baseline Formulation of Risk and/or previous intake, including newly identified risk, if any: none    Session Content::  Writer met with client this date for a scheduled home visit and medication delivery.  Keno found at this mother's residence where he greeted Probation officer with a smile.  He notes that he has been doing well and that he continues to be in CD treatment and that he saw his probation worker as scheduled.  He has yet to follow up with his medical providers but knows "I need to get back to it".  He offers that his medications "are right on time, I'm out".  Additionally,  Orry presenting as clear and sober and with no smell of alcohol in the air. His mood is stable, no reported safety concerns and no evidence of psychosis. Marland Kitchen  He further offers, " I might have  found a place to move".  He notes that it is a rooming house and that he has looked at it and likes the set up and landlord.  He reports that he has the first month of rent $500 but still will need assistance with the $250.  He confirms that Danise Edge is aware of his plans.      Visit Diagnosis:      ICD-10-CM ICD-9-CM   1. Major psychotic depression, recurrent F33.3 296.34       Interventions:  Medication delivery    Current Treatment Plan   Created/Updated On 02/21/2016   Next Treatment Plan Due 08/22/2016         Plan:  Psychotherapy continues as described in care plan; plan remains the same.    NEXT APPT: Per ACT schedule      Maryjane Hurter, LCSW

## 2017-07-17 ENCOUNTER — Ambulatory Visit: Payer: Self-pay | Admitting: Psychiatry

## 2017-07-17 ENCOUNTER — Encounter: Payer: Self-pay | Admitting: Psychiatry

## 2017-07-17 NOTE — Progress Notes (Signed)
[facility] MISSED/CANCELLED APPOINTMENT     Name: Derek NielsenRALPH D Sorg  MRN: [id number]   DOB: 1976/01/01    Date of Scheduled Service: 07/17/2017     Writer called Mr. Chuang to set up a time to meet today for psychopharm follow up. He stated that he had a doctor's appointment and he will be leaving shortly; however, he will be home by 2:30PM.   Writer called the patient, but there was not answer, but left a brieg message asking him to return the writer's call. Writer called again around 3PM and a gentleman answered the phone who stated that [name] was not home and he was not coming home until tomorrow and he did not where he went, and ended the call.I have discussed today's appointment with my team.     Additional Information:    Not applicable

## 2017-07-21 ENCOUNTER — Ambulatory Visit: Payer: Medicaid Other

## 2017-07-21 NOTE — Progress Notes (Signed)
[facility] MISSED/CANCELLED APPOINTMENT     Name: Sunnie NielsenRALPH D Vanhoose  MRN: [id number]   DOB: June 20, 1976    Date of Scheduled Service: 07/21/2017    Mr. Deller was a no show for today's appointment.  I have left the patient a message to contact me.    Additional Information:    Re-attempt visit 07/23/2017     Alinda MoneyShannon Iven Earnhart, LMSW

## 2017-07-23 ENCOUNTER — Ambulatory Visit: Payer: Medicaid Other | Attending: Psychiatry

## 2017-07-23 DIAGNOSIS — F323 Major depressive disorder, single episode, severe with psychotic features: Secondary | ICD-10-CM | POA: Insufficient documentation

## 2017-07-23 DIAGNOSIS — F333 Major depressive disorder, recurrent, severe with psychotic symptoms: Secondary | ICD-10-CM | POA: Insufficient documentation

## 2017-07-23 NOTE — Progress Notes (Signed)
ACT Team Clinical Management Note     Type of visit: Collateral contact with pt's mother  Duration: 15 minutes    Wtr arrived at pt's mother's residence and knocked on the side door. [name] came to the door and stated that pt had just left and was headed to probation. Wtr asked if pt would be expected back later and Gavin PoundDeborah stated that he may be. Wtr asked how things were going with Rayna Sextonalph and Gavin PoundDeborah as last time wtr had a conversation with the both of them, there was some familial conflict. Gavin PoundDeborah stated, "He really needs to get his own place. Him and [name] are back and forth, have been like that, for a long time now. And when he leaves from over there, he comes over here and aggravates me. He needs to get his own place". Wtr provided support and stated that the ACT Team has offered help to [name] in finding an apartment as well as offering assistance to budget, open a bank account and/or hold on to Stanislaus's money to save, as well as offering partial contribution for security deposit. Gavin PoundDeborah stated, "Well can you please remind him of that? Because he really needs to get his own place, he really does." Wtr agreed to review with pt. Wtr asked pt's mother to have [name] call wtr if she was in contact with him as wtr had a monthly bus pass and medications to deliver. Wtr and Gavin PoundDeborah said goodbyes and wtr left.    Plan of action: re-attempt to deliver medications, as per ACT calendar    Alinda MoneyShannon Nakeem Murnane, LMSW

## 2017-07-24 ENCOUNTER — Ambulatory Visit: Payer: Medicaid Other

## 2017-07-24 NOTE — Progress Notes (Signed)
Behavioral Health Progress Note     Length of Session: 60 minutes    Contact Type:  Location: Off Site    Face to Face   Problem(s)/Goals Addressed from Treatment Plan:  Treatment Problem #1 03/27/2017, continued from 02/21/2016   Patient Identified Problem Alc/drugs and instability at pt's s/o's and mother's residences       Treatment Goal #1 03/27/2017, continued from 02/21/2016   Patient Identified Goal "I will get my own apartment asap".      Progress toward goal(s): No change. Comment: Pt has accepted budgeting assistance on this date 07/03/17 and stated that on 07/17/17 he would like to give the ACT Team a security deposit of $400 to hold for him until he finds an apartment. Pt would have $300 remainder for monthly expenses.  Derek Walker reported consistently moving back and forth between his mother's and [name]'s (s/o) residences due to conflict. Pt reported motivation to "take care of me" and to find his own apartment. Pt stated that he was looking at apartments on a list that he received for affordable housing [facility]     On this date pt reports that he has spent the majority of his money "paying dues" from "living between two households" at his mother's and [name]'s respective residences. Wtr reviews with pt that respite/crisis housing is available to him at [facility]. Pt states that he is considering and remains in the contemplative stage of change.       1 a. Measurable Objectives : Derek Walker will work with ACT Team 3x a month to form a monthly budget, check-in with ongoing management of finances throughout month, and to note any adjustments that may need to be made after reviewing the month's income and overall expenses  Date established: 03/27/2017  Target date: 09/27/2017  Attained or Revised? new    1 b. Measurable Objectives :I will meet with ACT staff at least twice monthly to discuss and receive assistance in searching for  safe and affordable housing, scheduling tours, making phone calls, completing applications, etc. As demonstrated by Derek Sextonalph having a safe and affordable place to live in the next 6 months.  Date established:09/02/2016 and continued on from this date 03/27/2017  Target date: 09/27/2017  Attained or Revised? Revised    1 c. Measurable Objectives : Derek Walker will continue to utilize clinical appointments with the ACT Team to address communication and conflict with s/o, pt's mother, and other family members to assist pt in establishing healthy boundary lines and communication styles  Date established: 03/27/2017  Target date: 09/27/2017  Attained or Revised? new  ..........................................................................................................................................    Treatment Problem #2 03/27/2017   Patient Identified Problem poor physical and mental health       Treatment Goal #2 03/27/2017   Patient Identified Goal "I will get my health back in order (attending medical and clinical appointments, nutrition/weight)".     Progress toward goal(s): No change. Comment: Pt has had brief interludes of sobriety and attending clinical appointments but has continued to struggle to attend medical appointments and this continued avoidance has become apoint of excessive anxiety and stress for pt due to ongoing health concerns that are unaddressed.Pt has [facility] due to pt's increase in drinking.     On this date pt has considered checking into detox/inpatient rehab for CD treatment. Pt has not yet made a decision and continues to explore cognitive dissonance between active alcoholism and recovery.  2a. Measurable Objectives : I will successfully complete [name]  treatment  Date established: 09/02/16  Target date: 09/27/2017  Attained or Revised? Continued    2b. Measurable Objectives : Vinson will discuss barriers to attending medical and clinical appointments and work with ACT Team to overcome these challenges to increase pt's compliance with treatment recommendation at least 1x a month.  Date established: 03/27/2017  Target date: 09/27/2017  Attained or Revised? new    2c. Measurable Objectives : Kalai will continue to explore and discuss cognitive dissonance between continued use of drug/alc and maintaining sobriety and the effects of pt's alc/drug use on his physical and mental health.  Date established: 03/27/2017  Target date: 09/27/2017  Attained or Revised? new        Mental Status Exam:  APPEARANCE: Casual  ATTITUDE TOWARD INTERVIEWER: Cooperative  MOTOR ACTIVITY: WNL (within normal limits)  EYE CONTACT: Direct  SPEECH: Normal rate and tone  AFFECT: Pleasant and Depressed  MOOD: Depressed  THOUGHT PROCESS: Circumstantial  THOUGHT CONTENT: Negative Rumination  PERCEPTION: No evidence of hallucinations  CURRENT SUICIDAL IDEATION: vague/passive  CURRENT HOMICIDAL IDEATION: Patient denies  ORIENTATION: Alert and Oriented X 3.  CONCENTRATION: Fair  MEMORY:   Recent: intact   Remote: intact  COGNITIVE FUNCTION: Average intelligence  JUDGMENT: Impaired -  mild  IMPULSE CONTROL: Fair  INSIGHT: Fair    Risk Assessment:  ASSESSMENT OF RISK FOR SUICIDAL BEHAVIOR  Changes in risk for suicide from baseline Formulation of Risk and/or previous intake, including newly identified risk, if any: new suicidal ideation    Session Content::  Wtr calls pt's mother's residence and pt's mother states that she has not seen Derek Sexton since yesterday before "he left for his probation appointment". Wtr  thanks pt's mother and drives to pt's s/o's residence on [address]. Wtr knocks on the door and pt's s/o's son, Briant Cedar, answered the door and asks if wtr is "looking for D?" Pt's family members refer to pt by his middle name, Derek Walker, and in this case Derek Walker refers to pt by "D". Wtr confirms. Derek Walker states that he will get pt for wtr.     Wtr waits just inside of the front door for pt. Pt states that he has felt ill and believes he has the flu. Pt states that he went to [facility] yesterday but was not prescribed any medication, stating, "They saw that I was on some kind of meds and decided that they couldn't give me anything because of how it was effect my other meds". Pt stated that he was resting and would continue to do so.    Wtr delievered medications to pt. Pt stated that he had old medications to return to wtr and got them from another room. Wtr returned these medications to ACT Team Office and informed ACT RNs. Pt stated that he had been feeling depressed and "like maybe I don't want to deal with it all anymore". Wtr provided safety assessment and pt stated that although he thought of overdosing on his prescribed medications, that he would not do so. Pt was able to thoroughly safety contract with wtr and denied suicidal intent. Pt stated that major stressors in his life currently were finances and conflict within both households that pt resides at. Wtr again offered to refer pt to [facility]. Pt stated that since he was not feeling well that he would not go at this time but would notify wtr if he changed his mind. Wtr offered  resource information for [facility] if pt wanted to complete self-referral over the phone. Pt was appreciative.    Wtr asked if pt had gone to probation this week and pt stated that he had not and that his PO, [address], and told pt to stay home if he was ill as his PO was just getting over being sick. Wtr asked how pt was doing with drinking and pt stated, "None. I have not been  drinking at all". Pt returned to negative rumination regarding conflict with [name] as well as conflict between his cousins and between pt and his mother while at his mother's residence. Pt stated, "I swear sometimes, I'm ready to just serve my time and not mess with any of this stuff. Or go to inpatient or something". Wtr asked pt if he thought either of those options were truly viable and pt stated that he would go to inpatient "to get myself together. Dry out. Work on me". Pt clarified that he is not a "raging alcoholic" but categorized his drinking as problematic. Wtr commended pt for prioritizing his own wellness and chose not to challenge pt as he had stated in this visit that he had not been drinking at all but had just referred to "drying out". Wtr offered to complete referral for detox and/or inpatient CD treatment and pt stated that he would like to meet with wtr on Monday 07/28/17 at the library at 11am so he can "really talk about everything that's going on. I can't talk about it here". Wtr reminded pt that he could go to respite shelter if he felt unsafe and pt stated that he would if necessary. Wtr agreed to meeting with pt. Wtr will provide supportive psychotherapy to pt regarding ongoing conflict between both households, but will bring pt's attention back to what is in his control-- prioritizing his own wellness. Pt has not yet made a decision and remains in the contemplative stage of change and continues to explore cognitive dissonance between active CD and recovery.     Wtr and pt said goodbyes and wtr left.         Visit Diagnosis:    Major psychotic depression, recurrent- Primary ICD-10-CM: F33.3  ICD-9-CM: 296.34       Interventions:  Continued to develop therapeutic relationship  Identified adaptive/maladaptive family patterns  Provided therapeutic support during discussion of distressing/traumatic events and/or symptoms  Safety Planning.  Refer to safety plan document that was reviewed at  today's visit.  Supportive Psychotherapy   Motivational Interviewing  Resource information given for [facility], Crisis/respite housing  Wtr delivered monthly bus pass    Current Treatment Plan   Created/Updated On 03/27/2017   Next Treatment Plan Due 09/27/2017       Plan:  Psychotherapy continues as described in care plan; plan remains the same. and Other planned interventions or recommendations: Pt is in contemplative phase of change, considering finding respite/crisis shelter at [facility] and considering checking into detox/inpatient rehab for CD    NEXT APPT:  07/28/2017, as per ACT calendar      Alinda MoneyShannon Analiese Krupka, LMSW

## 2017-07-28 ENCOUNTER — Ambulatory Visit: Payer: Medicaid Other

## 2017-07-28 NOTE — Progress Notes (Signed)
Behavioral Health Progress Note     Length of Session: 90 minutes    Contact Type:  Location: Off Site    Face to Face     Problem(s)/Goals Addressed from Treatment Plan:  Treatment Problem #1 03/27/2017, continued from 02/21/2016   Patient Identified Problem Alc/drugs and instability at pt's s/o's and mother's residences       Treatment Goal #1 03/27/2017, continued from 02/21/2016   Patient Identified Goal "I will get my own apartment asap".      Progress toward goal(s): No change. Comment: Pt has acceptedbudgeting assistance on this date 07/03/17 and stated that on 07/17/17 he would like to give the ACT Team a security deposit of $400 to hold for him until he finds an apartment. Pt would have $300 remainder for monthly expenses. Derek Walker reported consistently moving back and forth between his mother's and Derek Walker's (s/o) residences due to conflict. Pt reported motivation to "take care of me" and to find his own apartment. Pt stated that he was looking at apartments on a list that he received for affordable [facility]     On this date pt reports that he has spent the majority of his money "paying dues" from "living between two households" at his mother's and Derek Walker's respective residences. Wtr reviews with pt that respite/crisis housing is available to him at [facility]. Pt states that he is considering and remains in the contemplative stage of change.       1 a. Measurable Objectives : Derek Walker will work with ACT Team 3x a month to form a monthly budget, check-in with ongoing management of finances throughout month, and to note any adjustments that may need to be made after reviewing the month's income and overall expenses  Date established: 03/27/2017  Target date: 09/27/2017  Attained or Revised? new    1 b. Measurable Objectives :I will meet with ACT staff at least twice monthly to discuss and receive assistance in searching  for safe and affordable housing, scheduling tours, making phone calls, completing applications, etc. As demonstrated by Derek Walker having a safe and affordable place to live in the next 6 months.  Date established:09/02/2016 and continued on from this date 03/27/2017  Target date: 09/27/2017  Attained or Revised? Revised    1 c. Measurable Objectives : Derek Walker will continue to utilize clinical appointments with the ACT Team to address communication and conflict with s/o, pt's mother, and other family members to assist pt in establishing healthy boundary lines and communication styles  Date established: 03/27/2017  Target date: 09/27/2017  Attained or Revised? new  ..........................................................................................................................................    Treatment Problem #2 03/27/2017   Patient Identified Problem poor physical and mental health       Treatment Goal #2 03/27/2017   Patient Identified Goal "I will get my health back in order (attending medical and clinical appointments, nutrition/weight)".     Progress toward goal(s): No change. Comment: Pt has had brief interludes of sobriety and attending clinical appointments but has continued to struggle to attend medical appointments and this continued avoidance has become apoint of excessive anxiety and stress for pt due to ongoing health concerns that are unaddressed.Pt has moved back to [facility] for CD tx due to pt's increase in drinking.     On 07/24/17, pt has considered checking into detox/inpatient rehab for CD treatment. Pt has not yet made a decision and continues to explore cognitive dissonance between active alcoholism and recovery.  On this date 07/28/17, pt continued to explore CD treatment options. Pt refused going to detox/inpatient at  this time due to pt's s/o's pending court date 07/31/17-- it will be decided if pt's s/o is serving 1 year in [facility] or receiving 5 years probation and serve weekends in jail-- this will alter pt's plan as he could possibly become primary caretaker for daughter "Derek" Chyrl Walker(AKA Derek Walker, 41 y/o).       2a. Measurable Objectives : I will [facility]  Date established: 09/02/16  Target date: 09/27/2017  Attained or Revised? Continued    2b. Measurable Objectives : Derek Walker will discuss barriers to attending medical and clinical appointments and work with ACT Team to overcome these challenges to increase pt's compliance with treatment recommendation at least 1x a month.  Date established: 03/27/2017  Target date: 09/27/2017  Attained or Revised? new    2c. Measurable Objectives : Derek Walker will continue to explore and discuss cognitive dissonance between continued use of drug/alc and maintaining sobriety and the effects of pt's alc/drug use on his physical and mental health.  Date established: 03/27/2017  Target date: 09/27/2017  Attained or Revised? new      Mental Status Exam:  APPEARANCE: Well-groomed, Casual  ATTITUDE TOWARD INTERVIEWER: Cooperative  MOTOR ACTIVITY: WNL (within normal limits)  EYE CONTACT: Direct  SPEECH: Normal rate and tone  AFFECT: Pleasant  MOOD: Normal  THOUGHT PROCESS: Circumstantial, Goal directed and Loose associations  THOUGHT CONTENT: Negative Rumination  PERCEPTION: Within normal limits and No evidence of hallucinations  CURRENT SUICIDAL IDEATION: patient denies  CURRENT HOMICIDAL IDEATION: Patient denies  ORIENTATION: Alert and Oriented X 3.  CONCENTRATION: Good  MEMORY:   Recent: intact   Remote: intact  COGNITIVE FUNCTION: Average  intelligence  JUDGMENT: Intact  IMPULSE CONTROL: Fair  INSIGHT: Fair    Risk Assessment:  ASSESSMENT OF RISK FOR SUICIDAL BEHAVIOR  Changes in risk for suicide from baseline Formulation of Risk and/or previous intake, including newly identified risk, if any: none    Session Content: [facility] [address] and realized that [employer] was closed due to observance of Veteran's Day. Wtr called pt's mother's residence hoping to catch pt to inform him that wtr and pt would need to meet elsewhere. [name], Derek Walker, stated pt had already left to meet wtr. Wtr thanked Derek Walker and waited in front of [employer] [facility] to see if pt would show. After 20 minutes, wtr left and headed to pt's s/o's residence on [address]. Wtr arrived at pt's s/o's residence Rosario Adie(Derek Walker) and pt answered the door.  Wtr and pt discussed library being closed due to [employer] and pt chose to go to coffee at [name]. Wtr transported pt to [name] and wtr and pt sat and discussed current circumstance for pt, symptoms, and wtr practiced motivational interviewing with pt.     Pt continued to explore CD treatment options. Pt refused going to detox/inpatient at this time due to pt's s/o's (Derek Walker) pending court date 07/31/17-- it will be decided if pt's s/o is serving 1 year in [facility] or receiving 5 years probation and serve weekends in jail-- this will alter pt's plan as he could possibly become primary caretaker for daughter "Derek" Chyrl Walker(AKA Derek Walker, 41 y/o). Wtr and pt discussed motivation to begin and maintain CD treatment/recovery. [facility] CD group at 1:15pm. Wtr and pt said goodbyes and wtr left.  Visit Diagnosis:    Major psychotic depression, recurrent- Primary ICD-10-CM: F33.3  ICD-9-CM: 296.34       Interventions:  Continued to develop therapeutic relationship  Motivational Interviewing  Reviewed and discussed Treatment Plan/Treatment Plan Review    Current Treatment Plan    Created/Updated On 03/27/2017   Next Treatment Plan Due 09/27/2017       Plan:  Psychotherapy continues as described in care plan; plan remains the same.    NEXT APPT: 07/30/2017, as per ACT calendar      [name], LMSW
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
## 2017-07-30 ENCOUNTER — Ambulatory Visit: Payer: Medicaid Other

## 2017-07-31 ENCOUNTER — Ambulatory Visit: Payer: Medicaid Other

## 2017-07-31 NOTE — Progress Notes (Signed)
Behavioral Health Progress Note     Length of Session: 20 minutes    Contact Type:  Location: Off Site    Face to Face     Problem(s)/Goals Addressed from Treatment Plan:  Treatment Problem #1 03/27/2017, continued from 02/21/2016   Patient Identified Problem Alc/drugs and instability at pt's s/o's and mother's residences       Treatment Goal #1 03/27/2017, continued from 02/21/2016   Patient Identified Goal "I will get my own apartment asap".      Progress toward goal(s): No change. Comment: Pt has acceptedbudgeting assistance on 07/03/17 and stated that on 07/17/17 he would like to give the ACT Team a security deposit of $400 to hold for him until he finds an apartment. Pt would have $300 remainder for monthly expenses. Bereket reported consistently moving back and forth between his mother's and [name]'s (s/o) residences due to conflict. Pt reported motivation to "take care of me" and to find his own apartment. Pt stated that he was looking at apartments on a list that he received for affordable housing from [facility].     On 07/28/17, pt reports that he has spent the majority of his money "paying dues" from "living between two households" at his mother's and [name]'s respective residences. Wtr reviews with pt that respite/crisis housing is available to him at [employer]. Pt states that he is considering and remains in the contemplative stage of change.      On this date, pt stated motivation to save his money in December to budget for his own housing. Wtr provided MI and support.       1 a. Measurable Objectives : Kvon will work with ACT Team 3x a month to form a monthly budget, check-in with ongoing management of finances throughout month, and to note any adjustments that may need to be made after reviewing the month's income and overall expenses  Date established: 03/27/2017  Target date: 09/27/2017  Attained or Revised? new    1 b.  Measurable Objectives :I will meet with ACT staff at least twice monthly to discuss and receive assistance in searching for safe and affordable housing, scheduling tours, making phone calls, completing applications, etc. As demonstrated by [name] having a safe and affordable place to live in the next 6 months.  Date established:09/02/2016 and continued on from this date 03/27/2017  Target date: 09/27/2017  Attained or Revised? Revised    1 c. Measurable Objectives : Kincaid will continue to utilize clinical appointments with the ACT Team to address communication and conflict with s/o, pt's mother, and other family members to assist pt in establishing healthy boundary lines and communication styles  Date established: 03/27/2017  Target date: 09/27/2017  Attained or Revised? new  ..........................................................................................................................................    Treatment Problem #2 03/27/2017   Patient Identified Problem poor physical and mental health       Treatment Goal #2 03/27/2017   Patient Identified Goal "I will get my health back in order (attending medical and clinical appointments, nutrition/weight)".     Progress toward goal(s): No change. Comment: Pt has had brief interludes of sobriety and attending clinical appointments but has continued to struggle to attend medical appointments and this continued avoidance has become apoint of excessive anxiety and stress for pt due to ongoing health concerns that are unaddressed.Pt has moved back to Phase II at [facility] for CD tx due to pt's increase in drinking.     On 07/24/17, pt has considered checking into  detox/inpatient rehab for CD treatment. Pt has not yet made a decision and continues to explore cognitive dissonance between active alcoholism and  recovery.     On 07/28/17, pt continued to explore CD treatment options. Pt refused going to detox/inpatient at this time due to pt's s/o's pending court date 07/31/17-- it will be decided if pt's s/o is serving 1 year in [place] or receiving 5 years probation and serve weekends in jail-- this will alter pt's plan as he could possibly become primary caretaker for daughter "[name]" Cleda Mccreedy, 89 y/o).       2a. Measurable Objectives : I will successfully complete [facility]  Date established: 09/02/16  Target date: 09/27/2017  Attained or Revised? Continued    2b. Measurable Objectives : Elvan will discuss barriers to attending medical and clinical appointments and work with ACT Team to overcome these challenges to increase pt's compliance with treatment recommendation at least 1x a month.  Date established: 03/27/2017  Target date: 09/27/2017  Attained or Revised? new    2c. Measurable Objectives : Zeshan will continue to explore and discuss cognitive dissonance between continued use of drug/alc and maintaining sobriety and the effects of pt's alc/drug use on his physical and mental health.  Date established: 03/27/2017  Target date: 09/27/2017  Attained or Revised? new      Mental Status Exam:  APPEARANCE: Well-groomed, Casual  ATTITUDE TOWARD INTERVIEWER: Cooperative  MOTOR ACTIVITY: WNL (within normal limits)  EYE CONTACT: Direct  SPEECH: Normal rate and tone  AFFECT: Pleasant  MOOD: Normal  THOUGHT PROCESS: Circumstantial and Goal directed  THOUGHT CONTENT: No unusual themes  PERCEPTION: Within normal limits and No evidence of hallucinations  CURRENT SUICIDAL IDEATION: patient denies  CURRENT HOMICIDAL IDEATION: Patient denies  ORIENTATION: Alert and Oriented X  3.  CONCENTRATION: WNL  MEMORY:   Recent: intact   Remote: intact  COGNITIVE FUNCTION: Average intelligence  JUDGMENT: Intact  IMPULSE CONTROL: Good  INSIGHT: Good    Risk Assessment:  ASSESSMENT OF RISK FOR SUICIDAL BEHAVIOR  Changes in risk for suicide from baseline Formulation of Risk and/or previous intake, including newly identified risk, if any: none    Session Content::  Wtr met pt at his mother's residence on [address] Pt stated that he had been at [employer] residence the night before and [name] had asked him to cook dinner. Pt stated that he agreed to cook dinner but went into the kitchen and it was "a big mess". Pt stated that he had said "somebody clean the kitchen, and then I'll cook dinner". Pt states that at this point, [name] retorted that she would just clean and cook herself. Pt stated he sat in the living room and when [name] and the kids came out with their plates, pt went into the kitchen to get his own, and there was no food left. Pt stated that he felt this was immature of [name] and did not appreciate. Pt stated that [name] explained she began to thaw out something else for pt to eat and pt stated it would have been too late at that point as pt was already hungry. Pt stated that there was no yelling, pt simply handed his apartment keys to [name] and left. Pt stated he had a relaxing evening with his Uncle at his mother's residence cooking mac and cheese and baked chicken and watching old movies until 2am. Pt stated he woke up early and had attended his probation appointment that morning, earlier  than scheduled. Pt stated before heading off to probation he got up and had coffee with his mother, pt stated, "when we went for coffee the other day, I remembered that I like coffee. So my mom made some coffee and pancakes."     Pt stated his positive outlook although it seemed to wtr that this was still tied in some way to rejection of his relationship with [name], perhaps partially out of spite. [place]  stated that if [name] wasn't going to cook him food, he would conveniently not go to her court date on 08/01/17 nor [name]'s court date on 08/02/17, [name]'s son. Wtr discussed communication styles with pt and recommended alternatives to pt leaving [name]'s house and handing her his key to the apartment. Pt was engaged and listened and then came back with that "this time" he was really done with the relationship. Wtr provided validation and support while also challenging pt to contemplate conflict resolution.     Wtr delivered medications to pt as per pt's current medication list. Wtr delivered 2 bus passes for pt to attend treatment appointments. Wtr and pt said goodbyes and wtr left.         Visit Diagnosis:    Major psychotic depression, recurrent- Primary ICD-10-CM: F33.3  ICD-9-CM: 296.34       Interventions:  Continued to develop therapeutic relationship  Reviewed and discussed Treatment Plan/Treatment Plan Review  Supportive Psychotherapy  Delivered medications as per pt's current medication list    Current Treatment Plan   Created/Updated On 03/27/2017   Next Treatment Plan Due 09/27/2017       Plan:  Psychotherapy continues as described in care plan; plan remains the same.    NEXT APPT: as per ACT calendar    Danise Edge, LMSW

## 2017-08-04 ENCOUNTER — Ambulatory Visit: Payer: Medicaid Other

## 2017-08-05 ENCOUNTER — Ambulatory Visit: Payer: Medicaid Other

## 2017-08-05 NOTE — Progress Notes (Signed)
Behavioral Health Progress Note     Length of Session: 15 minutes    Contact Type:  Location: Off Site    Face to Face     Problem(s)/Goals Addressed from Treatment Plan:  Treatment Problem #1 03/27/2017, continued from 02/21/2016   Patient Identified Problem Alc/drugs and instability at pt's s/o's and mother's residences       Treatment Goal #1 03/27/2017, continued from 02/21/2016   Patient Identified Goal "I will get my own apartment asap".      Progress toward goal(s): No change. Comment: Pt has acceptedbudgeting assistance on 07/03/17 and stated that on 07/17/17 he would like to give the ACT Team a security deposit of $400 to hold for him until he finds an apartment. Pt would have $300 remainder for monthly expenses. Derek SextonRalph reported consistently moving back and forth between his mother's and Derek Walker's (s/o) residences due to conflict. Pt reported motivation to "take care of me" and to find his own apartment. Pt stated that he was looking at apartments on a list that he [facility]     On 07/28/17, pt reports that he has spent the majority of his money "paying dues" from "living between two households" at his mother's and Derek Walker's respective residences. Wtr reviews with pt that respite/crisis housing is available to him at [employer] [facility]. Pt states that he is considering and remains in the contemplative stage of change.      On 07/30/17, pt stated motivation to save his money in December to budget for his own housing. Wtr provided MI and support.       1 a. Measurable Objectives : Derek SextonRalph will work with ACT Team 3x a month to form a monthly budget, check-in with ongoing management of finances throughout month, and to note any adjustments that may need to be made after reviewing the month's income and overall expenses  Date established: 03/27/2017  Target date: 09/27/2017  Attained or Revised? new    1 b.  Measurable Objectives :I will meet with ACT staff at least twice monthly to discuss and receive assistance in searching for safe and affordable housing, scheduling tours, making phone calls, completing applications, etc. As demonstrated by Derek Sextonalph having a safe and affordable [place] to live in the next 6 months.  Date established:09/02/2016 and continued on from this date 03/27/2017  Target date: 09/27/2017  Attained or Revised? Revised    1 c. Measurable Objectives : Derek SextonRalph will continue to utilize clinical appointments with the ACT Team to address communication and conflict with s/o, pt's mother, and other family members to assist pt in establishing healthy boundary lines and communication styles  Date established: 03/27/2017  Target date: 09/27/2017  Attained or Revised? new  ..........................................................................................................................................    Treatment Problem #2 03/27/2017   Patient Identified Problem poor physical and mental health       Treatment Goal #2 03/27/2017   Patient Identified Goal "I will get my health back in order (attending medical and clinical appointments, nutrition/weight)".     Progress toward goal(s): No change. Comment: Pt has had brief interludes of sobriety and attending clinical appointments but has continued to struggle to attend medical appointments and this continued avoidance has become apoint of excessive anxiety and stress for pt due to ongoing health concerns that are unaddressed.Pt has [facility] due to pt's increase in drinking.     On 07/24/17, pt has considered checking into detox/inpatient  rehab for CD treatment. Pt has not yet made a decision and continues to explore cognitive dissonance between active alcoholism and  recovery.     On 07/28/17, pt continued to explore CD treatment options. Pt refused going to detox/inpatient at this time due to pt's s/o's pending court date 07/31/17-- it will be decided if pt's s/o is serving 1 year in [place] or receiving 5 years probation and serve weekends in jail-- this will alter pt's plan as he could possibly become primary caretaker for daughter "Derek Walker" Derek Walker, 41 y/o).     On this date 08/05/17, pt appeared to be mildly intoxicated from the previous evening. Wtr did not address this time and ACT Team will address when meeting with pt on next visit as long as pt is sober.      2a. Measurable Objectives : [facility] will [facility]  Date established: 09/02/16  Target date: 09/27/2017  Attained or Revised? Continued    2b. Measurable Objectives : Derek Walker will discuss barriers to attending medical and clinical appointments and work with ACT Team to overcome these challenges to increase pt's compliance with treatment recommendation at least 1x a month.  Date established: 03/27/2017  Target date: 09/27/2017  Attained or Revised? new    2c. Measurable Objectives : Derek Walker will continue to explore and discuss cognitive dissonance between continued use of drug/alc and maintaining sobriety and the effects of pt's alc/drug use on his physical and mental health.  Date established: 03/27/2017  Target date: 09/27/2017  Attained or Revised? new      Mental Status Exam:   APPEARANCE: Casual, Disheveled  ATTITUDE TOWARD INTERVIEWER: Cooperative  MOTOR ACTIVITY: WNL (within normal limits)  EYE CONTACT: Avoidant  SPEECH: Slurred  AFFECT: Anxious and Pleasant  MOOD: Anxious  THOUGHT PROCESS: Circumstantial  THOUGHT CONTENT: No unusual  themes  PERCEPTION: Within normal limits and No evidence of hallucinations  CURRENT SUICIDAL IDEATION: patient denies  CURRENT HOMICIDAL IDEATION: Patient denies  ORIENTATION: Alert and Oriented X 3.  CONCENTRATION: Fair  MEMORY:   Recent: intact   Remote: intact  COGNITIVE FUNCTION: Average intelligence  JUDGMENT: Impaired -  Pt appeared to be mildly intoxiated from the previous evening  IMPULSE CONTROL: Poor  INSIGHT: Fair    Risk Assessment:  ASSESSMENT OF RISK FOR SUICIDAL BEHAVIOR  Changes in risk for suicide from baseline Formulation of Risk and/or previous intake, including newly identified risk, if any: none    Session Content::  Wtr arrived at pt's mother's residence and knocked on the door. Pt answered gleefully greeting wtr. Pt avoided eye contact and appeared to sway rather than standing firmly upright. Pt stated that he had just woken up at 11:40am and that "everyone in the house is just getting up now". Pt stated that he had CD group at [facility] and would be getting in the shower to get ready. Wtr delivered three [employer] gift cards to pt per pt's request for assistance for Thanksgiving dinner. Pt signed for the gift cards and his signature appeared sloppier. Pt appeared to be intoxicated from the previous evening. Wtr did not address at this time as it was thought that discussing intervention would be more helpful to pt when sober. Wtr and pt said goodbyes and wtr left.    Visit Diagnosis:    Major psychotic depression, recurrent- Primary ICD-10-CM: F33.3  ICD-9-CM: 296.34     Interventions:  Continued to develop therapeutic relationship  Motivational Interviewing  Reviewed and  discussed Treatment Plan/Treatment Plan Review    Current Treatment Plan   Created/Updated On 03/27/2017   Next Treatment Plan Due 09/27/2017       Plan:  Psychotherapy continues as described in care plan; plan remains the same. and Other planned interventions or recommendations: Cx motivational interviewing with pt per  stages of change re: pt's chemical dependency/alcohol use.     NEXT APPT: 08/11/2017, as per ACT calendar      Alinda MoneyShannon Shayne Deerman, LMSW

## 2017-08-06 NOTE — Progress Notes (Signed)
Behavioral Health Progress Note     Length of Session: 30 minutes    Contact Type:  Location: Off Site    Face to Face     Problem(s)/Goals Addressed from Treatment Plan:  Treatment Problem #1 03/27/2017, continued from 02/21/2016   Patient Identified Problem Alc/drugs and instability at pt's s/o's and mother's residences       Treatment Goal #1 03/27/2017, continued from 02/21/2016   Patient Identified Goal "I will get my own apartment asap".      Progress toward goal(s): No change. Comment: Pt has acceptedbudgeting assistance on 07/03/17 and stated that on 07/17/17 he would like to give the ACT Team a security deposit of $400 to hold for him until he finds an apartment. Pt would have $300 remainder for monthly expenses. Rufino reported consistently moving back and forth between his mother's and [name]'s (s/o) residences due to conflict. Pt reported motivation to "take care of me" and to find his own apartment. Pt stated that he was looking at apartments on a list that he received for affordable housing from [facility].     On 07/28/17, pt reports that he has spent the majority of his money "paying dues" from "living between two households" at his mother's and [name]'s respective residences. Wtr reviews with pt that respite/crisis housing is available to him at [facility]. Pt states that he is considering and remains in the contemplative stage of change.     On 07/30/17, pt stated motivation to save his money in December to budget for his own housing. Wtr provided MI and support.       1 a. Measurable Objectives : Sparsh will work with ACT Team 3x a month to form a monthly budget, check-in with ongoing management of finances throughout month, and to note any adjustments that may need to be made after reviewing the month's income and overall expenses  Date established: 03/27/2017  Target date: 09/27/2017  Attained or Revised? new    1 b.  Measurable Objectives :I will meet with ACT staff at least twice monthly to discuss and receive assistance in searching for safe and affordable housing, scheduling tours, making phone calls, completing applications, etc. As demonstrated by [name] having a safe and affordable place to live in the next 6 months.  Date established:09/02/2016 and continued on from this date 03/27/2017  Target date: 09/27/2017  Attained or Revised? Revised    1 c. Measurable Objectives : Fabio will continue to utilize clinical appointments with the ACT Team to address communication and conflict with s/o, pt's mother, and other family members to assist pt in establishing healthy boundary lines and communication styles  Date established: 03/27/2017  Target date: 09/27/2017  Attained or Revised? new  ..........................................................................................................................................    Treatment Problem #2 03/27/2017   Patient Identified Problem poor physical and mental health       Treatment Goal #2 03/27/2017   Patient Identified Goal "I will get my health back in order (attending medical and clinical appointments, nutrition/weight)".     Progress toward goal(s): No change. Comment: Pt has had brief interludes of sobriety and attending clinical appointments but has continued to struggle to attend medical appointments and this continued avoidance has become apoint of excessive anxiety and stress for pt due to ongoing health concerns that are unaddressed.Pt has moved back to Phase [facility] at [facility] for CD tx due to pt's increase in drinking.     On 07/24/17, pt has considered checking into detox/inpatient rehab  for CD treatment. Pt has not yet made a decision and continues to explore cognitive dissonance between active alcoholism and  recovery.     On 07/28/17, pt continued to explore CD treatment options. Pt refused going to detox/inpatient at this time due to pt's s/o's pending court date 07/31/17-- it will be decided if pt's s/o is serving 1 year in [place] or receiving 5 years probation and serve weekends in jail-- this will alter pt's plan as he could possibly become primary caretaker for daughter "[name]" Cleda Mccreedy, 41 y/o).         2a. Measurable Objectives : I will successfully complete [place] [facility] treatment  Date established: 09/02/16  Target date: 09/27/2017  Attained or Revised? Continued    2b. Measurable Objectives : Nehemiah will discuss barriers to attending medical and clinical appointments and work with ACT Team to overcome these challenges to increase pt's compliance with treatment recommendation at least 1x a month.  Date established: 03/27/2017  Target date: 09/27/2017  Attained or Revised? new    2c. Measurable Objectives : Rue will continue to explore and discuss cognitive dissonance between continued use of drug/alc and maintaining sobriety and the effects of pt's alc/drug use on his physical and mental health.  Date established: 03/27/2017  Target date: 09/27/2017  Attained or Revised? new        Mental Status Exam:  APPEARANCE: Well-groomed, Casual  ATTITUDE TOWARD INTERVIEWER: Cooperative  MOTOR ACTIVITY: WNL (within normal limits)  EYE CONTACT: Direct  SPEECH: Normal rate and tone  AFFECT: Pleasant  MOOD: Normal  THOUGHT PROCESS: Circumstantial and Goal directed  THOUGHT CONTENT: No unusual themes  PERCEPTION: Within normal limits and No evidence of hallucinations  CURRENT SUICIDAL IDEATION: patient denies  CURRENT HOMICIDAL IDEATION: Patient denies  ORIENTATION: Alert and Oriented X  3.  CONCENTRATION: WNL  MEMORY:   Recent: intact   Remote: intact  COGNITIVE FUNCTION: Average intelligence  JUDGMENT: Intact  IMPULSE CONTROL: Fair  INSIGHT: Fair    Risk Assessment:  ASSESSMENT OF RISK FOR SUICIDAL BEHAVIOR  Changes in risk for suicide from baseline Formulation of Risk and/or previous intake, including newly identified risk, if any: none    Session Content::  Wtr arrived at pt's s/o's residence ([place]). Wtr spoke with [name] briefly who stated that she had received her court decision which was 5 years probation, no time served. Wtr wished [name] a Happy Thanksgiving and [name] wished wtr the same. Wtr drove on to pt's mother's residence.    Wtr met with pt at his mother's residence on [address] was attentive and engaged. Pt requested [employer] gift cards for assistance to provide Thanksgiving dinner for his family. Wtr agreed to request from the ACT Team. Pt stated that he would be having dinner with his mother at her residence rather than at [name]'s due to anxiety of anticipated conflict between himself and [place] or [name]'s family members. Wtr provided support.     Addis stated that he had 12 days sober with a smile on his face. Wtr congratulated pt. Pt's Uncle walked in as wtr and pt were talking and pt told his Uncle, "Haven't I been doing good?" and pt's Uncle reported, "Oh, yes. He has been doing very well. No shakes, no drinking, 4/5 days now". Pt then stated, "4/5 days here, but before that at [name]'s" (referring to his pronounced 12 days sober). Wtr congratulated pt once again. Wtr asked pt what pt had been doing differently and he  expressed that he was more focused than he had been previously. Wtr asked if pt did "slip" if he would consider higher level of care for his CD treatment. Pt was agreeable and stated, "I'll do whatever it takes". Wtr reviewed with pt that slips or relapse is often a part of the recovery process but to not throw the towel in but continue attempting. Pt was  agreeable.    Wtr and pt said goodbyes and wtr left.    Visit Diagnosis:    Major psychotic depression, recurrent- Primary ICD-10-CM: F33.3  ICD-9-CM: 296.34       Interventions:  Continued to develop therapeutic relationship  Motivational Interviewing  Reviewed and discussed Treatment Plan/Treatment Plan Review    Current Treatment Plan   Created/Updated On 03/27/2017   Next Treatment Plan Due 09/27/2017         Plan:  Psychotherapy continues as described in care plan; plan remains the same. and Other planned interventions or recommendations: ACT Team will assist pt when appropriate in encouraging pt to seek appropriate level of care for CD treatment    NEXT APPT: 08/05/17, as per ACT calendar      Danise Edge, LMSW

## 2017-08-09 NOTE — Progress Notes (Signed)
Behavioral Health Progress Note     Length of Session: 45 minutes    Contact Type:  Location: Off Site    Face to Face     Problem(s)/Goals Addressed from Treatment Plan:  Treatment Problem #1 03/27/2017, continued from 02/21/2016   Patient Identified Problem Alc/drugs and instability at pt's s/o's and mother's residences       Treatment Goal #1 03/27/2017, continued from 02/21/2016   Patient Identified Goal "I will get my own apartment asap".      Progress toward goal(s): No change. Comment: Pt has acceptedbudgeting assistance on 07/03/17 and stated that on 07/17/17 he would like to give the ACT Team a security deposit of $400 to hold for him until he finds an apartment. Pt would have $300 remainder for monthly expenses. Yaser reported consistently moving back and forth between his mother's and [name]'s (s/o) residences due to conflict. Pt reported motivation to "take care of me" and to find his own apartment. Pt stated that he was looking at apartments on a list that he received for affordable housing from [facility].       1 a. Measurable Objectives : Delmont will work with ACT Team 3x a month to form a monthly budget, check-in with ongoing management of finances throughout month, and to note any adjustments that may need to be made after reviewing the month's income and overall expenses  Date established: 03/27/2017  Target date: 09/27/2017  Attained or Revised? new    1 b. Measurable Objectives :I will meet with ACT staff at least twice monthly to discuss and receive assistance in searching for safe and affordable housing, scheduling tours, making phone calls, completing applications, etc. As demonstrated by [name] having a safe and affordable place to live in the next 6 months.  Date established:09/02/2016 and continued on from this date 03/27/2017  Target date:  09/27/2017  Attained or Revised? Revised    1 c. Measurable Objectives : Romel will continue to utilize clinical appointments with the ACT Team to address communication and conflict with s/o, pt's mother, and other family members to assist pt in establishing healthy boundary lines and communication styles  Date established: 03/27/2017  Target date: 09/27/2017  Attained or Revised? new  ..........................................................................................................................................    Treatment Problem #2 03/27/2017   Patient Identified Problem poor physical and mental health       Treatment Goal #2 03/27/2017   Patient Identified Goal "I will get my health back in order (attending medical and clinical appointments, nutrition/weight)".     Progress toward goal(s): No change. Comment: Pt has had brief interludes of sobriety and attending clinical appointments but has continued to struggle to attend medical appointments and this continued avoidance has become apoint of excessive anxiety and stress for pt due to ongoing health concerns that are unaddressed.Pt has moved back to Phase II at [facility] for CD tx due to pt's increase in drinking.      2a. Measurable Objectives : I will successfully complete [place] [facility] Dependency treatment  Date established: 09/02/16  Target date: 09/27/2017  Attained or Revised? Continued    2b. Measurable Objectives : Nevan will discuss barriers to attending medical and clinical appointments and work with ACT Team to overcome these challenges to increase pt's compliance with treatment recommendation at least 1x a month.  Date established: 03/27/2017  Target date:  09/27/2017  Attained or Revised? new    2c. Measurable Objectives : Bradly will continue to explore and  discuss cognitive dissonance between continued use of drug/alc and maintaining sobriety and the effects of pt's alc/drug use on his physical and mental health.  Date established: 03/27/2017  Target date: 09/27/2017  Attained or Revised? new    Mental Status Exam:  APPEARANCE: Casual  ATTITUDE TOWARD INTERVIEWER: Cooperative  MOTOR ACTIVITY: WNL (within normal limits)  EYE CONTACT: Direct  SPEECH: Normal rate and tone  AFFECT: Anxious and Pleasant  MOOD: Anxious  THOUGHT PROCESS: Circumstantial and Goal directed  THOUGHT CONTENT: Negative Rumination  PERCEPTION: Within normal limits and No evidence of hallucinations  CURRENT SUICIDAL IDEATION: patient denies  CURRENT HOMICIDAL IDEATION: Patient denies  ORIENTATION: Alert and Oriented X 3.  CONCENTRATION: WNL  MEMORY:   Recent: intact   Remote: intact  COGNITIVE FUNCTION: Average intelligence  JUDGMENT: Intact  IMPULSE CONTROL: Poor  INSIGHT: Fair    Risk Assessment:  ASSESSMENT OF RISK FOR SUICIDAL BEHAVIOR  Changes in risk for suicide from baseline Formulation of Risk and/or previous intake, including newly identified risk, if any: none    Session Content::  Wtr arrived at pt's mother's residence and called pt's mother's landline. [name], Neoma Laming, answered and greeted wtr. Wtr asked if Shameek was home and she said she would check. Pt answered the phone and wtr greeted pt. Wtr and pt met in wtr's work Education officer, museum. Pt spoke of ongoing conflict at his mother's residence as well as at pt's s/o's residence [name]). Wtr provided support. Wtr offered skills for conflict resolution such as continuing the conversation once the argument has de-escalated, coming back to the issue at a later time. Pt received well. Wtr asked pt about his alcohol use and pt stated that he was  "doing my best" and attending his CD outpatient groups at [facility]. Wtr validated "best" attempts and asked if pt would be interested in a higher level of care for his CD and pt stated he would "do whatever it takes" but wanted to try cx outpatient at this time. Wtr reviewed availability of emergency on-call with pt if pt needed to discuss conflict and/or cravings for alc after-hours. Pt stated he had the emergency on-call number and thanked wtr. Wtr and pt said goodbyes and wtr left.     Visit Diagnosis:    Major psychotic depression, recurrent- Primary ICD-10-CM: F33.3  ICD-9-CM: 296.34       Interventions:  Continued to develop therapeutic relationship  Motivational Interviewing  Provided therapeutic support during discussion of distressing/traumatic events and/or symptoms  Reviewed and discussed Treatment Plan/Treatment Plan Review    Current Treatment Plan   Created/Updated On 03/27/2017   Next Treatment Plan Due 09/27/2017       Plan:  Psychotherapy continues as described in care plan; plan remains the same.    NEXT APPT: as per ACT calendar      Danise Edge, LMSW

## 2017-08-09 NOTE — Progress Notes (Addendum)
Behavioral Health Progress Note     Length of Session: 15 minutes    Contact Type:  Location: Off Site    Face to Face       Problem(s)/Goals Addressed from Treatment Plan:    Treatment Problem #1 03/27/2017, continued from 02/21/2016   Patient Identified Problem Alc/drugs and instability at pt's s/o's and mother's residences       Treatment Goal #1 03/27/2017, continued from 02/21/2016   Patient Identified Goal "I will get my own apartment asap".        Progress toward goal(s): N/A - New goal    1 a. Measurable Objectives : [name] will work with ACT Team 3x a month to form a monthly budget, check-in with ongoing management of finances throughout month, and to note any adjustments that may need to be made after reviewing the month's income and overall expenses  Date established: 03/27/2017  Target date: 09/27/2017  Attained or Revised? new    1 b. Measurable Objectives :I will meet with ACT staff at least twice monthly to discuss and receive assistance in searching for safe and affordable housing, scheduling tours, making phone calls, completing applications, etc. As demonstrated by Rayna Sextonalph having a safe and affordable place to live in the next 6 months.  Date established:09/02/2016 and continued on from this date 03/27/2017  Target date: 09/27/2017  Attained or Revised? Revised    1 c. Measurable Objectives : [name] will continue to utilize clinical appointments with the ACT Team to address communication and conflict with s/o, pt's mother, and other family members to assist pt in establishing healthy boundary lines and communication styles  Date established: 03/27/2017  Target date: 09/27/2017  Attained or Revised? New      Progress towards this goal: No change. Comment: Pt continues to live between his mother's and  s/o's residences despite ongoing conflict and safety concerns. Pt refused assistance to look for alternative housing or budgeting at this time stating that he will do so independently.      ..........................................................................................................................................    Treatment Problem #2 03/27/2017   Patient Identified Problem poor physical and mental health       Treatment Goal #2 03/27/2017   Patient Identified Goal "I will get my health back in order (attending medical and clinical appointments, nutrition/weight)".       Progress toward goal(s): No change. Comment: Pt has had brief interludes of sobriety and attending clinical appointments but has continued to struggle to attend medical appointments and this continued avoidance has become apoint of excessive anxiety and stress for pt due to ongoing health concerns that are unaddressed.Pt has continued to move towards his goal of successfully [facility]. [facility] as of this date 03/27/2017.       2a. Measurable Objectives : [name]  Date established: 09/02/16  Target date: 09/27/2017  Attained or Revised? Continued    2b. Measurable Objectives : [name] will discuss barriers to attending medical and clinical appointments and work with ACT Team to overcome these challenges to increase pt's compliance with treatment recommendation at least 1x a month.  Date established: 03/27/2017  Target date: 09/27/2017  Attained or Revised? new    2c. Measurable Objectives : [name] will continue to explore and discuss cognitive dissonance between continued use of drug/alc and maintaining sobriety and the effects of  pt's alc/drug use on his  physical and mental health.  Date established: 03/27/2017  Target date: 09/27/2017  Attained or Revised? new      Progress towards this goal: No change. Comment: Pt is reluctant to be honest about his drinking use with his probation officer despite awareness of the expectation. Pt states that [facility] CD [facility]. Wtr commends pt and encourages pt to practice honesty with probation officer as this could benefit his CD recovery.      Mental Status Exam:  APPEARANCE: Disheveled  ATTITUDE TOWARD INTERVIEWER: Cooperative  MOTOR ACTIVITY: Decreased  EYE CONTACT: Indirect  SPEECH: Slurred  AFFECT: Pleasant  MOOD: Depressed  THOUGHT PROCESS: Circumstantial  THOUGHT CONTENT: Negative Rumination  PERCEPTION: Within normal limits and No evidence of hallucinations  CURRENT SUICIDAL IDEATION: patient denies  CURRENT HOMICIDAL IDEATION: Patient denies  ORIENTATION: Alert and Oriented X 3.  CONCENTRATION: Fair  MEMORY:   Recent: intact   Remote: intact  COGNITIVE FUNCTION: Average intelligence  JUDGMENT: Impaired -  mild  IMPULSE CONTROL: Poor  INSIGHT: Poor    Risk Assessment:  ASSESSMENT OF RISK FOR SUICIDAL BEHAVIOR  Changes in risk for suicide from baseline Formulation of Risk and/or previous intake, including newly identified risk, if any: none    Session Content::  Wtr meets with pt at pt's mother's residence. Pt is disheveled and speech is slightly slurred. Wtr asks pt if he has been drinking and pt states that he drank late last night with his Uncle at his mother's residence. Wtr reviewed with pt importance to report this at probation as well as to notify his CD counselor so that pt's CD can be appropriately addressed. Pt states motivation to address but quickly turns to continuous conflict around him rather than addressing CD. Wtr reviews with pt that not being able to stop  drinking is not anyone's "fault" and was more about being "sick" and needing to get "well". Wtr reviewed with pt that in order to maintain medical appointments and attend CD group and probation appointments, as well as being an active father to his youngest daughter Angela CoxZeymanni  AKA "[name]", pt would need to attempt abstinence and ACT Team and many other resources were available to him in order to assist. Pt received well but remained fixated on ongoing conflict around him. Wtr provided validation and support. Wtr and pt said goodbyes and wtr left.     Visit Diagnosis:    Major psychotic depression, recurrent- Primary ICD-10-CM: F33.3  ICD-9-CM: 296.34       Interventions:  Continued to develop therapeutic relationship  Motivational Interviewing  Reviewed and discussed Treatment Plan/Treatment Plan Review  Supportive Psychotherapy    Current Treatment Plan   Created/Updated On 02/21/2016   Next Treatment Plan Due 08/22/2016         Plan:  Psychotherapy continues as described in care plan; plan remains the same.    NEXT APPT: as per ACT calendar      Alinda MoneyShannon Anthony Tamburo, LMSW

## 2017-08-11 ENCOUNTER — Ambulatory Visit: Payer: Medicaid Other
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
## 2017-08-14 ENCOUNTER — Other Ambulatory Visit: Payer: Self-pay

## 2017-08-14 ENCOUNTER — Other Ambulatory Visit: Payer: Medicaid Other | Admitting: Psychiatry

## 2017-08-14 ENCOUNTER — Encounter: Payer: Self-pay | Admitting: Psychiatry

## 2017-08-14 NOTE — Progress Notes (Signed)
[facility] MISSED/CANCELLED APPOINTMENT     Name: Derek NielsenRALPH D Walker  MRN: [id number]   DOB: 1976-08-10    Date of Scheduled Service: 08/14/2017     [name] did not answer the several phone calls that the writer made today.   When the writer' called the patient's mother residence, a family member answered the phone and stated that the patient to his medicatl appointment, but he should return home soon. Writer again called the patient as well as the phone number, but again the male individual stated that Derek Walker has not returned home from the appointment.     I have discussed today's appointment with my supervisor.    Additional Information:    Not applicable

## 2017-08-18 ENCOUNTER — Ambulatory Visit: Payer: Medicaid Other | Admitting: Infectious Diseases

## 2017-08-20 ENCOUNTER — Ambulatory Visit: Payer: Medicaid Other | Attending: Psychiatry

## 2017-08-20 ENCOUNTER — Other Ambulatory Visit: Payer: Self-pay | Admitting: Psychiatry

## 2017-08-20 DIAGNOSIS — F323 Major depressive disorder, single episode, severe with psychotic features: Secondary | ICD-10-CM | POA: Insufficient documentation

## 2017-08-20 DIAGNOSIS — F333 Major depressive disorder, recurrent, severe with psychotic symptoms: Secondary | ICD-10-CM | POA: Insufficient documentation
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
## 2017-08-22 ENCOUNTER — Ambulatory Visit: Payer: Medicaid Other
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
## 2017-08-25 ENCOUNTER — Ambulatory Visit: Payer: Medicaid Other

## 2017-08-25 NOTE — Progress Notes (Signed)
Behavioral Health Progress Note     Length of Session: 20 minutes    Contact Type:  Location: Off Site    Face to Face     Problem(s)/Goals Addressed from Treatment Plan:  Treatment Problem #1 03/27/2017, continued from 02/21/2016   Patient Identified Problem Alc/drugs and instability at pt's s/o's and mother's residences       Treatment Goal #1 03/27/2017, continued from 02/21/2016   Patient Identified Goal "I will get my own apartment asap".      Progress toward goal(s): No change. Comment: Pt has acceptedbudgeting assistance on 07/03/17 and stated that on 07/17/17 he would like to give the ACT Team a security deposit of $400 to hold for him until he finds an apartment. Pt would have $300 remainder for monthly expenses. Derek Walker reported consistently moving back and forth between his mother's and Derek Walker's (s/o) residences due to conflict. Pt reported motivation to "take care of me" and to find his own apartment. Pt stated that he was looking at apartments on a list that he received for affordable housing from [name].     On 07/28/17, pt reports that he has spent the majority of his money "paying dues" from "living between two households" at his mother's and Derek Walker's respective residences. Wtr reviews with pt that respite/crisis housing is available to him at [name]. Pt states that he is considering and remains in the contemplative stage of change.     On 07/30/17, pt stated motivation to save his money in December to budget for his own housing. Wtr provided MI and support.     Pt was arrested on 08/16/17. Wtr visited pt at [name] on this date, 08/20/17. Pt stated that he still had "a couple hundreds" (2 hundred dollar bills) left from his $750 check that [name] receives on the 1st of the month. Wtr reviewed budgeting plan with pt and asked pt what had happened. Pt was unable to clarify what happened with his money as he began drinking once he received his check (and continued drinking until  blacked out).     On 08/22/2017, pt stated that he still had $350 left on his debit card when he was arrested and when he was released from custody, there was only $5 left on his card. Pt stated that on 08/16/17 it was his nephew's birthday and he told his nephew to order some pizza and wings to celebrate and gave his nephew his card number, but not his PIN #. Wtr reviewed with pt to not give his card number to anyone and to re-order a debit card with new account number.       1 a. Measurable Objectives : Derek Walker will work with ACT Team 3x a month to form a monthly budget, check-in with ongoing management of finances throughout month, and to note any adjustments that may need to be made after reviewing the month's income and overall expenses  Date established: 03/27/2017  Target date: 09/27/2017  Attained or Revised? new    1 b. Measurable Objectives :I will meet with ACT staff at least twice monthly to discuss and receive assistance in searching for safe and affordable housing, scheduling tours, making phone calls, completing applications, etc. As demonstrated by Derek Walker having a safe and affordable place to live in the next 6 months.  Date established:09/02/2016 and continued on from this date 03/27/2017  Target date: 09/27/2017  Attained or Revised? Revised    1 c. Measurable Objectives : Derek Walker will continue  to utilize clinical appointments with the ACT Team to address communication and conflict with s/o, pt's mother, and other family members to assist pt in establishing healthy boundary lines and communication styles  Date established: 03/27/2017  Target date: 09/27/2017  Attained or Revised?  new  ..........................................................................................................................................    Treatment Problem #2 03/27/2017   Patient Identified Problem poor physical and mental health       Treatment Goal #2 03/27/2017   Patient Identified Goal "I will get my health back in order (attending medical and clinical appointments, nutrition/weight)".     Progress toward goal(s): No change. Comment: Pt has had brief interludes of sobriety and attending clinical appointments but has continued to struggle to attend medical appointments and this continued avoidance has become apoint of excessive anxiety and stress for pt due to ongoing health concerns that are unaddressed.Pt has moved back to [facility] at [name] for CD tx due to pt's increase in drinking.     On 07/24/17, pt has considered checking into detox/inpatient rehab for CD treatment. Pt has not yet made a decision and continues to explore cognitive dissonance between active alcoholism and recovery.     On 07/28/17, pt continued to explore CD treatment options. Pt refused going to detox/inpatient at this time due to pt's s/o's pending court date 07/31/17-- it will be decided if pt's s/o is serving 1 year in [facility] or receiving 5 years probation and serve weekends in jail-- this will alter pt's plan as he could possibly become primary caretaker for daughter "Derek" Chyrl Walker, 52 y/o).     On 08/05/17, pt appeared to be mildly intoxicated from the previous evening. Wtr did not address this time and ACT Team will address when meeting with pt on next visit as long as pt is sober.    On 08/20/17, wtr reviewed importance of pt addressing CD in order to attend medical appointments as scheduled. Pt stated little motivation to address CD through inpatient treatment and stated that he would prefer serving his time in jail, stating his logic: "I'm already here". Wtr challenged pt on this logic  and pt was reluctant and cyclically returned to this reasoning but did respond well to MI and stated he would "pray on it".    On 08/22/17, wtr reviewed with pt that he has a MIPS appointment on 08/27/2017. Wtr asked if pt was intoxicated as pt appeared to be so, slurring his words, unable to maintain balance, and stumbling as he walked. Pt denied intoxication and stated that he had "2 beers" the previous night after being released "late at night", approximately 10pm.     On this date 08/25/17, wtr reviewed with pt that he has a MIPS appointment on 08/27/17 and the ACT Team would pick up pt at 1pm and transport him to the appointment. Pt stated that he would be ready to picked up at his mother's residence to do so.       2a. Measurable Objectives : I will successfully complete [facility]  Date established: 09/02/16  Target date: 09/27/2017  Attained or Revised? Continued    2b. Measurable Objectives : Derek Walker will discuss barriers to attending medical and clinical appointments and work with ACT Team to overcome these challenges to increase pt's compliance with treatment recommendation at least 1x a month.  Date established: 03/27/2017  Target date: 09/27/2017  Attained or Revised? new    2c. Measurable Objectives : Derek Walker will continue to explore  and discuss cognitive dissonance between continued use of drug/alc and maintaining sobriety and the effects of pt's alc/drug use on his physical and mental health.  Date established: 03/27/2017  Target date: 09/27/2017  Attained or Revised? new      Mental Status Exam:  APPEARANCE: Casual, Disheveled  ATTITUDE TOWARD INTERVIEWER: Cooperative  MOTOR ACTIVITY: WNL (within normal limits)  EYE CONTACT: Direct  SPEECH: Normal rate and  tone and seemed slightly slurred but difficult to determine if from pt just waking up or from previous night's alc use  AFFECT: Sad and Pleasant  MOOD: Normal and Sad  THOUGHT PROCESS: Circumstantial  THOUGHT CONTENT: No unusual themes  PERCEPTION: No evidence of hallucinations  CURRENT SUICIDAL IDEATION: patient denies  CURRENT HOMICIDAL IDEATION: Patient denies  ORIENTATION: Alert and Oriented X 3.  CONCENTRATION: WNL  MEMORY:   Recent: intact   Remote: intact  COGNITIVE FUNCTION: Average intelligence  JUDGMENT: Intact  IMPULSE CONTROL: Fair  INSIGHT: Fair    Risk Assessment:  ASSESSMENT OF RISK FOR SUICIDAL BEHAVIOR  Changes in risk for suicide from baseline Formulation of Risk and/or previous intake, including newly identified risk, if any: none    Session Content::  Wtr arrived at pt's s/o Derek Walker's residence on [address] and knocked. A man answered the door that wtr did not recognize and stated that "Derek Walker" (pt's middle name that friends and family refer to pt as) was sleeping. Wtr kindly asked for man to let pt know that wtr needed to see pt to deliver medications and needed to deliver them directly to him. Wtr waited on the front porch. Several minutes later, pt answered the door and welcomed wtr into the residence. Pt sat in a chair at a desk in the front room while wtr stood. Pt appeared disheveled and stated that he had just woken up and received a phone call from his mother stating that her sister, pt's Aunt, had passed away. Pt's Aunt had been sick and had declining health as pt had reported previously. Wtr gave condolences to pt and his family. Wtr delivered medications to pt.     Wtr asked if pt had yet gone to probation as he stated he would do so on 08/22/17. Pt stated that he had not yet "made it to probation", and was not sure when he would make it, as he was going to shower and "head over to my mom's, Derek Walker knows she is going to be in all types of pain over this". Wtr again gave condolences to  pt and his family. Wtr reviewed with pt that he has a MIPS appointment on 08/27/17 and ACT Team would transport pt and pick him up at 1pm. Pt stated that he would be at his mother's residence and would be ready for ACT Team to transport him. Wtr again gave condolences to pt and his family. Wtr and pt said goodbyes and wtr left.     Visit Diagnosis:    Major psychotic depression, recurrent- Primary ICD-10-CM: F33.3  ICD-9-CM: 296.34       Interventions:  Continued to develop therapeutic relationship  Provided therapeutic support during discussion of distressing/traumatic events and/or symptoms  Supportive Psychotherapy   Delivered medications to pt per pt's current medication list    Current Treatment Plan   Created/Updated On 03/27/2017   Next Treatment Plan Due 09/27/2017       Plan:  Psychotherapy continues as described in care plan; plan remains the same. and Other planned interventions or recommendations: ACT Team  will cx to coordinate with pt's probation re: pt's CD tx    NEXT APPT: 08/27/17, as per ACT calendar      Alinda MoneyShannon Maurie Olesen, LMSW

## 2017-08-27 ENCOUNTER — Ambulatory Visit: Payer: Medicaid Other

## 2017-08-27 ENCOUNTER — Ambulatory Visit: Payer: Medicaid Other | Admitting: Psychiatry

## 2017-08-28 ENCOUNTER — Ambulatory Visit: Payer: Medicaid Other

## 2017-08-28 NOTE — Progress Notes (Signed)
Behavioral Health Progress Note     Length of Session: 15 minutes    Contact Type:  Location: Off Site    Face to Face     Problem(s)/Goals Addressed from Treatment Plan:  Treatment Problem #1 03/27/2017, continued from 02/21/2016   Patient Identified Problem Alc/drugs and instability at pt's s/o's and mother's residences       Treatment Goal #1 03/27/2017, continued from 02/21/2016   Patient Identified Goal "I will get my own apartment asap".      Progress toward goal(s): No change. Comment: Pt has acceptedbudgeting assistance on 07/03/17 and stated that on 07/17/17 he would like to give the ACT Team a security deposit of $400 to hold for him until he finds an apartment. Pt would have $300 remainder for monthly expenses. Bodie reported consistently moving back and forth between his mother's and [name]'s (s/o) residences due to conflict. Pt reported motivation to "take care of me" and to find his own apartment. Pt stated that he was looking at apartments on a list that he received for affordable housing from [facility].     On 07/28/17, pt reports that he has spent the majority of his money "paying dues" from "living between two households" at his mother's and [name]'s respective residences. Wtr reviews with pt that respite/crisis housing is available to him at [facility]. Pt states that he is considering and remains in the contemplative stage of change.     On 07/30/17, pt stated motivation to save his money in December to budget for his own housing. Wtr provided MI and support.     Pt was arrested on 08/16/17. Wtr visited pt at [facility] on this date, 08/20/17. Pt stated that he still had "a couple hundreds" (2 hundred dollar bills) left from his $750 check that Jaxson receives on the 1st of the month. Wtr reviewed budgeting plan with pt and asked pt what had happened. Pt was unable to clarify what happened with his money as he began drinking once he received his check (and continued drinking until  blacked out).     On 08/22/2017, pt stated that he still had $350 left on his debit card when he was arrested and when he was released from custody, there was only $5 left on his card. Pt stated that on 08/16/17 it was his nephew's birthday and he told his nephew to order some pizza and wings to celebrate and gave his nephew his card number, but not his PIN #. Wtr reviewed with pt to not give his card number to anyone and to re-order a debit card with new account number.       1 a. Measurable Objectives : Nickson will work with ACT Team 3x a month to form a monthly budget, check-in with ongoing management of finances throughout month, and to note any adjustments that may need to be made after reviewing the month's income and overall expenses  Date established: 03/27/2017  Target date: 09/27/2017  Attained or Revised? new    1 b. Measurable Objectives :I will meet with ACT staff at least twice monthly to discuss and receive assistance in searching for safe and affordable housing, scheduling tours, making phone calls, completing applications, etc. As demonstrated by [name] having a safe and affordable place to live in the next 6 months.  Date established:09/02/2016 and continued on from this date 03/27/2017  Target date: 09/27/2017  Attained or Revised? Revised    1 c. Measurable Objectives : Alontae will continue  to utilize clinical appointments with the ACT Team to address communication and conflict with s/o, pt's mother, and other family members to assist pt in establishing healthy boundary lines and communication styles  Date established: 03/27/2017  Target date: 09/27/2017  Attained or Revised?  new  ..........................................................................................................................................    Treatment Problem #2 03/27/2017   Patient Identified Problem poor physical and mental health       Treatment Goal #2 03/27/2017   Patient Identified Goal "I will get my health back in order (attending medical and clinical appointments, nutrition/weight)".     Progress toward goal(s): No change. Comment: Pt has had brief interludes of sobriety and attending clinical appointments but has continued to struggle to attend medical appointments and this continued avoidance has become apoint of excessive anxiety and stress for pt due to ongoing health concerns that are unaddressed.Pt has moved back to [facility] at [facility] for CD tx due to pt's increase in drinking.     On 07/24/17, pt has considered checking into detox/inpatient rehab for CD treatment. Pt has not yet made a decision and continues to explore cognitive dissonance between active alcoholism and recovery.     On 07/28/17, pt continued to explore CD treatment options. Pt refused going to detox/inpatient at this time due to pt's s/o's pending court date 07/31/17-- it will be decided if pt's s/o is serving 1 year in [place] or receiving 5 years probation and serve weekends in jail-- this will alter pt's plan as he could possibly become primary caretaker for daughter "[name]" Cleda Mccreedy, 41 y/o).     On 08/05/17, pt appeared to be mildly intoxicated from the previous evening. Wtr did not address this time and ACT Team will address when meeting with pt on next visit as long as pt is sober.    On 08/20/17, wtr reviewed importance of pt addressing CD in order to attend medical appointments as scheduled. Pt stated little motivation to address CD through inpatient treatment and stated that he would prefer serving his time in jail, stating his logic: "I'm already here". Wtr challenged pt on this logic  and pt was reluctant and cyclically returned to this reasoning but did respond well to MI and stated he would "pray on it".    On 08/22/17, wtr reviewed with pt that he has a MIPS appointment on 08/27/2017. Wtr asked if pt was intoxicated as pt appeared to be so, slurring his words, unable to maintain balance, and stumbling as he walked. Pt denied intoxication and stated that he had "2 beers" the previous night after being released "late at night", approximately 10pm.     On 08/25/17, wtr reviewed with pt that he has a MIPS appointment on 08/27/17 and the ACT Team would pick up pt at 1pm and transport him to the appointment. Pt stated that he would be ready to picked up at his mother's residence to do so.       2a. Measurable Objectives : I will successfully complete [place] [facility] treatment  Date established: 09/02/16  Target date: 09/27/2017  Attained or Revised? Continued    2b. Measurable Objectives : Rakeen will discuss barriers to attending medical and clinical appointments and work with ACT Team to overcome these challenges to increase pt's compliance with treatment recommendation at least 1x a month.  Date established: 03/27/2017  Target date: 09/27/2017  Attained or Revised? new    2c. Measurable Objectives : Starsky will continue to explore and discuss  cognitive dissonance between continued use of drug/alc and maintaining sobriety and the effects of pt's alc/drug use on his physical and mental health.  Date established: 03/27/2017  Target date: 09/27/2017  Attained or Revised? new      Mental Status Exam:  APPEARANCE: Casual  ATTITUDE TOWARD INTERVIEWER: Cooperative  MOTOR ACTIVITY: WNL (within normal limits)  EYE CONTACT: Direct  SPEECH: Normal rate and tone and  Slurred  AFFECT: Sad, Anxious and Pleasant  MOOD: Anxious and Sad  THOUGHT PROCESS: Circumstantial  THOUGHT CONTENT: No unusual themes  PERCEPTION: No evidence of hallucinations  CURRENT SUICIDAL IDEATION: patient denies  CURRENT HOMICIDAL IDEATION: Patient denies  ORIENTATION: Alert and Oriented X 3.  CONCENTRATION: WNL  MEMORY:   Recent: intact   Remote: intact  COGNITIVE FUNCTION: Average intelligence  JUDGMENT: Impaired -  minimal  IMPULSE CONTROL: Poor  INSIGHT: Poor    Risk Assessment:  ASSESSMENT OF RISK FOR SUICIDAL BEHAVIOR  Changes in risk for suicide from baseline Formulation of Risk and/or previous intake, including newly identified risk, if any: none    Session Content::  Wtr arrived at pt's mother's residence and called pt to announce arrival. Wtr met with pt in wtr's vehicle. Pt was well-groomed and well-dressed and stated that he was about to help his mother finish funeral arrangements for his Aunt that recently passed away. Pt stated he was sad about his Aunt passing but knew that she had been sick. Pt stated that his Aunt had pancreatic cancer as well as liver cirrhosis. Pt stated, "The doctors as much as told her to go ahead and have her last drink". Wtr asked pt if his Aunt had passed away due to cirrhosis and pt stated that he thought so. Wtr stated condolences for pt. Wtr asked if pt had been drinking as he was well-groomed but had a slight sway and minimal slur in his speech. Pt stated that he had drank the night before. Wtr wished pt well as he was about to leave the house with his mother to complete funeral arrangements and asked if pt had yet gone to probation. Pt stated that he had seen his [place] and pt had explained that alleged assault charges had been dropped but maintained a no-contact order with the woman who had made the charges against pt. Pt stated that his PO had accepted this but was still planning on having pt appear in front of his sentencing Marveen Reeks to determine best  course of action. Wtr and pt said goodbyes and wtr left.     Visit Diagnosis:    Major psychotic depression, recurrent- Primary ICD-10-CM: F33.3  ICD-9-CM: 296.34         Interventions:  Continued to develop therapeutic relationship  Provided therapeutic support during discussion of distressing/traumatic events and/or symptoms    Current Treatment Plan   Created/Updated On 03/27/2017   Next Treatment Plan Due 09/27/2017       Plan:  Other planned interventions or recommendations: Continue to align with probation to coordinate CD tx for pt    NEXT APPT: as per ACT calendar      Danise Edge, LMSW

## 2017-09-01 ENCOUNTER — Ambulatory Visit: Payer: Medicaid Other | Admitting: Psychiatry

## 2017-09-02 ENCOUNTER — Ambulatory Visit: Payer: Medicaid Other

## 2017-09-02 NOTE — Progress Notes (Signed)
[facility] ACT Team Clinical Management Note     Wtr met with pt at his s/o's residence, [place], on [address]. Pt reported having conflict going to his mother's residence because his cousins were usually present recently and are "trouble makers" or "knuckle heads". Wtr provided validation and support to pt. Wtr asked pt if he were able to speak to his mother regarding his cousins and pt stated that his mother was "too nice" and would likely not ask them to leave or spend less time at her residence. Wtr again provided validation and support to pt. Wtr cx treatment planning with pt regarding CD treatment. Wtr and pt said goodbyes and wtr left.    Danise Edge, LMSW

## 2017-09-02 NOTE — Progress Notes (Signed)
Behavioral Health Progress Note     LENGTH OF SESSION: 15 minutes    Contact Type:  Location: Off Site    Face to Face     Problem(s)/Goals Addressed from Treatment Plan:    Treatment Problem #1 03/27/2017, continued from 02/21/2016   Patient Identified Problem Alc/drugs and instability at pt's s/o's and mother's residences       Treatment Goal #1 03/27/2017, continued from 02/21/2016   Patient Identified Goal "I will get my own apartment asap".        Progress toward goal(s): N/A - New goal    1 a. Measurable Objectives : Derek Walker will work with ACT Team 3x a month to form a monthly budget, check-in with ongoing management of finances throughout month, and to note any adjustments that may need to be made after reviewing the month's income and overall expenses  Date established: 03/27/2017  Target date: 09/27/2017  Attained or Revised? new    1 b. Measurable Objectives :I will meet with ACT staff at least twice monthly to discuss and receive assistance in searching for safe and affordable housing, scheduling tours, making phone calls, completing applications, etc. As demonstrated by Derek Walker having a safe and affordable place to live in the next 6 months.  Date established:09/02/2016 and continued on from this date 03/27/2017  Target date: 09/27/2017  Attained or Revised? Revised    1 c. Measurable Objectives : Derek Walker will continue to utilize clinical appointments with the ACT Team to address communication and conflict with s/o, pt's mother, and other family members to assist pt in establishing healthy boundary lines and communication styles  Date established: 03/27/2017  Target date: 09/27/2017  Attained or Revised? New      Progress towards this goal: No change. Comment: Pt continues to live between his mother's and  s/o's residences despite ongoing conflict and safety concerns. Pt refused assistance to look for alternative housing or budgeting at this time stating that he will do so independently.      ..........................................................................................................................................    Treatment Problem #2 03/27/2017   Patient Identified Problem poor physical and mental health       Treatment Goal #2 03/27/2017   Patient Identified Goal "I will get my health back in order (attending medical and clinical appointments, nutrition/weight)".       Progress toward goal(s): No change. Comment: Pt has had brief interludes of sobriety and attending clinical appointments but has continued to struggle to attend medical appointments and this continued avoidance has become apoint of excessive anxiety and stress for pt due to ongoing health concerns that are unaddressed.Pt has continued to move towards his goal of successfully completing outpatient CD treatment at [facility]. Pt is in Phase II of treatment at [facility] as of this date 03/27/2017.       2a. Measurable Objectives : I will successfully complete [place] Chemical Dependency treatment  Date established: 09/02/16  Target date: 09/27/2017  Attained or Revised? Continued    2b. Measurable Objectives : Derek Walker will discuss barriers to attending medical and clinical appointments and work with ACT Team to overcome these challenges to increase pt's compliance with treatment recommendation at least 1x a month.  Date established: 03/27/2017  Target date: 09/27/2017  Attained or Revised? new    2c. Measurable Objectives : Derek Walker will continue to explore and discuss cognitive dissonance between continued use of drug/alc and maintaining sobriety and the effects of  pt's alc/drug use on his  physical and mental health.  Date established: 03/27/2017  Target date: 09/27/2017  Attained or Revised? new      Progress towards this goal: Wtr met with pt to discuss upcoming appointments    Mental Status Exam:  APPEARANCE: Well-groomed, Casual  ATTITUDE TOWARD INTERVIEWER: Cooperative  MOTOR ACTIVITY: WNL (within normal limits)  EYE CONTACT: Indirect  SPEECH: Normal rate and tone  AFFECT: Anxious and Pleasant  MOOD: Anxious and Depressed  THOUGHT PROCESS: Circumstantial and Goal directed  THOUGHT CONTENT: Negative Rumination  PERCEPTION: No evidence of hallucinations  CURRENT SUICIDAL IDEATION: patient denies  CURRENT HOMICIDAL IDEATION: Patient denies  ORIENTATION: Alert and Oriented X 3.  CONCENTRATION: WNL  MEMORY:              Recent: intact              Remote: intact  COGNITIVE FUNCTION: Average intelligence  JUDGMENT: Impaired -  mild  IMPULSE CONTROL: Fair  INSIGHT: Fair    Risk Assessment:  ASSESSMENT OF RISK FOR SUICIDAL BEHAVIOR  Changes in risk for suicide from baseline Formulation of Risk and/or previous intake, including newly identified risk, if any: none    Session Content::  Wtr arrived at pt's mother's residence and pt met with pt in wtr's work vehicle. Pt stated no safety concerns at this time. Wtr reviewed pt schedule for the week and agreed to transport pt to probation on 05/06/17. Wtr and pt say goodbyes and wtr leaves.     Visit Diagnosis:    Major psychotic depression, recurrent- Primary ICD-10-CM: F33.3  ICD-9-CM: 296.34       Interventions:  Continued to develop therapeutic relationship  Supportive Psychotherapy  Wtr transported pt to complete labs as well as attended probation with pt.     Current Treatment Plan   Created/Updated On 03/27/2017   Next Treatment Plan Due 09/27/2017       Plan:  Psychotherapy continues as described in care plan; plan remains the same. and Other planned  interventions or recommendations: Pt has avoided appointments and treatment for pt's medical concerns and is more willing to work with the ACT Team to address pt's health when paired with incentives such as an [employer] gift card as pt has reported not having enough food.     NEXT APPT: 05/02/2017, as per ACT calendar      Danise Edge, LMSW

## 2017-09-02 NOTE — Progress Notes (Signed)
Behavioral Health Progress Note     LENGTH OF SESSION: 24mnutes    Contact Type:  Location: Off Site    Face to Face    Problem(s)/Goals Addressed from Treatment Plan:    Treatment Problem #1 03/27/2017, continued from 02/21/2016   Patient Identified Problem Alc/drugs and instability at pt's s/o's and mother's residences       Treatment Goal #1 03/27/2017, continued from 02/21/2016   Patient Identified Goal "I will get my own apartment asap".        Progress toward goal(s): N/A - New goal    1 a. Measurable Objectives : RGeoffrywill work with ACT Team 3x a month to form a monthly budget, check-in with ongoing management of finances throughout month, and to note any adjustments that may need to be made after reviewing the month's income and overall expenses  Date established: 03/27/2017  Target date: 09/27/2017  Attained or Revised? new    1 b. Measurable Objectives :I will meet with ACT staff at least twice monthly to discuss and receive assistance in searching for safe and affordable housing, scheduling tours, making phone calls, completing applications, etc. As demonstrated by RDeidre Alahaving a safe and affordable place to live in the next 6 months.  Date established:09/02/2016 and continued on from this date 03/27/2017  Target date: 09/27/2017  Attained or Revised? Revised    1 c. Measurable Objectives : RLarsonwill continue to utilize clinical appointments with the ACT Team to address communication and conflict with s/o, pt's mother, and other family members to assist pt in establishing healthy boundary lines and communication styles  Date established: 03/27/2017  Target date: 09/27/2017  Attained or Revised? New      Progress towards this goal: No change. Comment: Pt continues to live between his mother's and  s/o's residences despite ongoing conflict and safety concerns. Pt refused assistance to look for alternative housing or budgeting at this time stating that he will do so independently.      ..........................................................................................................................................    Treatment Problem #2 03/27/2017   Patient Identified Problem poor physical and mental health       Treatment Goal #2 03/27/2017   Patient Identified Goal "I will get my health back in order (attending medical and clinical appointments, nutrition/weight)".       Progress toward goal(s): No change. Comment: Pt has had brief interludes of sobriety and attending clinical appointments but has continued to struggle to attend medical appointments and this continued avoidance has become apoint of excessive anxiety and stress for pt due to ongoing health concerns that are unaddressed.Pt has continued to move towards [facility]       2a. Measurable Objectives : I will successfully complete [facility]  Date established: 09/02/16  Target date: 09/27/2017  Attained or Revised? Continued    2b. Measurable Objectives : RManiwill discuss barriers to attending medical and clinical appointments and work with ACT Team to overcome these challenges to increase pt's compliance with treatment recommendation at least 1x a month.  Date established: 03/27/2017  Target date: 09/27/2017  Attained or Revised? new    2c. Measurable Objectives : RCaswellwill continue to explore and discuss cognitive dissonance between continued use of drug/alc and maintaining sobriety and the effects of  pt's alc/drug use on his physical and  mental health.  Date established: 03/27/2017  Target date: 09/27/2017  Attained or Revised? new      Progress towards this goal: Wtr met with pt to discuss upcoming appointments    Mental Status Exam:  APPEARANCE: Well-groomed, Casual  ATTITUDE TOWARD INTERVIEWER: Cooperative  MOTOR ACTIVITY: WNL (within normal limits)  EYE CONTACT: Indirect  SPEECH: Normal rate and tone  AFFECT: Anxious and Pleasant  MOOD: Anxious and Depressed  THOUGHT PROCESS: Circumstantial and Goal directed  THOUGHT CONTENT: Negative Rumination  PERCEPTION: No evidence of hallucinations  CURRENT SUICIDAL IDEATION: patient denies  CURRENT HOMICIDAL IDEATION: Patient denies  ORIENTATION: Alert and Oriented X 3.  CONCENTRATION: WNL  MEMORY:  Recent: intact  Remote: intact  COGNITIVE FUNCTION: Average intelligence  JUDGMENT: Impaired - mild  IMPULSE CONTROL: Fair  INSIGHT: Fair    Risk Assessment:  ASSESSMENT OF RISK FOR SUICIDAL BEHAVIOR  Changes in risk for suicide from baseline Formulation of Risk and/or previous intake, including newly identified risk, if any: none    Session Content:: Wtr arrived at pt's mother's residence and pt met with pt in wtr's work vehicle. Pt stated no safety concerns at this time. Wtr reviewed pt schedule for the week and agreed to transport pt to probation on 05/06/17. Pt stated that he had drank a few beers and wtr encouraged pt to report to his probation officer and to CD treatment at [facility] provided MI to pt, encouraging pt to participate actively in CD treatment. Pt agreed but returned to stating circumstances that were distressing such as ongoing conflict with pt's s/o [name]. Wtr provided support and continued MI.  Wtr and pt say goodbyes and wtr leaves.     Visit Diagnosis:  Major psychotic depression, recurrent- Primary ICD-10-CM: F33.3  ICD-9-CM: 296.34        Interventions:  Continued to develop therapeutic relationship  Supportive Psychotherapy  Wtr transported pt to complete labs as well as attended probation with pt.     Current Treatment Plan   Created/Updated On 03/27/2017   Next Treatment Plan Due 09/27/2017       Plan:  Psychotherapy continues as described in care plan; plan remains the same. and Other planned interventions or recommendations: Pt has avoided appointments and treatment for pt's medical concerns and is more willing to work with the ACT Team to address pt's health when paired with incentives such as an [employer] gift card as pt has reported not having enough food.     NEXT APPT: as per ACT calendar      Danise Edge, LMSW

## 2017-09-02 NOTE — Progress Notes (Signed)
[facility] ACT Team Clinical Management Note     Wtr met with pt at his mother's residence. Pt stated continued conflict with both his mother, cousins, and his s/o and her children. Wtr asked if pt would be interested in crisis housing for respite through [facility]. Pt stated interested. Wtr and pt completed referral. Pt stated that he changed his mind and did not want to go at this time because he didn't have any of his laundry done. Wtr reluctantly accepted after challenging pt to move onto resolution with no progress. Wtr reviewed safety plan with pt. Pt stated no safety concerns at this time. Wtr and pt said goodbyes and wtr left.     Danise Edge, LMSW

## 2017-09-02 NOTE — Progress Notes (Signed)
[facility] MISSED/CANCELLED APPOINTMENT     Name: Derek NielsenRALPH D Walker  MRN: [id number]   DOB: [birth year]    Date of Scheduled Service: 09/02/17    [name] was a no show for today's appointment.  I have discussed today's appointment with my supervisor.    Additional Information:    [name] had a MIPS appointment but was out when [name] came to pick him up.

## 2017-09-02 NOTE — Progress Notes (Signed)
[facility] Team Clinical Management Note     Wtr met with pt briefly at his mother's residence. Pt stated continued familial conflict. Wtr provided support and validation to pt and reviewed safety plan with pt once again. Pt stated no safety concerns at this time. Wtr and pt said goodbyes and wtr left.     Danise Edge, LMSW
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
## 2017-09-04 ENCOUNTER — Ambulatory Visit: Payer: Medicaid Other

## 2017-09-04 ENCOUNTER — Ambulatory Visit: Payer: Medicaid Other | Admitting: Infectious Diseases

## 2017-09-04 NOTE — Progress Notes (Signed)
[facility] MISSED/CANCELLED APPOINTMENT     Name: Derek NielsenRALPH D Walker  MRN: [id number]   DOB: 01/22/1976    Date of Scheduled Service: 09/04/2017    Mr. Holzschuh was a no show for today's appointment.  I have discussed today's appointment with my supervisor.    Additional Information:    Writer called patient's family twice and he was not around.

## 2017-09-05 ENCOUNTER — Ambulatory Visit: Payer: Medicaid Other

## 2017-09-05 NOTE — Progress Notes (Signed)
[facility] PSYCHIATRIC CASE MANAGER NOTE    Patient Name: Derek Walker  EHM:[id number]  Encounter Date: 09/05/2017    Duration of Contact: 15    Contact Type:  Location: Off Site    Face to Face     Primary Therapist: Danise Edge     Reason for Referral:  (1) Wellness check  (2) Deliver prescribed medications   (3) Introduce self as new Case Manager.    Goal addressed during session:   (1) This Cm was able to observe and ask about both Physical and Mental Health status.  ( calm, positive interaction).  (2) This CM was able to deliver and hand of over prescribed mediations.  (3) This CM was able to introduce self and tell what I do for the Act program.     Session Content:  CM Eber Hong met Derek Walker for the first time at his home.   He displayed positive upbeat personality.   I asked how he was doing for Xmas and he stated that he was trying. This CM provided him with his prescribed mediations, which he took out of the bag and checked over for accuracy. This CM described what I do as a Case Manager for the Act team and provided him with my card if he every needed assistance. He stated that he needed none at this time.    Plan/Progress Toward Achieving Referral Needs:  CM will continue to provide wellness checks and assist as needed.   This CM will continue to deliver medications as needed.   This CM will encourage Derek Walker to utilize case management services for assistance with what ever he needs.    Plan:  Continue Treatment as planned.

## 2017-09-12 ENCOUNTER — Ambulatory Visit: Payer: Medicaid Other

## 2017-09-12 ENCOUNTER — Ambulatory Visit: Payer: Medicaid Other | Admitting: Psychiatry
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
## 2017-09-17 ENCOUNTER — Ambulatory Visit: Payer: Medicaid Other | Attending: Psychiatry

## 2017-09-17 DIAGNOSIS — F333 Major depressive disorder, recurrent, severe with psychotic symptoms: Secondary | ICD-10-CM | POA: Insufficient documentation

## 2017-09-17 DIAGNOSIS — F323 Major depressive disorder, single episode, severe with psychotic features: Secondary | ICD-10-CM | POA: Insufficient documentation

## 2017-09-18 ENCOUNTER — Ambulatory Visit: Payer: Medicaid Other
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
## 2017-09-19 ENCOUNTER — Ambulatory Visit: Payer: Medicaid Other | Attending: Infectious Diseases | Admitting: Infectious Diseases

## 2017-09-19 ENCOUNTER — Encounter: Payer: Self-pay | Admitting: Infectious Diseases

## 2017-09-19 ENCOUNTER — Ambulatory Visit: Payer: Medicaid Other

## 2017-09-19 VITALS — BP 120/82 | HR 101 | Temp 99.1°F | Resp 16 | Ht 71.0 in | Wt 161.0 lb

## 2017-09-19 DIAGNOSIS — K219 Gastro-esophageal reflux disease without esophagitis: Secondary | ICD-10-CM | POA: Insufficient documentation

## 2017-09-19 DIAGNOSIS — B20 Human immunodeficiency virus [HIV] disease: Secondary | ICD-10-CM | POA: Insufficient documentation

## 2017-09-19 DIAGNOSIS — Z79899 Other long term (current) drug therapy: Secondary | ICD-10-CM | POA: Insufficient documentation

## 2017-09-19 DIAGNOSIS — Z23 Encounter for immunization: Secondary | ICD-10-CM | POA: Insufficient documentation

## 2017-09-19 DIAGNOSIS — F1020 Alcohol dependence, uncomplicated: Secondary | ICD-10-CM

## 2017-09-19 LAB — COMPREHENSIVE METABOLIC PANEL
ALT: 83 U/L — ABNORMAL HIGH (ref 0–50)
AST: 162 U/L — ABNORMAL HIGH (ref 0–50)
Albumin: 4.7 g/dL (ref 3.5–5.2)
Alk Phos: 87 U/L (ref 40–130)
Anion Gap: 17 — ABNORMAL HIGH (ref 7–16)
Bilirubin,Total: 0.8 mg/dL (ref 0.0–1.2)
CO2: 27 mmol/L (ref 20–28)
Calcium: 9.2 mg/dL (ref 9.0–10.3)
Chloride: 97 mmol/L (ref 96–108)
Creatinine: 0.77 mg/dL (ref 0.67–1.17)
GFR,Black: 130 *
GFR,Caucasian: 112 *
Glucose: 93 mg/dL (ref 60–99)
Lab: 6 mg/dL (ref 6–20)
Potassium: 3.7 mmol/L (ref 3.3–5.1)
Sodium: 141 mmol/L (ref 133–145)
Total Protein: 7.7 g/dL (ref 6.3–7.7)

## 2017-09-19 LAB — LYMPHOCYTE SUBSET (T CELLS ONLY)
CD4#: 307 cells/uL — ABNORMAL LOW (ref 496–2186)
CD4%: 22 % — ABNORMAL LOW (ref 32–71)
CD4/CD8: 0.5 — ABNORMAL LOW (ref 0.7–3.0)
CD8#: 571 cells/uL (ref 177–1137)
T LYM #(CD3): 945 cells/uL (ref 754–2810)
T LYM %(CD3): 69 % (ref 54–87)
T Suppress %(CD8): 41 % — ABNORMAL HIGH (ref 10–38)

## 2017-09-19 LAB — CBC AND DIFFERENTIAL
Baso # K/uL: 0 10*3/uL (ref 0.0–0.1)
Basophil %: 0.8 %
Eos # K/uL: 0.1 10*3/uL (ref 0.0–0.5)
Eosinophil %: 1.6 %
Hematocrit: 43 % (ref 40–51)
Hemoglobin: 15.1 g/dL (ref 13.7–17.5)
IMM Granulocytes #: 0 10*3/uL (ref 0.0–0.1)
IMM Granulocytes: 0.2 %
Lymph # K/uL: 1.6 10*3/uL (ref 1.3–3.6)
Lymphocyte %: 31.3 %
MCH: 34 pg/cell — ABNORMAL HIGH (ref 26–32)
MCHC: 35 g/dL (ref 32–37)
MCV: 96 fL — ABNORMAL HIGH (ref 79–92)
Mono # K/uL: 0.5 10*3/uL (ref 0.3–0.8)
Monocyte %: 10.9 %
Neut # K/uL: 2.7 10*3/uL (ref 1.8–5.4)
Nucl RBC # K/uL: 0 10*3/uL (ref 0.0–0.0)
Nucl RBC %: 0 /100 WBC (ref 0.0–0.2)
Platelets: 109 10*3/uL — ABNORMAL LOW (ref 150–330)
RBC: 4.5 MIL/uL — ABNORMAL LOW (ref 4.6–6.1)
RDW: 12.7 % (ref 11.6–14.4)
Seg Neut %: 55.2 %
WBC: 5 10*3/uL (ref 4.2–9.1)

## 2017-09-19 LAB — CHLAMYDIA PLASMID DNA AMPLIFICATION: Chlamydia Plasmid DNA Amplification: 0

## 2017-09-19 LAB — N. GONORRHOEAE DNA AMPLIFICATION: N. gonorrhoeae DNA Amplification: 0

## 2017-09-19 MED ORDER — OMEPRAZOLE 40 MG PO CPDR *I*
40.0000 mg | DELAYED_RELEASE_CAPSULE | Freq: Every day | ORAL | 0 refills | Status: DC
Start: 2017-09-19 — End: 2018-02-20

## 2017-09-19 MED ORDER — SULFAMETHOXAZOLE-TRIMETHOPRIM 800-160 MG PO TABS *I*
1.0000 | ORAL_TABLET | Freq: Every day | ORAL | 0 refills | Status: DC
Start: 2017-09-19 — End: 2019-01-20

## 2017-09-19 MED ORDER — BICTEGRAVIR-EMTRICITAB-TENOFOV 50-200-25 MG PO TABS *I*
1.0000 | ORAL_TABLET | Freq: Every day | ORAL | 0 refills | Status: DC
Start: 2017-09-19 — End: 2018-02-20

## 2017-09-19 NOTE — Progress Notes (Signed)
[facility] MISSED/CANCELLED APPOINTMENT     Name: Derek NielsenRALPH D Walker  MRN: [id number]   DOB: [birth year]    Date of Scheduled Service: 09/18/17    Derek Walker was a no show for today's appointment.  I have spoken with pt's mother. [name], Gavin PoundDeborah stated that pt was planning on no showing for court on this date 09/18/17. Court ended up being cancelled and wtr informed pt's mother to inform pt. If court had not been cancelled and pt had no showed, as he was to appear in front of sentencing Wyvonne LenzJudge Yacknin for a violation of his probation, pt would have most likely had a warrant out for his arrest. Pt was informed of this and still refused to attend court. ACT Team will follow-up to find new court appearance date.    Additional Information:    09/19/17 ACT Team will attempt to pick up pt for ID clinic appointment at 10am     [facility] Center[facility]annon Denise Bramblett, LMSW
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
## 2017-09-22 ENCOUNTER — Telehealth: Payer: Self-pay | Admitting: Infectious Diseases

## 2017-09-22 ENCOUNTER — Ambulatory Visit: Payer: Medicaid Other

## 2017-09-22 LAB — HIV VIRAL LOAD
HIV-1 PCR log: NEGATIVE copies/mL
HIV-1 PCR: NEGATIVE copies/mL

## 2017-09-22 NOTE — Telephone Encounter (Signed)
Please let patient know he can stop the Bactrim. He should STAY on the Biktarvy.

## 2017-09-22 NOTE — Progress Notes (Signed)
[facility] Progress Note     Length of Session: 15 minutes    Contact Type:  Location: Off Site    Collateral Contact , Wtr spoke with pt's mother and uncle face-to-face      Session Content::  Wtr attempted home visit at pt's mother's residence and at pt's s/o's residence at approximately 10:30am. Derek Walker was unable to locate pt. Wtr re-attempted home visit at mother's residence in the afternoon. Wtr spoke to pt's mother and uncle face-to-face and delivered medications as they expected Derek Walker to return later in the afternoon on this date 09/22/17. Wtr spoke with pt's s/o Derek Walker over the phone and she stated that Derek Walker was not there this morning, nor in the afternoon when wtr called. [name] stated that she did not hear from Derek Walker from Friday to Sunday which she reported was "highly unusual" and stated "he calls me everyday. I was worried sick. I finally went over to Derek Walker's and banged on the door until he answered. Every time I called Derek Walker she told me that he was sleeping--but for an entire day? It didn't seem right to me". Wtr provided support to pt's mother and agreed to attempt to contact Derek Walker to discuss further.     Interventions:  Collateral contact with:  pt's mother and Uncle  Wtr delivered medications at pt's mother's residence      Plan:  Psychotherapy continues as described in care plan; plan remains the same.    NEXT APPT: 09/29/17, as per ACT calendar      Derek Walker, LMSW

## 2017-09-23 NOTE — Telephone Encounter (Signed)
Attempted to reach Pt but Pt was unavailable at both numbers. Unable to leave a voicemail on mobile phone. Will attempt another time

## 2017-09-24 NOTE — Telephone Encounter (Signed)
Attempted to call pt. Unable to reach pt using both phone numbers provided.
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
## 2017-09-29 ENCOUNTER — Encounter: Payer: Self-pay | Admitting: Gastroenterology

## 2017-09-29 ENCOUNTER — Ambulatory Visit: Payer: Medicaid Other
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
## 2017-09-30 ENCOUNTER — Ambulatory Visit: Payer: Medicaid Other | Admitting: Psychiatry
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
## 2017-10-03 ENCOUNTER — Ambulatory Visit: Payer: Medicaid Other

## 2017-10-06 NOTE — Progress Notes (Signed)
Behavioral Health Progress Note     Length of Session: 15 minutes    Contact Type:  Location: Off Site    Collateral Contact with pt's mother, Deboarah      Session Content::  Wtr called pt's mother's residence in the morning and pt's Uncle answered the phone and stated that [name] was not there, but perhaps at [name]'s. Wtr thanked [employer]pt's Uncle and called [name]'s cell phone. [name] answered and stated that [name] was "in the store" and asked wtr to call back in 10 minutes. Wtr called back in 10 minutes and [name] answered [name]'s cell phone. Pt sounded pleasant, stable-- difficult to assess further. Wtr asked [name] if he would meet wtr as wtr had medications to deliver. Rayna Sextonalph asked, "Medications? Medications besides the ones you delivered last week?" Wtr stated, "Yes, actually--" and opened the bag of medications to read to pt. Pt stated, "Okay, I can be over to the house in about 30 minutes". Wtr asked, "to [name]'s house?" and [name] stated, "No, to my mom's". Wtr agreed and stated that wtr would see pt there. Wtr arrived to pt's mother's house approximately 45 minutes later. Wtr called pt's mother's residence and Gavin PoundDeborah answered stating that [name] was not there yet. Wtr asked if wtr could speak with pt's mother briefly and deliver medications for [name], in hopes that during conversation pt would also show-up. [name] Gavin PoundDeborah stated that she was "hanging in there" since her sister passed away this past month. Wtr gave condolences to pt's mother. Gavin PoundDeborah stated that [name] had continued to be back and forth between her house and [name]'s, but was finding it more difficult to get a hold of [name]-- when she is used to TroupRalph calling her at least once a day. Wtr delivered medications and asked pt's mother to pass along to LynwoodRalph. [name] agreed. Wtr and pt's mother said goodbyes and wtr left. Pt was a no-show.       Interventions:  Collateral contact with:  pt's mother, Gavin PoundDeborah    Current Treatment Plan    Created/Updated On 09/29/2017   Next Treatment Plan Due 03/29/2018       Plan:  Other planned interventions or recommendations: continue attempting visits with pt for engagement, continue motivational interviewing for pt to broaded ambivalence around alc use, encourage pt to remain in compliance with probation and sentencing Wyvonne LenzJudge Yacknin    NEXT APPT: as per ACT calendar      Alinda MoneyShannon Ayven Pheasant, LMSW

## 2017-10-07 ENCOUNTER — Ambulatory Visit: Payer: Medicaid Other

## 2017-10-07 NOTE — Progress Notes (Signed)
[facility] MISSED/CANCELLED APPOINTMENT     Name: Derek NielsenRALPH D Ishaq  MRN: [id number]   DOB: [birth year]    Date of Scheduled Service: 10/07/2017    Mr. Neiswonger was a no show for today's appointment.  I have left the patient a message to contact me.    Additional Information:    Wtr spoke with pt's mother on the phone for approximately 10 minutes. Leondro's mother, Iran SizerDeboarah, states that [name] had called her earlier on this date 10/07/2017 stating that he would stop by, and had not done so yet. Gavin PoundDeborah stated that she had called back pt on his s/o's phone ([name]) and that "they aren't answering anymore". Gavin PoundDeborah reported to wtr that pt and [name] had [place] [facility] [place] 09[place]29/2017 at 9:30 am but were told by the [facility] that [place] [facility] was not until 2:00pm. [name] reported to Trevonne's mother that they had gone [facility] [employer], spent hours there, and then that [name] went into a corner store and decided he wanted a drink and then stated, "It was all over then". Gavin PoundDeborah reports that [employer]Violet stated that [employer]iolet and [name] returned to court for 2pm appearance with Wyvonne LenzJudge Yacknin and security would not allow [name] into the court house because he was "too intoxicated" and pt missed his court appearance.  Wtr shared with Gavin PoundDeborah that wtr tried to find [name] at her residence on [address] And then at [employer] on [address] on 09/29/2017 and was unable to locate him and went onto courthouse for 9:30am court and had not seen Rayna Sextonalph or [name]. [name] seemed to hope that pt would not be penalized for not appearing in court as "he was there, but they wouldn't let him in", despite this being because pt was too intoxicated-- a violation of his probation. Wtr thanked Information systems managerDeborah and asked her to have [name] call wtr as the ACT Team has not seen Rayna Sextonalph since 09/19/17, despite attempts on 09/22/17, 09/29/17, and 10/03/17. Gavin PoundDeborah agreed to do so and thanked wtr. Gavin PoundDeborah stated that she hoped that [name] would go inpatient for CD treatment and  stated, "his drinking isn't helping him any". Wtr and Gavin PoundDeborah said goodbyes and hung up.      Alinda MoneyShannon Severiano Utsey, LMSW

## 2017-10-10 ENCOUNTER — Ambulatory Visit: Payer: Medicaid Other | Admitting: Infectious Diseases

## 2017-10-13 ENCOUNTER — Ambulatory Visit: Payer: Medicaid Other

## 2017-10-13 NOTE — Progress Notes (Signed)
[facility] MISSED/CANCELLED APPOINTMENT     Name: Sunnie NielsenRALPH D Ninneman  MRN: [id number]   DOB: [birth year]    Date of Scheduled Service: 10/13/2017    Mr. Melching was a no show for today's appointment.  I have left the patient a message to contact me.    Additional Information:    Not applicable     Alinda MoneyShannon Rosario Duey, LMSW

## 2017-10-15 ENCOUNTER — Ambulatory Visit: Payer: Medicaid Other | Admitting: Infectious Diseases

## 2017-10-15 ENCOUNTER — Ambulatory Visit: Payer: Medicaid Other

## 2017-10-15 ENCOUNTER — Telehealth: Payer: Self-pay | Admitting: Infectious Diseases

## 2017-10-15 NOTE — Telephone Encounter (Signed)
Darrick GrinderShannon Holcomb from the ACT Team ([facility]) called to cancel this patient's appointment with Mardene CelesteKelly Farrow today, 1/30 at 1:00pm. Patient has been unable to locate and until he can be found and an attendance schedule be completed, this appointment was not rescheduled. She can be reached at [phone] if needed.

## 2017-10-16 NOTE — Progress Notes (Signed)
[facility] MISSED/CANCELLED APPOINTMENT     Name: Derek NielsenRALPH D Walker  MRN: [id number]   DOB: August 22, 1976    Date of Scheduled Service: 10/15/2017     Derek Walker was a no show for today's appointment.  I have left the patient a message to contact me.    Additional Information:    Wtr spoke with pt's mother who stated that Derek Walker had been at her residence earlier in the day and headed over to his s/o's residence, Derek Walker. Wtr went to Derek Walker's residence and called Derek Walker's cell phone, with no response.     Alinda MoneyShannon Ismar Yabut, LMSW

## 2017-10-20 ENCOUNTER — Ambulatory Visit: Payer: Medicaid Other | Attending: Psychiatry

## 2017-10-20 DIAGNOSIS — F323 Major depressive disorder, single episode, severe with psychotic features: Secondary | ICD-10-CM | POA: Insufficient documentation

## 2017-10-20 DIAGNOSIS — F333 Major depressive disorder, recurrent, severe with psychotic symptoms: Secondary | ICD-10-CM | POA: Insufficient documentation

## 2017-10-21 ENCOUNTER — Other Ambulatory Visit: Payer: Self-pay | Admitting: Psychiatry

## 2017-10-21 NOTE — Progress Notes (Signed)
Behavioral Health Progress Note     Length of Session: 15 minutes    Contact Type:  Location: Off Site    Collateral Contact, Face to Face         Session Content::  Wtr spoke to pt's mother Neoma Laming on the phone, who stated that she had "just had him on the other line", and stated that he was at [name]'s. Wtr attempted to see pt at [name]'s, [name] answered the door and stated that Doyel had been gone since that morning as he walked his youngest daughter Volney Presser to school, and had not returned. Wtr clarified as pt's mother had stated that Abdulah has just been at [employer], but [name] maintained that Emmerson was not and had not just been present at her home.    Wtr went back to pt's mother's residence and knocked on the side door. Pt's Uncle Horris answered the door and stated that he was unsure of pt's whereabouts. Wtr delivered medications and hand-written note to pt's Uncle Horris and stated he would let Eman know that the medications and note were waiting for him at his mother's residence if he came by or called. Wtr thanked [employer] and left.       Interventions:  Collateral contact with:  pt's Uncle Horris        Plan:  Other planned interventions or recommendations: Wtr spoke with ACT Team Leader Erin Arta Bruce, [facility] and Dr. Donny Pique to discuss if ACT Team should continue delivering medications to pt's mother's residence. When wtr has spoken to pt on the phone he has confirmed that he had picked up medications, although his medication compliance is expected to be continued poor adherence. It was decided that wtr would deliver medications to pt's mother's residence on this date 10/20/2017, and leave a message for Fitzgerald him that the ACT Team would no longer deliver medications until pt met with ACT Team and was seen by a provider. Wtr left a hand-written note with pt's Uncle Horris on this date 10/20/17.    NEXT APPT: will re-attempt home visit 10/23/17, as per ACT calendar      Danise Edge, LMSW

## 2017-10-23 ENCOUNTER — Ambulatory Visit: Payer: Medicaid Other

## 2017-10-24 NOTE — Progress Notes (Signed)
[facility] MISSED/CANCELLED APPOINTMENT     Name: Derek NielsenRALPH D Walker  MRN: [id number]   DOB: 05-Sep-1976    Date of Scheduled Service: 10/23/17    [name] was a no show for today's appointment.  I have left the patient a message to contact me.    Additional Information:    Wtr spoke with pt's mother, Derek Walker, over the phone to deliver message to Derek Walker that ACT Team will not be able to deliver medications to pt since not having face-to-face contact with pt since 09/19/17. Pt will need to meet with ACT provider to cx medication regimen as prescribed---per intervention identified by ACT Team Leader, Derek Walker, [facility] and Dr. Maxie Betterobert Walker     Derek Walker, LMSW

## 2017-10-27 ENCOUNTER — Ambulatory Visit: Payer: Medicaid Other | Admitting: Psychiatry

## 2017-10-31 DIAGNOSIS — F4310 Post-traumatic stress disorder, unspecified: Secondary | ICD-10-CM

## 2017-10-31 DIAGNOSIS — F1010 Alcohol abuse, uncomplicated: Secondary | ICD-10-CM

## 2017-10-31 DIAGNOSIS — F3289 Other specified depressive episodes: Secondary | ICD-10-CM

## 2017-10-31 NOTE — Progress Notes (Signed)
[facility] ACT Team Clinical Management Note     Refills are available for patient and are being held at this time per the direction of ACT TL E Pomerantz-Castillo LMHC and ACT Medical [facility] Marlena Clipperob Weisman DO due to pt not having had a FTF interaction with the team since 09/19/17.  ACT Nursing staff advised pt will need to meet with ACT Providers to continue with mediation regimen as prescribed including refills.      Refills include:      Olanzapine 5 mg    Olanzapine 15 mg    Gabapentin 100 mg     Laroy AppleLisa A Deyra Perdomo, RN

## 2017-11-05 ENCOUNTER — Ambulatory Visit: Payer: Medicaid Other

## 2017-11-06 ENCOUNTER — Telehealth: Payer: Self-pay | Admitting: Mental Health
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
## 2017-11-07 ENCOUNTER — Ambulatory Visit: Payer: Medicaid Other

## 2017-11-08 NOTE — Progress Notes (Signed)
[facility] ACT Team Clinical Management Note     Type of visit: Collateral Contact, face-to-face  Duration: 15 minutes    Wtr attended probation appointment with another ACT Team pt and asked to speak with PO privately after appointment to discuss [name]. [name]'s PO, Jonette Mate, stated that she had been out twice herself to attempt contact with pt and had made several phone calls and was still unable to have contact or locate pt. Wtr shared that the ACT Team had done the same and was also unable to reach pt though had contact with pt's mother, Uncle, and [name] (s/o) and knew that pt had been seen. Wtr reviewed with Jonette Mate that the ACT Team had stopped delivering medications and informed pt's collateral contacts and left notes for pt at both his mother's and s/o's residences for pt informing him that the the ACT Team would not deliver medications until he was seen by ACT Team and met with provider to assess pt. Wtr shared that pt's mother had stated that pt may have been nervous that wtr would bring police with wtr to see pt and wtr had informed pt's mother, Uncle, and s/o that wtr would like to align with pt above all else and would not have police with wtr when attempting to see pt. Wtr reviewed attempted alignment with pt that pt had agreed to do, including writing clinical letter of recommendation to pt's sentencing [name] [place] [employer]. Wtr and [place] agreed to be in touch if contact was made with pt.    Wtr and [employer], PO said goodbyes and wtr left.    Danise Edge, LMSW

## 2017-11-11 ENCOUNTER — Other Ambulatory Visit: Payer: Self-pay | Admitting: Psychiatry
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
## 2017-11-13 ENCOUNTER — Ambulatory Visit: Payer: Medicaid Other

## 2017-11-15 NOTE — Progress Notes (Signed)
Behavioral Health Progress Note     Length of Session: 60 minutes    Contact Type:  Location: Off Site    Collateral Contact, Indirect     Problem(s)/Goals Addressed from Treatment Plan:  Treatment Problem #1 03/27/2017, continued from 09/29/2017   Patient Identified Problem Alc/drugs and instability at pt's s/o's and mother's residences       Treatment Goal #1 03/27/2017, continued from 09/29/2017   Patient Identified Goal "I will get my own apartment asap".      Progress toward goal(s): N/A - New goal    1 a. Measurable Objectives : Derek Walker will work with ACT Team at least 1x a month to form a Barrister's clerk, check-in with ongoing management of finances throughout month, and to note any adjustments that may need to be made after reviewing the month's income and overall expenses.  Date established: 03/27/2017 and continued on from this date 09/29/2017  Target date: 03/29/2018  Attained or Revised? Revised    1 b. Measurable Objectives :Derek Walker will meet with ACT staff at least twice monthly to discuss and receive assistance in searching for safe and affordable housing, scheduling tours, making phone calls, completing applications, etc. As demonstrated by Derek Walker having a safe and affordable place to live in the next 6 months.  Date established:09/02/2016 and continued on from this date 09/29/2017  Target date: 03/29/2018  Attained or Revised? Revised    1 c. Measurable Objectives : Derek Walker will continue to utilize clinical appointments with the ACT Team to address communication and conflict with s/o, pt's mother, and other family members to assist pt in establishing healthy boundary lines and communication styles  Date established: 03/27/2017 and continued on from this date 09/29/2017  Target date: 03/29/2018  Attained or Revised?  Revised      ..........................................................................................................................................    Treatment Problem #2 03/27/2017, continued from 09/29/2017   Patient Identified Problem poor physical and mental health       Treatment Goal #2 03/27/2017, continued from 09/29/2017   Patient Identified Goal "I will get my health back in order (attending medical and clinical appointments, nutrition/weight)".       Progress toward goal(s): No change. Comment: Pt has had brief interludes of sobriety and attending clinical appointments but has continued to struggle to attend medical appointments and this continued avoidance has become apoint of excessive anxiety and stress for pt due to ongoing health concerns that are unaddressed.Pt has continued to move towards his goal of successfully completing outpatient CD treatment at Derek Walker. Pt was in Phase II of treatment at [facility]., but as of this date 09/29/2017 has been terminated due to lack of attendance.      2a. Measurable Objectives : [facility]  Date established: 09/02/16  Target date: 09/27/2017  Attained or Revised? Discontinued, see revised objective 2aa    2aa. Measurable Objective: Derek Walker will address his chemical dependency at the level of care that his sentencing Derek Walker sees appropriate.                          Date established: 09/29/2017                          Target date: 03/29/2018  Attained or Revised? new    2b. Measurable Objectives : Derek Walker will discuss barriers to attending medical and clinical appointments and work with ACT Team to overcome these challenges to increase pt's compliance with treatment recommendation at least 1x a month.  Date established: 03/27/2017 and continued  09/29/2017  Target date: 03/29/2018  Attained or Revised? Revised    2c. Measurable Objectives : Derek Walker will continue to explore and discuss cognitive dissonance between continued use of alc and maintaining sobriety and the effects of pt's alc use on his physical and mental health.  Date established: 03/27/2017 and continued 09/29/2017  Target date: 03/29/2018  Attained or Revised? Revised      Mental Status Exam:  APPEARANCE: Casual  ATTITUDE TOWARD INTERVIEWER: Evasive  MOTOR ACTIVITY: WNL (within normal limits)  Derek CONTACT: Avoidant  SPEECH: Soft and Minimal  AFFECT: Flat  MOOD: Neutral  THOUGHT PROCESS: Circumstantial  THOUGHT CONTENT: No unusual themes  PERCEPTION: No evidence of hallucinations  CURRENT SUICIDAL IDEATION: patient denies  CURRENT HOMICIDAL IDEATION: Patient denies  ORIENTATION: Alert and Oriented X 3.  CONCENTRATION: WNL  MEMORY:   Recent: intact   Remote: intact  COGNITIVE FUNCTION: Average intelligence  JUDGMENT: Impaired -  moderate  IMPULSE CONTROL: Poor  INSIGHT: Poor    Risk Assessment:  ASSESSMENT OF RISK FOR SUICIDAL BEHAVIOR  Changes in risk for suicide from baseline Formulation of Risk and/or previous intake, including newly identified risk, if any: none    Session Content::  Wtr attended pt's court appearance in front of Derek Walker, discussing pt's violation of probation and how to move forward. Derek Walker offered pt to stay in custody until a bed was available at inpatient Chemical Dependency facility or to be released on this date and to independently attend inpatient CD treatment by next court appearance April 4th at 2pm. Pt stated that he would like to be released on this date and to independently attend inpatient CD treatment.    Wtr sat down next to pt's s/o Derek Walker during pt's court appearance. Pt gestured "kiss kiss" to his s/o Derek Walker when he first entered the court room. Pt sat down until seen in front of the  Derek Walker, and besides making Derek contact with Derek Walker a few times since entering the court room, he had a flat affect, and avoided Derek contact with wtr.     Wtr ensured that Derek Walker had wtr's phone number and asked Derek Walker to have Derek Walker call wtr to schedule next appointment with ACT Team. Derek Walker was agreeable and stated she would go wait to pick up Derek Walker upon release [facility]. Wtr and Derek Walker said goodbyes and wtr left.     Visit Diagnosis:    Major psychotic depression, recurrent- Primary ICD-10-CM: F33.3  ICD-9-CM: 296.34     PTSD (post-traumatic stress disorder) ICD-10-CM: F43.10  ICD-9-CM: 309.81     Alcohol abuse ICD-10-CM: F10.10  ICD-9-CM: 305.00         Interventions:  Collateral contact with:  with pt's s/o Derek Walker  Wtr attended pt's court apearance in front of Derek Walker    Current Treatment Plan   Created/Updated On 09/29/2017   Next Treatment Plan Due 03/29/2018         Plan:  Other planned interventions or recommendations: ACT Team will continue to attempt scheduling a visit with ACT provider so that ACT may assess pt and continue medication delivery.     NEXT APPT: as per ACT calendar      Alinda MoneyShannon Raia Amico, LMSW

## 2017-11-17 ENCOUNTER — Ambulatory Visit: Payer: Medicaid Other | Admitting: Psychiatry

## 2017-11-17 NOTE — Progress Notes (Signed)
Behavioral Health Psychopharmacology Follow-up     Length of Session: 15 minutes.    Diagnosis Addressed    ICD-10-CM ICD-9-CM   1. 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 PTSD (post-traumatic stress disorder) F43.10 309.81   3. Alcohol abuse F10.10 305.00       Chief Complaint: Unable to find patient    There were no vitals taken for this visit.  Wt Readings from Last 3 Encounters:   09/19/17 73 kg (161 lb)   04/16/17 74.4 kg (164 lb)   02/06/17 74.7 kg (164 lb 11.2 oz)       Recent History and Response to Medications    Participants: Dr. Redmond School and Dr. Odetta Pink.    Patient could not be seen. Called cell phone number [phone]) which had no response. Spoke to mother Derek Walker [phone]) who said she spoke to patient earlier today and he seemed well. She did not have any idea of his whereabouts but stated that she would call him and encourage him to reach out to ACT team.     Current use of alcohol or drugs: unknown    ENERGY: UTA  SLEEP:UTA  APPETITE: UTA  WEIGHT: No Change UTA  SEXUAL FUNCTION: not asked UTA  ENJOYMENT/INTEREST: UTA    Review of Systems:  Pertinent items are noted in HPI.    Current Medications  Current Outpatient Prescriptions   Medication Sig    naltrexone (DEPADE) 50 MG tablet TAKE 1 TABLET (50MG TOTAL) BY MOUTH DAILY    mirtazapine (REMERON) 7.5 MG tablet TAKE 1 TABLET (7.5MG TOTAL) BY MOUTH NIGHTLY.    bictegravir-emtricitabine-tenofovir (BIKTARVY) 50-200-25 MG per tablet Take 1 tablet by mouth daily    sulfamethoxazole-trimethoprim (BACTRIM DS,SEPTRA DS) 800-160 MG per tablet Take 1 tablet by mouth daily    omeprazole (PRILOSEC) 40 MG capsule Take 1 capsule (40 mg total) by mouth daily    GABAPENTIN 100 MG capsule TAKE 1 CAPSULE (100MG TOTAL) BY MOUTH THREE TIMES A DAY.    mirtazapine (REMERON) 15 MG tablet TAKE 1 TABLET (15MG TOTAL) BY MOUTH NIGHTLY.    OLANZapine (ZYPREXA) 15 MG tablet TAKE 1 TABLET (15MG TOTAL) BY MOUTH NIGHTLY.    OLANZapine (ZYPREXA) 5 MG  tablet TAKE 1 TABLET (5MG TOTAL) BY MOUTH EVERY MORNING.    efavirenz-emtrictabine-tenofovir (ATRIPLA) 600-200-300 MG per tablet Take 1 tablet by mouth nightly on an empty stomach    ciprofloxacin (CIPRO) 500 MG tablet Take 1 tablet (500 mg total) by mouth 2 times daily     No current facility-administered medications for this visit.        Side Effects  Other: UTA    Mental Status  Unable to assess.     Risk Assessment  UTA    [place] Scale administered? N/A    Results  Lab Results   Component Value Date    CHOL 242 (!) 05/06/2017    HDL 153 05/06/2017    LDLC 78 05/06/2017    TRIG 57 05/06/2017    CHHDC 1.6 05/06/2017    GLU 93 09/19/2017    HA1C 5.0 05/06/2017    WBC 5.0 09/19/2017    ASEGR 2.7 09/19/2017       No results for input(s): TSH in the last 8760 hours.        Lab results: 09/19/17  1146   Sodium 141   Potassium 3.7   Chloride 97   CO2 27   UN 6   Creatinine 0.77   GFR,Caucasian 112  GFR,Black 130   Glucose 93   Calcium 9.2   Total Protein 7.7   Albumin 4.7   ALT 83*   AST 162*   Alk Phos 87   Bilirubin,Total 0.8         Assessment:  Derek Walker is a 42 year old african [place] male with a history of PTSD, depression, anxiety and nicotine dependence that we attempted to meet for his psychopharmacology follow up. Was able to get collateral from mother Derek Walker who stated that he was okay and who was going to encourage him to reach out to ACT team. Will continue our outreach attempts.     Plan and Rationale:  1. Continue with the current medication regimen of olanzapine 5m PO QAM and 188mPO QHS for psychosis, mirtazapine 1530mO QHS for depression/anxiety, gabapentin 200m75m TID for anxiety   2. Continue to provide motivational interviewing related to smoking cessation.  3. Continue to encourage abstinence from alcohol use.  4. Contact ACT emergency on call phone for urgent issues.       Greater than 50% of visit time spent counseling/coordinating care. Time spent counseling:  15 minutes

## 2017-11-19 ENCOUNTER — Other Ambulatory Visit: Payer: Self-pay | Admitting: Psychiatry

## 2017-11-19 DIAGNOSIS — F323 Major depressive disorder, single episode, severe with psychotic features: Secondary | ICD-10-CM

## 2017-11-28 NOTE — Addendum Note (Signed)
Addended byRoselyn Reef: Lillyonna Armstead on: 11/28/2017 01:53 PM     Modules accepted: Level of Service, SmartSet

## 2017-11-28 NOTE — Progress Notes (Signed)
This encounter was created in error - please disregard.

## 2017-12-02 ENCOUNTER — Other Ambulatory Visit: Payer: Self-pay | Admitting: Psychiatry

## 2017-12-02 ENCOUNTER — Telehealth: Payer: Self-pay | Admitting: Mental Health
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
## 2017-12-03 ENCOUNTER — Ambulatory Visit: Payer: Medicaid Other | Attending: Psychiatry

## 2017-12-03 DIAGNOSIS — F333 Major depressive disorder, recurrent, severe with psychotic symptoms: Secondary | ICD-10-CM | POA: Insufficient documentation

## 2017-12-03 DIAGNOSIS — F323 Major depressive disorder, single episode, severe with psychotic features: Secondary | ICD-10-CM | POA: Insufficient documentation
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
## 2017-12-04 ENCOUNTER — Other Ambulatory Visit: Payer: Self-pay | Admitting: Psychiatry

## 2017-12-04 MED ORDER — OLANZAPINE 10 MG PO TABS *I*
10.0000 mg | ORAL_TABLET | Freq: Every evening | ORAL | 2 refills | Status: DC
Start: 2017-12-04 — End: 2019-01-20

## 2017-12-04 MED ORDER — GABAPENTIN 100 MG PO CAPSULE *I*
200.0000 mg | ORAL_CAPSULE | Freq: Three times a day (TID) | ORAL | 2 refills | Status: DC
Start: 2017-12-04 — End: 2019-01-20

## 2017-12-08 ENCOUNTER — Ambulatory Visit: Payer: Medicaid Other

## 2017-12-08 NOTE — Progress Notes (Signed)
Behavioral Health Progress Note     Length of Session: 15 minutes    Contact Type:  Location: Off Site    Collateral Contact       Session Content::  Wtr texted pt this morning to review that wtr would pick-up pt for probation appointment with no response from pt. Wtr called Rayna Sextonalph and pt answered, wtr confirmed wtr would pick up pt and pt agreed. Pt called back twice and left no voicemail, while wtr was in clinical morning meeting. Wtr called pt back after morning having seen the missed calls from pt. [name] answered and stated that he had just gotten out of the shower and had not realized that his s/o [name] had put his clothes in the laundry and would need 30-45 minutes for his clothing to be dried. Wtr stated the time that wtr would arrive in 30-45 minutes and pt stated, "No, what about 11:00am, that's better. But if you can't do it, than I'll just take the bus". Wtr reviewed that wtr would be there in 30-45 minutes as pt requested and would pick up pt. Pt agreed. Wtr confirmed that pt was at [name]'s residence. Wtr and pt said goodbyes and hung up.    Wtr arrived at pt's s/o's residence on [address] Wtr knocked on the front door and pt's s/o's son Briant Cedar([name], approximately 42 y/o) answered the front door and stated wtr could come in and he would get Rayna Sextonalph for wtr. Wtr stepped inside the doorway and two other young men were sitting in the front room of pt's s/o's residence, one of which was rocking a new born baby, and asked wtr to shut the door all the way. It seemed the three young men were watching a movie together. Wtr shut the door and [name] returned shortly thereafter and stated, "I guess he left. He already left". Wtr thanked PPL CorporationSamaj.    Wtr went to probation building, in hopes of catching pt at probation appointment. Administrative staff reported that pt had not "checked-in" as of yet and called pt's newly assigned PO, Brooke Wilson. Wtr spoke with Nehemiah SettleBrooke to update her on Alani's upcoming court appearance  and continued non-compliance with mental health and CD treatment and general evasiveness. Nehemiah SettleBrooke stated that since pt's release on 11/13/2017, pt has missed 2 probation reportings. Wtr shared pt's new cell phone number [phone] as PO had pt's outdated cell phone number. Wtr thanked Brooke for her time and Nehemiah SettleBrooke stated that she would be in touch with wtr if pt showed for probation appointment.     [facility] [address] [phone] and found that pt's upcoming court appearance with Wyvonne LenzJudge Yacknin is 12/18/2017.      Interventions:  Collateral contact with:  pt's newly assigned PO, Sherrilyn RistBrooke Wilson, [phone]      Plan:  Other planned interventions or recommendations: Continue to work closely with probation as pt has upcoming court appearance in front of Wyvonne LenzJudge Yacknin on 12/18/17 and Sharee PimpleJudge "promised pt sentencing of 1 year" if pt continued non-compliance per probation and mental health treatment (including wtr's clinical recommendation that pt go to CD inpatient)    NEXT APPT:  As per ACT calendar      Alinda MoneyShannon Jewelianna Pancoast, LMSW

## 2017-12-12 ENCOUNTER — Ambulatory Visit: Payer: Medicaid Other

## 2017-12-15 NOTE — Progress Notes (Signed)
Behavioral Health Progress Note     Length of Session: 30 minutes    Contact Type:  Location: Off Site    Collateral Contact, Face to Face     Problem(s)/Goals Addressed from Treatment Plan:  Treatment Problem #1 03/27/2017, continued from 09/29/2017   Patient Identified Problem Alc/drugs and instability at pt's s/o's and mother's residences       Treatment Goal #1 03/27/2017, continued from 09/29/2017   Patient Identified Goal "I will get my own apartment asap".      Progress toward goal(s): N/A - New goal    1 a. Measurable Objectives : Raykwon will work with ACT Team at least 1x a month to form a Artist, check-in with ongoing management of finances throughout month, and to note any adjustments that may need to be made after reviewing the month's income and overall expenses.  Date established: 03/27/2017 and continued on from this date 09/29/2017  Target date: 03/29/2018  Attained or Revised? Revised    1 b. Measurable Objectives :Deroy will meet with ACT staff at least twice monthly to discuss and receive assistance in searching for safe and affordable housing, scheduling tours, making phone calls, completing applications, etc. As demonstrated by [name] having a safe and affordable place to live in the next 6 months.  Date established:09/02/2016 and continued on from this date 09/29/2017  Target date: 03/29/2018  Attained or Revised? Revised    1 c. Measurable Objectives : Nareg will continue to utilize clinical appointments with the ACT Team to address communication and conflict with s/o, pt's mother, and other family members to assist pt in establishing healthy boundary lines and communication styles  Date established: 03/27/2017 and continued on from this date 09/29/2017  Target date: 03/29/2018  Attained or Revised?  Revised      ..........................................................................................................................................    Treatment Problem #2 03/27/2017, continued from 09/29/2017   Patient Identified Problem poor physical and mental health       Treatment Goal #2 03/27/2017, continued from 09/29/2017   Patient Identified Goal "I will get my health back in order (attending medical and clinical appointments, nutrition/weight)".       Progress toward goal(s): No change. Comment: Pt has had brief interludes of sobriety and attending clinical appointments but has continued to struggle to attend medical appointments and this continued avoidance has become apoint of excessive anxiety and stress for pt due to ongoing health concerns that are unaddressed.Pt has continued to move towards his goal of successfully completing outpatient CD treatment at [facility]. Pt wasin Phase II of treatment at [facility] but as of this date 09/29/2017 has been terminated due to lack of attendance.      2a. Measurable Objectives : I will successfully complete [facility] [facility] treatment  Date established: 09/02/16  Target date: 09/27/2017  Attained or Revised? Discontinued, see revised objective 2aa    2aa. Measurable Objective: Paiden will address his chemical dependency at the level of care that his sentencing [name] sees appropriate.  Date established: 09/29/2017  Target date: 03/29/2018  Attained or Revised? new    2b. Measurable Objectives : Gwyn will discuss barriers to attending medical and clinical appointments and work with ACT Team to overcome these challenges to increase pt's compliance with treatment recommendation at least 1x a month.  Date established: 03/27/2017 and continued  09/29/2017  Target date: 03/29/2018  Attained or Revised? Revised    2c. Measurable Objectives : Placido will continue to explore  to explore and discuss cognitive dissonance between continued use of alc and maintaining sobriety and the effects of pt's alc use on his physical and mental health.  Date established: 03/27/2017 and continued 09/29/2017  Target date: 03/29/2018  Attained or Revised? Revised      Mental Status Exam:  APPEARANCE: Casual  ATTITUDE TOWARD INTERVIEWER: Cooperative  MOTOR ACTIVITY: WNL (within normal limits)  EYE CONTACT: Direct  SPEECH: Normal rate and tone  AFFECT: Pleasant  MOOD: Anxious  THOUGHT PROCESS: Circumstantial and Goal directed  THOUGHT CONTENT: No unusual themes  PERCEPTION: Within normal limits and No evidence of hallucinations  CURRENT SUICIDAL IDEATION: patient denies  CURRENT HOMICIDAL IDEATION: Patient denies  ORIENTATION: Alert and Oriented X 3.  CONCENTRATION: WNL  MEMORY:   Recent: intact   Remote: intact  COGNITIVE FUNCTION: Average intelligence  JUDGMENT: Impaired -  mild  IMPULSE CONTROL: Poor  INSIGHT: Fair    Risk Assessment:  ASSESSMENT OF RISK FOR SUICIDAL BEHAVIOR  Changes in risk for suicide from baseline Formulation of Risk and/or previous intake, including newly identified risk, if any: none    Session Content::  Wtr arrived at pt's s/o's residence ([name]) and called [name]'s cell phone. [name] answered and gave the phone to [place]. Wtr greeted Daley and stated that wtr had arrived at [employer] residence and had medications to deliver to pt as per pt's current medication list. Pt agreed to meet with wtr and welcomed wtr into apartment. Wtr knocked on the front door of pt's s/o's residence shortly thereafter and pt answered and welcomed wtr in. Wtr and pt greeted one another. Wtr asked pt what had happened previous Monday when pt no-showed wtr to attend probation appointment as planned. Pt reported that his youngest  daughter Cleda Mccreedy "[name]" had missed the school bus and Renner had left to walk her to school. Wtr explained that [name]'s son, Suella Grove, had said that pt "must have already left" with no further explanation and wtr went to probation and met with pt's new PO, [name], in hopes of finding pt at probation. Wtr informed pt that his newly assigned PO had reported that pt had missed both of his probation appointments thus far. Wtr reviewed with pt that he had an upcoming court appearance with [name] on 12/18/2017 at 2pm and if reported that pt had not complied with both probation and [place] treatment that [name] had "promised" pt to serve 1 year.     Burlie reported that he had spent a previous day this week with [name] calling various CD treatment programs and had made an appointment for 12/16/2017 at [facility] detox and that [facility] would set up a bed afterward for inpatient. Pt stated that he had called 311 and "they gave me a list of places to call". Wtr stated appreciation for Zi doing so and asked if pt would like assistance. Pt confirmed that it would be helpful for ACT Team to transport pt from [name]'s residence on 12/16/2017 to [facility], pick-up 8:20am, appointment at [place] agreed to do so and reviewed importance for pt to follow-through. Pt agreed. Laroy asked wtr if wtr would like to meet new grand-daughter Luster Landsberg "Poohda" and stated that the baby's father ([name]'s son), Suella Grove, had "got picked up" and was now incarcerated. Wtr met Latanya Presser who was held by [employer]. Wtr wished congratulations to both [employer] and Shantanu for their new grand-daughter. Wtr and pt said goodbyes and reviewed plan for 12/16/2017 and confimed. Wtr left.  Major psychotic depression, recurrent- Primary ICD-10-CM: F33.3  ICD-9-CM: 296.34     PTSD (post-traumatic stress disorder) ICD-10-CM: F43.10  ICD-9-CM: 309.81     Alcohol abuse ICD-10-CM: F10.10  ICD-9-CM: 305.00           Interventions:  Collateral  contact with:  with pt's s/o [name] and new grandbaby Treasure AKA "Poohda"  Continued to develop therapeutic relationship  Motivational Interviewing  Reviewed and discussed Treatment Plan/Treatment Plan Review   Wtr delivered pt's medications as per current medication list    Current Treatment Plan   Created/Updated On 09/29/2017   Next Treatment Plan Due 03/29/2018         Plan:  Other planned interventions or recommendations: Pt reports that he has made an appointment for 12/16/2017 at [facility] detox and ACT Team will assist pt by transporting him from [name]'s (pt/s s/o) residence to [facility] detox.    NEXT APPT: 12/16/2017, as per ACT calendar      Danise Edge, LMSW

## 2017-12-16 ENCOUNTER — Ambulatory Visit: Payer: Medicaid Other | Attending: Psychiatry

## 2017-12-16 DIAGNOSIS — F323 Major depressive disorder, single episode, severe with psychotic features: Secondary | ICD-10-CM | POA: Insufficient documentation

## 2017-12-16 DIAGNOSIS — F333 Major depressive disorder, recurrent, severe with psychotic symptoms: Secondary | ICD-10-CM | POA: Insufficient documentation

## 2017-12-16 LAB — UNMAPPED LAB RESULTS
Basophil # (HT): 0 10 3/uL — NL (ref 0.0–0.2)
Basophil % (HT): 1 % — NL (ref 0–3)
Eosinophil # (HT): 0.1 10 3/uL — NL (ref 0.0–0.6)
Eosinophil % (HT): 1 % — NL (ref 0–5)
Hematocrit (HT): 41 % — NL (ref 40–52)
Hemoglobin (HGB) (HT): 13.7 g/dL — NL (ref 13.0–18.0)
Lymphocyte # (HT): 1 10 3/uL — NL (ref 1.0–4.8)
Lymphocyte % (HT): 26 % — NL (ref 15–45)
MCHC (HT): 33.8 g/dL — NL (ref 32.0–37.5)
MCV (HT): 98 fL — NL (ref 80–100)
Mean Corpuscular Hemoglobin (MCH) (HT): 33.2 pg — NL (ref 26.0–34.0)
Monocyte # (HT): 0.7 10 3/uL — NL (ref 0.1–1.0)
Monocyte % (HT): 18 % — ABNORMAL HIGH (ref 0–15)
Neutrophil # (HT): 2.2 10 3/uL — NL (ref 1.8–8.0)
Platelets (HT): 148 10 3/uL — ABNORMAL LOW (ref 150–450)
RBC (HT): 4.13 10 6/uL — ABNORMAL LOW (ref 4.40–6.20)
RDW (HT): 12.9 % — NL (ref 0.0–15.2)
Seg Neut % (HT): 55 % — NL (ref 45–75)
WBC (HT): 4 10 3/uL — NL (ref 4.0–11.0)

## 2017-12-16 NOTE — Progress Notes (Signed)
Behavioral Health Progress Note     Length of Session: 80 minutes    Contact Type:  Location: Off Site    Collateral Contact, Face to Face       Problem(s)/Goals Addressed from Treatment Plan:  Treatment Problem #1 03/27/2017, continued from 09/29/2017   Patient Identified Problem Alc/drugs and instability at pt's s/o's and mother's residences       Treatment Goal #1 03/27/2017, continued from 09/29/2017   Patient Identified Goal "I will get my own apartment asap".      Progress toward goal(s): N/A - New goal    1 a. Measurable Objectives : Derek Walker will work with ACT Team at least 1x a month to form a Barrister's clerk, check-in with ongoing management of finances throughout month, and to note any adjustments that may need to be made after reviewing the month's income and overall expenses.  Date established: 03/27/2017 and continued on from this date 09/29/2017  Target date: 03/29/2018  Attained or Revised? Revised    1 b. Measurable Objectives :[name] will meet with ACT staff at least twice monthly to discuss and receive assistance in searching for safe and affordable housing, scheduling tours, making phone calls, completing applications, etc. As demonstrated by [name] having a safe and affordable place to live in the next 6 months.  Date established:09/02/2016 and continued on from this date 09/29/2017  Target date: 03/29/2018  Attained or Revised? Revised    1 c. Measurable Objectives : [name] will continue to utilize clinical appointments with the ACT Team to address communication and conflict with s/o, pt's mother, and other family members to assist pt in establishing healthy boundary lines and communication styles  Date established: 03/27/2017 and continued on from this date 09/29/2017  Target date: 03/29/2018  Attained or Revised?  Revised      ..........................................................................................................................................    Treatment Problem #2 03/27/2017, continued from 09/29/2017   Patient Identified Problem poor physical and mental health       Treatment Goal #2 03/27/2017, continued from 09/29/2017   Patient Identified Goal "I will get my health back in order (attending medical and clinical appointments, nutrition/weight)".       Progress toward goal(s): No change. Comment: Pt has had brief interludes of sobriety and attending clinical appointments but has continued to struggle to attend medical appointments and this continued avoidance has become apoint of excessive anxiety and stress for pt due to ongoing health concerns that are unaddressed.Pt has continued to move towards his goal of successfully completing outpatient CD treatment at [facility]. Pt wasin Phase II of treatment at [facility] but as of this date 09/29/2017 has been terminated due to lack of attendance.      2a. Measurable Objectives : I will [name]  Date established: 09/02/16  Target date: 09/27/2017  Attained or Revised? Discontinued, see revised objective 2aa    2aa. Measurable Objective: [name] will address his chemical dependency at the level of care that his sentencing [name] sees appropriate.  Date established: 09/29/2017  Target date: 03/29/2018  Attained or Revised? new    2b. Measurable Objectives : [name] will discuss barriers to attending medical and clinical appointments and work with ACT Team to overcome these challenges to increase pt's compliance with treatment recommendation at least 1x a month.  Date established: 03/27/2017 and continued  09/29/2017  Target date: 03/29/2018  Attained or Revised? Revised    2c. Measurable Objectives : [name] will continue  to explore and discuss cognitive dissonance between continued use of alc and maintaining sobriety and the effects of pt's alc use on his physical and mental health.  Date established: 03/27/2017 and continued 09/29/2017  Target date: 03/29/2018  Attained or Revised? Revised    Mental Status Exam:  APPEARANCE: Casual  ATTITUDE TOWARD INTERVIEWER: Cooperative  MOTOR ACTIVITY: WNL (within normal limits)  EYE CONTACT: Direct  SPEECH: Normal rate and tone  AFFECT: Anxious and Pleasant  MOOD: Anxious  THOUGHT PROCESS: Circumstantial, Goal directed and Tangential  THOUGHT CONTENT: Negative Rumination  PERCEPTION: No evidence of hallucinations  CURRENT SUICIDAL IDEATION: patient denies  CURRENT HOMICIDAL IDEATION: Patient denies  ORIENTATION: Alert and Oriented X 3.  CONCENTRATION: WNL  MEMORY:   Recent: intact   Remote: intact  COGNITIVE FUNCTION: Average intelligence  JUDGMENT: Intact  IMPULSE CONTROL: Good  INSIGHT: Fair    Risk Assessment:  ASSESSMENT OF RISK FOR SUICIDAL BEHAVIOR  Changes in risk for suicide from baseline Formulation of Risk and/or previous intake, including newly identified risk, if any: none    Session Content::  Wtr arrived at pt's s/o's residence ([name]) on [address] and pt was standing on the front porch wearing a light grey sweat suit and smoking a cigarette. Pt waved to wtr to gesture to come in side residence with pt. Wtr obliged and got out of wtr's work vehicle and greeted pt. Pt stated he was "all ready to go" to [facility] detox but needed to get his bag and say goodbyes to pt's s/o, daughter, and pt's s/o's granddaughter. Wtr went inside the residence with pt and stood by the front door. Wtr greeted [name], [place], and [place]. Pt said goodbyes with [name], Zeymanni, and Treasure. Wtr transported pt to [facility]. Pt's speech  was not slurred and seemed to have motor activity WNL, although wtr could smell alcohol on pt's breath.     Pt went into ED and stated that 311 had let him know he could come thru emergency to be admitted to alcohol detox. Wtr waited with pt in lobby until intake coordinator called pt's name. Pt spoke about his approximately 80 y/o daughter, stating he wished she would go to detox for her crack cocaine addiction. Pt spoke about how his Aunt (mother's sister) had died "from alcohol". Wtr reviewed with Shourya that he could be a role model for his children by attending detox and going to CD inpatient, as court ordered. Pt stated that he was "going to do it this time. I'm ready". Wtr provided support and motivational interviewing. Pt stated no safety concerns at this time. Wtr ensured that pt had wtr's work cell phone number and could give to intake coordinator as well. Pt was agreeable. Wtr and pt shook hands and wtr wished pt well and wtr left.     Visit Diagnosis:    Major psychotic depression, recurrent- Primary ICD-10-CM: F33.3  ICD-9-CM: 296.34     PTSD (post-traumatic stress disorder) ICD-10-CM: F43.10  ICD-9-CM: 309.81     Alcohol abuse ICD-10-CM: F10.10  ICD-9-CM: 305.00       Interventions:  Collateral contact with:  pt's s/o [name], pt's daughter Angela Cox (5 y/o) AKA "[name]), and pt's s/o's granddaughter Cyndia Skeeters (2 mos old) AKA "Poohda"  Continued to develop therapeutic relationship  Motivational Interviewing  Reviewed and discussed Treatment Plan/Treatment Plan Review    Current Treatment Plan   Created/Updated On 09/29/2017   Next Treatment Plan Due 03/29/2018       Plan:  Other planned interventions  or recommendations: Follow-up with pt at [facility] detox and/or inpatient.   Follow-up with pt's newly assigned probation officer, Sherrilyn RistBrooke Wilson and inform Wyvonne LenzJudge Yacknin of pt's admission to [facility] detox.    NEXT APPT: as per ACT calendar      Alinda MoneyShannon Phylis Javed, LMSW

## 2017-12-17 ENCOUNTER — Ambulatory Visit: Payer: Medicaid Other
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
## 2017-12-18 ENCOUNTER — Telehealth: Payer: Self-pay

## 2017-12-18 ENCOUNTER — Ambulatory Visit: Payer: Medicaid Other

## 2017-12-18 NOTE — Progress Notes (Signed)
Behavioral Health Progress Note     Length of Session: 80 minutes    Contact Type:  Location: Off Site    Face to Face     Problem(s)/Goals Addressed from Treatment Plan:  Treatment Problem #1 03/27/2017, continued from 09/29/2017   Patient Identified Problem Alc/drugs and instability at pt's s/o's and mother's residences       Treatment Goal #1 03/27/2017, continued from 09/29/2017   Patient Identified Goal "I will get my own apartment asap".      Progress toward goal(s): N/A - New goal    1 a. Measurable Objectives : Derek Walker will work with ACT Team at least 1x a month to form a Barrister's clerk, check-in with ongoing management of finances throughout month, and to note any adjustments that may need to be made after reviewing the month's income and overall expenses.  Date established: 03/27/2017 and continued on from this date 09/29/2017  Target date: 03/29/2018  Attained or Revised? Revised    1 b. Measurable Objectives :Derek Walker will meet with ACT staff at least twice monthly to discuss and receive assistance in searching for safe and affordable housing, scheduling tours, making phone calls, completing applications, etc. As demonstrated by Derek Walker having a safe and affordable place to live in the next 6 months.  Date established:09/02/2016 and continued on from this date 09/29/2017  Target date: 03/29/2018  Attained or Revised? Revised    1 c. Measurable Objectives : Derek Walker will continue to utilize clinical appointments with the ACT Team to address communication and conflict with s/o, pt's mother, and other family members to assist pt in establishing healthy boundary lines and communication styles  Date established: 03/27/2017 and continued on from this date 09/29/2017  Target date: 03/29/2018  Attained or Revised?  Revised      ..........................................................................................................................................    Treatment Problem #2 03/27/2017, continued from 09/29/2017   Patient Identified Problem poor physical and mental health       Treatment Goal #2 03/27/2017, continued from 09/29/2017   Patient Identified Goal "I will get my health back in order (attending medical and clinical appointments, nutrition/weight)".       Progress toward goal(s): No change. Comment: Pt has had brief interludes of sobriety and attending clinical appointments but has continued to struggle to attend medical appointments and this continued avoidance has become apoint of excessive anxiety and stress for pt due to ongoing health concerns that are unaddressed.Pt has continued to move towards his goal of successfully completing outpatient CD treatment at [facility]. Pt wasin Phase II of treatment at [facility] but as of this date 09/29/2017 has been terminated due to lack of attendance.      2a. Measurable Objectives : [facility]  Date established: 09/02/16  Target date: 09/27/2017  Attained or Revised? Discontinued, see revised objective 2aa    2aa. Measurable Objective: Derek Walker will address his chemical dependency at the level of care that his sentencing Derek Walker sees appropriate.  Date established: 09/29/2017  Target date: 03/29/2018  Attained or Revised? new    2b. Measurable Objectives : Derek Walker will discuss barriers to attending medical and clinical appointments and work with ACT Team to overcome these challenges to increase pt's compliance with treatment recommendation at least 1x a month.  Date established: 03/27/2017 and continued  09/29/2017  Target date: 03/29/2018  Attained or Revised? Revised    2c. Measurable Objectives : Derek Walker will continue to explore and discuss  cognitive dissonance between continued use of alc and maintaining sobriety and the effects of pt's alc use on his physical and mental health.  Date established: 03/27/2017 and continued 09/29/2017  Target date: 03/29/2018  Attained or Revised? Revised      Mental Status Exam:  APPEARANCE: Casual, Disheveled  ATTITUDE TOWARD INTERVIEWER: Cooperative  MOTOR ACTIVITY: WNL (within normal limits)  EYE CONTACT: Direct  SPEECH: Normal rate and tone and Slurred  AFFECT: Anxious and Pleasant  MOOD: Anxious and Depressed  THOUGHT PROCESS: Circumstantial, Goal directed and Tangential  THOUGHT CONTENT: Negative Rumination  PERCEPTION: No evidence of hallucinations  CURRENT SUICIDAL IDEATION: patient denies  CURRENT HOMICIDAL IDEATION: Patient denies  ORIENTATION: Alert and Oriented X 3.  CONCENTRATION: WNL  MEMORY:   Recent: intact   Remote: intact  COGNITIVE FUNCTION: Average intelligence  JUDGMENT: Impaired -  minimal  IMPULSE CONTROL: Fair  INSIGHT: Fair    Risk Assessment:  ASSESSMENT OF RISK FOR SUICIDAL BEHAVIOR  Changes in risk for suicide from baseline Formulation of Risk and/or previous intake, including newly identified risk, if any: none    Session Content::  Wtr picked up pt to [place] [facility] to be seen in front of [employer] ResourcesJudge Yacknin. Pt smelled of alcohol but spoke clearly and motor activity was WNL. Pt reported that he was anxious to attend court and explained to wtr that referral was made on 12/17/2017 for [place]. Wtr shared that [place] [facility] that pt would first need permission from pt's PO and would lose ACT services. Wtr called [facility] to see if a bed was available and was able to complete screening process over the phone. [facility] confirmed with  wtr that pt could have a bed and wtr transported pt to detox. Wtr assisted pt in completing paperwork and [facility] staff stated that pt would be admitted. Wtr and pt said goodbyes and wtr left.    Wtr called pt's newly assigned PO, Sherrilyn RistBrooke Wilson, and notified her of [facility]. Wtr went to court and left a letter for Derek LenzJudge Yacknin with update from ACT Team re: pt's treatment and compliance.     Visit Diagnosis:    Major psychotic depression, recurrent- Primary ICD-10-CM: F33.3  ICD-9-CM: 296.34     PTSD (post-traumatic stress disorder) ICD-10-CM: F43.10  ICD-9-CM: 309.81     Alcohol abuse ICD-10-CM: F10.10  ICD-9-CM: 305.00       Interventions:  Continued to develop therapeutic relationship  Identified adaptive/maladaptive family patterns  Motivational Interviewing  Provided therapeutic support during discussion of distressing/traumatic events and/or symptoms  Referral to community organization: [facility] detox  Supportive Psychotherapy    Current Treatment Plan   Created/Updated On 09/29/2017   Next Treatment Plan Due 03/29/2018       Plan:  Other planned interventions or recommendations: ACT Team will located pt's Rx as per current medication list and deliver to [facility]    NEXT APPT: as per ACT calendar      Alinda MoneyShannon Chamille Werntz, LMSW

## 2017-12-18 NOTE — Telephone Encounter (Signed)
Writer called all 3 pharmacy's where pt gets his psych/medical meds ( [facility]) to see when meds were last dispensed.  ACT team aware.       Psych meds from [facility] are not due till next Wednesday and some not till 4/26.  Atripla last dispensed 6/18 with apparently 3 refills left [facility] [address].  Other [facility] last dispensed 1/19 with no refills.

## 2017-12-19 ENCOUNTER — Ambulatory Visit: Payer: Medicaid Other

## 2017-12-22 NOTE — Progress Notes (Signed)
Behavioral Health Progress Note     Length of Session: 25 minutes    Contact Type:  Location: Off Site    Collateral Contact         Session Content::  Wtr went to pt's s/o's residence ([name]) and pt's mother's residence [name](Deborah) and spoke with both Rosario AdieViolet, Deborah, and Lenn[employer] Corporationpt's Uncle Horris. Wtr collected pt's medications and delivered to [facility].       Interventions:  Collateral contact with:  pt's mother Maurine MinisterDeborah, Uncle Horris, and pt's s/o [name]      Plan:  Other planned interventions or recommendations: [facility] to coordinate pt's bed-to-bed from detox to inpatient   F/u with [facility] provider per prescribed medication, due to pt's non-compliance, possibly drop from medication list until pt is able to be seen by ID clinic provider    NEXT APPT: as per ACT calendar      Alinda MoneyShannon Preslyn Warr, LMSW

## 2017-12-24 ENCOUNTER — Encounter: Payer: Self-pay | Admitting: Gastroenterology

## 2017-12-25 LAB — UNMAPPED LAB RESULTS
Basophil # (HT): 0 10 3/uL — NL (ref 0.0–0.2)
Basophil % (HT): 1 % — NL (ref 0–2)
Eosinophil # (HT): 0.1 10 3/uL — NL (ref 0.0–0.5)
Eosinophil % (HT): 2 % — NL (ref 0–7)
Hematocrit (HT): 44 % — NL (ref 40–52)
Hemoglobin (HGB) (HT): 14.9 g/dL — NL (ref 13.0–17.5)
Lymphocyte # (HT): 2.4 10 3/uL — NL (ref 0.9–3.8)
Lymphocyte % (HT): 41 % — NL (ref 17–44)
MCHC (HT): 33.6 g/dL — NL (ref 32.0–36.0)
MCV (HT): 99 fL — NL (ref 81–99)
Mean Corpuscular Hemoglobin (MCH) (HT): 33.3 pg — NL (ref 26.0–34.0)
Monocyte # (HT): 0.9 10 3/uL — NL (ref 0.2–1.0)
Monocyte % (HT): 15 % — NL (ref 4–15)
Neutrophil # (HT): 2.4 10 3/uL — NL (ref 1.5–7.7)
Platelets (HT): 165 10 3/uL — NL (ref 140–400)
RBC (HT): 4.47 10 6/uL — ABNORMAL LOW (ref 4.60–6.20)
RDW (HT): 12.8 % — NL (ref 11.5–15.0)
Seg Neut % (HT): 41 % — ABNORMAL LOW (ref 43–75)
WBC (HT): 5.7 10 3/uL — NL (ref 4.0–10.0)

## 2017-12-26 ENCOUNTER — Ambulatory Visit: Payer: Medicaid Other

## 2017-12-29 ENCOUNTER — Ambulatory Visit: Payer: Medicaid Other

## 2017-12-30 ENCOUNTER — Other Ambulatory Visit: Payer: Self-pay | Admitting: Psychiatry

## 2017-12-30 NOTE — Progress Notes (Signed)
Behavioral Health Progress Note     Length of Session: 60 minutes    Contact Type:  Location: Off Site    Complex - Other Clinical Participants: pt's counselor at [place], [place], Face to Face     Problem(s)/Goals Addressed from Treatment Plan:  Treatment Problem #1 03/27/2017, continued from 09/29/2017   Patient Identified Problem Alc/drugs and instability at pt's s/o's and mother's residences       Treatment Goal #1 03/27/2017, continued from 09/29/2017   Patient Identified Goal "I will get my own apartment asap".      Progress toward goal(s): N/A - New goal    1 a. Measurable Objectives : Keavon will work with ACT Team at least 1x a month to form a Artist, check-in with ongoing management of finances throughout month, and to note any adjustments that may need to be made after reviewing the month's income and overall expenses.  Date established: 03/27/2017 and continued on from this date 09/29/2017  Target date: 03/29/2018  Attained or Revised? Revised    1 b. Measurable Objectives :Chadrick will meet with ACT staff at least twice monthly to discuss and receive assistance in searching for safe and affordable housing, scheduling tours, making phone calls, completing applications, etc. As demonstrated by [name] having a safe and affordable place to live in the next 6 months.  Date established:09/02/2016 and continued on from this date 09/29/2017  Target date: 03/29/2018  Attained or Revised? Revised    1 c. Measurable Objectives : Gabrian will continue to utilize clinical appointments with the ACT Team to address communication and conflict with s/o, pt's mother, and other family members to assist pt in establishing healthy boundary lines and communication styles  Date established: 03/27/2017 and continued on from this date 09/29/2017  Target date: 03/29/2018  Attained or Revised?  Revised      ..........................................................................................................................................    Treatment Problem #2 03/27/2017, continued from 09/29/2017   Patient Identified Problem poor physical and mental health       Treatment Goal #2 03/27/2017, continued from 09/29/2017   Patient Identified Goal "I will get my health back in order (attending medical and clinical appointments, nutrition/weight)".       Progress toward goal(s): No change. Comment: Pt has had brief interludes of sobriety and attending clinical appointments but has continued to struggle to attend medical appointments and this continued avoidance has become apoint of excessive anxiety and stress for pt due to ongoing health concerns that are unaddressed.Pt has continued to move towards his goal of successfully completing outpatient CD treatment at [facility]. Pt wasin Phase II of treatment at [facility] but as of this date 09/29/2017 has been terminated due to lack of attendance.      2a. Measurable Objectives : I will successfully complete [place] [facility] Dependency treatment  Date established: 09/02/16  Target date: 09/27/2017  Attained or Revised? Discontinued, see revised objective 2aa    2aa. Measurable Objective: Derek will address his chemical dependency at the level of care that his sentencing [name] sees appropriate.  Date established: 09/29/2017  Target date: 03/29/2018  Attained or Revised? new    2b. Measurable Objectives : Khaidyn will discuss barriers to attending medical and clinical appointments and work with ACT Team to overcome these challenges to increase pt's compliance with treatment recommendation at least 1x a month.  Date established: 03/27/2017 and continued  09/29/2017  Target date: 03/29/2018  Attained or Revised? Revised  2c. Measurable Objectives : Chijioke will continue to explore and discuss cognitive dissonance between continued use of alc and maintaining sobriety and the effects of pt's alc use on his physical and mental health.  Date established: 03/27/2017 and continued 09/29/2017  Target date: 03/29/2018  Attained or Revised? Revised      Mental Status Exam:  APPEARANCE: Well-groomed, Casual  ATTITUDE TOWARD INTERVIEWER: Cooperative  MOTOR ACTIVITY: WNL (within normal limits)  EYE CONTACT: Direct  SPEECH: Normal rate and tone  AFFECT: Flat, Anxious and Pleasant  MOOD: Anxious  THOUGHT PROCESS: Circumstantial and Goal directed  THOUGHT CONTENT: Negative Rumination  PERCEPTION: Within normal limits and No evidence of hallucinations  CURRENT SUICIDAL IDEATION: patient denies  CURRENT HOMICIDAL IDEATION: Patient denies  ORIENTATION: Alert and Oriented X 3.  CONCENTRATION: WNL  MEMORY:   Recent: intact   Remote: intact  COGNITIVE FUNCTION: Average intelligence  JUDGMENT: Impaired -  minimal  IMPULSE CONTROL: Fair  INSIGHT: Fair    Risk Assessment:  ASSESSMENT OF RISK FOR SUICIDAL BEHAVIOR  Changes in risk for suicide from baseline Formulation of Risk and/or previous intake, including newly identified risk, if any: none    Session Content::  Wtr arrived at [facility] inpatient and Melanie Crazier, pt's counselor met wtr in the lobby. Wtr and Al [employer] and wtr signed release. Wtr, Al Knight, and pt all met in Al's office. Al Danelle Earthly stated that pt's discharge date was 01/07/2018 and pt would receive referral to [facility] CD outpatient within 48 hours. Wtr explained that ACT Team was identifying how pt may keep [place] outpatient services with ACT Team while attending CD outpatient simultaneously and identifying alternatives as billing insurance for these two services are under the same service category and  one would most likely not be covered. Pt received well and was open to alternatives and wtr stated that wtr would keep pt and Al Knight informed.     Al went to a staff meeting and wtr stayed with pt to discuss pt's inpatient treatment thus far and plan for outpatient. Pt stated his intent to remain abstinent from drug/alc although had little insight stating that others drinking/using drug/alc around him would not be a trigger for him "as long as I'm sober". Wtr prompted pt to remain open-minded about what may be a trigger for pt and to consider coping skills to utilize in order to maintain abstinence in the face of a trigger/craving. Pt received well. Wtr stated that wtr had spoken with pt's PO [name] who was aware that pt had attended detox and had been referred to inpatient.     Pt stated that he had not talked to his s/o [name] every day as he was trying to "focus on me". Pt stated no safety concerns at this time. Wtr and pt said goodbyes and knocked on staff room door for Al to open locked doors to walk wtr out to lobby. Wtr thanked [employer] and left.     Visit Diagnosis:    Major psychotic depression, recurrent- Primary ICD-10-CM: F33.3  ICD-9-CM: 296.34     PTSD (post-traumatic stress disorder) ICD-10-CM: F43.10  ICD-9-CM: 309.81     Alcohol abuse ICD-10-CM: F10.10  ICD-9-CM: 305.00       Interventions:  Collateral contact with:  pt's counselor at [place], [place]  Provided therapeutic support during discussion of distressing/traumatic events and/or symptoms  Resource information given for:  [facility]  Reviewed and discussed Treatment Plan/Treatment  Plan Review  Supportive Psychotherapy    Current Treatment Plan   Created/Updated On 09/29/2017   Next Treatment Plan Due 03/29/2018         Plan:  Other planned interventions or recommendations: Wtr has consulted ACT Team leader Leane Para, [facility] and Cammy Brochure, LCSW-R (social work  Librarian, academic) and have identified that [place] through [facility] would be a more appropriate level of care for pt to continue CD treatment 4 days a week, rather than 1 IDDT group and 1 one-on-one and integrated services throughout the week with ACT Team. Pt is unable to receive ACT Team and CD outpatient tx simultaneously, pt will begin discharge process from ACT Team, and make referrals to both [facility] outpatient clinic and referral for case management at [facility]. Pt is scheduled to be discharged from [facility] on 01/07/2018 and will be scheduled from CD outpatient appointment through [facility] within 48 hours of discharge. Wtr will follow-up with ID clinic to reschedule missed appointments. Wtr will call [employer] to cancel pt's referral as it was identified that it was inappropriate level of care for pt for outpatient CD treatment.     NEXT APPT: as per ACT calendar      Danise Edge, LMSW

## 2017-12-30 NOTE — Progress Notes (Signed)
[facility] MISSED/CANCELLED APPOINTMENT     Name: Derek NielsenRALPH D Strout  MRN: [id number]   DOB: [birth year]    Date of Scheduled Service: 12/26/2017    Mr. Ueda has rescheduled today's appointment.  I have spoken with the patient by phone.  The patient stated the reason for today's absence is [facility] did not have release on file for ACT Team, and pt did not meet with counselor Remi DeterAl Knight until it was too late in the day for ACT to visit.    Additional Information:    Wtr spoke with pt and pt's counselor, Remi DeterAl Knight at [facility]. Wtr arranged to meet with pt and [facility]'s [facility] on 12/29/2017 to coordinate pt's care and discharge plan       Alinda MoneyShannon Tennelle Taflinger, LMSW

## 2018-01-07 ENCOUNTER — Ambulatory Visit: Payer: Medicaid Other

## 2018-01-08 ENCOUNTER — Ambulatory Visit: Payer: Medicaid Other

## 2018-01-08 NOTE — Progress Notes (Signed)
[facility] MISSED/CANCELLED APPOINTMENT     Name: Derek Walker  MRN: [id number]   DOB: April 04, 1976    Date of Scheduled Service: 01/07/2018    [name] has rescheduled today's appointment.  I have spoken with the patient by phone.  The patient stated the reason for today's absence is he was at the pharmacy with his sister picking up medications per pt's current medication list..    Additional Information:    Wtr informed pt that wtr was going to deliver medications to pt on this date and pt was agreeable to meeting 01/08/2018 at 10am for wtr to deliver.     Alinda MoneyShannon Hattie Pine, LMSW

## 2018-01-09 ENCOUNTER — Ambulatory Visit: Payer: Medicaid Other

## 2018-01-09 ENCOUNTER — Encounter: Payer: Self-pay | Admitting: Gastroenterology

## 2018-01-09 DIAGNOSIS — F333 Major depressive disorder, recurrent, severe with psychotic symptoms: Secondary | ICD-10-CM
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
## 2018-01-12 ENCOUNTER — Ambulatory Visit: Payer: Medicaid Other

## 2018-01-12 NOTE — Progress Notes (Signed)
[facility] ACT Team Clinical Management Note     Date of service: 01/12/2018  Duration: 5 minutes  Type of visit: Collateral    Wtr spoke with pt's mother, [name], who stated that Deovion had called her and stated that he would be adjourned for next court appearance 01/21/2018. [name] also reported that Reinaldo said that [name] did not get documentation of his successful completion of detox and inpatient for CD treatment that wtr had faxed to [facility] [place] clerk, Olegario Messier on 01/08/2018 as well as a left a voicemail. Wtr agreed to call [facility] [place] clerk again and to identify pt's public defender in hopes of resolving before pt's next court appearance-- as pt was technically in compliance with [place] court order by entering detox the day that he was last scheduled to be seen by [name], completed detox successfully, and completed CD inpatient successfully. Wtr agreed to keep pt's mother updated.    [name], LMSW

## 2018-01-12 NOTE — Progress Notes (Signed)
Behavioral Health Progress Note     Length of Session: 45 minutes    Contact Type:  Location: Off Site    Brief Face to Face     Problem(s)/Goals Addressed from Treatment Plan:    Problem 2:    Treatment Problem #2 02/21/2016   Patient Identified Problem undereducated       Goal for this problem:    Treatment Goal #2 02/21/2016   Patient Identified Goal "I will apply to go back to school within six months".        Progress towards this goal: No change. Comment: there is no change in this goal.       Mental Status Exam:  APPEARANCE: Casual  ATTITUDE TOWARD INTERVIEWER: Cooperative  MOTOR ACTIVITY: WNL (within normal limits)  EYE CONTACT: Direct  SPEECH: Normal rate and tone  AFFECT: Neutral  MOOD: Neutral  THOUGHT PROCESS: Normal  THOUGHT CONTENT: No unusual themes  PERCEPTION: Within normal limits  CURRENT SUICIDAL IDEATION: patient denies  CURRENT HOMICIDAL IDEATION: Patient denies  ORIENTATION: Alert and Oriented X 3.  CONCENTRATION: Fair  MEMORY:   Recent: intact   Remote: intact  COGNITIVE FUNCTION: Average intelligence  JUDGMENT: Intact  IMPULSE CONTROL: Fair  INSIGHT: Fair    Risk Assessment:  ASSESSMENT OF RISK FOR SUICIDAL BEHAVIOR  Changes in risk for suicide from baseline Formulation of Risk and/or previous intake, including newly identified risk, if any: none    Session Content::      Writer met with Derek Walker for an individual session. He was at his mother's house and Probation officer picked him up to go to probation. He reported that he had been doing well with abstinence following inpatient. He feels he is done at this time and is ready to go to outpatient programing. He stated he has not met his new probation officer, but it ready to face the fact that he may be violated as [name] court had not received his paperwork on his completion of inpatient. Writer handed Derek Walker an extra copy of this paper work for his Engineer, manufacturing systems.     Derek Walker was compliant and cooperative during this session. His warrant was still  active and he was taken into custody upon arrival at probation. His officer explained the situation a bit and bid Architect.     Visit Diagnosis:      ICD-10-CM ICD-9-CM   1. Major psychotic depression, recurrent F33.3 296.34       Interventions:  Continued to develop therapeutic relationship    Current Treatment Plan   Created/Updated On 02/21/2016   Next Treatment Plan Due 08/22/2016         Plan:  Psychotherapy continues as described in care plan; plan remains the same.    NEXT APPT: Continue with ACT Team Schedule. Patient should be incarcerated through til Monday.       Pamala Duffel, MSW

## 2018-01-15 ENCOUNTER — Ambulatory Visit: Payer: Medicaid Other | Attending: Psychiatry

## 2018-01-15 ENCOUNTER — Ambulatory Visit: Payer: Medicaid Other | Admitting: Infectious Diseases

## 2018-01-15 DIAGNOSIS — F323 Major depressive disorder, single episode, severe with psychotic features: Secondary | ICD-10-CM | POA: Insufficient documentation

## 2018-01-15 DIAGNOSIS — Z789 Other specified health status: Secondary | ICD-10-CM | POA: Insufficient documentation

## 2018-01-15 DIAGNOSIS — F333 Major depressive disorder, recurrent, severe with psychotic symptoms: Secondary | ICD-10-CM | POA: Insufficient documentation

## 2018-01-15 DIAGNOSIS — F4310 Post-traumatic stress disorder, unspecified: Secondary | ICD-10-CM | POA: Insufficient documentation

## 2018-01-21 ENCOUNTER — Ambulatory Visit: Payer: Medicaid Other

## 2018-01-23 ENCOUNTER — Ambulatory Visit: Payer: Medicaid Other | Admitting: Infectious Diseases

## 2018-01-23 NOTE — Progress Notes (Signed)
Behavioral Health Progress Note     Length of Session: 60 minutes    Contact Type:  Location: Off Site in [employer]    Face to Face, Collateral Contact with pt's public defender, Kathe Becton as well as pt's s/o [name]    Problem(s)/Goals Addressed from Treatment Plan:  Treatment Problem #1 03/27/2017, continued from 09/29/2017   Patient Identified Problem Alc/drugs and instability at pt's s/o's and mother's residences       Treatment Goal #1 03/27/2017, continued from 09/29/2017   Patient Identified Goal "I will get my own apartment asap".      Progress toward goal(s): N/A - New goal    1 a. Measurable Objectives : Jahid will work with ACT Team at least 1x a month to form a Barrister's clerk, check-in with ongoing management of finances throughout month, and to note any adjustments that may need to be made after reviewing the month's income and overall expenses.  Date established: 03/27/2017 and continued on from this date 09/29/2017  Target date: 03/29/2018  Attained or Revised? Revised    1 b. Measurable Objectives :Janai will meet with ACT staff at least twice monthly to discuss and receive assistance in searching for safe and affordable housing, scheduling tours, making phone calls, completing applications, etc. As demonstrated by [name] having a safe and affordable place to live in the next 6 months.  Date established:09/02/2016 and continued on from this date 09/29/2017  Target date: 03/29/2018  Attained or Revised? Revised    1 c. Measurable Objectives : Moua will continue to utilize clinical appointments with the ACT Team to address communication and conflict with s/o, pt's mother, and other family members to assist pt in establishing healthy boundary lines and communication styles  Date established: 03/27/2017 and continued on from this date 09/29/2017  Target date:  03/29/2018  Attained or Revised? Revised      ..........................................................................................................................................    Treatment Problem #2 03/27/2017, continued from 09/29/2017   Patient Identified Problem poor physical and mental health       Treatment Goal #2 03/27/2017, continued from 09/29/2017   Patient Identified Goal "I will get my health back in order (attending medical and clinical appointments, nutrition/weight)".       Progress toward goal(s): No change. Comment: Pt has had brief interludes of sobriety and attending clinical appointments but has continued to struggle to attend medical appointments and this continued avoidance has become apoint of excessive anxiety and stress for pt due to ongoing health concerns that are unaddressed.Pt has continued to move towards his goal of successfully completing outpatient CD treatment at [facility]. Pt wasin Phase II of treatment at [facility] but as of this date 09/29/2017 has been terminated due to lack of attendance.      2a. Measurable Objectives : [name]  Date established: 09/02/16  Target date: 09/27/2017  Attained or Revised? Discontinued, see revised objective 2aa    2aa. Measurable Objective: Deborah will address his chemical dependency at the level of care that his sentencing [name] sees appropriate.  Date established: 09/29/2017  Target date: 03/29/2018  Attained or Revised? new    2b. Measurable Objectives : Zeven will discuss barriers to attending medical and clinical appointments and work with ACT Team to overcome these challenges to increase pt's compliance with treatment recommendation at least 1x a month.  Date  established: 03/27/2017 and continued 09/29/2017  Target date: 03/29/2018  Attained or Revised? Revised    2c. Measurable Objectives : Roberts will continue to explore and discuss cognitive dissonance between continued use of alc and maintaining sobriety and the effects of pt's alc use on his physical and mental health.  Date established: 03/27/2017 and continued 09/29/2017  Target date: 03/29/2018  Attained or Revised? Revised      Mental Status Exam:  APPEARANCE: Well-groomed, Casual  ATTITUDE TOWARD INTERVIEWER: Cooperative  MOTOR ACTIVITY: WNL (within normal limits)  EYE CONTACT: Direct and Indirect  SPEECH: Soft and Minimal  AFFECT: Anxious and Pleasant  MOOD: Anxious  THOUGHT PROCESS: Circumstantial  THOUGHT CONTENT: No unusual themes  PERCEPTION: No evidence of hallucinations  CURRENT SUICIDAL IDEATION: patient denies  CURRENT HOMICIDAL IDEATION: Patient denies  ORIENTATION: Alert and Oriented X 3.  CONCENTRATION: WNL  MEMORY:   Recent: intact   Remote: intact  COGNITIVE FUNCTION: Average intelligence  JUDGMENT: Intact  IMPULSE CONTROL: Good  INSIGHT: Good    Risk Assessment:  ASSESSMENT OF RISK FOR SUICIDAL BEHAVIOR  Changes in risk for suicide from baseline Formulation of Risk and/or previous intake, including newly identified risk, if any: none    Session Content::  Wtr arrived at [employer] and asked CO to speak with Arts administrator. Wtr and pt's s/o [name] saw one another and greeted each other quietly. Wtr stepped into the hallway with public defender, Kathe Becton, and provided documentation of pt's recent CD and mental health treatment. See Interventions for exact dates and information provided. Pt's public defender was appreciative and stated, "Thank you for all of this. We'll get him out today". Wtr and Arts administrator re-entered court room and wtr sat down quietly next to pt's s/o, [name]. Court was in session, although pt  was not brought up from [facility] yet at this time. Wtr quietly shared with [name] that documentation was provided to [employer] and just hand-delivered to [employer] and that Dorene Grebe had stated confidence that she would attempt to have pt released from custody on this date. [name] hugged wtr and stated, "Thank you Jesus".     Pt was brought up from [facility] and sat in the back hallway with [employer]. Pt was brought in front of [employer] shortly thereafter. Pt's public defender provided documentation that wtr had supplied. [name] stated, "Who is this person?" and Kathe Becton stated, "[name] is pt's primary therapist on [facility], your Honor" and [name] stated, "How can she say that Mr. Manseau is in compliance with probation when I have contradictory reports from Mr. Melvyn Neth' probation stating that he has absconded and not attended his appointments?" [name] asked [employer], "When was the last time you saw probation and who is your probation officer?" Sang stated, "Well, my probation officer was Sonda Primes and I had been leaving her messages" and [name] stated, "I'm not asking when you left messages I'm asking when you last saw your probation officer and she is your probation officer." At this time wtr would have provided information that pt's probation officer is in fact Sherrilyn Rist and that pt had last seen her, for the first time on 01/09/2018, when taken into custody and that pt was rescheduled to complete orientation with his newly assigned PO on 01/23/2018, if released from custody.     [name] stated that she had not only issued a bench warrant for pt's arrest on 12/18/2017 for not appearing to court, but also for continuously not reporting to  probation. [name] stated that there was no evidence that pt was admitted to "CD Inpatient on 12/18/2017" as her clerk has received a fax stating that pt was admitted to "CD Inpatient on 12/24/2017".  If wtr was allowed to speak in courtroom, wtr would have provided information that pt was in fact admitted to CD Detox at [facility] on 12/18/2017 and that wtr informed pt's probation officer of this as well as provided pt's PO with pt's counselor Marlou Sa at [facility] contact information so that could confirm pt's admission. Pt successfully completed detox and was directly transferred on 12/24/2017 to [facility] CD Inpatient treatment.     [name] stated that Teofil had a "long criminal record" and held up pt's paperwork showing width of paperwork and stated that she would keep pt in custody until seen in front of [name] on 01/27/2018 as an official transfer to Drug Court for the "help that Mr. Dagher needs". [name] stated that "people with longer records than yours have done well in Drug Court, and I hope that you can do the same, and if you want to inform me of when that happens, I'd be happy to come shake your hand on the day of your graduation. But you need to attend and comply with probation". Travius agreed to these terms.     Rudolf was brought back into custody. Wtr left courtroom with pt's s/o [name] who walked into the lobby and began crying. Wtr provided support and validation and reviewed with [name] that wtr would advocate for pt to be transferred to Mental Health Court which was designed to better serve the needs of individuals similar to [place], with both chemical dependency and mental health diagnoses. [name] received well and thanked wtr. Wtr and [name] said goodbyes and [name] left. Wtr left shortly thereafter.     Visit Diagnosis:    Major psychotic depression, recurrent- Primary ICD-10-CM: F33.3  ICD-9-CM: 296.34     PTSD (post-traumatic stress disorder) ICD-10-CM: F43.10  ICD-9-CM: 309.81     Alcohol abuse ICD-10-CM: F10.10  ICD-9-CM: 305.00         Interventions:  Collateral contact with:  pt's public defender, Kathe Becton as well as with pt's s/o [name]  Continued to  develop therapeutic relationship  Wtr provided support and review of pt's treatment plan with pt's s/o [name]   Wtr provided documentation of pt's admittance to detox on 12/18/17 as well as successful completion and transfer on 12/24/17 to [facility] CD Inpatient. Wtr provided documentation of pt's attempts to be admitted to CD detox on 12/16/17 and 12/17/17. Wtr provided information that wtr had been in contact with pt's PO and that pt had gone in for orientation with newly assigned PO Brooken Wilson on 01/09/18 knowing that there was a bench warrant for his arrest and risked being taken into custody as pt was agreeable and motivated to be in full compliance with [place] court.  -Wtr was unable to speak in [place] court due to lack of accessibility to clinical representation and henceforth provided written documentation for both [name] and pt's public defender to advocate for pt's release from custody on this date.  Wtr rescheduled pt's ID clinic appointment and probation appointment as pt would not be released from custody and unable to attend until after 01/27/2018.    Current Treatment Plan   Created/Updated On 09/29/2017   Next Treatment Plan Due 03/29/2018       Plan:  Other planned interventions or  recommendations: Wtr coordinating with Vira Browns and Rodena Medin to attempt to transfer pt to Mental Health Court to better coordinate with court system and probation, with more accessibility to clinical representation, to better serve and advocate for [place] per his acute needs   01/26/2018 Wtr will meet with pt at [facility] to review treatment plan  01/27/2018 Wtr will attend pt's scheduled 1st court appearance in [place] Court to clinically recommend that pt be transferred to Mental Health Court (also [name]), pt scheduled to be released from custody on this date.     NEXT APPT: 01/26/18, as per ACT calendar      [name], LMSW

## 2018-01-26 ENCOUNTER — Ambulatory Visit: Payer: Medicaid Other

## 2018-01-26 NOTE — Progress Notes (Signed)
Behavioral Health Progress Note     Length of Session: 45 minutes    Contact Type:  Location: Off Site, [facility]    Face to Face     Problem(s)/Goals Addressed from Treatment Plan:  Treatment Problem #1 03/27/2017, continued from 09/29/2017   Patient Identified Problem Alc/drugs and instability at pt's s/o's and mother's residences       Treatment Goal #1 03/27/2017, continued from 09/29/2017   Patient Identified Goal "I will get my own apartment asap".      Progress toward goal(s): N/A - New goal    1 a. Measurable Objectives : Derek Walker will work with ACT Team at least 1x a month to form a Artist, check-in with ongoing management of finances throughout month, and to note any adjustments that may need to be made after reviewing the month's income and overall expenses.  Date established: 03/27/2017 and continued on from this date 09/29/2017  Target date: 03/29/2018  Attained or Revised? Revised    1 b. Measurable Objectives :[name] will meet with ACT staff at least twice monthly to discuss and receive assistance in searching for safe and affordable housing, scheduling tours, making phone calls, completing applications, etc. As demonstrated by [name] having a safe and affordable place to live in the next 6 months.  Date established:09/02/2016 and continued on from this date 09/29/2017  Target date: 03/29/2018  Attained or Revised? Revised    1 c. Measurable Objectives : [name] will continue to utilize clinical appointments with the ACT Team to address communication and conflict with s/o, pt's mother, and other family members to assist pt in establishing healthy boundary lines and communication styles  Date established: 03/27/2017 and continued on from this date 09/29/2017  Target date: 03/29/2018  Attained or Revised?  Revised      ..........................................................................................................................................    Treatment Problem #2 03/27/2017, continued from 09/29/2017   Patient Identified Problem poor physical and mental health       Treatment Goal #2 03/27/2017, continued from 09/29/2017   Patient Identified Goal "I will get my health back in order (attending medical and clinical appointments, nutrition/weight)".       Progress toward goal(s): No change. Comment: Pt has had brief interludes of sobriety and attending clinical appointments but has continued to struggle to attend medical appointments and this continued avoidance has become apoint of excessive anxiety and stress for pt due to ongoing health concerns that are unaddressed.Pt has continued to move towards his goal of successfully completing outpatient CD treatment at [facility]. Pt wasin Phase II of treatment at [facility] but as of this date 09/29/2017 has been terminated due to lack of attendance.      2a. Measurable Objectives : I will successfully complete [facility] Chemical Dependency treatment  Date established: 09/02/16  Target date: 09/27/2017  Attained or Revised? Discontinued, see revised objective 2aa    2aa. Measurable Objective: [name] will address his chemical dependency at the level of care that his sentencing [name] sees appropriate.  Date established: 09/29/2017  Target date: 03/29/2018  Attained or Revised? new    2b. Measurable Objectives : [name] will discuss barriers to attending medical and clinical appointments and work with ACT Team to overcome these challenges to increase pt's compliance with treatment recommendation at least 1x a month.  Date established: 03/27/2017 and continued  09/29/2017  Target date: 03/29/2018  Attained or Revised? Revised    2c. Measurable Objectives : [name] will continue to  explore and discuss cognitive dissonance between continued use of alc and maintaining sobriety and the effects of pt's alc use on his physical and mental health.  Date established: 03/27/2017 and continued 09/29/2017  Target date: 03/29/2018  Attained or Revised? Revised      Mental Status Exam:  APPEARANCE: Casual  ATTITUDE TOWARD INTERVIEWER: Cooperative  MOTOR ACTIVITY: WNL (within normal limits)  EYE CONTACT: Direct  SPEECH: Normal rate and tone  AFFECT: Anxious and Pleasant  MOOD: Anxious and Normal  THOUGHT PROCESS: Circumstantial and Goal directed  THOUGHT CONTENT: Negative Rumination  PERCEPTION: Within normal limits and No evidence of hallucinations  CURRENT SUICIDAL IDEATION: patient denies  CURRENT HOMICIDAL IDEATION: Patient denies  ORIENTATION: Alert and Oriented X 3.  CONCENTRATION: WNL  MEMORY:   Recent: intact   Remote: intact  COGNITIVE FUNCTION: Average intelligence  JUDGMENT: Intact  IMPULSE CONTROL: Good  INSIGHT: Fair    Risk Assessment:  ASSESSMENT OF RISK FOR SUICIDAL BEHAVIOR  Changes in risk for suicide from baseline Formulation of Risk and/or previous intake, including newly identified risk, if any: none    Session Content::  Wtr met with pt a [facility] at visitation. Pt wore an orange jumpsuit and appeared well-groomed. Wtr and pt discussed plan of action, as wtr would advocate for [name] to release pt from [facility] due to pt's recent compliance with Criminal Court as of 12/18/2017--admission to [facility], directly transferred to [facility] CD Inpatient 12/24/2017, successfully discharge 01/07/2018 and attending probation appointment 01/09/2018 despite pt knowing he would most likely be taken into custody because pt wanted to be in compliance with court.    Pt received well. Pt stated no safety concerns at  this time. Pt stated that he had been reading a book and was "keeping to myself". Wtr assured pt that wtr had been in contact with pt's newly assigned probation officer, [name], PO and rescheduled probation appointment. Wtr reviewed with pt that wtr would be present at pt's next court date with [name], and aside from faxing documentation of pt's successful completion of CD detox and inpatient, would also attempt to advocate for pt via his public defender. Pt was appreciative. Wtr and pt said goodbyes and wtr left.     Visit Diagnosis:    Major psychotic depression, recurrent- Primary ICD-10-CM: F33.3  ICD-9-CM: 296.34     PTSD (post-traumatic stress disorder) ICD-10-CM: F43.10  ICD-9-CM: 309.81     Alcohol abuse ICD-10-CM: F10.10  ICD-9-CM: 305.00       Interventions:  Continued to develop therapeutic relationship  Provided therapeutic support during discussion of distressing/traumatic events and/or symptoms  Reviewed and discussed Treatment Plan/Treatment Plan Review  Supportive Psychotherapy    Current Treatment Plan   Created/Updated On 09/29/2017   Next Treatment Plan Due 03/29/2018       Plan:  Other planned interventions or recommendations: Continue to coordinate with pt's PO [name]) and [name]    NEXT APPT: as per ACT calendar      Danise Edge, LMSW

## 2018-01-27 ENCOUNTER — Ambulatory Visit: Payer: Medicaid Other
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
## 2018-01-27 NOTE — Progress Notes (Signed)
Behavioral Health Progress Note     Length of Session: 150 minutes    Contact Type:  Location: Off Site, Drug Court at [facility]    Collateral Contact, Face to Face     Problem(s)/Goals Addressed from Treatment Plan:  Treatment Problem #1 03/27/2017, continued from 09/29/2017   Patient Identified Problem Alc/drugs and instability at pt's s/o's and mother's residences       Treatment Goal #1 03/27/2017, continued from 09/29/2017   Patient Identified Goal "I will get my own apartment asap".      Progress toward goal(s): 01/27/2018: No change, pt continues to reside between both his mother's residence and his s/o's residence, and continues to be unable to save [name] for his own apartment despite his desire to do so. Pt agreed on 01/26/2018 to have a rep-payee to assist pt in saving [name] for his own apartment. ACT Team will assist pt by linking him to rep-payee.    1 a. Measurable Objectives : Derek [name] will work with ACT Team at least 1x a month to form a Barrister's clerk, check-in with ongoing management of finances throughout month, and to note any adjustments that may need to be made after reviewing the month's income and overall expenses.  Date established: 03/27/2017 and continued on from this date 09/29/2017  Target date: 03/29/2018  Attained or Revised? Revised    1 b. Measurable Objectives :Derek [name] will meet with ACT staff at least twice monthly to discuss and receive assistance in searching for safe and affordable housing, scheduling tours, making phone calls, completing applications, etc. As demonstrated by Derek [name] having a safe and affordable place to live in the next 6 months.  Date established:09/02/2016 and continued on from this date 09/29/2017  Target date: 03/29/2018  Attained or Revised? Revised    1 c. Measurable Objectives : Derek [name] will continue to utilize clinical appointments with the ACT Team to address  communication and conflict with s/o, pt's mother, and other family members to assist pt in establishing healthy boundary lines and communication styles  Date established: 03/27/2017 and continued on from this date 09/29/2017  Target date: 03/29/2018  Attained or Revised? Revised      ..........................................................................................................................................    Treatment Problem #2 03/27/2017, continued from 09/29/2017   Patient Identified Problem poor physical and mental health       Treatment Goal #2 03/27/2017, continued from 09/29/2017   Patient Identified Goal "I will get my health back in order (attending medical and clinical appointments, nutrition/weight)".       Progress toward goal(s): No change. Comment: Pt has had brief interludes of sobriety and attending clinical appointments but has continued to struggle to attend medical appointments and this continued avoidance has become apoint of excessive anxiety and stress for pt due to ongoing health concerns that are unaddressed.Pt has continued to move towards his goal of successfully completing outpatient CD treatment at [facility]. Pt wasin Phase II of treatment at [facility]., but as of this date 09/29/2017 has been terminated due to lack of attendance.    01/27/2018: Pt has continued to struggle to attend probation, medical, mental health, and CD appointments. Pt identifies CD as primary barrier as well as ongoing conflict/stressors with family.       2a. Measurable Objectives : I will [name]  Date established: 09/02/16  Target date: 09/27/2017  Attained or Revised? Discontinued, see revised objective 2aa    2aa. Measurable Objective:  Derek [name] will address his chemical dependency at the level of care that  his sentencing Derek [name] sees appropriate.  Date established: 09/29/2017  Target date: 03/29/2018  Attained or Revised? Discontinued, see revised objected 2aaa    2aaa. Measurable Objective: Derek [name] will address his chemical dependency at the level of care that his sentencing Derek [name] sees appropriate. Pt is currently court ordered to attend [facility] CD Outpatient treatment with pt's intial appointment at [facility] CD Outpatient is scheduled for 01/28/2018 to complete CD eval.     Date established: 01/27/2018    Target date: 06/2018    Attained or Revise? Revised    2b. Measurable Objectives : Derek [name] will discuss barriers to attending medical and clinical appointments and work with ACT Team to overcome these challenges to increase pt's compliance with treatment recommendation at least 1x a month.  Date established: 03/27/2017 and continued 09/29/2017  Target date: 03/29/2018  Attained or Revised? Revised    2c. Measurable Objectives : Derek [name] will continue to explore and discuss cognitive dissonance between continued use of alc and maintaining sobriety and the effects of pt's alc use on his physical and mental health.  Date established: 03/27/2017 and continued 09/29/2017  Target date: 03/29/2018  Attained or Revised? Revised      Mental Status Exam:  APPEARANCE: Casual, Disheveled  ATTITUDE TOWARD INTERVIEWER: Cooperative  MOTOR ACTIVITY: WNL (within normal limits)  EYE CONTACT: Direct  SPEECH: Normal rate and tone and Minimal  AFFECT: Pleasant and Depressed  MOOD: Depressed  THOUGHT PROCESS: Circumstantial and Goal directed  THOUGHT CONTENT: No unusual themes  PERCEPTION: Within normal limits  CURRENT SUICIDAL IDEATION: patient denies  CURRENT HOMICIDAL IDEATION: Patient denies  ORIENTATION: Alert and Oriented X 3.  CONCENTRATION: WNL  MEMORY:   Recent: intact   Remote:  intact  COGNITIVE FUNCTION: Average intelligence  JUDGMENT: Intact  IMPULSE CONTROL: Good  INSIGHT: Good    Risk Assessment:  ASSESSMENT OF RISK FOR SUICIDAL BEHAVIOR  Changes in risk for suicide from baseline Formulation of Risk and/or previous intake, including newly identified risk, if any: none    Session Content::  Wtr attended Drug Court at [facility] on this date 01/27/2018. Drug Court docket was over-loaded with individuals in custody, so wtr waited until pt was brought up from [facility] to be seen in court. Wtr greeted pt's s/o Derek [name] who was present for pt's court appearance and wtr coordinated with Drug Geneticist, molecular, [place].     Derek [name], CO came over to wtr and stated that she used to work with pt's s/o Derek [name] via [employer] and stated, "She's a terrible alcoholic". Wtr spoke with Derek [name] briefly, reviewing why pt was being seen in Drug Court today and would be transferred to Mental Health Court as Derek [name] stated confusion. Derek [name] received well.     Wtr clinically represented pt in front of Derek [name] and wtr advocated for pt to be released on this date 01/27/2018 so that pt could attend CD eval 01/28/2018 at [facility] CD Outpatient, medical appointment (at ID clinic) on 01/29/2018, and probation appointment 01/30/2018. Wtr briefly explained pt's recent history, attending [facility] detox on 12/18/2017, admitted to [facility] CD Inpatient on 12/24/2017, released on 01/07/2018 and going to probation appointment 01/09/2018-- knowing that he would most likely be taken into custody because he wanted to be in compliance with the court. Derek [name] stated appreciation for this and put pt on random tox screens, and asked for pt to re-appear in front  of him on Monday 02/02/2018 for first official Mental Health Court appearance at 9:15am. Pt was agreeable. Wtr thanked [name].    Wtr asked Derek [name] if she would be waiting for Kamali to be released from custody and she stated she was going  to. Wtr wrote down appointments for next three days on a piece of paper with times and pick-up times for ACT Team to transport pt. Wtr gave to [employer] and asked her to give to [place]. Derek [name] agreed to do so. Wtr spoke with pt and told him that wtr had given schedule for the week to Derek [name]. Wtr reviewed that wtr was not sure if Derek [name] would place pt on random tox screens or not, but since he did, reviewed the importance of maintaining abstinence and reporting drug/alc use before tox screen if a slip/lapse did occur. Pt agreed to do so. Wtr and pt shook hands and wtr left. Wtr left courtroom, while pt waited in courtroom, cuffed to other inmates until brought back down to [facility] to be processed out of incarceration.      Visit Diagnosis:    Major psychotic depression, recurrent- Primary ICD-10-CM: F33.3  ICD-9-CM: 296.34     PTSD (post-traumatic stress disorder) ICD-10-CM: F43.10  ICD-9-CM: 309.81     Alcohol abuse ICD-10-CM: F10.10  ICD-9-CM: 305.00       Interventions:  Collateral contact with:  Derek [name], Roseto Broom (Drug Court Coordinator), Derek [name], CO, and pt's s/o Derek [name]  Continued to develop therapeutic relationship  Reviewed and discussed Treatment Plan/Treatment Plan Review   Wtr clinically represented pt in front of Derek [name] at [employer] on this date 01/27/2018    Current Treatment Plan   Created/Updated On 09/29/2017   Next Treatment Plan Due 03/29/2018       Plan:  Other planned interventions or recommendations: Pt will be released from custody at [facility] on this date 01/27/2018. ACT Team will accompany pt to CD eval at [facility] CD Inpatient on 01/28/2018, ID clinic appointment on 01/29/2018, and probation appointment on 01/30/2018. Pt has been placed on random tox screens and must call [facility] every morning to see if his number has been called and if it has must get to court to take tox screen. Pt has been scheduled to appear for first court appearance in Mental Health  Court on Monday 02/02/2018 at 9:15am.    NEXT APPT: 01/28/2018, as per ACT calendar    Derek [name], LMSW
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
## 2018-01-28 ENCOUNTER — Ambulatory Visit: Payer: Medicaid Other

## 2018-01-29 ENCOUNTER — Ambulatory Visit: Payer: Medicaid Other | Admitting: Infectious Diseases

## 2018-01-29 ENCOUNTER — Telehealth: Payer: Self-pay | Admitting: Infectious Diseases

## 2018-01-29 ENCOUNTER — Ambulatory Visit: Payer: Medicaid Other

## 2018-01-29 NOTE — Telephone Encounter (Signed)
Derek Walker called to cancel his appointment for today and writer informed him that per the notes in his chart, Mardene Celeste could still see him at 2pm. Patient declined stating that he was waiting for someone to talk about his Medicaid with him and wouldn't be able to come today. Writer rescheduled him for 5/24 at 8:30am. No need for callback at this time.

## 2018-01-30 ENCOUNTER — Ambulatory Visit: Payer: Medicaid Other

## 2018-01-30 NOTE — Progress Notes (Signed)
[facility] MISSED/CANCELLED APPOINTMENT     Name: Derek [name]  MRN: [id number]   DOB: July 28, 1976    Date of Scheduled Service: 01/29/2018    [name] cancelled today's appointment with less than 24 hours notice.  I have spoken with the patient by phone.  The patient stated the reason for today's absence is he was already been seen by DSS for SNAP benefit interview and had "food crisis" at his s/o's [name] residence and needed to resolve. Wtr accepted. Wtr also reviewed importance of pt communicating clearly with wtr as well as accountability as ID clinic appointment had to be rescheduled for 02/06/2018..    Additional Information:    Wtr spoke with pt's mother, [name], pt's s/o [name], and spoke with pt's s/o's son [name] face-to-face in effort to locate pt to attend ID clinic appointment on this date.     [name], LMSW

## 2018-01-30 NOTE — Progress Notes (Signed)
Behavioral Health Progress Note     Length of Session: 180 minutes    Contact Type:  Location: Off Site    Complex - Other Clinical Participants: Derek [name], LMSW at [name], Face to Face     Problem(s)/Goals Addressed from Treatment Plan:  Treatment Problem #1 03/27/2017, continued from 09/29/2017   Patient Identified Problem Alc/drugs and instability at pt's s/o's and mother's residences       Treatment Goal #1 03/27/2017, continued from 09/29/2017   Patient Identified Goal "I will get my own apartment asap".      Progress toward goal(s): 01/27/2018: No change, pt continues to reside between both his mother's residence and his s/o's residence, and continues to be unable to save [name] for his own apartment despite his desire to do so. Pt agreed on 01/26/2018 to have a rep-payee to assist pt in saving [name] for his own apartment. ACT Team will assist pt by linking him to rep-payee.    1 a. Measurable Objectives : Derek [name] will work with ACT Team at least 1x a month to form a Barrister's clerk, check-in with ongoing management of finances throughout month, and to note any adjustments that may need to be made after reviewing the month's income and overall expenses.  Date established: 03/27/2017 and continued on from this date 09/29/2017  Target date: 03/29/2018  Attained or Revised? Revised    1 b. Measurable Objectives :Derek [name] will meet with ACT staff at least twice monthly to discuss and receive assistance in searching for safe and affordable housing, scheduling tours, making phone calls, completing applications, etc. As demonstrated by Derek [name] having a safe and affordable place to live in the next 6 months.  Date established:09/02/2016 and continued on from this date 09/29/2017  Target date: 03/29/2018  Attained or Revised? Revised    1 c. Measurable Objectives : Derek [name] will continue to utilize clinical appointments with the ACT Team to  address communication and conflict with s/o, pt's mother, and other family members to assist pt in establishing healthy boundary lines and communication styles  Date established: 03/27/2017 and continued on from this date 09/29/2017  Target date: 03/29/2018  Attained or Revised? Revised      ..........................................................................................................................................    Treatment Problem #2 03/27/2017, continued from 09/29/2017   Patient Identified Problem poor physical and mental health       Treatment Goal #2 03/27/2017, continued from 09/29/2017   Patient Identified Goal "I will get my health back in order (attending medical and clinical appointments, nutrition/weight)".       Progress toward goal(s): No change. Comment: Pt has had brief interludes of sobriety and attending clinical appointments but has continued to struggle to attend medical appointments and this continued avoidance has become apoint of excessive anxiety and stress for pt due to ongoing health concerns that are unaddressed.Pt has continued to move towards his goal of successfully completing [name] CD treatment at [name]. Pt wasin Phase II of treatment at [name]., but as of this date 09/29/2017 has been terminated due to lack of attendance.    01/27/2018: Pt has continued to struggle to attend probation, medical, mental health, and CD appointments. Pt identifies CD as primary barrier as well as ongoing conflict/stressors with family.       2a. Measurable Objectives : I will successfully complete [name]. [name] Chemical Dependency treatment  Date established: 09/02/16  Target date: 09/27/2017  Attained or Revised? Discontinued, see revised objective 2aa    2aa.  Measurable Objective: Derek [name] will address his chemical dependency at the level of  care that his sentencing Derek [name] sees appropriate.  Date established: 09/29/2017  Target date: 03/29/2018  Attained or Revised? Discontinued, see revised objected 2aaa    2aaa. Measurable Objective: Derek [name] will address his chemical dependency at the level of care that his sentencing Derek [name] sees appropriate. Pt is currently court ordered to attend Derek [name] treatment with pt's intial appointment at [name] [name] is scheduled for 01/28/2018 to complete CD eval.                         Date established: 01/27/2018                        Target date: 06/2018                        Attained or Revise? Revised    2b. Measurable Objectives : Derek [name] will discuss barriers to attending medical and clinical appointments and work with ACT Team to overcome these challenges to increase pt's compliance with treatment recommendation at least 1x a month.  Date established: 03/27/2017 and continued 09/29/2017  Target date: 03/29/2018  Attained or Revised? Revised    2c. Measurable Objectives : Derek [name] will continue to explore and discuss cognitive dissonance between continued use of alc and maintaining sobriety and the effects of pt's alc use on his physical and mental health.  Date established: 03/27/2017 and continued 09/29/2017  Target date: 03/29/2018  Attained or Revised? Revised        Mental Status Exam:  APPEARANCE: Well-groomed, Casual  ATTITUDE TOWARD INTERVIEWER: Cooperative  MOTOR ACTIVITY: WNL (within normal limits)  EYE CONTACT: Direct  SPEECH: Normal rate and tone  AFFECT: Pleasant  MOOD: Normal  THOUGHT PROCESS: Circumstantial and Goal directed  THOUGHT CONTENT: No unusual themes  PERCEPTION: Within normal limits and No evidence of hallucinations  CURRENT SUICIDAL IDEATION: patient denies  CURRENT HOMICIDAL IDEATION: Patient denies  ORIENTATION: Alert and  Oriented X 3.  CONCENTRATION: WNL  MEMORY:   Recent: intact   Remote: intact  COGNITIVE FUNCTION: Average intelligence  JUDGMENT: Intact  IMPULSE CONTROL: Good  INSIGHT: Fair    Risk Assessment:  ASSESSMENT OF RISK FOR SUICIDAL BEHAVIOR  Changes in risk for suicide from baseline Formulation of Risk and/or previous intake, including newly identified risk, if any: none  Violence risk was assessed and No Change noted from baseline formulation of risk and/or previous assessment.    Session Content::  Wtr transported pt to [facility] [name] to complete CD evaluation. Wtr sat in on pt's CD eval with Derek [name], LMSW. Wtr called Derek [name] and scheduled pt for eye exam and new pair of glasses on 02/04/2018 and confirmed that pt's insurance ([employer]) covers cost of both exam and glasses. Pt was appreciative. Wtr transported pt back to his s/o Derek [name]'s residence. Wtr reviewed ID clinic appointment 01/29/2018  and probation appointment 01/30/2018  with pt and pick-up times with transportation from ACT Team. Pt was agreeable. Pt stated no safety concerns at this time. Wtr and pt said goodbyes and wtr left.       Visit Diagnosis:    Major psychotic depression, recurrent- Primary ICD-10-CM: F33.3  ICD-9-CM: 296.34     PTSD (post-traumatic stress disorder) ICD-10-CM: F43.10  ICD-9-CM: 309.81     Alcohol abuse ICD-10-CM: F10.10  ICD-9-CM: 305.00  Interventions:  Collateral contact with:  Derek [name], LMSW  Continued to develop therapeutic relationship  Reviewed and discussed Treatment Plan/Treatment Plan Review  Wtr transported pt to [name] CD evaluation.   Wtr called Derek [name] and scheduled appointment for pt for eye exam and new pair of glasses on 02/04/2018 (pt's health insurance ([employer]) covers exam and glasses)      Current Treatment Plan   Created/Updated On 09/29/2017   Next Treatment Plan Due 03/29/2018       Plan:  Other planned interventions or recommendations: ACT Team will transport pt  01/29/2018 to ID clinic appointment. ACT Team will transport pt to probation appointment 01/30/2018. ACT Team will continue to coordinate and clinically represent pt at [facility] [address], next court appearance 02/02/2018    NEXT APPT: 01/29/2018, as per ACT calendar      Derek [name], LMSW

## 2018-02-02 ENCOUNTER — Ambulatory Visit: Payer: Medicaid Other

## 2018-02-02 ENCOUNTER — Ambulatory Visit: Payer: Medicaid Other | Admitting: Psychiatry

## 2018-02-02 DIAGNOSIS — Z7289 Other problems related to lifestyle: Secondary | ICD-10-CM

## 2018-02-02 DIAGNOSIS — Z789 Other specified health status: Secondary | ICD-10-CM

## 2018-02-02 DIAGNOSIS — F4310 Post-traumatic stress disorder, unspecified: Secondary | ICD-10-CM

## 2018-02-02 DIAGNOSIS — F333 Major depressive disorder, recurrent, severe with psychotic symptoms: Secondary | ICD-10-CM

## 2018-02-02 NOTE — Progress Notes (Signed)
Behavioral Health Psychopharmacology Follow-up     Duration:  15 minutes      Diagnosis Addressed    ICD-10-CM ICD-9-CM   1. 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 PTSD (post-traumatic stress disorder) F43.10 309.81   3. Alcohol use Z78.9 V49.89       Chief Complaint: Patient was seen for follow-up    There were no vitals taken for this visit.  Wt Readings from Last 3 Encounters:   09/19/17 73 kg (161 lb)   04/16/17 74.4 kg (164 lb)   02/06/17 74.7 kg (164 lb 11.2 oz)       Recent History and Response to Medications  Mr. Nichlos Kunzler was seen today for psychopharmacological follow-up at his significant other's residence by Dr. Maxie Better and writer. More recently, Wilferd had completed chemical dependency detox program at [facility] followed by [facility] dependency inpatient rehab (discharged on 01/07/2018). He is currently in Mental Health Court and had his first appearance today. He has endorsed that he has continued to drink alcohol daily.    During the psychopharmacology evaluation, Patty was notably intoxicated though was pleasant and cooperative during the interaction. He stated that he was concerned about relapsing on alcohol though feels that he has been drinking alcohol due to experiencing heightened stressors, citing where he is currently living as being a stressor for him and highlighting some disagreements involving his significant other. He stated that he felt that perhaps the psychotropic medication regimen that he is on may not be helping to address his anxiety and stated that he has not slept in over three months. At times, he made reference that if one more thing were to happen within his neighborhood he could react violently though did acknowledge that he wanted to avoid going to jail and would not want to do act violently as this could be the consequence. He also stated that he wanted to be there and present for his children/step-children. He denied having acute safety  concerns and reported that he has been taking psychotropic oral medication as prescribed. He stated that he did like the inpatient chemical dependency program at [facility]. Team discussed that re-attending inpatient chemical dependency treatment could be helpful.     Current use of alcohol or drugs: yes alcohol use    ENERGY: Fair  SLEEP: Insomnia, as indicated in HPI  APPETITE: Not assessed at this time  WEIGHT: Not assessed at this time  SEXUAL FUNCTION: not asked  ENJOYMENT/INTEREST: Decreased    Review of Systems:  Pertinent items are noted in HPI.    Current Medications  Current Outpatient Prescriptions   Medication Sig    mirtazapine (REMERON) 7.5 MG tablet TAKE 1 TABLET (7.5MG  TOTAL) BY MOUTH NIGHTLY.    OLANZapine (ZYPREXA) 10 MG tablet Take 1 tablet (10 mg total) by mouth nightly   TDD     gabapentin (GABAPENTIN)  capsule Take 2 capsules (200 mg total) by mouth 3 times daily   For anxiety    mirtazapine (REMERON) 15 MG tablet TAKE 1 TABLET (  TOTAL) BY MOUTH NIGHTLY.    OLANZapine (ZYPREXA) 5 MG tablet TAKE 1 TABLET (  TOTAL) BY MOUTH EVERY MORNING.    naltrexone (DEPADE) 50 MG tablet TAKE 1 TABLET (  TOTAL) BY MOUTH DAILY    bictegravir-emtricitabine-tenofovir (BIKTARVY) 50-200-25 MG per tablet Take 1 tablet by mouth daily    sulfamethoxazole-trimethoprim (BACTRIM DS,SEPTRA DS) 800-160 MG per tablet Take 1 tablet by mouth daily    omeprazole (PRILOSEC) 40 MG  capsule Take 1 capsule (40 mg total) by mouth daily    efavirenz-emtrictabine-tenofovir (ATRIPLA) 600-200-300 MG per tablet Take 1 tablet by mouth nightly on an empty stomach     No current facility-administered medications for this visit.        Side Effects  Denied having side-effects to medication regimen    Mental Status  APPEARANCE: Appears stated age, Casual, Smell of alcohol notable on breath  ATTITUDE TOWARD INTERVIEWER: Cooperative  MOTOR ACTIVITY: Not formally assessed, no tremor appreciated, gait normal  EYE CONTACT:  Direct  SPEECH: Normal rate and tone  AFFECT: Dysphoric  MOOD: Dysphoric  THOUGHT PROCESS: Provided sequential, goal-directed responses to questions  THOUGHT CONTENT: Appropriate to topic of conversation, expressed general distrust of some individuals living in his neighborhood  PERCEPTION: Did not appear to be responding to internal stimuli during interaction  ORIENTATION: Alert and oriented to situation  CONCENTRATION: Fair  MEMORY:   Recent: intact   Remote: intact  COGNITIVE FUNCTION: Average intelligence  JUDGMENT: Impaired -  moderate  IMPULSE CONTROL: Fair to Poor  INSIGHT: Some insight into experiencing anxiety, limited insight into substance use    Risk Assessment  Self Injury: Patient Denies  Suicidal Ideation: Patient Denies  Homicidal Ideation: Patient Denies (denied acute homicidal ideation)  Aggressive Behavior: Patient Denies (denied acute aggressive behavior)    If any of the answers above are Yes, is there access to lethal means?   N/A    [place] Scale administered? N/A    Results  All labs in the last 72 hours:No results found for this or any previous visit (from the past 72 hour(s)).    Current Treatment Plan   Created/Updated On 02/21/2016   Next Treatment Plan Due 08/22/2016       Assessment:  Mr. Hy Swiatek is a 42 year old male with a past history significant for prior diagnoses of major depressive disorder, recurrent, with psychotic features, PTSD, and alcohol use disorder who was seen today for psychopharmacological follow-up. During the interaction, Jeanpierre notably appeared to be intoxicated though was alert and polite during the interaction. He provided goal-directed responses to questions. He expressed frustration and stress with current living situation and with relapsing on alcohol use, citing stress as leading to the relapse. He did make reference of possibly acting violently in the future if his situation does not improve though stated though denied acute violent ideation/intent, citing  that acting violently would lead to incarceration and not being with his children. Junie expressed concern that his psychotropic medication regimen was not treating the anxiety leading to relapse. However, continued alcohol use can affect mood and anxiety symptoms, and Jaimie was encouraged to reconsider more intensive chemical dependency treatment, such as inpatient rehab. Plan is to continue with current medication regimen and continue to monitor response. Based on assessment today, patient remains appropriate for continued Assertive Community Treatment (ACT) team care as he has a history of minimal engagement with outpatient mental health contributing to mental health decompensation.     Plan and Rationale:  -Continue Olanzapine 5 mg in the morning, 20 mg at night for mood stabilization, psyhosis  -Continue Mirtazapine 22.5 mg nightly for depression  -Continue Gabapentin 100 mg TID for anxiety  -Continue Naltrexone 50 mg daily for alcohol use disorder  -Continue motivational interview techniques to help address alcohol use  -Follows with infectious disease  -Follow-up with ACT team

## 2018-02-03 ENCOUNTER — Ambulatory Visit: Payer: Medicaid Other

## 2018-02-03 DIAGNOSIS — F333 Major depressive disorder, recurrent, severe with psychotic symptoms: Secondary | ICD-10-CM
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
## 2018-02-03 NOTE — Progress Notes (Signed)
[facility] MISSED/CANCELLED APPOINTMENT     Name: Derek Walker  MRN: [id number]   DOB: Oct 16, 1975    Date of Scheduled Service: 01/30/2018    [name] cancelled today's appointment with less than 24 hours notice.  I have discussed today's appointment with my supervisor. and spoken with the pt by phone    Additional Information:    [name] stated that he had already attended probation appointment independently, earlier than scheduled. Pt stated no safety concerns at this time and agree to meet with wtr on 02/02/2018 for Mental health Court appearance. Wtr and pt reviewed safety plan for the weekend, specifically in effort to maintain absitnence from alc/drug.     [name], LMSW

## 2018-02-04 NOTE — Progress Notes (Signed)
Behavioral Health Progress Note     Length of Session: 90 minutes    Contact Type:  Location: Off Site    Brief Face to Face     Problem(s)/Goals Addressed from Treatment Plan:    Problem 1:   Treatment Problem #1 02/21/2016   Patient Identified Problem unstable housing       Goal for this problem:    Treatment Goal #1 02/21/2016   Patient Identified Goal "I will get my own apartment asap".         Progress towards this goal: No change. Comment: No change at this goal     Mental Status Exam:  APPEARANCE: Casual  ATTITUDE TOWARD INTERVIEWER: Cooperative  MOTOR ACTIVITY: WNL (within normal limits)  EYE CONTACT: Direct  SPEECH: Normal rate and tone  AFFECT: Irritable  MOOD: Angry  THOUGHT PROCESS: Normal  THOUGHT CONTENT: No unusual themes  PERCEPTION: Within normal limits  CURRENT SUICIDAL IDEATION: Vague/passive suicidal ideation and Without Intent  CURRENT HOMICIDAL IDEATION: Patient denies  ORIENTATION: Alert and Oriented X 3.  CONCENTRATION: Fair  MEMORY:   Recent: intact   Remote: intact  COGNITIVE FUNCTION: Average intelligence  JUDGMENT: Intact  IMPULSE CONTROL: Fair  INSIGHT: Fair    Risk Assessment:  ASSESSMENT OF RISK FOR SUICIDAL BEHAVIOR  Changes in risk for suicide from baseline Formulation of Risk and/or previous intake, including newly identified risk, if any: none    Session Content::      Writer with [name] for an individual session at his mother's home. Yan was scheduled to attend his orientation session at [employer]. Giovanie reported that the appointment had been rescheduled for later and he could not make it. He walked away with his friends he had already told he wasn't going. Blayze did return while Clinical research associate was speaking with his primary Risk manager and he went.    On the way there Ovila vented angrily about his frustartion with the court system and chemical dependency and all the time he needs to take to complete this stuff. He mentioned that he might consider just doing his  time.     Upon getting there Keoni's presentation changed and he was far more cooperative clearly trying to looks table in front of his new counselor. She introduced the program that will begin Friday for him and is scheduled 915-1115am M,W,F.Javyon was in agreement and was in a much better mood throughout his session and the ride home.     He thanked Clinical research associate for the time and bid him goodbye.     Tajai will have an intensive program to finish with chemical dependency and court and will need to be motivated and follow through. He admitted to drinking yesterday and still smelled of alcohol at times today.     Visit Diagnosis:      ICD-10-CM ICD-9-CM   1. Major psychotic depression, recurrent F33.3 296.34       Interventions:  Continued to develop therapeutic relationship  Referral to community organization: [facility]    Current Treatment Plan   Created/Updated On 02/21/2016   Next Treatment Plan Due 08/22/2016         Plan:  Psychotherapy continues as described in care plan; plan remains the same.    NEXT APPT: Continue ACT Team Schedule.       Tyrone Sage, MSW

## 2018-02-06 ENCOUNTER — Ambulatory Visit: Payer: Medicaid Other

## 2018-02-06 ENCOUNTER — Ambulatory Visit: Payer: Medicaid Other | Admitting: Infectious Diseases

## 2018-02-06 ENCOUNTER — Telehealth: Payer: Self-pay | Admitting: Infectious Diseases

## 2018-02-06 DIAGNOSIS — F333 Major depressive disorder, recurrent, severe with psychotic symptoms: Secondary | ICD-10-CM

## 2018-02-06 NOTE — Progress Notes (Signed)
Clinical Management Note     Writer called [facility] when en route to pick up and Clinical research associate was informed by Lexi that he was as mothers house. Writer called mother and she was unaware that he was there but attempted to wake him up. Mother was initially unsuccesful x2 and hung up the phone x2. Mother. Writer called back a 3 rd time and mother informed that pt was getting ready and agreeable to go to this appointment. When pt go into the car, he states he did not know he had this appointment, etc. Writer went through the texts that [facility] sent with him and informed him of all of this information. Pt reports he is tired, stressed, depressed and he and [name] " got into it last night" and states he  "never ever ever wants to go back there again". While on route to [facility] writer states that he will not be going to this appointment as he was not aware that [facility] would drop him off and he would have to take the bus home via the transit center. States " Im too stressed to be dealing with all of that and all of those people". Writer attempted to encourage pt to attend appointment and provided coping strategies on taking the bus. Pt continued to refuse. ID appt rescheduled per eRecord. ACT will need to transportation pt to and from this appt. Pt asks writer to drop him off at probation. Writer did so without incidence and gave pt bus pass. Pt states that he will take the bus " directly to moms house" and he is ok doing so. Pt left in NAD and thanked Clinical research associate.

## 2018-02-06 NOTE — Telephone Encounter (Signed)
Mr. Lohr is calling to cancel his appointment which is currently scheduled for 8:30AM with Mardene Celeste today, 5/24. Patient was having an anxiety attack so he is choosing to not come in.     Has the appointment been cancelled? yes    Has the appointment been rescheduled? yes    Date of new appointment? Friday 6/7 at 11:30am.     Does the patient need a call back to reschedule?no    Patient can be contacted back at [phone].

## 2018-02-12 ENCOUNTER — Ambulatory Visit: Payer: Medicaid Other

## 2018-02-16 ENCOUNTER — Ambulatory Visit: Payer: Medicaid Other | Attending: Psychiatry

## 2018-02-16 DIAGNOSIS — Z789 Other specified health status: Secondary | ICD-10-CM | POA: Insufficient documentation

## 2018-02-16 DIAGNOSIS — F4310 Post-traumatic stress disorder, unspecified: Secondary | ICD-10-CM | POA: Insufficient documentation

## 2018-02-16 DIAGNOSIS — F323 Major depressive disorder, single episode, severe with psychotic features: Secondary | ICD-10-CM | POA: Insufficient documentation

## 2018-02-16 DIAGNOSIS — F333 Major depressive disorder, recurrent, severe with psychotic symptoms: Secondary | ICD-10-CM | POA: Insufficient documentation

## 2018-02-17 NOTE — Progress Notes (Signed)
Behavioral Health Progress Note     Length of Session: 120 minutes    Contact Type:  Location: Off Site    Face to Face     Problem(s)/Goals Addressed from Treatment Plan:  Treatment Problem #1 03/27/2017, continued from 09/29/2017   Patient Identified Problem Alc/drugs and instability at pt's s/o's and mother's residences       Treatment Goal #1 03/27/2017, continued from 09/29/2017   Patient Identified Goal "I will get my own apartment asap".      Progress toward goal(s): 01/27/2018: No change, pt continues to reside between both his mother's residence and his s/o's residence, and continues to be unable to save money for his own apartment despite his desire to do so. Pt agreed on 01/26/2018 to have a rep-payee to assist pt in saving money for his own apartment. ACT Team will assist pt by linking him to rep-payee.    1 a. Measurable Objectives : [name] will work with ACT Team at least 1x a month to form a Barrister's clerkmonthly budget, check-in with ongoing management of finances throughout month, and to note any adjustments that may need to be made after reviewing the month's income and overall expenses.  Date established: 03/27/2017 and continued on from this date 09/29/2017  Target date: 03/29/2018  Attained or Revised? Revised    1 b. Measurable Objectives :[name] will meet with ACT staff at least twice monthly to discuss and receive assistance in searching for safe and affordable housing, scheduling tours, making phone calls, completing applications, etc. As demonstrated by Rayna Sextonalph having a safe and affordable place to live in the next 6 months.  Date established:09/02/2016 and continued on from this date 09/29/2017  Target date: 03/29/2018  Attained or Revised? Revised    1 c. Measurable Objectives : [name] will continue to utilize clinical appointments with the ACT Team to address communication and conflict with s/o, pt's mother, and other family members  to assist pt in establishing healthy boundary lines and communication styles  Date established: 03/27/2017 and continued on from this date 09/29/2017  Target date: 03/29/2018  Attained or Revised? Revised      ..........................................................................................................................................    Treatment Problem #2 03/27/2017, continued from 09/29/2017   Patient Identified Problem poor physical and mental health       Treatment Goal #2 03/27/2017, continued from 09/29/2017   Patient Identified Goal "I will get my health back in order (attending medical and clinical appointments, nutrition/weight)".       Progress toward goal(s): No change. Comment: Pt has had brief interludes of sobriety and attending clinical appointments but has continued to struggle to attend medical appointments and this continued avoidance has become apoint of excessive anxiety and stress for pt due to ongoing health concerns that are unaddressed.Pt has continued to move towards his goal of successfully [facility] but as of this date 09/29/2017 has been terminated due to lack of attendance.    01/27/2018: Pt has continued to struggle to attend probation, medical, mental health, and CD appointments. Pt identifies CD as primary barrier as well as ongoing conflict/stressors with family.     02/02/2018: Pt has first Mental Health Court appearance and is scheduled to attend [facility] orientation on 02/03/2018. Pt reports alcohol use 02/02/2018 at 4am.      2a. Measurable Objectives : I [name]  Date established: 09/02/16  Target date: 09/27/2017  Attained or Revised? Discontinued, see revised objective  2aa    2aa. Measurable Objective: Alvon will address his chemical dependency at the level of care that his sentencing [name] sees appropriate.  Date established: 09/29/2017  Target date: 03/29/2018  Attained or Revised? Discontinued, see revised objected 2aaa    2aaa. Measurable Objective: Ejay will address his chemical dependency at the level of care that his sentencing [name] sees appropriate. Pt is currently court ordered to attend [facility] treatment with pt's intial appointment at [facility] Outpatient is scheduled for 01/28/2018 to complete CD eval.   Date established: 01/27/2018  Target date: 06/2018  Attained or Revise? Revised    2b. Measurable Objectives : Mayjor will discuss barriers to attending medical and clinical appointments and work with ACT Team to overcome these challenges to increase pt's compliance with treatment recommendation at least 1x a month.  Date established: 03/27/2017 and continued 09/29/2017  Target date: 03/29/2018  Attained or Revised? Revised    2c. Measurable Objectives : Jeremaih will continue to explore and discuss cognitive dissonance between continued use of alc and maintaining sobriety and the effects of pt's alc use on his physical and mental health.  Date established: 03/27/2017 and continued 09/29/2017  Target date: 03/29/2018  Attained or Revised? Revised      Mental Status Exam:  APPEARANCE: Well-groomed, Casual  ATTITUDE TOWARD INTERVIEWER: Cooperative  MOTOR ACTIVITY: WNL (within normal limits)  EYE CONTACT: Direct  SPEECH: Normal rate and tone  AFFECT: Anxious and Pleasant  MOOD: Anxious  THOUGHT PROCESS: Circumstantial, Goal directed and Tangential  THOUGHT CONTENT: Negative Rumination, Pt stated "deep, underlying fear is that I will die from"  infectious disease  PERCEPTION: Within normal limits  CURRENT SUICIDAL IDEATION: patient denies  CURRENT HOMICIDAL IDEATION: Patient denies  ORIENTATION: Alert and Oriented X 3.  CONCENTRATION: WNL  MEMORY:   Recent: intact   Remote: intact  COGNITIVE FUNCTION: Average intelligence  JUDGMENT: Impaired -  minimal  IMPULSE CONTROL: Fair  INSIGHT: Fair    Risk Assessment:  ASSESSMENT OF RISK FOR SUICIDAL BEHAVIOR  Changes in risk for suicide from baseline Formulation of Risk and/or previous intake, including newly identified risk, if any: none    Session Content::  Wtr picked up pt from [facility] and drove to [name] on [address]. Wtr purchased pt a small coffee with ACT Team petty cash. Wtr and pt sat at [facility] and reviewed treatment plan. Pt stated that he was agreeable to going to a halfway house and/or treatment apartment program as he identified that it would be helpful to reside in a sober environment. Tyre unfolded pieces of paper from his pocket and stated, "I told you I'd show you these. This is that assignment. About the monster inside of [place]." On one piece of paper, Richerd drew and colored in what appeared to be a demon-like green man with red horns, red fangs, and a tail with a red spear at the end. The other piece of paper had questions re: "the monster inside you"-- pt was able to easily articulate "the monster's" need for alcohol and erratic behavior once intoxicated, whereas pt has not historically described himself this way. Wtr reflected that it feels accurate for Seymour to differentiate between himself and his "alcholism" as pt stated.     Elizandro stated that he has not had any cannabis use since 02/07/2018 and no alcohol since 02/14/2018. Wtr commends pt for doing so  and reviews activities that pt is participating in to aid him in his CD recovery.     Pt states that he is not sure if he can stay sober in the community, and wishes to seek alternative housing at a halfway house  and/or treatment apartment program to maintain an atmosphere of recovery. Wtr states support of pt's recovery.     Pt states that he has a, "deep, underlying fear that I will die from it" (infectious disease), "I think I will". Wtr reviewed with pt that pt can address his medical concerns with his ID clinic provider, but that also pt can live a high quality of life with infectious disease so long as he receives treatment. Pt states understanding, but continues to state fear re: infectious disease.     Westley states that his daughter Caswell Corwin (7 y/o) has been "missing" for approximately 1 month. Wtr asks if pt has placed missing persons report and pt shakes his head and states, "I don't know". Pt appeared upset that his daughter was missing and wtr did not pursue directly, but instead stated, "it's helpful to file a missing persons report, if you wanted assistance doing that we'd be happy to help." Pt goes on to state that his Uncle Horris has been missing for approximately 1 week now as well. Pt states this nonchalantly and wtr asks if pt's Uncle Horris goes missing often and pt states that he does.    Larone shared that over the weekend he saw his 2 daughters (Sydney--7th grade, [place]--4th grade) and his son (Sincere--9th grade) as well as his ex-partner Delice Bison visiting from [place]. [place] stated that he spent time with Delice Bison and their children at Tara's sister's house--Anne. Singleton stated, "My son is the perfect little gentlemen, and my two daughters are bright, little angels. I'm so proud of them. I'm so happy for Delice Bison that she has worked so hard, and so proud of the way that she has raised them. She has two older children, aside from the children that we had together, and they are fine examples. Delice Bison is great role model". Arcadio states that his relationship with Delice Bison was never "romantic" or "intimate" but stated, "We just had kids together". Isiah states, "we're good friends".     Wtr transported pt back to his  mother's residence on [address] Pt states that he will call in for random tox screen via Mental Health Court tomorrow morning and then attend AA meeting that wtr recommended on [facility]. Pt states that he will call wtr after meeting to let wtr know "how it went". Dionis asked for recommendation for non-profit organization to complete his 4 hours community service per [employer] as pt missed random tox screen on 02/03/2018. Wtr recommends [facility] on [address] and supplies pt with phone number. Pt states that he will call and set-up community service.     Wtr and pt pull up to pt's mother's residence and pt's Uncle Horris is standing in the driveway smoking a cigarette. Pt states, "well speaking of the devil".  Wtr and pt say goodbyes and pt walks up to his mother's residence and stands in the driveway with his Uncle Horris smoking cigarettes with one another. Wtr left.         Visit Diagnosis:    Major psychotic depression, recurrent- Primary ICD-10-CM: F33.3  ICD-9-CM: 296.34     PTSD (post-traumatic stress disorder) ICD-10-CM: F43.10  ICD-9-CM: 309.81     Alcohol abuse ICD-10-CM: F10.10  ICD-9-CM: 305.00         Interventions:  Continued to develop therapeutic relationship  Motivational Interviewing  Provided therapeutic support during discussion of distressing/traumatic events and/or symptoms  Reviewed and discussed Treatment Plan/Treatment Plan Review  Supportive Psychotherapy    Current Treatment Plan   Created/Updated On 09/29/2017   Next Treatment Plan Due 03/29/2018         Plan:  Other planned interventions or recommendations: ACT Team will continue working closely with pt at this time to support and assist pt in locating natural resources for his CD recovery   Pt agreed to attending AA meetings on Tuesdays and Saturdays on [address] at 12pm.   ACT Team will assist pt in completing applications for half-way houses and treatment apartment programs as pt has identified that it would be instrumental  to have a sober environment to reside in.    NEXT APPT: 02/19/2018      [name], LMSW

## 2018-02-17 NOTE — Progress Notes (Signed)
Behavioral Health Progress Note     Length of Session: 120 minutes    Contact Type:  Location: Off Site    Collateral Contact, Face to Face     Problem(s)/Goals Addressed from Treatment Plan:  Treatment Problem #1 03/27/2017, continued from 09/29/2017   Patient Identified Problem Alc/drugs and instability at pt's s/o's and mother's residences       Treatment Goal #1 03/27/2017, continued from 09/29/2017   Patient Identified Goal "I will get my own apartment asap".      Progress toward goal(s): 01/27/2018: No change, pt continues to reside between both his mother's residence and his s/o's residence, and continues to be unable to save money for his own apartment despite his desire to do so. Pt agreed on 01/26/2018 to have a rep-payee to assist pt in saving money for his own apartment. ACT Team will assist pt by linking him to rep-payee.    1 a. Measurable Objectives : Derek Walker will work with ACT Team at least 1x a month to form a Barrister's clerkmonthly budget, check-in with ongoing management of finances throughout month, and to note any adjustments that may need to be made after reviewing the month's income and overall expenses.  Date established: 03/27/2017 and continued on from this date 09/29/2017  Target date: 03/29/2018  Attained or Revised? Revised    1 b. Measurable Objectives :Derek Walker will meet with ACT staff at least twice monthly to discuss and receive assistance in searching for safe and affordable housing, scheduling tours, making phone calls, completing applications, etc. As demonstrated by Derek Sextonalph having a safe and affordable place to live in the next 6 months.  Date established:09/02/2016 and continued on from this date 09/29/2017  Target date: 03/29/2018  Attained or Revised? Revised    1 c. Measurable Objectives : Derek Walker will continue to utilize clinical appointments with the ACT Team to address communication and conflict with s/o, pt's mother, and  other family members to assist pt in establishing healthy boundary lines and communication styles  Date established: 03/27/2017 and continued on from this date 09/29/2017  Target date: 03/29/2018  Attained or Revised? Revised      ..........................................................................................................................................    Treatment Problem #2 03/27/2017, continued from 09/29/2017   Patient Identified Problem poor physical and mental health       Treatment Goal #2 03/27/2017, continued from 09/29/2017   Patient Identified Goal "I will get my health back in order (attending medical and clinical appointments, nutrition/weight)".       Progress toward goal(s): No change. Comment: Pt has had brief interludes of sobriety and attending clinical appointments but has continued to struggle to attend medical appointments and this continued avoidance has become apoint of excessive anxiety and stress for pt due to ongoing health concerns that are unaddressed.Pt has continued to move towards his goal of successfully [facility]. [facility] but as of this date 09/29/2017 has been terminated due to lack of attendance.    01/27/2018: Pt has continued to struggle to attend probation, medical, mental health, and CD appointments. Pt identifies CD as primary barrier as well as ongoing conflict/stressors with family.     02/02/2018: Pt has first Mental Health Court appearance and is scheduled to attend [facility] orientation on 02/03/2018. Pt reports alcohol use 02/02/2018 at 4am.      2a. Measurable Objectives : I will [facility]  Date  established: 09/02/16  Target date: 09/27/2017  Attained or Revised? Discontinued, see  revised objective 2aa    2aa. Measurable Objective: Cope will address his chemical dependency at the level of care that his sentencing Derek Walker sees appropriate.  Date established: 09/29/2017  Target date: 03/29/2018  Attained or Revised? Discontinued, see revised objected 2aaa    2aaa. Measurable Objective: Derek Walker will address his chemical dependency at the level of care that his sentencing Derek Walker sees appropriate. Pt is currently court ordered to attend [facility] treatment with pt's intial appointment at [facility] Out[facility] is scheduled for 01/28/2018 to complete CD eval.   Date established: 01/27/2018  Target date: 06/2018  Attained or Revise? Revised    2b. Measurable Objectives : Derek Walker will discuss barriers to attending medical and clinical appointments and work with ACT Team to overcome these challenges to increase pt's compliance with treatment recommendation at least 1x a month.  Date established: 03/27/2017 and continued 09/29/2017  Target date: 03/29/2018  Attained or Revised? Revised    2c. Measurable Objectives : Derek Walker will continue to explore and discuss cognitive dissonance between continued use of alc and maintaining sobriety and the effects of pt's alc use on his physical and mental health.  Date established: 03/27/2017 and continued 09/29/2017  Target date: 03/29/2018  Attained or Revised? Revised        Mental Status Exam:  APPEARANCE: Well-groomed, Casual  ATTITUDE TOWARD INTERVIEWER: Cooperative  MOTOR ACTIVITY: WNL (within normal limits)  EYE CONTACT: Direct  SPEECH: Normal rate and tone  AFFECT: Anxious and Pleasant  MOOD: Anxious  THOUGHT PROCESS: Circumstantial  THOUGHT CONTENT: No unusual themes  PERCEPTION: No evidence of hallucinations  CURRENT SUICIDAL IDEATION:  patient denies  CURRENT HOMICIDAL IDEATION: Patient denies  ORIENTATION: Alert and Oriented X 3.  CONCENTRATION: WNL  MEMORY:   Recent: intact   Remote: intact  COGNITIVE FUNCTION: Average intelligence  JUDGMENT: Intact  IMPULSE CONTROL: Fair  INSIGHT: Fair    Risk Assessment:  ASSESSMENT OF RISK FOR SUICIDAL BEHAVIOR  Changes in risk for suicide from baseline Formulation of Risk and/or previous intake, including newly identified risk, if any: none    Session Content::  Wtr clinically represented pt at [employer]. Wtr reviewed with pt beforehand that Derek Walker would most likely review importance of pt timely reporting of drug/alc use as well as Mental Health Court expectation of maintaining abstinence. Derek Walker did review with pt. Pt stated understanding. Pt's next court appearance set for 02/19/2018. Wtr walked pt back to the lobby and wtr reviewed pt's schedule for the remainder of the week. Pt denied any safety concerns at this time. Wtr and pt said goodbyes. Pt left.     Visit Diagnosis:    Major psychotic depression, recurrent- Primary ICD-10-CM: F33.3  ICD-9-CM: 296.34     PTSD (post-traumatic stress disorder) ICD-10-CM: F43.10  ICD-9-CM: 309.81     Alcohol abuse ICD-10-CM: F10.10  ICD-9-CM: 305.00       Interventions:  Collateral contact with:  Derek Walker and Rodena Medin (Mental Health Court coordinator)  Continued to develop therapeutic relationship  Motivational Interviewing  Reviewed and discussed Treatment Plan/Treatment Plan Review   Wtr clinically represented pt at Mental Health Court    Current Treatment Plan   Created/Updated On 09/29/2017   Next Treatment Plan Due 03/29/2018         Plan:  Psychotherapy continues as described in care plan; plan remains the same.    NEXT APPT: 02/16/2018, as  per ACT calendar      [name], LMSW

## 2018-02-19 ENCOUNTER — Ambulatory Visit: Payer: Medicaid Other

## 2018-02-20 ENCOUNTER — Ambulatory Visit: Payer: Medicaid Other | Attending: Infectious Diseases | Admitting: Infectious Diseases

## 2018-02-20 ENCOUNTER — Ambulatory Visit: Payer: Medicaid Other

## 2018-02-20 ENCOUNTER — Encounter: Payer: Self-pay | Admitting: Infectious Diseases

## 2018-02-20 VITALS — BP 110/80 | HR 108 | Temp 98.6°F | Resp 18 | Ht 71.0 in | Wt 188.0 lb

## 2018-02-20 DIAGNOSIS — Z7289 Other problems related to lifestyle: Secondary | ICD-10-CM

## 2018-02-20 DIAGNOSIS — Z79899 Other long term (current) drug therapy: Secondary | ICD-10-CM

## 2018-02-20 DIAGNOSIS — K219 Gastro-esophageal reflux disease without esophagitis: Secondary | ICD-10-CM | POA: Insufficient documentation

## 2018-02-20 DIAGNOSIS — Z789 Other specified health status: Secondary | ICD-10-CM

## 2018-02-20 DIAGNOSIS — B20 Human immunodeficiency virus [HIV] disease: Secondary | ICD-10-CM | POA: Insufficient documentation

## 2018-02-20 DIAGNOSIS — F4310 Post-traumatic stress disorder, unspecified: Secondary | ICD-10-CM

## 2018-02-20 DIAGNOSIS — N529 Male erectile dysfunction, unspecified: Secondary | ICD-10-CM | POA: Insufficient documentation

## 2018-02-20 LAB — CBC AND DIFFERENTIAL
Baso # K/uL: 0 10*3/uL (ref 0.0–0.1)
Basophil %: 0.5 %
Eos # K/uL: 0.1 10*3/uL (ref 0.0–0.5)
Eosinophil %: 1.6 %
Hematocrit: 43 % (ref 40–51)
Hemoglobin: 14.7 g/dL (ref 13.7–17.5)
IMM Granulocytes #: 0 10*3/uL
IMM Granulocytes: 0.2 %
Lymph # K/uL: 1.4 10*3/uL (ref 1.3–3.6)
Lymphocyte %: 33.3 %
MCH: 32 pg/cell (ref 26–32)
MCHC: 34 g/dL (ref 32–37)
MCV: 95 fL — ABNORMAL HIGH (ref 79–92)
Mono # K/uL: 0.8 10*3/uL (ref 0.3–0.8)
Monocyte %: 18.1 %
Neut # K/uL: 2 10*3/uL (ref 1.8–5.4)
Nucl RBC # K/uL: 0 10*3/uL (ref 0.0–0.0)
Nucl RBC %: 0 /100 WBC (ref 0.0–0.2)
Platelets: 171 10*3/uL (ref 150–330)
RBC: 4.5 MIL/uL — ABNORMAL LOW (ref 4.6–6.1)
RDW: 12.5 % (ref 11.6–14.4)
Seg Neut %: 46.3 %
WBC: 4.3 10*3/uL (ref 4.2–9.1)

## 2018-02-20 LAB — COMPREHENSIVE METABOLIC PANEL
ALT: 26 U/L (ref 0–50)
AST: 36 U/L (ref 0–50)
Albumin: 4.8 g/dL (ref 3.5–5.2)
Alk Phos: 68 U/L (ref 40–130)
Anion Gap: 16 (ref 7–16)
Bilirubin,Total: 0.3 mg/dL (ref 0.0–1.2)
CO2: 23 mmol/L (ref 20–28)
Calcium: 9.8 mg/dL (ref 9.0–10.3)
Chloride: 100 mmol/L (ref 96–108)
Creatinine: 0.82 mg/dL (ref 0.67–1.17)
GFR,Black: 126 *
GFR,Caucasian: 109 *
Glucose: 77 mg/dL (ref 60–99)
Lab: 5 mg/dL — ABNORMAL LOW (ref 6–20)
Potassium: 4.5 mmol/L (ref 3.3–5.1)
Sodium: 139 mmol/L (ref 133–145)
Total Protein: 7.9 g/dL — ABNORMAL HIGH (ref 6.3–7.7)

## 2018-02-20 LAB — LYMPHOCYTE SUBSET (T CELLS ONLY)
CD4#: 197 cells/uL — ABNORMAL LOW (ref 496–2186)
CD4%: 15 % — ABNORMAL LOW (ref 32–71)
CD4/CD8: 0.3 — ABNORMAL LOW (ref 0.7–3.0)
CD8#: 723 cells/uL (ref 177–1137)
T LYM #(CD3): 979 cells/uL (ref 754–2810)
T LYM %(CD3): 73 % (ref 54–87)
T Suppress %(CD8): 54 % — ABNORMAL HIGH (ref 10–38)

## 2018-02-20 MED ORDER — OMEPRAZOLE 40 MG PO CPDR *I*
40.0000 mg | DELAYED_RELEASE_CAPSULE | Freq: Every day | ORAL | 0 refills | Status: DC
Start: 2018-02-20 — End: 2019-01-20

## 2018-02-20 MED ORDER — AMMONIUM LACTATE 12 % EX LOTN *I*
TOPICAL_LOTION | Freq: Two times a day (BID) | CUTANEOUS | 5 refills | Status: DC | PRN
Start: 2018-02-20 — End: 2019-01-20

## 2018-02-20 MED ORDER — BICTEGRAVIR-EMTRICITAB-TENOFOV 50-200-25 MG PO TABS *I*
1.0000 | ORAL_TABLET | Freq: Every day | ORAL | 5 refills | Status: DC
Start: 2018-02-20 — End: 2019-01-20

## 2018-02-20 MED ORDER — BICTEGRAVIR-EMTRICITAB-TENOFOV 50-200-25 MG PO TABS *I*
1.0000 | ORAL_TABLET | Freq: Every day | ORAL | 0 refills | Status: DC
Start: 2018-02-20 — End: 2018-02-20

## 2018-02-20 NOTE — Progress Notes (Signed)
Subjective   Derek NielsenRalph D Walker is a 42 y.o. old Male who presents to the Infectious Disease Clinic for follow-up care for HIV disease.    His Case Manager, Alinda MoneyShannon Holcombe accompanied him today.      HPI:   HIV: Currently on HAART: Yes (see medication list below for complete list of medications). Per Self report adherence is> 95% Yes. Missed doses: Past 4 days? Yes. Past 4 weeks? Yes. Barriers to adherence: Yes- ETOH use and insecure housing. Has been off medication for a couple weeks now- prior that reports he was on it while he was incarcerated and in substance abuse treatment program.   He was diagnosed in 2013 from heterosexual contact after he had oral thrush. He was initially prescribed Atripla shortly after dx. Previously he was on Bactrim for prophylaxis, but off since 2014 since CD4 % has been >14%. [facility].      Hx of PTSD, Hx of major psychotic depression: States he is in care with [facility], working with case Investment banker, corporatemanager [name] who accompanies him today. Has had some relapse with ETOH, but is working to get back on track. Has had multiple stressors with family and living situation that have exacerbated ETOH use. Also expresses today that he has never been able to "fully process" his HIV diagnosis and he feels unable to move forward with relationships. States he also is unable to get an erection (alone or with a partner) since his diagnosis.     Any visits to ED/Specialists since last clinic visit? Yes - see chart     Review of Systems   Constitutional: Positive for malaise/fatigue-overall unchanged. Negative for chills, diaphoresis, fever and weight loss.   HENT: Negative.   Eyes: Negative.   Respiratory: Negative.   Cardiovascular: Negative.   Gastrointestinal: Positive for abdominal pain (epigastric), heartburn and nausea- has symptoms multiple times per week, worsened with eating most foods- states appetite has been suppressed. Found omeprazole helpful when this was trialed in  January, but stopped taking once rx ran out. Negative for blood in stool, constipation, diarrhea, and vomiting.   Genitourinary: Negative.   Musculoskeletal: Positive for myalgias. Negative for back pain, joint pain and neck pain.   Skin: Negative for itching and rash (states he has eczema at baseline. He states his current symptoms are at baseline with using ammonium lactate cream which social worker was able to voucher a few weeks ago).   Neurological: States since his ARV change, the tingling and weakness he used to feel in his feet and lower extremities has resolved. Negative for dizziness, tremors, sensory change, speech change, focal weakness and seizures.   Endo/Heme/Allergies: Negative.   Psychiatric/Behavioral: Positive for depression and substance abuse (states overall he is in remission at this time). Negative for hallucinations and suicidal ideas. The patient is not nervous/anxious and does not have insomnia.See HPI re: concerns r/t HIV         Allergies reviewed and updated today: Patient has no known allergies (drug, envir, food or latex).     Medications:    Outpatient Prescriptions Marked as Taking for the 02/20/18 encounter (Office Visit) with Jenna LuoFarrow, Ketzaly Cardella M, NP   Medication Sig Dispense Refill    bictegravir-emtricitabine-tenofovir (BIKTARVY) 50-200-25 MG per tablet Take 1 tablet by mouth daily 30 tablet 5    mirtazapine (REMERON) 7.5 MG tablet TAKE 1 TABLET (7.5MG  TOTAL) BY MOUTH NIGHTLY. 30 tablet 1    OLANZapine (ZYPREXA) 10 MG tablet Take 1 tablet (10 mg total)  by mouth nightly   TDD 15mg  30 tablet 2    gabapentin (GABAPENTIN) 100MG  capsule Take 2 capsules (200 mg total) by mouth 3 times daily   For anxiety 180 capsule 2    mirtazapine (REMERON) 15 MG tablet TAKE 1 TABLET (15MG  TOTAL) BY MOUTH NIGHTLY. 30 tablet 5    OLANZapine (ZYPREXA) 5 MG tablet TAKE 1 TABLET (5MG  TOTAL) BY MOUTH EVERY MORNING. 30 tablet 5       Medications marked discontinued were active as of onset of Today's  encounter. Due to system issues, they may have been marked discontinued when renewed   Social History     Social History    Marital status: Divorced     Spouse name: N/A    Number of children: N/A    Years of education: N/A     Social History Main Topics    Smoking status: Current Every Day Smoker     Packs/day: 0.50     Years: 25.00     Types: Cigarettes     Start date: 1990    Smokeless tobacco: Never Used      Comment: 5 cigarettes/day    Alcohol use 3.6 oz/week     6 Cans of beer per week    Drug use: Yes     Types: Marijuana    Sexual activity: Not Currently     Partners: Female     Birth control/ protection: None     Other Topics Concern    Not on file     Social History Narrative    Has 3 adult daughters and one young daughter (born ~2010) who lives with her mother in [place].      Possibly looking to go to [employer] to study car mechanics as of Sept 2017.        Number of sexual partners in the past 6 months - 0   Partner(s) knowledge of patient's HIV status - not applicable   Partner(s)' HIV status - N/a   Partner's history of HIV testing - N/A   Using condoms - n/a     Objective :   Physical Exam   Constitutional: He is oriented to person, place, and time. He appears well-developed and well-nourished. No distress.   HENT:   Head: Normocephalic and atraumatic.   Right Ear: External ear normal.   Left Ear: External ear normal.   Mouth/Throat: Oropharynx is clear and moist. No oropharyngeal exudate.   Eyes: Right eye exhibits no discharge. Left eye exhibits no discharge. No scleral icterus.   Neck: Normal range of motion. No tracheal deviation present. No thyromegaly present.   Cardiovascular: Normal rate, regular rhythm and normal heart sounds. Exam reveals no gallop and no friction rub.   No murmur heard.   Pulmonary/Chest: Effort normal and breath sounds normal. No stridor. No respiratory distress. He has no wheezes. He has no rales.   Abdominal: Soft. Bowel sounds are normal. He exhibits no  distension and no mass or tenderness. There is no rebound and no guarding.   Musculoskeletal: Normal range of motion. He exhibits no edema or deformity.   Lymphadenopathy:   He has no cervical adenopathy.   Neurological: He is alert and oriented to person, place, and time.   Skin: Skin is warm and dry. No rash noted today. He is not diaphoretic. No erythema.   Vitals reviewed.     Vitals:   Vitals:    02/20/18 1122   BP: 110/80   Pulse:  108   Resp: 18   Temp: 37 C (98.6 F)   Weight: 85.3 kg (188 lb)   Height: 1.803 m (5\' 11" )     Pain    02/20/18 1122   PainSc:   0 - No pain     Body mass index is 26.22 kg/m.        Results:  CD4 -   Lab Results   Component Value Date    CD4A 197 (L) 02/20/2018    CD4A 307 (L) 09/19/2017    CD4A 166 (L) 05/06/2017     Viral load -   Lab Results   Component Value Date    HIV NEG 09/19/2017    HIV NEG 05/06/2017    HIV NEG 11/04/2016         Assessment/Plan :   1. HIV: Here for follow-up for HIV disease   ART: On ART consisting of: Biktarvy (TAF/FTC/BTG). Stable continue current medication (patient had brief lapse for the last 2 weeks, resume today)  Safer sexual practices were discussed with the patient: Yes   CBC, CD4, viral load, CMP ordered today   T-cells 197, percentage is over 14, suspect this will improve quickly with resuming medication, so we will defer starting OI prophylaxis for now, will reassess in 3-4 weeks   Medication adherence counseling was performed : Yes. Discussed at length    2. Health maintenance:   Vaccinations: Due for Menactra, but not in stock today, will offer at next visit.   Dental: Overdue for FUV, seen at [facility]- aware he needs to re-book   Opthalmology: [facility], last seen Feb 2018, states he has catracts in both eyes and has plan to follow-up regarding this. Declines need for referral at this time  Lipid screen: stable 05/06/17  A1C WNL 05/06/17  STD screen: Declines need for GC/CT screening- had negative urine screen 09/19/17,  Syphilis screen sent 05/06/17, NEG.   UA stable 05/06/17  TB Ag Stim test WNL 05/06/18    3. Healthy Lifestyle Counseling:   The patient is advised to quit smoking, begin progressive daily aerobic exercise program, follow a low fat, low cholesterol diet, attempt to lose weight, decrease or avoid alcohol intake, continue current medications, continue current healthy lifestyle patterns and return for routine annual checkups.    4. Hx of PTSD, substance abuse, anxiety with depression: Continue medications and care per mental health.   Re: coping with HIV, we discussed his good prognosis with continued treatment, U=U concept, etc- he states he was already aware of these things, but still has difficulty coping.   Discussed men's group through [facility], gave case worker the phone number  Also discussed that there are some services available through [facility] case manager can look into and review with patient    5. Tobacco dependence: Three plus minutes of smoking cessation counseling done at prior visit- reinforced today. Risks of smoking reviewed including possible lung disease (COPD, cancer) and cardiac disease. Options of cessation aides reviewed including nicotine patch, hypnosis, and possible prescription medications. Patient encouraged to follow-up with outpatient provider.   6. Hx of eczema: Continue topicals per PCP.     7. GERD: Resume omeprazole 40mg  daily, counseled about ETOH avoidance. F/U with PCP.     8. Erectile Dysfunctions: Discussed possible causes. Will send early AM testosterone to rule out hypogonadism, advised if low, we will need to repeat value and if still low we can start testosterone replacement through our clinic or his PCP (  if they offer this). Advised if WNL, there would be no benefit to testosterone treatment and he should speak with PCP about possible medication side effect (which could potentially be mitigated by ED medications).     Return to clinic:in 1 month(s) or sooner if needed.      Jenna Luo, NP 02/20/2018 4:44 PM

## 2018-02-20 NOTE — Progress Notes (Signed)
Behavioral Health Progress Note     Length of Session: 45 minutes    Contact Type:  Location: Off Site    Collateral Contact, Face to Face     Problem(s)/Goals Addressed from Treatment Plan:  Treatment Problem #1 03/27/2017, continued from 09/29/2017   Patient Identified Problem Alc/drugs and instability at pt's s/o's and mother's residences       Treatment Goal #1 03/27/2017, continued from 09/29/2017   Patient Identified Goal "I will get my own apartment asap".      Progress toward goal(s): 01/27/2018: No change, pt continues to reside between both his mother's residence and his s/o's residence, and continues to be unable to save money for his own apartment despite his desire to do so. Pt agreed on 01/26/2018 to have a rep-payee to assist pt in saving money for his own apartment. ACT Team will assist pt by linking him to rep-payee.    1 a. Measurable Objectives : Derek Walker will work with ACT Team at least 1x a month to form a Barrister's clerkmonthly budget, check-in with ongoing management of finances throughout month, and to note any adjustments that may need to be made after reviewing the month's income and overall expenses.  Date established: 03/27/2017 and continued on from this date 09/29/2017  Target date: 03/29/2018  Attained or Revised? Revised    1 b. Measurable Objectives :Derek Walker will meet with ACT staff at least twice monthly to discuss and receive assistance in searching for safe and affordable housing, scheduling tours, making phone calls, completing applications, etc. As demonstrated by Derek Sextonalph having a safe and affordable place to live in the next 6 months.  Date established:09/02/2016 and continued on from this date 09/29/2017  Target date: 03/29/2018  Attained or Revised? Revised    1 c. Measurable Objectives : Derek Walker will continue to utilize clinical appointments with the ACT Team to address communication and conflict with s/o, pt's mother, and  other family members to assist pt in establishing healthy boundary lines and communication styles  Date established: 03/27/2017 and continued on from this date 09/29/2017  Target date: 03/29/2018  Attained or Revised? Revised      ..........................................................................................................................................    Treatment Problem #2 03/27/2017, continued from 09/29/2017   Patient Identified Problem poor physical and mental health       Treatment Goal #2 03/27/2017, continued from 09/29/2017   Patient Identified Goal "I will get my health back in order (attending medical and clinical appointments, nutrition/weight)".       Progress toward goal(s): No change. Comment: Pt has had brief interludes of sobriety and attending clinical appointments but has continued to struggle to attend medical appointments and this continued avoidance has become apoint of excessive anxiety and stress for pt due to ongoing health concerns that are unaddressed.Pt has continued to move towards his goal of successfully completing [facility]. [facility] but as of this date 09/29/2017 has been terminated due to lack of attendance.    01/27/2018: Pt has continued to struggle to attend probation, medical, mental health, and CD appointments. Pt identifies CD as primary barrier as well as ongoing conflict/stressors with family.     02/02/2018: Pt has first Mental Health Court appearance and is scheduled to attend [facility] Inpatient orientation on 02/03/2018. Pt reports alcohol use 02/02/2018 at 4am.      2a. Measurable Objectives : I will [name]  Date  established: 09/02/16  Target date: 09/27/2017  Attained or Revised? Discontinued, see  revised objective 2aa    2aa. Measurable Objective: Derek Walker will address his chemical dependency at the level of care that his sentencing Derek Walker sees appropriate.  Date established: 09/29/2017  Target date: 03/29/2018  Attained or Revised? Discontinued, see revised objected 2aaa    2aaa. Measurable Objective: Derek Walker will address his chemical dependency at the level of care that his sentencing Derek Walker sees appropriate. Pt is currently court ordered to attend [facility] treatment with pt's intial appointment at [facility] CD Outpatient is scheduled for 01/28/2018 to complete CD eval.   Date established: 01/27/2018  Target date: 06/2018  Attained or Revise? Revised    2b. Measurable Objectives : Derek Walker will discuss barriers to attending medical and clinical appointments and work with ACT Team to overcome these challenges to increase pt's compliance with treatment recommendation at least 1x a month.  Date established: 03/27/2017 and continued 09/29/2017  Target date: 03/29/2018  Attained or Revised? Revised    2c. Measurable Objectives : Derek Walker will continue to explore and discuss cognitive dissonance between continued use of alc and maintaining sobriety and the effects of pt's alc use on his physical and mental health.  Date established: 03/27/2017 and continued 09/29/2017  Target date: 03/29/2018  Attained or Revised? Revised        Mental Status Exam:  APPEARANCE: Well-groomed, Casual  ATTITUDE TOWARD INTERVIEWER: Cooperative  MOTOR ACTIVITY: WNL (within normal limits)  EYE CONTACT: Direct  SPEECH: Normal rate and tone  AFFECT: Anxious, Pleasant  MOOD: Anxious  THOUGHT PROCESS: Circumstantial, Concrete, Flight of ideas and Goal directed  THOUGHT CONTENT: No unusual themes  PERCEPTION: WNL  CURRENT  SUICIDAL IDEATION: patient denies  CURRENT HOMICIDAL IDEATION: Patient denies  ORIENTATION: Alert and Oriented X 3.  CONCENTRATION: WNL  MEMORY:   Recent: intact   Remote: intact  COGNITIVE FUNCTION: Average intelligence  JUDGMENT: Intact  IMPULSE CONTROL: Good  INSIGHT: Fair    Risk Assessment:  ASSESSMENT OF RISK FOR SUICIDAL BEHAVIOR  Changes in risk for suicide from baseline Formulation of Risk and/or previous intake, including newly identified risk, if any:   none    Session Content::  Wtr clinically represented pt at [employer] Pt reported no drug/alc use since last alcohol use 02/14/2018 and last cannabis use 02/07/2018. Derek Walker reinforced Mental Health Court's expectation for pt to maintain abstinence. Pt was present at court with his s/o Derek Walker. Pt was adjourned to next appear in [facility] 03/05/2018, continuing random tox screens Mon-Fri. Pt denied any safety concerns at this time.    Visit Diagnosis:    Major psychotic depression, recurrent- Primary ICD-10-CM: F33.3  ICD-9-CM: 296.34     PTSD (post-traumatic stress disorder) ICD-10-CM: F43.10  ICD-9-CM: 309.81     Alcohol abuse ICD-10-CM: F10.10  ICD-9-CM: 305.00       Interventions:  Collateral contact with:  Derek Walker and Rodena Medin & Amy [phone] (Mental Health Court Coordinators)(  Continued to develop therapeutic relationship  Wtr clinically represtend pt in Mental Health Court   Collateral contact with: pt's s/o Derek Walker    Current Treatment Plan   Created/Updated On 09/29/2017   Next Treatment Plan Due 03/29/2018       Plan:  Other planned interventions or recommendations: ACT Team will transport pt from [facility] outpatient group to probation and ID clinic appointment    NEXT APPT: 02/20/2018, as per ACT calendar      [name],  Evelena Peat, LMSW

## 2018-02-20 NOTE — Patient Instructions (Signed)
Individual Therapy  Joseph G Gentile LCSW, CGP    Address: [address], [place], [place] [address]  [phone]

## 2018-02-23 ENCOUNTER — Ambulatory Visit: Payer: Medicaid Other | Admitting: Psychiatry

## 2018-02-23 DIAGNOSIS — F333 Major depressive disorder, recurrent, severe with psychotic symptoms: Secondary | ICD-10-CM

## 2018-02-23 NOTE — Progress Notes (Signed)
Behavioral Health Psychopharmacology Follow-up     Duration:  15 minutes      Diagnosis Addressed    ICD-10-CM ICD-9-CM   1. Major psychotic depression, recurrent F33.3 296.34       Chief Complaint: Patient was seen for follow-up    There were no vitals taken for this visit.  Wt Readings from Last 3 Encounters:   02/20/18 85.3 kg (188 lb)   09/19/17 73 kg (161 lb)   04/16/17 74.4 kg (164 lb)       Recent History and Response to Medications  [name] was seen today for psychopharmacological follow-up at his residence. He was preparing to move out at the time and unsure of where he will be relocating to. He stated that he can be reached at his mother's home in the mean time. Pt noted that he has not had his medications for about a month. Denied worsening of affective and psychotic symptoms in the interim. No lethality concerns. Pt denied other recent psychosocial stressors nor physical concerns at the time.     Current use of alcohol or drugs: yes alcohol use    ENERGY: Fair  SLEEP: Not assessed at this time  APPETITE: Not assessed at this time  WEIGHT: Not assessed at this time  SEXUAL FUNCTION: not asked  ENJOYMENT/INTEREST: Decreased    Review of Systems:  Pertinent items are noted in HPI.    Current Medications  Current Outpatient Prescriptions   Medication Sig    omeprazole (PRILOSEC) 40 MG capsule Take 1 capsule (40 mg total) by mouth daily    ammonium lactate (LAC-HYDRIN) 12 % lotion Apply topically 2 times daily as needed for Dry Skin    bictegravir-emtricitabine-tenofovir (BIKTARVY) 50-200-25 MG per tablet Take 1 tablet by mouth daily    mirtazapine (REMERON) 7.5 MG tablet TAKE 1 TABLET (7.5MG  TOTAL) BY MOUTH NIGHTLY.    OLANZapine (ZYPREXA) 10 MG tablet Take 1 tablet (10 mg total) by mouth nightly   TDD 15mg     gabapentin (GABAPENTIN) 100MG  capsule Take 2 capsules (200 mg total) by mouth 3 times daily   For anxiety    mirtazapine (REMERON) 15 MG tablet TAKE 1 TABLET (15MG  TOTAL) BY MOUTH  NIGHTLY.    OLANZapine (ZYPREXA) 5 MG tablet TAKE 1 TABLET (5MG  TOTAL) BY MOUTH EVERY MORNING.    naltrexone (DEPADE) 50 MG tablet TAKE 1 TABLET (50MG  TOTAL) BY MOUTH DAILY    sulfamethoxazole-trimethoprim (BACTRIM DS,SEPTRA DS) 800-160 MG per tablet Take 1 tablet by mouth daily    efavirenz-emtrictabine-tenofovir (ATRIPLA) 600-200-300 MG per tablet Take 1 tablet by mouth nightly on an empty stomach     No current facility-administered medications for this visit.        Side Effects  Unable to be assessed at the time    Mental Status  APPEARANCE: Appears stated age, Casual  ATTITUDE TOWARD INTERVIEWER: Cooperative  MOTOR ACTIVITY: Not formally assessed, no tremor appreciated, gait normal  EYE CONTACT: Direct  SPEECH: Normal rate and tone  AFFECT: Full Range and Constricted  MOOD: Euthymic  THOUGHT PROCESS: Provided sequential, goal-directed responses to questions  THOUGHT CONTENT: Appropriate to topic of conversation  PERCEPTION: Did not appear to be responding to internal stimuli during interaction  ORIENTATION: Alert and oriented to situation  CONCENTRATION: Fair  MEMORY:   Recent: intact   Remote: intact  COGNITIVE FUNCTION: Average intelligence  JUDGMENT: Impaired -  minimal  IMPULSE CONTROL: Fair to Poor  INSIGHT: Some insight into experiencing anxiety, limited insight  into substance use    Risk Assessment  Self Injury: Patient Denies  Suicidal Ideation: Patient Denies  Homicidal Ideation: Patient Denies (denied acute homicidal ideation)  Aggressive Behavior: Patient Denies (denied acute aggressive behavior)    If any of the answers above are Yes, is there access to lethal means?   N/A    [place] Scale administered? N/A    Results  All labs in the last 72 hours:No results found for this or any previous visit (from the past 72 hour(s)).    Current Treatment Plan   Created/Updated On 02/21/2016   Next Treatment Plan Due 08/22/2016       Assessment:  Derek Walker is a 42 year old male with a past history  significant for prior diagnoses of major depressive disorder, recurrent, with psychotic features, PTSD, and alcohol use disorder who was seen today for psychopharmacological follow-up.  Derek Walker expressed concern that he has not had his psychotropic medication regimen for a time period but denied psychiatric decompensation. Plan is to continue with current medication regimen and continue to monitor response once restarted. Based on assessment today, patient remains appropriate for continued Assertive Community Treatment (ACT) team care as he has a history of minimal engagement with outpatient mental health contributing to mental health decompensation.     Plan and Rationale:  -Continue Olanzapine 5 mg in the morning, 20 mg at night for mood stabilization, psyhosis  -Continue Mirtazapine 22.5 mg nightly for depression  -Continue Gabapentin 100 mg TID for anxiety  -Continue Naltrexone 50 mg daily for alcohol use disorder  -Continue motivational interview techniques to help address alcohol use  -Follows with infectious disease  -Follow-up with ACT team

## 2018-02-24 ENCOUNTER — Ambulatory Visit: Payer: Medicaid Other

## 2018-02-24 LAB — HIV VIRAL LOAD
HIV-1 PCR log: 3.6 copies/mL
HIV-1 PCR: 4182 copies/mL

## 2018-02-24 NOTE — Progress Notes (Signed)
Behavioral Health Progress Note     Length of Session: 170 minutes    Contact Type:  Location: Off Site    Collateral Contact, Face to Face     Problem(s)/Goals Addressed from Treatment Plan:  Treatment Problem #1 03/27/2017, continued from 09/29/2017   Patient Identified Problem Alc/drugs and instability at pt's s/o's and mother's residences       Treatment Goal #1 03/27/2017, continued from 09/29/2017   Patient Identified Goal "I will get my own apartment asap".      Progress toward goal(s): 01/27/2018: No change, pt continues to reside between both his mother's residence and his s/o's residence, and continues to be unable to save money for his own apartment despite his desire to do so. Pt agreed on 01/26/2018 to have a rep-payee to assist pt in saving money for his own apartment. ACT Team will assist pt by linking him to rep-payee.    1 a. Measurable Objectives : Kiet will work with ACT Team at least 1x a month to form a Artist, check-in with ongoing management of finances throughout month, and to note any adjustments that may need to be made after reviewing the month's income and overall expenses.  Date established: 03/27/2017 and continued on from this date 09/29/2017  Target date: 03/29/2018  Attained or Revised? Revised    1 b. Measurable Objectives :Kendrick will meet with ACT staff at least twice monthly to discuss and receive assistance in searching for safe and affordable housing, scheduling tours, making phone calls, completing applications, etc. As demonstrated by [name] having a safe and affordable place to live in the next 6 months.  Date established:09/02/2016 and continued on from this date 09/29/2017  Target date: 03/29/2018  Attained or Revised? Revised    1 c. Measurable Objectives : Dimitrios will continue to utilize clinical appointments with the ACT Team to address communication and conflict with s/o, pt's mother, and  other family members to assist pt in establishing healthy boundary lines and communication styles  Date established: 03/27/2017 and continued on from this date 09/29/2017  Target date: 03/29/2018  Attained or Revised? Revised      ..........................................................................................................................................    Treatment Problem #2 03/27/2017, continued from 09/29/2017   Patient Identified Problem poor physical and mental health       Treatment Goal #2 03/27/2017, continued from 09/29/2017   Patient Identified Goal "I will get my health back in order (attending medical and clinical appointments, nutrition/weight)".       Progress toward goal(s): No change. Comment: Pt has had brief interludes of sobriety and attending clinical appointments but has continued to struggle to attend medical appointments and this continued avoidance has become apoint of excessive anxiety and stress for pt due to ongoing health concerns that are unaddressed.Pt has continued to move towards his goal of successfully completing outpatient CD treatment at [facility]. Pt wasin Phase II of treatment at [facility] but as of this date 09/29/2017 has been terminated due to lack of attendance.    01/27/2018: Pt has continued to struggle to attend probation, medical, mental health, and CD appointments. Pt identifies CD as primary barrier as well as ongoing conflict/stressors with family.     02/02/2018: Pt has first [place] appearance and is scheduled to attend [facility] on 02/03/2018. Pt reports alcohol use 02/02/2018 at 4am.      2a. Measurable Objectives : I will successfully complete [place] [facility] treatment  Date  established: 09/02/16  Target date: 09/27/2017  Attained or Revised? Discontinued, see  revised objective 2aa    2aa. Measurable Objective: Javion will address his chemical dependency at the level of care that his sentencing [name] sees appropriate.  Date established: 09/29/2017  Target date: 03/29/2018  Attained or Revised? Discontinued, see revised objected 2aaa    2aaa. Measurable Objective: Kabe will address his chemical dependency at the level of care that his sentencing [facility] sees appropriate. Pt is currently court ordered to attend [facility] with pt's intial appointment at [facility] is scheduled for 01/28/2018 to complete CD eval.   Date established: 01/27/2018  Target date: 06/2018  Attained or Revise? Revised    2b. Measurable Objectives : Mynor will discuss barriers to attending medical and clinical appointments and work with ACT Team to overcome these challenges to increase pt's compliance with treatment recommendation at least 1x a month.  Date established: 03/27/2017 and continued 09/29/2017  Target date: 03/29/2018  Attained or Revised? Revised    2c. Measurable Objectives : Tevan will continue to explore and discuss cognitive dissonance between continued use of alc and maintaining sobriety and the effects of pt's alc use on his physical and mental health.  Date established: 03/27/2017 and continued 09/29/2017  Target date: 03/29/2018  Attained or Revised? Revised      Mental Status Exam:  APPEARANCE: Well-groomed, Casual  ATTITUDE TOWARD INTERVIEWER: Cooperative  MOTOR ACTIVITY: WNL (within normal limits)  EYE CONTACT: Direct  SPEECH: Normal rate and tone  AFFECT: Anxious and Depressed, Pleasant  MOOD: Anxious and Depressed  THOUGHT PROCESS: Circumstantial, Goal directed, Loose associations and Tangential  THOUGHT CONTENT: Negative  Rumination  PERCEPTION: No evidence of hallucinations  CURRENT SUICIDAL IDEATION: patient denies  CURRENT HOMICIDAL IDEATION: Patient denies  ORIENTATION: Alert and Oriented X 3.  CONCENTRATION: WNL  MEMORY:   Recent: intact   Remote: intact  COGNITIVE FUNCTION: Average intelligence  JUDGMENT: Impaired -  minimal  IMPULSE CONTROL: Fair  INSIGHT: Fair    Risk Assessment:  ASSESSMENT OF RISK FOR SUICIDAL BEHAVIOR  Changes in risk for suicide from baseline Formulation of Risk and/or previous intake, including newly identified risk, if any: none    Session Content::  Wtr [facility], pt was standing outside wearing light grey sweat suit with a white t-shirt and sneakers, smoking a cigarette waiting for wtr's arrival. Wtr transported pt to probation appointment. Pt stated that his number was not called for random tox screen on this date. Wtr and pt attended probation appointment, pt's assigned probation officer [name], PO was in a meeting and wtr and pt met with another PO. Pt reported no alc/drug use ad no police contact. Pt was scheduled for 02/27/2018 for next probation appointment.    Wtr transported pt to ID clinic at [facility]. Wtr attended appointment with pt as pt requested. Wtr asked ID clinic provider, Craig Guess. Wallie Char, NP to provide psychoeducation to [place] per Talmage's concerns of dying from ID diagnosis. Craig Guess. Wallie Char, NP reviewed with pt that as long as pt was taking medication and the virus was undetectable, that research shows individuals living as long and quality-life as general population as well as reviewed sexual health-- low transmittance of HIV as long as pt was on medications, virus was undetectable, and neither himself or sexual partner had a STD. Pt stated that "he was broken" referring to not being able to achieve an erection and  Craig Guess. Farrow,NP suggested that pt complete two testosterone-level labs. Pt stated that he was unable to  achieve an erection on his own as well as not with a partner. Renato Gails, NP suggested that this may point to it being physiological in nature, but the testosterone-level labs would give more information. Wallie Char, NP also stated that these labs needed to be completed first thing in the morning and pt stated that he could do so on Tuesdays and Thursdays as these are days that pt does not have [facility]. Pt appeared to be minimally relieved by education and will most likely need continued support and therapy addressing pt's concerns/fears re: ID Dx. Wallie Char, NP stated that there was also a men's support group for those with HIV diagnosis that pt could attend ran by Marylou Flesher on Thursday evenings  [phone]. Pt stated minimal interest and stated that he was interested in participating in study that flyer mentioned when wtr and pt checked in at ID clinic. Another ID clinic staff person entered and stated that pt would not be able to participate in study until he had better medication adherence, pt received well     Wtr transported pt to his mother's residence on [address] stated that [place] had received an eviction notice and may move sometime next week, week of 02/23/18 and that his mother was considering moving in the near future as well. Pt stated no safety concerns at this time. Wtr reviewed safety plan with pt re: pt's drug/alc use and pt was receptive and stated motivation to maintain abstinence. Wtr and pt said goodbyes and wtr left.     Visit Diagnosis:    Major psychotic depression, recurrent- Primary ICD-10-CM: F33.3  ICD-9-CM: 296.34     PTSD (post-traumatic stress disorder) ICD-10-CM: F43.10  ICD-9-CM: 309.81     Alcohol abuse ICD-10-CM: F10.10  ICD-9-CM: 305.00         Interventions:  Collateral contact with:  pt's ID clinic provider and Probation officer  Continued to develop therapeutic relationship  Provided therapeutic support during discussion of distressing/traumatic events  and/or symptoms  Supportive Psychotherapy  Wtr transported pt from [place] group to probation and then onto ID clinic appointment. Wtr transported pt back to his s/o's residence on [address] after appointment.    Current Treatment Plan   Created/Updated On 09/29/2017   Next Treatment Plan Due 03/29/2018         Plan:  Other planned interventions or recommendations: ACT Team will continue to support pt's medication adherence.    NEXT APPT: 02/24/2018, as per ACT calendar      Danise Edge, LMSW

## 2018-02-27 ENCOUNTER — Ambulatory Visit: Payer: Medicaid Other

## 2018-02-28 LAB — HIV-1 GENOTYPING

## 2018-03-02 NOTE — Progress Notes (Signed)
[facility] MISSED/CANCELLED APPOINTMENT     Name: Derek NielsenRALPH D Walker  MRN: [id number]   DOB: 1976-06-18    Date of Scheduled Service: 6/11    Derek Walker was a no show for today's appointment.  I have discussed today's appointment with my supervisor.    Additional Information:    Derek Walker could not be found as he and his girlfriend moved the night before. No one knew the new address. His medication was dropped off with his mother.

## 2018-03-03 NOTE — Progress Notes (Signed)
Behavioral Health Progress Note     Length of Session: 45 minutes    Contact Type:  Location: Off Site    Face to Face     Problem(s)/Goals Addressed from Treatment Plan:  Treatment Problem #1 03/27/2017, continued from 09/29/2017   Patient Identified Problem Alc/drugs and instability at pt's s/o's and mother's residences       Treatment Goal #1 03/27/2017, continued from 09/29/2017   Patient Identified Goal "I will get my own apartment asap".      Progress toward goal(s): 01/27/2018: No change, pt continues to reside between both his mother's residence and his s/o's residence, and continues to be unable to save money for his own apartment despite his desire to do so. Pt agreed on 01/26/2018 to have a rep-payee to assist pt in saving money for his own apartment. ACT Team will assist pt by linking him to rep-payee.    1 a. Measurable Objectives : Lyndel will work with ACT Team at least 1x a month to form a Artist, check-in with ongoing management of finances throughout month, and to note any adjustments that may need to be made after reviewing the month's income and overall expenses.  Date established: 03/27/2017 and continued on from this date 09/29/2017  Target date: 03/29/2018  Attained or Revised? Revised    1 b. Measurable Objectives :Brandan will meet with ACT staff at least twice monthly to discuss and receive assistance in searching for safe and affordable housing, scheduling tours, making phone calls, completing applications, etc. As demonstrated by [name] having a safe and affordable place to live in the next 6 months.  Date established:09/02/2016 and continued on from this date 09/29/2017  Target date: 03/29/2018  Attained or Revised? Revised    1 c. Measurable Objectives : Yusif will continue to utilize clinical appointments with the ACT Team to address communication and conflict with s/o, pt's mother, and other family members to  assist pt in establishing healthy boundary lines and communication styles  Date established: 03/27/2017 and continued on from this date 09/29/2017  Target date: 03/29/2018  Attained or Revised? Revised      ..........................................................................................................................................    Treatment Problem #2 03/27/2017, continued from 09/29/2017   Patient Identified Problem poor physical and mental health       Treatment Goal #2 03/27/2017, continued from 09/29/2017   Patient Identified Goal "I will get my health back in order (attending medical and clinical appointments, nutrition/weight)".       Progress toward goal(s): No change. Comment: Pt has had brief interludes of sobriety and attending clinical appointments but has continued to struggle to attend medical appointments and this continued avoidance has become apoint of excessive anxiety and stress for pt due to ongoing health concerns that are unaddressed.Pt has continued to move towards his goal of successfully completing outpatient CD treatment at [facility]. Pt wasin Phase II of treatment at [facility] but as of this date 09/29/2017 has been terminated due to lack of attendance.    01/27/2018: Pt has continued to struggle to attend probation, medical, mental health, and CD appointments. Pt identifies CD as primary barrier as well as ongoing conflict/stressors with family.     02/02/2018: Pt has first [place] appearance and is scheduled to attend [facility] on 02/03/2018. Pt reports alcohol use 02/02/2018 at 4am.      2a. Measurable Objectives : I will successfully complete [place] Chemical Dependency treatment  Date established: 09/02/16  Target date: 09/27/2017  Attained or Revised? Discontinued, see revised objective  2aa    2aa. Measurable Objective: Vinicio will address his chemical dependency at the level of care that his [name] sees appropriate.  Date established: 09/29/2017  Target date: 03/29/2018  Attained or Revised? Discontinued, see revised objected 2aaa    2aaa. Measurable Objective: Antonius will address his chemical dependency at the level of care that his sentencing [facility] sees appropriate. Pt is currently court ordered to attend [facility] with pt's intial appointment at [place] [facility] is scheduled for 01/28/2018 to complete CD eval.   Date established: 01/27/2018  Target date: 06/2018  Attained or Revise? Revised    2b. Measurable Objectives : Pauline will discuss barriers to attending medical and clinical appointments and work with ACT Team to overcome these challenges to increase pt's compliance with treatment recommendation at least 1x a month.  Date established: 03/27/2017 and continued 09/29/2017  Target date: 03/29/2018  Attained or Revised? Revised    2c. Measurable Objectives : Edinson will continue to explore and discuss cognitive dissonance between continued use of alc and maintaining sobriety and the effects of pt's alc use on his physical and mental health.  Date established: 03/27/2017 and continued 09/29/2017  Target date: 03/29/2018  Attained or Revised? Revised      Mental Status Exam:  APPEARANCE: Casual  ATTITUDE TOWARD INTERVIEWER: Cooperative  MOTOR ACTIVITY: WNL (within normal limits)  EYE CONTACT: Direct  SPEECH: Normal rate and tone  AFFECT: Anxious and Pleasant  MOOD: Anxious  THOUGHT PROCESS: Circumstantial and Goal directed  THOUGHT CONTENT: Negative Rumination  PERCEPTION: No evidence of hallucinations  CURRENT SUICIDAL IDEATION: patient  denies  CURRENT HOMICIDAL IDEATION: Patient denies  ORIENTATION: Alert and Oriented X 3.  CONCENTRATION: WNL  MEMORY:   Recent: intact   Remote: intact  COGNITIVE FUNCTION: Average intelligence  JUDGMENT: Impaired -  mild  IMPULSE CONTROL: Poor  INSIGHT: Fair    Risk Assessment:  ASSESSMENT OF RISK FOR SUICIDAL BEHAVIOR  Changes in risk for suicide from baseline Formulation of Risk and/or previous intake, including newly identified risk, if any: none    Session Content::  Wtr called and texted pt to learn pt's whereabouts so that wtr and pt could meet. Pt stated that he had missed [facility] and had already attended probation and was at his mother's residence on [address] and was agreeable to meeting wtr there. Wtr obliged and met pt at his mother's residence and asked pt to come outside to meet with wtr in wtr's work vehicle, parked several houses down. Pt came outside and stated that he had missed the bus this morning 02/27/2018 to attend [facility] group and caught the next bus and went straight to probation appointment. Wtr reviewed with pt that he was scheduled to attend probation appointment with ACT Team. Pt apologized for not updating wtr of change of plans.     Pt stated that reason he missed the bus this morning was because he took a Tylenol PM the night previous. Wtr challenged pt, as wtr could smell alcohol on patient. Pt at first denied, and eventually admitted that in fact he had not taken a Tylenol PM but had drank alcohol to the point of intoxication the evening of 02/26/2018, and thus woke up late this morning and missed group. Wtr and pt called [facility]'s [facility] counselor to touch base and wtr asked counselor to send an email to  wtr with update of pt's progress/challenges at CD outpatient treatment. Wtr provided motivational interviewing and pt responded well. Wtr reviewed plan for the weekend to cope with triggers and maintain abstinence. Pt stated motivation to do so.  Wtr and pt said goodbyes and wtr left.     Visit Diagnosis:    Major psychotic depression, recurrent- Primary ICD-10-CM: F33.3  ICD-9-CM: 296.34     PTSD (post-traumatic stress disorder) ICD-10-CM: F43.10  ICD-9-CM: 309.81     Alcohol abuse ICD-10-CM: F10.10  ICD-9-CM: 305.00         Interventions:  Continued to develop therapeutic relationship  Motivational Interviewing  Provided therapeutic support during discussion of distressing/traumatic events and/or symptoms  Reviewed and discussed Treatment Plan/Treatment Plan Review  Safety Planning.  Refer to safety plan document that was reviewed at today's visit.  Supportive Psychotherapy    Current Treatment Plan   Created/Updated On 09/29/2017   Next Treatment Plan Due 03/29/2018         Plan:  Other planned interventions or recommendations: Wtr encouraged pt to check-in with wtr on 03/02/18 after group, to leave voicemail with wtr to report any drug/alc use if occurred during the weekend (per [place] Elliott's orders), and to call Emergency On-Call ACT Team over the weekend if needing additional support, specifically as it pertains to pt's attempt to maintain abstinence    NEXT APPT: 03/05/2018, as per ACT calendar      Danise Edge, LMSW

## 2018-03-05 ENCOUNTER — Ambulatory Visit: Payer: Medicaid Other

## 2018-03-05 LAB — HIV-1 INTEGRASE INHIBITOR RESISTANCE BY SEQUENCING: HIV-1 Integrase Inhibitor Resistance by Sequencing: UNDETERMINED

## 2018-03-05 NOTE — Progress Notes (Signed)
Behavioral Health Progress Note     Length of Session: 60 minutes    Contact Type:  Location: Off Site    Collateral Contact       Session Content::  Wtr texted pt on this date to remind him that he has Mental Health Court on this date. Pt did not respond. Wtr presented pt's case at Mental Health Court treatment meeting and then clinically represented pt's case in court-- without pt having appeared in court. Campbell RichesJudge Elliott issued bench warrant for Maylene Roesalph Torti.     Interventions:  Collateral contact with:  Campbell RichesJudge Elliott and Rodena MedinKimberly VanCamp (Mental Health Court Coordinator)      Plan:  Other planned interventions or recommendations: ACT Team has recommended to take pt into custody until a bed becomes available at CD inpatient facility. Pt identified CD inpatient as appropriate level of care for his CD at this time.     Pt no-showed court on this date 03/05/2018 and a bench warrant was placed for pt's arrest. Once pt is taken into custody, he will be held in custody until pt is accepted into CD inpatient facility and mandated to go bed-to-bed from CD inpatient facility to halfway house to residential program/treatment apartment program (or whichever level of care is found appropriate at that time).    NEXT APPT: as per ACT calendar      Alinda MoneyShannon Avonelle Viveros, LMSW

## 2018-03-10 ENCOUNTER — Other Ambulatory Visit: Payer: Self-pay | Admitting: Psychiatry

## 2018-03-12 ENCOUNTER — Ambulatory Visit: Payer: Medicaid Other

## 2018-03-17 ENCOUNTER — Telehealth: Payer: Self-pay

## 2018-03-17 NOTE — Telephone Encounter (Signed)
[facility] ACT Team Clinical Management Note     Date: 03/16/2018      Wtr spoke with pt's mother Gavin PoundDeborah to discuss active bench warrant for pt's arrest due to non-compliance with probation, CD treatment, and missing last Mental Health Court appearance. [name] stated that she had spoken with pt's s/o [name] who stated that they had been evicted from apartment [address] and were staying at from house to house of friends until they find an apartment. Wtr asked Gavin PoundDeborah to relay wtr's intention to assist pt whether he'd like to turn himself in for standing warrant or attempt detox and CD inpatient and then turn himself into custody. [name] stated that pt has been drinking and stated, "I know he's sick". Wtr provided support and discussed the difficulty of struggling with CD and reviewed treatment plan once more. Wtr and pt's mother said goodbyes and hung up.    Alinda MoneyShannon Jaydien Panepinto, LMSW

## 2018-03-20 NOTE — Progress Notes (Signed)
[facility] ACT Team Clinical Management Note     Date of visit: 03/12/2018    Wtr discussed pt at Mental Health Court case review and coordinated with Rodena MedinKimberly VanCamp and Campbell RichesJudge Elliott. There is still a standing bench warrant for pt's arrest due to non-compliance with treatment providers, probation, and Mental health court.     Derek Walker, LMSW

## 2018-03-26 ENCOUNTER — Ambulatory Visit: Payer: Medicaid Other | Admitting: Infectious Diseases

## 2018-04-08 ENCOUNTER — Other Ambulatory Visit: Payer: Self-pay | Admitting: Psychiatry

## 2018-04-08 ENCOUNTER — Ambulatory Visit: Payer: Medicaid Other | Attending: Psychiatry

## 2018-04-08 ENCOUNTER — Encounter: Payer: Self-pay | Admitting: Gastroenterology

## 2018-04-14 NOTE — Progress Notes (Signed)
Behavioral Health Progress Note     Length of Session: 15 minutes    Contact Type:  Location: Off Site    Collateral Contact with pt's mother Neoma Laming, face-to-face, with ACT Team Malva Limes, [place], LMSW        Session Content: Wtr and ACT Team Malva Limes, [place], LMSW met with pt's mother Neoma Laming at [address] [name] stated, "this is the longest he's ever gone without seeing me. I'd like to see him in person, make sure he's alright". Wtr reviewed that ACT Team is willing to meet pt where he is at and coordinate pt's detox or CD inpatient before pt turning himself in for standing bench warrant. [name] stated that she had relayed this message to pt over the phone since last time wtr and pt's mother had spoken but would "try again". Wtr thanked pt's mother and offered support. [name] stated, "it's not easy" and expressed she had been "dealing with this for a long time, since his early twenties, teens even". Wtr and Ben offered support to pt's mother. [name] was appreciative. Wtr, Ben, and pt's mother said goodbyes and wtr and Ben left.      Interventions:  Collateral contact with:  pt's mother Neoma Laming      Plan:  Other planned interventions or recommendations: Wtr will continue checking in with pt's mother and pt's probation officer as pt no longer has telephone and has no known residence    NEXT APPT: as per ACT calendar      Danise Edge, LMSW

## 2018-04-17 ENCOUNTER — Ambulatory Visit: Payer: Medicaid Other

## 2018-04-19 NOTE — Progress Notes (Signed)
[facility] Clinical Management Note     Date: 04/17/2018  Type of visit: Community visit    Wtr and ACT Team Evlyn KannerBen Currie, [place], LMSW arrived at pt's mother's residence at [address] and called pt's mother, Gavin PoundDeborah, to see if pt was home or if any additional information was available per pt's whereabouts, well-being. [name] stated that she had spoken with a man approximately 1-2 days ago over the phone who stated that she was calling to let her know that [name] was "okay". [name] stated that she believed that [name] himself had not contacted his mother due to not trusting her to not give pertinent information to police or pt's treatment teams, especially as to his whereabouts. [name] stated that she was upset that she had not seen [name] in over a month and stated that it was the longest that she had ever gone without seeing pt face-to-face. Wtr offered support. Wtr stated that wtr and Evlyn KannerBen Currie were standing outside and pt's mother did not offer to come outside to talk and per pt's mother being upset wtr did not directly ask her to. Wtr thanked pt's mother for her time and pt's mother stated that she would notify wtr if she received any more information. Wtr and Ben then left.    Alinda MoneyShannon Carmelo Reidel, LMSW

## 2018-04-22 ENCOUNTER — Ambulatory Visit: Payer: Medicaid Other | Attending: Psychiatry

## 2018-04-22 DIAGNOSIS — F323 Major depressive disorder, single episode, severe with psychotic features: Secondary | ICD-10-CM | POA: Insufficient documentation

## 2018-04-22 DIAGNOSIS — F333 Major depressive disorder, recurrent, severe with psychotic symptoms: Secondary | ICD-10-CM | POA: Insufficient documentation

## 2018-04-22 DIAGNOSIS — Z789 Other specified health status: Secondary | ICD-10-CM | POA: Insufficient documentation

## 2018-04-22 DIAGNOSIS — F4310 Post-traumatic stress disorder, unspecified: Secondary | ICD-10-CM | POA: Insufficient documentation

## 2018-04-26 NOTE — Progress Notes (Signed)
Behavioral Health Progress Note     Length of Session: 15 minutes    Contact Type:  Location: Off Site    Collateral Contact with pt's mother, Derek Walker      Session Content::  Wtr arrived to pt's mother's residence and knocked on the side door. [name] Derek Walker answered the door shortly thereafter, and stated that she had not seen Derek Walker "for a long time". Wtr informed pt's mother that he would be discharged from ACT on 05/30/2018 if unable to contact pt face-to-face beforehand. [name] agreed to relay message to Derek Walker and stated that this was the longest she had gone without seeing pt face-face. Wtr offered support to pt's mother. [name] received well. Wtr and pt's mother said goodbyes and wtr left.       Interventions:  Collateral contact with:  with pt's mother, Derek Walker      Plan:  Other planned interventions or recommendations: ACT Team will continue to attempt home visits at only address known for pt at this time, pt's mother's residence at [address]   ACT Team will continue to check-in with pt's probation officer, Derek Walker  ACT Team has continued to attempt home visits with pt, making collateral contacts with pt's mother as well as P.O., ACT Team has not had a face-to-face contact with pt since 02/27/2018 despite attempts. Wtr left a hand-written message with pt's mother that if pt was not seen face-to-face with ACT Team by 05/30/2018, [facility]    NEXT APPT: as per ACT calendar      [name], LMSW

## 2018-04-27 ENCOUNTER — Ambulatory Visit: Payer: Medicaid Other | Admitting: Psychiatry

## 2018-04-27 NOTE — Progress Notes (Signed)
ACT community visit was attempted.  Patient's uncle was at the residence and reported Derek Walker had not been seen for at least 3 weeks.  If he sees Derek Walker he will notify him that ACT team had stopped by.     Alcus DadShane Pharrell Ledford M.A. D.O.  1:57 PM 04/27/18  Psychiatry PGY-3  Pager [phone]

## 2018-05-01 ENCOUNTER — Ambulatory Visit: Payer: Medicaid Other

## 2018-05-05 NOTE — Progress Notes (Signed)
[facility] MISSED/CANCELLED APPOINTMENT     Name: Sunnie NielsenRALPH D Scoles  MRN: [id number]   DOB: 1975-09-27    Date of Scheduled Service: 05/01/2018    Mr. Crosley was a no show for today's appointment.  I have left the patient a message to contact me.    Additional Information:    Wtr stopped by pt's mother's residence on [address] with no answer at [facility]ident[facility]'s residence. Wtr left card with note for pt to contact wtr if pt's mother came into contact with him.      Alinda MoneyShannon Rafi Kenneth, LMSW

## 2018-05-07 ENCOUNTER — Ambulatory Visit: Payer: Medicaid Other

## 2018-05-14 ENCOUNTER — Encounter: Payer: Self-pay | Admitting: Gastroenterology

## 2018-05-19 NOTE — Progress Notes (Signed)
Behavioral Health Progress Note     Length of Session: 15 minutes    Contact Type:  Location: Off Site    Collateral Contact       Session Content::  Wtr arrived to pt's mother's residence. Wtr knocked on side door and pt's Uncle Horris opened the door and [name] stated from inside, "Is that Carollee Herter here to see me? Let her in." Uncle Horris opened the door and wtr stood on the side porch speaking to [name] through the open internal doorway. [name] was standing behind a desk and seemed to be handling business with another gentlemen that was standing in front of the desk. [name] stated that she had no new information for wtr and did not know of pt's whereabouts and still had not seen pt face-to-face. Wtr left card for [name] as well as to pass along contact information to pt. [name] stated appreciation for ACT Team's continued attempts to support Benji. Wtr and [name] said goodbyes and wtr left.     Interventions:  Collateral contact with:  pt's mother, [name] and pt's Uncle Horris      Plan:  Other planned interventions or recommendations: Wtr will attempt collateral contact with pt again due to not having current address for pt. Pt will be pending discharge from ACT services in September 2019 if he does not have a face to face contact with ACT Team soon. Wtr has left several messages informing pt of this with pt's mother.     NEXT APPT: as per ACT calendar      [name], LMSW

## 2018-06-01 ENCOUNTER — Other Ambulatory Visit: Payer: Self-pay | Admitting: Psychiatry

## 2018-06-01 DIAGNOSIS — F323 Major depressive disorder, single episode, severe with psychotic features: Secondary | ICD-10-CM

## 2018-06-01 NOTE — BH Crisis Intervention (Signed)
[facility] Behavior[facility] Safety Plan   Wtr has derived safety plan assessment from therapeutic rapport with Derek Walker though assessment was not completed face-to-face as pt has evaded meeting with ACT Team since 02/27/2018 despite ACT Team's continued home visit attempts and continued collateral contacts with pt's mother, pt's maternal Uncle, pt's probation officer, and pt's Education administratorJudge.      Step 1: Warning signs (thoughts, images, feelings, behaviors) that a crisis may be developing:  isolating/avoiding behavior, not taking care of self, racing heart, sweating/racing heartbeat/breathing hard and increase in use of alcohol    Step 2: Internal coping strategies - Things I can do to take my mind off my problems without contacting another person (distracting & calming activities):  Pt had historically identified that he could attend CD or therapy sessions with treatment providers as a way to process stressors and identify coping and problem-solving skills to overcomes stressors/barriers to CD and mental health recovery.   [facility] to have [facility] from relationship stressors    Step 3: People and social settings that provide distraction:   Name: Pt's maternal Uncle, Derek Walker  Phone: unknown   Name: [name], Derek Walker  Phone: [phone] (cellphone number)   Place: Administrator, [facility]     Step 4: People whom I can ask for help:  Family member: [name], Derek Walker ; phone: [phone] (cellphone number)  Therapist/Counselor: Derek Walker, LMSW    Step 5: Professionals or agencies I can contact during a crisis:  1.  Clinician Name: Derek MoneyShannon Atoya Andrew, LMSW  Phone: [phone]   Clinician Emergency Contact #: [phone]  2.  Clinician Name: Derek Walker, CASAC, LMSW  Phone: [phone]   Clinician Emergency Contact #: [phone]  3.  Local Urgent Care or Emergency Room  4.  SUICIDE PREVENTION [place]IFELINE PHONE: [phone]  5.  LIFELINE OR MOBILE CRISIS:  [phone] or [phone]  6.  POLICE: 911    Step 6: Making the environment safe (removing or limiting access to lethal means):  staying with family/friend and removing alcohol from home    The one thing that is most important to me and worth living for is:  Pt had stated that being a father to his youngest daughter Derek Walker (AKA Derek Walker) filled him with hopefulness for the future.    Patient Signature: ___________unable to obtain pt's signature___________________________________________    Clinician Signature: ____[name] Vincent Walker, LMSW_________________________________________________
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
## 2018-09-02 ENCOUNTER — Telehealth: Payer: Self-pay | Admitting: Infectious Diseases

## 2018-09-02 NOTE — Telephone Encounter (Signed)
Copied from CRM [id number]. Topic: Access to Care - Speak to Provider/Office Staff  >> Sep 02, 2018  9:24 AM Dutch QuintEckerson, Glennette Galster wrote:  [facility] is calling to speak with a provider or nurse regarding the patient's care.    Misty StanleyLisa can be reached at [phone]

## 2018-11-27 ENCOUNTER — Other Ambulatory Visit: Payer: Self-pay | Admitting: Infectious Diseases

## 2018-11-27 ENCOUNTER — Ambulatory Visit: Payer: Medicaid Other | Admitting: Infectious Diseases

## 2018-11-27 DIAGNOSIS — Z79899 Other long term (current) drug therapy: Secondary | ICD-10-CM

## 2018-11-27 DIAGNOSIS — B20 Human immunodeficiency virus [HIV] disease: Secondary | ICD-10-CM

## 2019-01-14 ENCOUNTER — Other Ambulatory Visit: Payer: Self-pay | Admitting: Cardiology

## 2019-01-14 ENCOUNTER — Emergency Department
Admission: EM | Admit: 2019-01-14 | Discharge: 2019-01-14 | Disposition: A | Discharge status: Home / Self Care | Payer: Medicaid Other | Source: Ambulatory Visit | Attending: Emergency Medicine | Admitting: Emergency Medicine

## 2019-01-14 DIAGNOSIS — I4891 Unspecified atrial fibrillation: Secondary | ICD-10-CM

## 2019-01-14 DIAGNOSIS — R410 Disorientation, unspecified: Secondary | ICD-10-CM

## 2019-01-14 DIAGNOSIS — F17210 Nicotine dependence, cigarettes, uncomplicated: Secondary | ICD-10-CM | POA: Insufficient documentation

## 2019-01-14 DIAGNOSIS — R569 Unspecified convulsions: Secondary | ICD-10-CM

## 2019-01-14 LAB — CBC AND DIFFERENTIAL
Baso # K/uL: 0 10*3/uL (ref 0.0–0.1)
Basophil %: 0.6 %
Eos # K/uL: 0.1 10*3/uL (ref 0.0–0.5)
Eosinophil %: 3 %
Hematocrit: 40 % (ref 40–51)
Hemoglobin: 13.8 g/dL (ref 13.7–17.5)
IMM Granulocytes #: 0 10*3/uL (ref 0.0–0.0)
IMM Granulocytes: 0.3 %
Lymph # K/uL: 0.8 10*3/uL — ABNORMAL LOW (ref 1.3–3.6)
Lymphocyte %: 24.1 %
MCH: 33 pg/cell — ABNORMAL HIGH (ref 26–32)
MCHC: 35 g/dL (ref 32–37)
MCV: 95 fL — ABNORMAL HIGH (ref 79–92)
Mono # K/uL: 0.6 10*3/uL (ref 0.3–0.8)
Monocyte %: 17.1 %
Neut # K/uL: 1.8 10*3/uL (ref 1.8–5.4)
Nucl RBC # K/uL: 0 10*3/uL (ref 0.0–0.0)
Nucl RBC %: 0.3 /100 WBC — ABNORMAL HIGH (ref 0.0–0.2)
Platelets: 88 10*3/uL — ABNORMAL LOW (ref 150–330)
RBC: 4.2 MIL/uL — ABNORMAL LOW (ref 4.6–6.1)
RDW: 12.6 % (ref 11.6–14.4)
Seg Neut %: 54.9 %
WBC: 3.3 10*3/uL — ABNORMAL LOW (ref 4.2–9.1)

## 2019-01-14 LAB — HOLD BLUE

## 2019-01-14 LAB — BASIC METABOLIC PANEL
Anion Gap: 15 (ref 7–16)
CO2: 24 mmol/L (ref 20–28)
Calcium: 9.3 mg/dL (ref 9.0–10.3)
Chloride: 96 mmol/L (ref 96–108)
Creatinine: 0.83 mg/dL (ref 0.67–1.17)
GFR,Black: 124 *
GFR,Caucasian: 108 *
Glucose: 90 mg/dL (ref 60–99)
Lab: 7 mg/dL (ref 6–20)
Sodium: 135 mmol/L (ref 133–145)

## 2019-01-14 LAB — HOLD SST

## 2019-01-14 LAB — DRUG SCREEN CHEMICAL DEPENDENCY, URINE
Amphetamine,UR: NEGATIVE
Benzodiazepinen,UR: NEGATIVE
Cocaine/Metab,UR: NEGATIVE
Opiates,UR: NEGATIVE
THC Metabolite,UR: POSITIVE

## 2019-01-14 LAB — HOLD LAVENDER

## 2019-01-14 LAB — ETHANOL: Ethanol: <10 mg/dL (ref 0–9)

## 2019-01-14 LAB — POTASSIUM,PLASMA

## 2019-01-14 LAB — HOLD GREEN WITH GEL

## 2019-01-14 LAB — SALICYLATE LEVEL: Salicylate: <3 mg/dL — ABNORMAL LOW (ref 15.0–30.0)

## 2019-01-14 MED ORDER — SODIUM CHLORIDE 0.9 % FLUSH FOR PUMPS *I*
0.0000 mL/h | INTRAVENOUS | Status: DC | PRN
Start: 2019-01-14 — End: 2019-01-14

## 2019-01-14 MED ORDER — DEXTROSE 5 % FLUSH FOR PUMPS *I*
0.0000 mL/h | INTRAVENOUS | Status: DC | PRN
Start: 2019-01-14 — End: 2019-01-14
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
## 2019-01-14 NOTE — ED Notes (Signed)
Patient verbalized understanding of discharge instructions provided at this time. Patient has safe discharge/transportation to residence with access. Appropriate clothing in place. Patient denies further needs and concerns at this time. IV removed per facility protocol, tolerated well. Patient ambulated without difficulty to discharge area.
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
## 2019-01-14 NOTE — Discharge Instructions (Addendum)
You were seen and evaluated in the emergency department today for concern that you had a seizure. You had lab work which showed no evidence of dehydration, electrolyte abnormality, or use of any substance that could cause a seizure.    Please continue to take all home medications as previously prescribed.    We are unsure if you had a seizure at this time    Please call your primary care doctor upon discharge and notify them that you were seen in the emergency department. Please follow-up with them in 3-5 days for re-evaluation.    The phone number for our neurology department - seizure center has been provided for you. Please call this number and schedule follow up with this service as soon as you are able to do so following discharge from the hospital.     Please follow up with your infectious diseases provider as soon as you are able to schedule an appointment    Please refrain from driving for at least 6 months after experiencing a seizure or until neurology clears you for driving.     Please stay well hydrated and maintain a healthy diet once discharged from the hospital.     Return to the emergency department if you experience any further episodes concerning for seizure, fevers, chills, chest pain, shortness of breath, dizziness, headache, lightheadedness, nausea, vomiting, vision changes, or for any additional concerns that you may have.
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
## 2019-01-14 NOTE — ED Notes (Signed)
Report Given To  Team 4, RN      Descriptive Sentence / Reason for Admission   Pt comes to [facility] after having a witnessed "first" seizure. Pts wife states he was shaking and foaming at the mouth.       Active Issues / Relevant Events   - AaOx4 / ambulatory  -       To Do List  - Meds per [facility]  - VS & assessments Q4  - Need urine        Anticipatory Guidance / Discharge Planning  Pending work up

## 2019-01-14 NOTE — ED Notes (Signed)
Assumed care of patient who is in bed with wheels locked and in lowest position. Call bell within reach. Will continue to closely monitor patient.

## 2019-01-14 NOTE — ED Notes (Signed)
Pt presents to [facility] after a witnessed seizure by his wife. Pt has no history of seizures. Per EMS, pt was sitting in chair when his wife noticed he was "foaming at the mouth and shaking." EMS states that upon arrival the pt was no longer seizing but was diaphoretic and postictal. Pt is currently AaOx4, he is ambulatory. Pt has no memories of the seizure, states the first thing he remembers was the ambulance crew entering the house. Pt was placed on the monitor, seizure precautions in place. Will continue to monitor.

## 2019-01-15 ENCOUNTER — Encounter: Payer: Self-pay | Admitting: Emergency Medicine

## 2019-01-15 ENCOUNTER — Telehealth: Payer: Self-pay | Admitting: Psychiatry

## 2019-01-15 LAB — EKG 12-LEAD
P: 45 deg
PR: 154 ms
QRS: 56 deg
QRSD: 85 ms
QT: 359 ms
QTc: 435 ms
Rate: 88 {beats}/min
T: 66 deg

## 2019-01-15 LAB — QUANT THC, URINE
Quant THC, Urine: 850 ng/mL
THC/Creat Ratio: 274 ng/mg

## 2019-01-15 LAB — THC SEMI-QUANT, URINE: Creatinine,UR: 310 mg/dL — ABNORMAL HIGH (ref 20–300)

## 2019-01-15 NOTE — Telephone Encounter (Signed)
01/14/19-ED Transition; Brief chart review. Arrived to ED with c/o seizure.  Pt was evaluated and discharged.    In-basket routed to scheduling; Please call pt and offer to schedule FUV for 3-5 days after 01/14/19 ED visit.  Burnice Logan, RN   Medicine in Psychiatry   [phone]  [place], [place] [address]  [phone]

## 2019-01-16 LAB — THC METABOLITE,QNT,UR: THC METABOLITE,QNT,UR: >500 ng/mL

## 2019-01-20 ENCOUNTER — Ambulatory Visit: Payer: Medicaid Other | Admitting: Psychiatry

## 2019-01-20 DIAGNOSIS — F32A Depression, unspecified: Secondary | ICD-10-CM

## 2019-01-20 DIAGNOSIS — B20 Human immunodeficiency virus [HIV] disease: Secondary | ICD-10-CM

## 2019-01-20 DIAGNOSIS — F329 Major depressive disorder, single episode, unspecified: Secondary | ICD-10-CM

## 2019-01-20 DIAGNOSIS — F419 Anxiety disorder, unspecified: Secondary | ICD-10-CM

## 2019-01-20 DIAGNOSIS — K219 Gastro-esophageal reflux disease without esophagitis: Secondary | ICD-10-CM

## 2019-01-20 DIAGNOSIS — F323 Major depressive disorder, single episode, severe with psychotic features: Secondary | ICD-10-CM

## 2019-01-20 DIAGNOSIS — F4310 Post-traumatic stress disorder, unspecified: Secondary | ICD-10-CM

## 2019-01-20 MED ORDER — GABAPENTIN 100 MG PO CAPSULE *I*
200.0000 mg | ORAL_CAPSULE | Freq: Three times a day (TID) | ORAL | 2 refills | Status: DC
Start: 2019-01-20 — End: 2019-11-18

## 2019-01-20 MED ORDER — NALTREXONE HCL 50 MG PO TABS *I*
50.0000 mg | ORAL_TABLET | Freq: Every day | ORAL | 5 refills | Status: DC
Start: 2019-01-20 — End: 2019-11-23

## 2019-01-20 MED ORDER — SULFAMETHOXAZOLE-TRIMETHOPRIM 800-160 MG PO TABS *I*
1.0000 | ORAL_TABLET | Freq: Every day | ORAL | 0 refills | Status: DC
Start: 2019-01-20 — End: 2019-11-23

## 2019-01-20 MED ORDER — GABAPENTIN 100 MG PO CAPSULE *I*
100.0000 mg | ORAL_CAPSULE | Freq: Three times a day (TID) | ORAL | 2 refills | Status: DC
Start: 2019-01-20 — End: 2019-11-23

## 2019-01-20 MED ORDER — MIRTAZAPINE 15 MG PO TABS *I*
15.0000 mg | ORAL_TABLET | Freq: Every evening | ORAL | 5 refills | Status: DC
Start: 2019-01-20 — End: 2019-11-23

## 2019-01-20 MED ORDER — OLANZAPINE 10 MG PO TABS *I*
10.0000 mg | ORAL_TABLET | Freq: Every evening | ORAL | 2 refills | Status: DC
Start: 2019-01-20 — End: 2019-11-23

## 2019-01-20 MED ORDER — OLANZAPINE 5 MG PO TABS *I*
5.0000 mg | ORAL_TABLET | Freq: Every day | ORAL | 5 refills | Status: DC
Start: 2019-01-20 — End: 2019-11-23

## 2019-01-20 MED ORDER — MIRTAZAPINE 7.5 MG PO TABS *A*
7.5000 mg | ORAL_TABLET | Freq: Every evening | ORAL | 1 refills | Status: DC
Start: 2019-01-20 — End: 2019-11-23

## 2019-01-20 MED ORDER — AMMONIUM LACTATE 12 % EX LOTN *I*
TOPICAL_LOTION | Freq: Two times a day (BID) | CUTANEOUS | 5 refills | Status: DC | PRN
Start: 2019-01-20 — End: 2019-11-23

## 2019-01-20 MED ORDER — OMEPRAZOLE 40 MG PO CPDR *I*
40.0000 mg | DELAYED_RELEASE_CAPSULE | Freq: Every day | ORAL | 5 refills | Status: DC
Start: 2019-01-20 — End: 2019-11-23

## 2019-01-20 MED ORDER — EFAVIRENZ-EMTRICITAB-TENOFOVIR 600-200-300 MG PO TABS *I*
1.0000 | ORAL_TABLET | Freq: Every evening | ORAL | 3 refills | Status: DC
Start: 2019-01-20 — End: 2019-11-23

## 2019-01-20 MED ORDER — NICOTINE POLACRILEX 4 MG MT GUM *I*
4.0000 mg | CHEWING_GUM | OROMUCOSAL | 5 refills | Status: DC | PRN
Start: 2019-01-20 — End: 2019-11-23

## 2019-01-20 NOTE — Progress Notes (Signed)
Telephone Visit     This is an established patient visit.    Reason for visit:    IDH:WYSHUO Jonis today to check in with him. He is living with his mother. He missed his last psych apt and tells me he is out of his medications. He also missed his apt with his HIV doctor and is out of his medications.    Review of Systems   Constitutional: Negative for diaphoresis, fever and weight loss.   Respiratory: Negative for cough, shortness of breath and wheezing.    Cardiovascular: Negative for chest pain and palpitations.   Gastrointestinal: Negative for abdominal pain, constipation and heartburn.   Genitourinary: Negative for dysuria.   Musculoskeletal: Negative for back pain and neck pain.   Neurological: Negative for dizziness, tingling and headaches.   Psychiatric/Behavioral: Negative for depression and substance abuse.         Patient's problem list, allergies, and medications were reviewed and updated as appropriate. Please see the EHR for full details.    Physical Exam:    This visit was performed during a pandemic event, thus the physical exam was not performed.    Assessment Plan:  43 year old male with HIV and schizophrenia, called today to check in on him.  Current Outpatient Medications   Medication Sig    omeprazole (PRILOSEC) 40 MG capsule Take 1 capsule (40 mg total) by mouth daily    ammonium lactate (LAC-HYDRIN) 12 % lotion Apply topically 2 times daily as needed for Dry Skin    naltrexone (DEPADE) 50 MG tablet Take 1 tablet (50 mg total) by mouth daily    OLANZapine (ZYPREXA) 5 MG tablet Take 1 tablet (5 mg total) by mouth daily    OLANZapine (ZYPREXA) 10 MG tablet Take 1 tablet (10 mg total) by mouth nightly TDD 15mg     efavirenz-emtrictabine-tenofovir (ATRIPLA) 600-200-300 MG per tablet Take 1 tablet by mouth nightly on an empty stomach    sulfamethoxazole-trimethoprim (BACTRIM DS,SEPTRA DS) 800-160 MG per tablet Take 1 tablet by mouth daily    gabapentin (GABAPENTIN) 100MG  capsule Take 1  capsule (100 mg total) by mouth 3 times daily    gabapentin (GABAPENTIN) 100MG  capsule Take 2 capsules (200 mg total) by mouth 3 times daily For anxiety    mirtazapine (REMERON) 7.5 MG tablet Take 1 tablet (7.5 mg total) by mouth nightly    mirtazapine (REMERON) 15 MG tablet Take 1 tablet (15 mg total) by mouth nightly    nicotine polacrilex (NICORETTE) 4 MG gum Take 1 each (4 mg total) by mouth as needed for Smoking cessation Max daily dose: 24 mg     The plan was discussed with the patient and the patient/patient rep demonstrated understanding to the provider's satisfaction.    Refilled all of his medications, he will call his HIV doctor and make an apt with psych as well.    Consent was previously obtained from the patient to complete this telephone consult; including the potential for financial liability.    15 minutes were spent on the phone with the patient, patient representatives, and/or other attendees.       Juluis Pitch, NP

## 2019-10-11 ENCOUNTER — Other Ambulatory Visit: Payer: Self-pay | Admitting: Infectious Diseases

## 2019-10-11 NOTE — Telephone Encounter (Signed)
Derek Walker calling to request prescription(s) Derek Walker to be sent to [facility],.    Is patient out of the medication? yes  Does the patient have questions regarding the medication for the nurse? no    Patient's last monitor was   Lab Results   Component Value Date    HIV 4,182 02/20/2018     Patient's upcoming appointment is   Future Appointments       Provider Department Center    10/13/2019 11:30 AM Jenna Luo, NP [facility]           Patient can be reached if necessary at [phone] (home).

## 2019-10-11 NOTE — Telephone Encounter (Signed)
Will discuss medications with him at his appt Wednesday. We need labs- it has been almost 2 years since he has had labs and it appears he was changed back to Atripla by [facility] due to insurance issue- but has probably had lapse in treatment since RX was done >7 months ago for a 4 month supply

## 2019-10-13 ENCOUNTER — Ambulatory Visit: Payer: Medicaid Other | Admitting: Infectious Diseases

## 2019-11-17 ENCOUNTER — Emergency Department: Payer: Medicaid Other

## 2019-11-17 ENCOUNTER — Inpatient Hospital Stay
Admission: EM | Admit: 2019-11-17 | Discharge: 2019-11-23 | DRG: 890 | Disposition: A | Discharge status: Home / Self Care | Payer: Medicaid Other | Source: Ambulatory Visit | Attending: Infectious Disease | Admitting: Infectious Disease

## 2019-11-17 ENCOUNTER — Other Ambulatory Visit: Payer: Self-pay | Admitting: Cardiology

## 2019-11-17 ENCOUNTER — Encounter: Payer: Self-pay | Admitting: Emergency Medicine

## 2019-11-17 DIAGNOSIS — F419 Anxiety disorder, unspecified: Secondary | ICD-10-CM | POA: Diagnosis present

## 2019-11-17 DIAGNOSIS — Z9114 Patient's other noncompliance with medication regimen: Secondary | ICD-10-CM

## 2019-11-17 DIAGNOSIS — K219 Gastro-esophageal reflux disease without esophagitis: Secondary | ICD-10-CM | POA: Diagnosis present

## 2019-11-17 DIAGNOSIS — R000 Tachycardia, unspecified: Secondary | ICD-10-CM

## 2019-11-17 DIAGNOSIS — K209 Esophagitis, unspecified without bleeding: Secondary | ICD-10-CM | POA: Diagnosis present

## 2019-11-17 DIAGNOSIS — R509 Fever, unspecified: Secondary | ICD-10-CM

## 2019-11-17 DIAGNOSIS — R0602 Shortness of breath: Secondary | ICD-10-CM | POA: Diagnosis present

## 2019-11-17 DIAGNOSIS — F209 Schizophrenia, unspecified: Secondary | ICD-10-CM | POA: Diagnosis present

## 2019-11-17 DIAGNOSIS — R9431 Abnormal electrocardiogram [ECG] [EKG]: Secondary | ICD-10-CM | POA: Diagnosis not present

## 2019-11-17 DIAGNOSIS — Z6821 Body mass index (BMI) 21.0-21.9, adult: Secondary | ICD-10-CM

## 2019-11-17 DIAGNOSIS — Z20822 Contact with and (suspected) exposure to covid-19: Secondary | ICD-10-CM | POA: Diagnosis present

## 2019-11-17 DIAGNOSIS — R0789 Other chest pain: Secondary | ICD-10-CM

## 2019-11-17 DIAGNOSIS — J181 Lobar pneumonia, unspecified organism: Secondary | ICD-10-CM | POA: Diagnosis present

## 2019-11-17 DIAGNOSIS — F17210 Nicotine dependence, cigarettes, uncomplicated: Secondary | ICD-10-CM | POA: Diagnosis present

## 2019-11-17 DIAGNOSIS — A4189 Other specified sepsis: Principal | ICD-10-CM | POA: Diagnosis present

## 2019-11-17 DIAGNOSIS — R634 Abnormal weight loss: Secondary | ICD-10-CM | POA: Diagnosis present

## 2019-11-17 DIAGNOSIS — B3789 Other sites of candidiasis: Secondary | ICD-10-CM | POA: Diagnosis present

## 2019-11-17 DIAGNOSIS — B20 Human immunodeficiency virus [HIV] disease: Secondary | ICD-10-CM

## 2019-11-17 DIAGNOSIS — J189 Pneumonia, unspecified organism: Secondary | ICD-10-CM

## 2019-11-17 DIAGNOSIS — A430 Pulmonary nocardiosis: Secondary | ICD-10-CM | POA: Diagnosis present

## 2019-11-17 DIAGNOSIS — R918 Other nonspecific abnormal finding of lung field: Secondary | ICD-10-CM

## 2019-11-17 DIAGNOSIS — J45909 Unspecified asthma, uncomplicated: Secondary | ICD-10-CM | POA: Diagnosis present

## 2019-11-17 DIAGNOSIS — E876 Hypokalemia: Secondary | ICD-10-CM | POA: Diagnosis not present

## 2019-11-17 DIAGNOSIS — J984 Other disorders of lung: Secondary | ICD-10-CM

## 2019-11-17 DIAGNOSIS — F1010 Alcohol abuse, uncomplicated: Secondary | ICD-10-CM | POA: Diagnosis present

## 2019-11-17 DIAGNOSIS — R519 Headache, unspecified: Secondary | ICD-10-CM

## 2019-11-17 DIAGNOSIS — B0052 Herpesviral keratitis: Secondary | ICD-10-CM | POA: Diagnosis present

## 2019-11-17 LAB — TROPONIN T 0 HR HIGH SENSITIVITY (IP/ED ONLY): TROP T 0 HR High Sensitivity: <6 ng/L (ref 0–21)

## 2019-11-17 LAB — URINALYSIS WITH MICROSCOPIC
Bacteria,UA: NONE SEEN
Blood,UA: NEGATIVE
Glucose,UA: NEGATIVE mg/dL
Ketones, UA: NEGATIVE
Leuk Esterase,UA: NEGATIVE
Nitrite,UA: NEGATIVE
RBC,UA: NONE SEEN /hpf
Specific Gravity,UA: >=1.045 — AB (ref 1.002–1.030)
WBC,UA: NONE SEEN /hpf (ref 0–5)
pH,UA: 7.5 (ref 5.0–8.0)

## 2019-11-17 LAB — CBC AND DIFFERENTIAL
Baso # K/uL: 0 10*3/uL (ref 0.0–0.1)
Basophil %: 0 %
Eos # K/uL: 0 10*3/uL (ref 0.0–0.5)
Eosinophil %: 0 %
Hematocrit: 39 % — ABNORMAL LOW (ref 40–51)
Hemoglobin: 13.2 g/dL — ABNORMAL LOW (ref 13.7–17.5)
Lymph # K/uL: 0.6 10*3/uL — ABNORMAL LOW (ref 1.3–3.6)
Lymphocyte %: 4.4 %
MCH: 32 pg/cell (ref 26–32)
MCHC: 34 g/dL (ref 32–37)
MCV: 95 fL — ABNORMAL HIGH (ref 79–92)
Mono # K/uL: 0.6 10*3/uL (ref 0.3–0.8)
Monocyte %: 4.4 %
Neut # K/uL: 12.7 10*3/uL — ABNORMAL HIGH (ref 1.8–5.4)
Nucl RBC # K/uL: 0 10*3/uL (ref 0.0–0.0)
Nucl RBC %: 0 /100 WBC (ref 0.0–0.2)
Platelets: 181 10*3/uL (ref 150–330)
RBC: 4.1 MIL/uL — ABNORMAL LOW (ref 4.6–6.1)
RDW: 11.2 % — ABNORMAL LOW (ref 11.6–14.4)
Seg Neut %: 91.2 %
WBC: 14 10*3/uL — ABNORMAL HIGH (ref 4.2–9.1)

## 2019-11-17 LAB — GRAM STAIN: Gram Stain: 0

## 2019-11-17 LAB — AEROBIC CULTURE

## 2019-11-17 LAB — HCT AND HGB
Hematocrit: 36 % — ABNORMAL LOW (ref 40–51)
Hemoglobin: 12.3 g/dL — ABNORMAL LOW (ref 13.7–17.5)

## 2019-11-17 LAB — EKG 12-LEAD
P: 70 deg
PR: 124 ms
QRS: 78 deg
QRSD: 78 ms
QT: 394 ms
QTc: 538 ms
Rate: 112 {beats}/min
T: 82 deg

## 2019-11-17 LAB — LACTATE, PLASMA: Lactate: 2.7 mmol/L — ABNORMAL HIGH (ref 0.5–2.2)

## 2019-11-17 LAB — TROPONIN T 3 HR W/ DELTA HIGH SENSITIVITY (IP/ED ONLY): TROP T 3 HR High Sensitivity: <6 ng/L (ref 0–21)

## 2019-11-17 LAB — LYMPHOCYTE SUBSET (T CELLS ONLY)
CD4#: 54 cells/uL — ABNORMAL LOW (ref 496–2186)
CD4%: 8 % — ABNORMAL LOW (ref 32–71)
CD4/CD8: 0.1 — ABNORMAL LOW (ref 0.7–3.0)
CD8#: 373 cells/uL (ref 177–1137)
T LYM #(CD3): 467 cells/uL — ABNORMAL LOW (ref 754–2810)
T LYM %(CD3): 69 % (ref 54–87)
T Suppress %(CD8): 55 % — ABNORMAL HIGH (ref 10–38)

## 2019-11-17 LAB — RUQ PANEL (ED ONLY)
ALT: 29 U/L (ref 0–50)
AST: 46 U/L (ref 0–50)
Albumin: 3.8 g/dL (ref 3.5–5.2)
Alk Phos: 65 U/L (ref 40–130)
Amylase: 155 U/L — ABNORMAL HIGH (ref 28–100)
Bilirubin,Direct: 0.3 mg/dL (ref 0.0–0.3)
Bilirubin,Total: 0.8 mg/dL (ref 0.0–1.2)
Lipase: 27 U/L (ref 13–60)
Total Protein: 7.7 g/dL (ref 6.3–7.7)

## 2019-11-17 LAB — DIFF MANUAL: Diff Based On: 113 CELLS

## 2019-11-17 LAB — PERFORMING LAB

## 2019-11-17 LAB — PLASMA PROF 7 (ED ONLY)
Anion Gap,PL: 16 (ref 7–16)
CO2,Plasma: 23 mmol/L (ref 20–28)
Chloride,Plasma: 93 mmol/L — ABNORMAL LOW (ref 96–108)
Creatinine: 0.7 mg/dL (ref 0.67–1.17)
GFR,Black: 133 *
GFR,Caucasian: 115 *
Glucose,Plasma: 130 mg/dL — ABNORMAL HIGH (ref 60–99)
Potassium,Plasma: 3.1 mmol/L — ABNORMAL LOW (ref 3.3–4.6)
Sodium,Plasma: 132 mmol/L — ABNORMAL LOW (ref 133–145)
UN,Plasma: <4 mg/dL — ABNORMAL LOW (ref 6–20)

## 2019-11-17 LAB — COVID-19 PCR

## 2019-11-17 LAB — RSV PCR: RSV PCR: 0

## 2019-11-17 LAB — MRSA (ORSA) AMPLIFICATION: MRSA (ORSA) Amplification: 0

## 2019-11-17 LAB — S. PNEUMONIAE ANTIGEN: S. pneumoniae Antigen: 0

## 2019-11-17 LAB — LEGIONELLA ANTIGEN, URINE: Legionella Antigen (Urine): 0

## 2019-11-17 LAB — LACTATE DEHYDROGENASE: LD: 195 U/L (ref 118–225)

## 2019-11-17 LAB — UNABLE TO PERFORM ADD-ON TESTING 1

## 2019-11-17 LAB — MAGNESIUM: Magnesium: 1.6 mg/dL (ref 1.6–2.5)

## 2019-11-17 LAB — HOLD BLUE

## 2019-11-17 LAB — COVID-19 NAAT (PCR): COVID-19 NAAT (PCR): NEGATIVE

## 2019-11-17 LAB — MCHC: MCHC: 34 g/dL (ref 32–37)

## 2019-11-17 LAB — INFLUENZA A: Influenza A PCR: 0

## 2019-11-17 LAB — INFLUENZA B PCR: Influenza B PCR: 0

## 2019-11-17 MED ORDER — VANCOMYCIN IV - PHARMACIST TO DOSE PLACEHOLDER *I*
Status: DC
Start: 2019-11-17 — End: 2019-11-19
  Filled 2019-11-17 (×2): qty 1

## 2019-11-17 MED ORDER — OXYCODONE HCL 5 MG PO TABS *I*
5.0000 mg | ORAL_TABLET | Freq: Once | ORAL | Status: AC
Start: 2019-11-17 — End: 2019-11-17
  Administered 2019-11-17: 5 mg via ORAL
  Filled 2019-11-17: qty 1

## 2019-11-17 MED ORDER — THIAMINE HCL 100 MG PO TABS *WRAPPED*
100.0000 mg | ORAL_TABLET | Freq: Every day | ORAL | Status: DC
Start: 2019-11-18 — End: 2019-11-23
  Administered 2019-11-18 – 2019-11-23 (×6): 100 mg via ORAL
  Filled 2019-11-17 (×6): qty 1

## 2019-11-17 MED ORDER — SODIUM CHLORIDE 0.9 % FLUSH FOR PUMPS *I*
0.0000 mL/h | INTRAVENOUS | Status: DC | PRN
Start: 2019-11-17 — End: 2019-11-23

## 2019-11-17 MED ORDER — PROSOURCE NO CARB PO LIQD *I*
30.0000 mL | Freq: Two times a day (BID) | ORAL | Status: DC
Start: 2019-11-17 — End: 2019-11-23
  Administered 2019-11-17 – 2019-11-23 (×12): 30 mL via ORAL

## 2019-11-17 MED ORDER — MAGNESIUM SULFATE 2 GM IN 50 ML *WRAPPED*
2000.0000 mg | Freq: Once | INTRAVENOUS | Status: AC
Start: 2019-11-17 — End: 2019-11-17
  Administered 2019-11-17: 2000 mg via INTRAVENOUS
  Filled 2019-11-17: qty 50

## 2019-11-17 MED ORDER — SULFAMETHOXAZOLE-TRIMETHOPRIM 400-80 MG/5ML IV SOLN *I*
5.0000 mg/kg | Freq: Three times a day (TID) | INTRAVENOUS | Status: DC
Start: 2019-11-17 — End: 2019-11-17
  Filled 2019-11-17 (×3): qty 21.97

## 2019-11-17 MED ORDER — VANCOMYCIN HCL IN NACL 1000 MG/275 ML IV SOLN *I*
1000.0000 mg | Freq: Three times a day (TID) | INTRAVENOUS | Status: DC
Start: 2019-11-17 — End: 2019-11-18
  Administered 2019-11-17 – 2019-11-18 (×4): 1000 mg via INTRAVENOUS
  Filled 2019-11-17 (×2): qty 1000
  Filled 2019-11-17 (×3): qty 275

## 2019-11-17 MED ORDER — LACTATED RINGERS IV SOLN *I*
100.0000 mL/h | INTRAVENOUS | Status: DC
Start: 2019-11-17 — End: 2019-11-19
  Administered 2019-11-17 (×2): 100 mL/h via INTRAVENOUS
  Administered 2019-11-17: 100 mL/h
  Administered 2019-11-17: 100 mL/h via INTRAVENOUS
  Administered 2019-11-17 (×2): 100 mL/h
  Administered 2019-11-18: 10 mL/h via INTRAVENOUS
  Administered 2019-11-18: 100 mL/h
  Administered 2019-11-18 – 2019-11-19 (×3): 100 mL/h via INTRAVENOUS

## 2019-11-17 MED ORDER — DEXTROSE 5 % FLUSH FOR PUMPS *I*
0.0000 mL/h | INTRAVENOUS | Status: DC | PRN
Start: 2019-11-17 — End: 2019-11-23
  Administered 2019-11-18: 275 mL/h via INTRAVENOUS
  Administered 2019-11-18: 27.5 mL/h via INTRAVENOUS
  Administered 2019-11-18: 10 mL/h via INTRAVENOUS
  Administered 2019-11-19: 27.5 mL/h via INTRAVENOUS
  Administered 2019-11-20 – 2019-11-21 (×3): 27 mL/h via INTRAVENOUS
  Administered 2019-11-22: 27.5 mL/h
  Administered 2019-11-22 (×2): 20 mL/h
  Administered 2019-11-22: 27.5 mL/h via INTRAVENOUS
  Administered 2019-11-22: 27 mL/h
  Administered 2019-11-22: 25 mL/h via INTRAVENOUS

## 2019-11-17 MED ORDER — FOLIC ACID 1 MG PO TABS *I*
1.0000 mg | ORAL_TABLET | Freq: Every day | ORAL | Status: DC
Start: 2019-11-18 — End: 2019-11-23
  Administered 2019-11-18 – 2019-11-23 (×6): 1 mg via ORAL
  Filled 2019-11-17 (×6): qty 1

## 2019-11-17 MED ORDER — LIDOCAINE 5 % EX PTCH *I*
1.0000 | MEDICATED_PATCH | CUTANEOUS | Status: DC
  Administered 2019-11-17: 1 via TRANSDERMAL
  Filled 2019-11-17: qty 1

## 2019-11-17 MED ORDER — ONDANSETRON HCL 2 MG/ML IV SOLN *I*
4.0000 mg | Freq: Once | INTRAMUSCULAR | Status: AC
Start: 2019-11-17 — End: 2019-11-17
  Administered 2019-11-17: 4 mg via INTRAVENOUS
  Filled 2019-11-17: qty 2

## 2019-11-17 MED ORDER — FLUCONAZOLE 100 MG PO TABS *I*
100.0000 mg | ORAL_TABLET | Freq: Every day | ORAL | Status: AC
Start: 2019-11-17 — End: 2019-11-23
  Administered 2019-11-17 – 2019-11-23 (×7): 100 mg via ORAL
  Filled 2019-11-17 (×8): qty 1

## 2019-11-17 MED ORDER — POTASSIUM CHLORIDE CRYS CR 20 MEQ PO TBCR *I*
40.0000 meq | ORAL_TABLET | Freq: Once | ORAL | Status: AC
Start: 2019-11-17 — End: 2019-11-17
  Administered 2019-11-17: 40 meq via ORAL
  Filled 2019-11-17: qty 2

## 2019-11-17 MED ORDER — FAMOTIDINE 20 MG PO TABS *I*
20.0000 mg | ORAL_TABLET | Freq: Once | ORAL | Status: AC
Start: 2019-11-17 — End: 2019-11-17
  Administered 2019-11-17: 20 mg via ORAL
  Filled 2019-11-17: qty 1

## 2019-11-17 MED ORDER — MULTIVITAMIN ADULTS 50+ PO TABS *I*
1.0000 | ORAL_TABLET | Freq: Every day | ORAL | Status: DC
Start: 2019-11-18 — End: 2019-11-23
  Administered 2019-11-18 – 2019-11-23 (×6): 1 via ORAL
  Filled 2019-11-17 (×8): qty 1

## 2019-11-17 MED ORDER — CALCIUM CARBONATE ANTACID 500 MG PO CHEW *I*
1000.0000 mg | CHEWABLE_TABLET | Freq: Three times a day (TID) | ORAL | Status: DC | PRN
Start: 2019-11-17 — End: 2019-11-23

## 2019-11-17 MED ORDER — SULFAMETHOXAZOLE-TRIMETHOPRIM 800-160 MG PO TABS *I*
1.0000 | ORAL_TABLET | Freq: Every day | ORAL | Status: DC
Start: 2019-11-18 — End: 2019-11-22
  Administered 2019-11-18 – 2019-11-22 (×5): 1 via ORAL
  Filled 2019-11-17 (×5): qty 1

## 2019-11-17 MED ORDER — ENOXAPARIN SODIUM 40 MG/0.4ML IJ SOSY *I*
40.0000 mg | PREFILLED_SYRINGE | Freq: Every day | INTRAMUSCULAR | Status: DC
Start: 2019-11-17 — End: 2019-11-17

## 2019-11-17 MED ORDER — PIPERACILLIN/TAZOBACTAM 4.5 G IN NS MINI-BAG PLUS 110 ML *I*
4.5000 g | Freq: Once | INTRAVENOUS | Status: AC
Start: 2019-11-17 — End: 2019-11-17
  Administered 2019-11-17: 4.5 g via INTRAVENOUS
  Filled 2019-11-17: qty 110

## 2019-11-17 MED ORDER — PANTOPRAZOLE SODIUM 40 MG IV SOLR *I*
40.0000 mg | Freq: Two times a day (BID) | INTRAVENOUS | Status: DC
Start: 2019-11-17 — End: 2019-11-18
  Administered 2019-11-17 – 2019-11-18 (×3): 40 mg via INTRAVENOUS
  Filled 2019-11-17 (×3): qty 10

## 2019-11-17 MED ORDER — ACETAMINOPHEN 500 MG PO TABS *I*
1000.0000 mg | ORAL_TABLET | Freq: Once | ORAL | Status: AC
Start: 2019-11-17 — End: 2019-11-17
  Administered 2019-11-17: 1000 mg via ORAL
  Filled 2019-11-17: qty 2

## 2019-11-17 MED ORDER — ACETAMINOPHEN 325 MG PO TABS *I*
650.0000 mg | ORAL_TABLET | Freq: Four times a day (QID) | ORAL | Status: DC | PRN
Start: 2019-11-17 — End: 2019-11-23
  Administered 2019-11-17 – 2019-11-22 (×6): 650 mg via ORAL
  Filled 2019-11-17 (×6): qty 2

## 2019-11-17 MED ORDER — SODIUM CHLORIDE 0.9 % FLUSH FOR PUMPS *I*
0.0000 mL/h | INTRAVENOUS | Status: DC | PRN
Start: 2019-11-17 — End: 2019-11-23
  Administered 2019-11-19: 100 mL/h via INTRAVENOUS
  Administered 2019-11-20: 190 mL/h via INTRAVENOUS
  Administered 2019-11-20: 190 mL/h
  Administered 2019-11-21: 190 mL/h via INTRAVENOUS
  Administered 2019-11-21 (×3): 190 mL/h
  Administered 2019-11-21: 190 mL/h via INTRAVENOUS
  Administered 2019-11-21: 190 mL/h
  Administered 2019-11-21 (×2): 20 mL/h
  Administered 2019-11-22: 25 mL/h
  Administered 2019-11-22: 25 mL/h via INTRAVENOUS
  Administered 2019-11-22: 25 mL/h
  Administered 2019-11-22: 189 mL/h via INTRAVENOUS
  Administered 2019-11-22: 189 mL/h

## 2019-11-17 MED ORDER — VANCOMYCIN HCL IN NACL 1500 MG/275 ML IV SOLN *I*
20.0000 mg/kg | Freq: Once | INTRAVENOUS | Status: AC
Start: 2019-11-17 — End: 2019-11-17
  Administered 2019-11-17: 1500 mg via INTRAVENOUS
  Filled 2019-11-17: qty 275

## 2019-11-17 MED ORDER — IOHEXOL 350 MG/ML (OMNIPAQUE) IV SOLN *I*
1.0000 mL | Freq: Once | INTRAVENOUS | Status: AC
Start: 2019-11-17 — End: 2019-11-17
  Administered 2019-11-17: 70 mL via INTRAVENOUS

## 2019-11-17 MED ORDER — SULFAMETHOXAZOLE-TRIMETHOPRIM 400-80 MG/5ML IV SOLN *I*
5.0000 mg/kg | Freq: Once | INTRAVENOUS | Status: AC
Start: 2019-11-17 — End: 2019-11-17
  Administered 2019-11-17: 351.52 mg via INTRAVENOUS
  Filled 2019-11-17: qty 21.97

## 2019-11-17 MED ORDER — PIPERACILLIN/TAZOBACTAM 4.5 G IN NS MINI-BAG PLUS 110 ML *I*
4.5000 g | Freq: Three times a day (TID) | INTRAVENOUS | Status: DC
Start: 2019-11-17 — End: 2019-11-22
  Administered 2019-11-17 – 2019-11-22 (×16): 4.5 g via INTRAVENOUS
  Filled 2019-11-17 (×4): qty 4.5
  Filled 2019-11-17: qty 110
  Filled 2019-11-17 (×2): qty 4.5
  Filled 2019-11-17: qty 110
  Filled 2019-11-17 (×5): qty 4.5
  Filled 2019-11-17 (×2): qty 110
  Filled 2019-11-17: qty 4.5
  Filled 2019-11-17 (×2): qty 110
  Filled 2019-11-17: qty 4.5

## 2019-11-17 MED ORDER — DEXTROSE 5 % FLUSH FOR PUMPS *I*
0.0000 mL/h | INTRAVENOUS | Status: DC | PRN
Start: 2019-11-17 — End: 2019-11-23

## 2019-11-17 MED ORDER — LACTATED RINGERS IV BOLUS *I*
1000.0000 mL | Freq: Once | INTRAVENOUS | Status: AC
Start: 2019-11-17 — End: 2019-11-17
  Administered 2019-11-17: 1000 mL via INTRAVENOUS

## 2019-11-17 MED ORDER — MELATONIN 3 MG PO TABS *I*
3.0000 mg | ORAL_TABLET | Freq: Every evening | ORAL | Status: DC | PRN
Start: 2019-11-17 — End: 2019-11-22
  Administered 2019-11-19: 3 mg via ORAL
  Filled 2019-11-17: qty 1

## 2019-11-17 NOTE — ED Notes (Signed)
Pharmacy pagedx2 regarding missing vancomycin.

## 2019-11-17 NOTE — Progress Notes (Signed)
11/17/19 0900   [facility] Patient Class Review   Patient Class Review Inpatient     Patient Class Effective 11/17/19    Thanh Mottern RN, BSN   [facility] Utilization Management   Emergency Dept- Pager# [phone]

## 2019-11-17 NOTE — ED Notes (Signed)
Patient arrives endorsing SOB, chest and leg pain, nausea, and fever for the past 4 days. History of HIV and is not currently taking his medications. Alert and oriented x4 and ambulatory. Febrile to 38.8.
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
## 2019-11-17 NOTE — ED Notes (Signed)
Pharmacy called regarding missing vancomycin. Pharmacist stated should be coming soon.
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
## 2019-11-17 NOTE — ED Notes (Signed)
Assumed care of patient at this time. Received report from previous nurse. Pt resting comfortably with call bell within reach.

## 2019-11-17 NOTE — ED Triage Notes (Signed)
Pt complaining of chest pain that woke him out of sleep. Pt describes pain as stabbing. Also having shortness of breath for past three days.  EKG in triage.        Triage Note   Maree Krabbe, RN

## 2019-11-17 NOTE — Progress Notes (Signed)
Hospital Medicine Progress Note     Patient seen & chart reviewed - case discussed with ID Fellow, Dr. Izola Price.       Subjective: reports 1-2 week history of malaise, fevers and chills, nausea with vomiting, headache as well as SOB and L sided chest discomfort. Chest discomfort described as sharp, difficult to take deep breath d/t pain. + cough with occasional white sputum, denies any hemoptysis. No sick contacts or travel in last year, has been isolating/distancing since start of pandemic. Reports he awoke around 0300 this AM with severe sharp pain to L side of chest, prompting ED eval. Reports nausea with emesis for the last week as well, only able to tolerate liquids. + occasional hematemesis described as bright red blood without coffee ground appearance. Denies any hematochezia or tarry stools. Does have abdominal pain as well, worse with vomiting. Admits to approximately 50 lb weight loss over last 8-10 months. Has not taken any of his medications since start of pandemic. Reports his mood is stable though does suffer from anxiety, no paranoia or hallucinations.       Objective:    Vitals:    11/17/19 0454 11/17/19 0458 11/17/19 0548 11/17/19 0944   BP: 119/80   97/63   Pulse:   (!) 118 80   Resp:   20 24   Temp:  (S) (!) 38.8 C (101.8 F)  37.6 C (99.6 F)   TempSrc:  (S) Axillary  Axillary   SpO2: 97%  99% 98%   Weight:           Physical Exam:   General: ill-appearing, toxic appearance. No acute distress.   Lungs: slightly diminished to LLL otherwise CTAB.   Heart: Regular rate and rhythm. No murmurs.    Abd: Soft, non-distended, tender to palpation of LUQ and epigastric region with voluntary guarding, no rebound tenderness. NABS.   Ext: No edema noted. Calves supple and non-tender.   Neuro: Alert and oriented x3. +photophobia. PERRL. EOMI, no nystagmus. Facies symmetric. Strength equal bilaterally. No nuchal rigidity appreciated.   Skin: Warm and diaphoretic.       Labs: reviewed    Recent Labs   Lab  11/17/19  0503   WBC 14.0*   Hemoglobin 13.2*   Hematocrit 39*   Platelets 181         Lab results: 11/17/19  0503 01/14/19  0257   Sodium  --  135   Potassium  --  CANCELED   Chloride  --  96   CO2  --  24   UN  --  7   Creatinine 0.70 0.83   GFR,Caucasian  --  108   GFR,Black  --  124   Glucose  --  90   Calcium  --  9.3       No results for input(s): INR in the last 168 hours.  Recent Labs     11/17/19  0503   AST 46   ALT 29   Bilirubin,Total 0.8   Bilirubin,Direct 0.3     No components found with this basename: ALKPHOS      No results for input(s): PGLU in the last 168 hours.    Assessment: Patient is a 44 yo male with PMH HIV (not currently on HAART), GERD, asthma, anxiety, schizophrenia, depression and ETOH abuse who presents with c/o chest pain and shortness of breath. Found to be septic on admission with LLL cavitary lung lesion.     Plan:  Sepsis secondary to LLL pneumonia/cavitary lung lesion   - tachycardic, tachypnea, fever on admission with leukocytosis and pulmonary source   - airborne precautions   - continue Vanc/Zosyn  - check MRSA nares   - sputum culture for bacteria and PJP, AFB x3  - blood culture isolator for AFB   - cryptococcal antigen, serum   - bacterial blood cultures sent, pending   - lactate 2.7 on admission, now s/p 3L LR, repeat   - LR at 1105ml/hr     HIV  - has been non-compliant with ART x several months   - last CD4 197, repeat today 54   - hold on resume ART pending crypto Ag   - PJP ppx with bactrim DS daily     Esophagitis   - on imaging with N/V and hematemesis   - DDx includes infectious, reflux  - CLD for now   - H/H BID   - IV PPI BID   - start empiric fluconazole 100mg  daily. Monitor QTc   - consider GI c/s if H/H dropping or significant bleeding     Headache with photophobia   - CT head without acute abnormality; no nuchal rigidity on exam, mental status intact  - tylenol PRN   - crypto Ag as above     Schizophrenia/Depression/Anxiety   - non-compliant with home  medications x several months   - prior home regimen: olanzapine 15mg  nightly, mirtazepine 22.5mg  nightly   - given prolonged QT and stable mood, will hold restarting at this time though would benefit from re-starting this admission     Prolonged QTc    - on EKG today   - replete lytes   - monitor closely on fluconazole, ID aware. Repeat EKG tomorrow     Hypokalemia   - 3.1, will give total K   - check Mag, replete PRN     Weight loss/Protein calorie malnutrition   - nutrition consult     Plan as above discussed with/per Infectious Disease.   DVT prophylaxis: IPCs, if no hematemsis next 24 hours, then LMWH  Dispo: pending progress     , PA  10:02 AM  11/17/2019    Scheduled Meds:   enoxaparin  40 mg Subcutaneous Daily @ 2100    pantoprazole  40 mg Intravenous Q12H    piperacillin-tazobactam  4.5 g Intravenous Q8H    trimethoprim-sulfamethoxazole  5 mg/kg Intravenous Q8H

## 2019-11-17 NOTE — Progress Notes (Addendum)
Vancomycin Maintenance Regimen - Pharmacist to Dose    Patient is receiving Vancomycin 1500 mg ONCE for Empiric Therapy. Today is day 1 of therapy.    Goal vancomycin trough is 15-20 mcg/mL.    Laboratory Data      Lab results: 11/17/19  0503 01/14/19  0250   WBC 14.0* 3.3*         Lab results: 11/17/19  0503 01/14/19  0257   Creatinine 0.70 0.83         Lab results: 01/14/19  0257   UN 7     Vancomycin concentrations:   No results for input(s): VANC, VANCT in the last 8760 hours.    Assessment and Plan   Considering patient's renal function, volume status, clinical status, and reported concentrations, 1000 mg Q8H will obtain goal vancomycin concentration unless renal function or clinical status changes.    Next vancomycin concentration prior to 4th dose, order placed to be drawn on 11/18/19 at 0630.    Pharmacist will follow for changes in renal function, toxicity, and efficacy and order serum concentrations and creatinine as needed.    Infectious Diseases is following the patient.    For questions contact pharmacy at extension [phone].    Eino Farber, PharmD

## 2019-11-17 NOTE — ED Notes (Signed)
Zosyn&LR infusing in separate IV lines.

## 2019-11-17 NOTE — ED Notes (Signed)
Report Given To  Randi RN       Descriptive Sentence / Reason for Admission   Patient arrives endorsing SOB, chest and leg pain, nausea, and fever for the past 4 days. History of HIV and is not currently taking his medications. Alert and oriented x4 and ambulatory. Febrile to 38.8.      Active Issues / Relevant Events   Chest pain and SOB, febrile  HIV+  Alert and oriented x4  Ambulatory  PNA - ruleout TB - possible fungal inf in lung   Reflex HIV testing collected        To Do List  VS  Assess  Tele  AFB cultureq8 - 1st one sent, due@1600       Anticipatory Guidance / Discharge Planning  Admit for AIDS w cavitary lesion, fever
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
## 2019-11-18 ENCOUNTER — Ambulatory Visit: Payer: Self-pay | Admitting: Student in an Organized Health Care Education/Training Program

## 2019-11-18 ENCOUNTER — Other Ambulatory Visit: Payer: Self-pay | Admitting: Cardiology

## 2019-11-18 DIAGNOSIS — I499 Cardiac arrhythmia, unspecified: Secondary | ICD-10-CM

## 2019-11-18 LAB — HIV VIRAL LOAD
HIV-1 PCR log: 4.5 copies/mL
HIV-1 PCR log: 4.5 copies/mL
HIV-1 PCR: 29600 copies/mL
HIV-1 PCR: 32800 copies/mL

## 2019-11-18 LAB — CBC AND DIFFERENTIAL
Baso # K/uL: 0 10*3/uL (ref 0.0–0.1)
Basophil %: 0.2 %
Eos # K/uL: 0 10*3/uL (ref 0.0–0.5)
Eosinophil %: 0.2 %
Hematocrit: 36 % — ABNORMAL LOW (ref 40–51)
Hemoglobin: 12.3 g/dL — ABNORMAL LOW (ref 13.7–17.5)
IMM Granulocytes #: 0.1 10*3/uL — ABNORMAL HIGH (ref 0.0–0.0)
IMM Granulocytes: 0.5 %
Lymph # K/uL: 0.7 10*3/uL — ABNORMAL LOW (ref 1.3–3.6)
Lymphocyte %: 7.4 %
MCH: 33 pg/cell — ABNORMAL HIGH (ref 26–32)
MCHC: 35 g/dL (ref 32–37)
MCV: 97 fL — ABNORMAL HIGH (ref 79–92)
Mono # K/uL: 1.3 10*3/uL — ABNORMAL HIGH (ref 0.3–0.8)
Monocyte %: 13.4 %
Neut # K/uL: 7.8 10*3/uL — ABNORMAL HIGH (ref 1.8–5.4)
Nucl RBC # K/uL: 0 10*3/uL (ref 0.0–0.0)
Nucl RBC %: 0 /100 WBC (ref 0.0–0.2)
Platelets: 170 10*3/uL (ref 150–330)
RBC: 3.7 MIL/uL — ABNORMAL LOW (ref 4.6–6.1)
RDW: 11.1 % — ABNORMAL LOW (ref 11.6–14.4)
Seg Neut %: 78.3 %
WBC: 10 10*3/uL — ABNORMAL HIGH (ref 4.2–9.1)

## 2019-11-18 LAB — MCHC: MCHC: 34 g/dL (ref 32–37)

## 2019-11-18 LAB — AFB STAIN
AFB Stain: 0
AFB Stain: 0

## 2019-11-18 LAB — VANCOMYCIN, TROUGH
Vancomycin Trough: 12.6 ug/mL (ref 10.0–20.0)
Vancomycin Trough: 10.6 ug/mL (ref 10.0–20.0)

## 2019-11-18 LAB — PHOSPHORUS: Phosphorus: 2.7 mg/dL (ref 2.7–4.5)

## 2019-11-18 LAB — GRAM STAIN

## 2019-11-18 LAB — COMPREHENSIVE METABOLIC PANEL
ALT: 20 U/L (ref 0–50)
AST: 30 U/L (ref 0–50)
Albumin: 2.9 g/dL — ABNORMAL LOW (ref 3.5–5.2)
Alk Phos: 54 U/L (ref 40–130)
Anion Gap: 13 (ref 7–16)
Bilirubin,Total: 0.9 mg/dL (ref 0.0–1.2)
CO2: 20 mmol/L (ref 20–28)
Calcium: 8.5 mg/dL — ABNORMAL LOW (ref 9.0–10.3)
Chloride: 99 mmol/L (ref 96–108)
Creatinine: 0.83 mg/dL (ref 0.67–1.17)
GFR,Black: 124 *
GFR,Caucasian: 107 *
Glucose: 97 mg/dL (ref 60–99)
Lab: <4 mg/dL — ABNORMAL LOW (ref 6–20)
Potassium: 3.5 mmol/L (ref 3.3–5.1)
Sodium: 132 mmol/L — ABNORMAL LOW (ref 133–145)
Total Protein: 6.6 g/dL (ref 6.3–7.7)

## 2019-11-18 LAB — AEROBIC CULTURE: Aerobic Culture: 0

## 2019-11-18 LAB — HCT AND HGB
Hematocrit: 36 % — ABNORMAL LOW (ref 40–51)
Hemoglobin: 12.2 g/dL — ABNORMAL LOW (ref 13.7–17.5)

## 2019-11-18 LAB — ASPERGILLUS ANTIGEN

## 2019-11-18 LAB — LACTATE, PLASMA: Lactate: 1.1 mmol/L (ref 0.5–2.2)

## 2019-11-18 LAB — MAGNESIUM: Magnesium: 1.8 mg/dL (ref 1.6–2.5)

## 2019-11-18 LAB — CRYPTOCOCCAL ANTIGEN: Cryptococcus Antigen: 0

## 2019-11-18 MED ORDER — ARTIFICIAL TEARS OP OINT *WRAPPED* *I*
TOPICAL_OINTMENT | Freq: Every evening | OPHTHALMIC | Status: DC
Start: 2019-11-18 — End: 2019-11-23
  Filled 2019-11-18 (×2): qty 1

## 2019-11-18 MED ORDER — BEN GAY GREASELESS EX *WRAPPED*
TOPICAL_CREAM | Freq: Four times a day (QID) | CUTANEOUS | Status: DC | PRN
Start: 2019-11-18 — End: 2019-11-23
  Filled 2019-11-18: qty 85

## 2019-11-18 MED ORDER — ACYCLOVIR 200 MG/5ML PO SUSP *I*
400.0000 mg | Freq: Every day | ORAL | Status: DC
Start: 2019-11-18 — End: 2019-11-18

## 2019-11-18 MED ORDER — POTASSIUM CHLORIDE 20 MEQ/15ML (10%) PO SOLN *I*
40.0000 meq | Freq: Once | ORAL | Status: AC
Start: 2019-11-18 — End: 2019-11-18
  Administered 2019-11-18: 40 meq via ORAL
  Filled 2019-11-18: qty 30

## 2019-11-18 MED ORDER — PANTOPRAZOLE SODIUM 40 MG PO TBEC *I*
40.0000 mg | DELAYED_RELEASE_TABLET | Freq: Two times a day (BID) | ORAL | Status: DC
Start: 2019-11-18 — End: 2019-11-23
  Administered 2019-11-18 – 2019-11-23 (×10): 40 mg via ORAL
  Filled 2019-11-18 (×10): qty 1

## 2019-11-18 MED ORDER — SODIUM CHLORIDE 0.9 % IV SOLN WRAPPED *I*
10.0000 mg/kg | Freq: Three times a day (TID) | INTRAVENOUS | Status: DC
Start: 2019-11-18 — End: 2019-11-22
  Administered 2019-11-18 – 2019-11-22 (×13): 703 mg via INTRAVENOUS
  Filled 2019-11-18 (×16): qty 14.06

## 2019-11-18 MED ORDER — CARBOXYMETHYLCELLULOSE SODIUM 0.5 % OP SOLN WRAPPED *I*
1.0000 [drp] | Freq: Four times a day (QID) | OPHTHALMIC | Status: DC
Start: 2019-11-18 — End: 2019-11-23
  Administered 2019-11-18 – 2019-11-23 (×19): 1 [drp] via OPHTHALMIC
  Filled 2019-11-18 (×11): qty 1
  Filled 2019-11-18: qty 2
  Filled 2019-11-18 (×5): qty 1

## 2019-11-18 MED ORDER — BICTEGRAVIR-EMTRICITAB-TENOFOV 50-200-25 MG PO TABS *I*
1.0000 | ORAL_TABLET | Freq: Every day | ORAL | Status: DC
Start: 2019-11-18 — End: 2019-11-23
  Administered 2019-11-18 – 2019-11-23 (×6): 1 via ORAL
  Filled 2019-11-18 (×6): qty 1

## 2019-11-18 MED ORDER — LIDOCAINE 5 % EX PTCH *I*
2.0000 | MEDICATED_PATCH | CUTANEOUS | Status: DC
  Administered 2019-11-18 – 2019-11-22 (×5): 2 via TRANSDERMAL
  Filled 2019-11-18 (×5): qty 2

## 2019-11-18 MED ORDER — VANCOMYCIN HCL IN NACL 1250 MG/275 ML IV SOLN *I*
1250.0000 mg | Freq: Three times a day (TID) | INTRAVENOUS | Status: DC
Start: 2019-11-19 — End: 2019-11-19
  Administered 2019-11-19: 1250 mg via INTRAVENOUS
  Filled 2019-11-18: qty 1250
  Filled 2019-11-18 (×3): qty 275

## 2019-11-18 NOTE — Progress Notes (Addendum)
Hospital Medicine Service Progress Note     Significant Events/Subjective:     Seen this AM.  Reports decreased pain chest and L flank.  Still with productive cough, now yellow, white yesterday.  HA and R eye photophobia unchanged. + fever/ chills.  No nausea, vomiting, abdominal pain, dysphagia.      Objective:     Physical Exam  Temp:  [36.4 C (97.6 F)-37.9 C (100.2 F)] 37 C (98.6 F)  Heart Rate:  [81-97] 81  Resp:  [18-22] 18  BP: (106-128)/(71-85) 113/71     Constitutional: lying in bed, NAD  CV: RRR, no murmur  Resp: normal work of breathing, decreased breath sounds  GI: BS, NT/ ND   Neuro/Psych: AOx3    Recent Lab, Micro, and Imaging Studies   Personally reviewed and notable for:  Numerous labs, sputum pending     Current Inpatient Medications:   lidocaine  2 patch Transdermal Q24H    pantoprazole  40 mg Intravenous Q12H    piperacillin-tazobactam  4.5 g Intravenous Q8H    vancomycin  1,000 mg Intravenous Q8H    sulfamethoxazole-trimethoprim  1 tablet Oral Daily    fluconazole  100 mg Oral Daily    protein supplement  30 mL Oral BID WC    Vitamins - senior  1 tablet Oral Daily    thiamine  100 mg Oral Daily    folic acid  1 mg Oral Daily     methyl salicylate-menthol, sodium chloride, dextrose, acetaminophen, sodium chloride, dextrose, calcium carbonate, melatonin      Assessment:   Patient is a 44 yo male with PMH HIV (not currently on HAART), GERD, asthma, anxiety, schizophrenia, depression and ETOH abuse who presents with c/o chest pain and shortness of breath. Found to be septic on admission with LLL cavitary lung lesion.     Plans:  Sepsis secondary to LLL pneumonia/cavitary lung lesion   - tachycardic, tachypnea, fever on admission with leukocytosis and pulmonary source   - airborne precautions   - continue Vanc/Zosyn  - MRSA nares neg   - sputum culture for bacteria and PJP, AFB x3 in process   - blood culture isolator for AFB pending   - cryptococcal antigen, serum neg   - bacterial  blood cultures sent, pending   - LR at 173ml/hr   - lidocaine patch x 2, tylenol, ben gay, heat packs    HIV  - has been non-compliant with ART x several months   - last CD4 197, repeat 54   - hold on resume ART pending ID input   - PJP ppx with bactrim DS daily     Esophagitis   - CLD for now.  Consider advancing   - H/H BID   - IV PPI BID   - start empiric fluconazole 100mg  daily. Monitor QTc   - consider GI c/s if H/H dropping or significant bleeding     Headache with photophobia   - CT head without acute abnormality; no nuchal rigidity on exam, mental status intact  - tylenol PRN   - crypto Ag as above   - optho consulted, concern for HSV ophthalmicus?       Schizophrenia/Depression/Anxiety   - non-compliant with home medications x several months   - prior home regimen: olanzapine 15mg  nightly, mirtazepine 22.5mg  nightly   - given prolonged QT and stable mood, will hold restarting at this time though would benefit from re-starting this admission     Prolonged QTc    -  550ms yesterday, repeat today  - replete lytes   - monitor closely on fluconazole, ID aware.     Hypokalemia   - 3.5, will give 27meq K   - check Mag, replete PRN     Weight loss/Protein calorie malnutrition   - nutrition consult.  Ensure/ prosource     DVT PPx: IPC     Case discussed with ID fellow, bedside RN.      Zannie Cove, PA on 11/18/2019 at 10:41 AM     Addendum  Advance to soft diet   Start IV acyclovir

## 2019-11-18 NOTE — Progress Notes (Signed)
Pt arrived to the unit via bed. Pt A&O x3, vss,RA. Pt walked to bed and is resting. Pt IV in right forearm blown and IV in left arm blown upon arrival. Will monitor.

## 2019-11-18 NOTE — Progress Notes (Signed)
Vancomycin Maintenance Regimen - Pharmacist to Dose    Patient is receiving Vancomycin 1000 mg Q8H for Empiric Therapy. Today is day 2 of therapy.    Goal vancomycin trough is 15-20 mcg/mL.    Laboratory Data      Lab results: 11/18/19  0100 11/17/19  0503 01/14/19  0250   WBC 10.0* 14.0* 3.3*         Lab results: 11/18/19  0100 11/17/19  0503 01/14/19  0257   Creatinine 0.83 0.70 0.83         Lab results: 11/18/19  0100 01/14/19  0257   UN <4* 7     Vancomycin concentrations:       Lab results: 11/18/19  0639   Vancomycin Trough 12.6       Assessment and Plan   Vancomycin doses yesterday (11/17/19) were given ~ 12 hours apart. Vancomycin trough was drawn before the 3rd dose and ~ 5 hours post dose. This level is difficult to interpret therefore will order a trough before the 1730 dose tonight.    Next vancomycin concentration prior to next dose, order placed to be drawn on 11/18/19 at 1700.    Pharmacist will follow for changes in renal function, toxicity, and efficacy and order serum concentrations and creatinine as needed.    Infectious Diseases is following the patient    For questions contact pharmacy at extension [phone]    Danice Goltz, PharmD

## 2019-11-18 NOTE — Progress Notes (Signed)
Pharmacist Prior-to-Admission Medication History Review    I reviewed the medication history that was performed by a medication history technician and the list below reflects the best possible prior to admission medication list.    Patient lives at: Home    Sources of information:  Patient;Outside Meds Reconciliation (DrFirst);Pharmacy    Patient's pharmacy:  [facility]. Medisets/compliance packaging?: No    Medication history performed by: Jabier Gauss CPhT    Comments:    Per the medication history technician - Patient admitted he has not been taking any medications since the pandemic started about a year ago in 2020 because he was too scared to be in contact with people at the pharmacy.     This list is accurate to the best of my knowledge.    Medications Prior to Admission   Medication Sig    acetaminophen (TYLENOL) 500 mg tablet Take 500 mg by mouth every 6 hours as needed for Pain    omeprazole (PRILOSEC) 40 MG capsule Take 1 capsule (40 mg total) by mouth daily    ammonium lactate (LAC-HYDRIN) 12 % lotion Apply topically 2 times daily as needed for Dry Skin    naltrexone (DEPADE) 50 MG tablet Take 1 tablet (50 mg total) by mouth daily    OLANZapine (ZYPREXA) 5 MG tablet Take 1 tablet (5 mg total) by mouth daily    OLANZapine (ZYPREXA) 10 MG tablet Take 1 tablet (10 mg total) by mouth nightly TDD 15mg     efavirenz-emtrictabine-tenofovir (ATRIPLA) 600-200-300 MG per tablet Take 1 tablet by mouth nightly on an empty stomach    sulfamethoxazole-trimethoprim (BACTRIM DS,SEPTRA DS) 800-160 MG per tablet Take 1 tablet by mouth daily    gabapentin (GABAPENTIN) 100MG  capsule Take 1 capsule (100 mg total) by mouth 3 times daily    mirtazapine (REMERON) 7.5 MG tablet Take 1 tablet (7.5 mg total) by mouth nightly    mirtazapine (REMERON) 15 MG tablet Take 1 tablet (15 mg total) by mouth nightly    nicotine polacrilex (NICORETTE) 4 MG gum Take 1 each (4 mg total) by mouth as needed for Smoking cessation  Max daily dose: 24 mg        Internal Medicine Pharmacy Specialist  X [phone]

## 2019-11-18 NOTE — Progress Notes (Signed)
Vancomycin Maintenance Regimen - Pharmacist to Dose    Patient is receiving Vancomycin 1000 mg Q8H for Empiric Therapy. Today is day 2 of therapy.    Goal vancomycin trough is 15-20 mcg/mL.    Laboratory Data      Lab results: 11/18/19  0100 11/17/19  0503 01/14/19  0250   WBC 10.0* 14.0* 3.3*         Lab results: 11/18/19  0100 11/17/19  0503 01/14/19  0257   Creatinine 0.83 0.70 0.83         Lab results: 11/18/19  0100 01/14/19  0257   UN <4* 7     Vancomycin concentrations:       Lab results: 11/18/19  1736   Vancomycin Trough 10.6       Assessment and Plan   Considering patient's renal function, volume status, clinical status, and reported concentrations, 1250 mg Q8H will obtain goal vancomycin concentration unless renal function or clinical status changes.    Next vancomycin concentration before the 4th or 5th dose, order placed to be drawn on 11/20/19 at 0130.    Pharmacist will follow for changes in renal function, toxicity, and efficacy and order serum concentrations and creatinine as needed.    Infectious Diseases is following the patient    For questions contact pharmacy at extension [phone]    Lenoria Chime, PharmD
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
## 2019-11-18 NOTE — ED Notes (Signed)
Report Given To  Caryn Bee and [facility]       Descriptive Sentence / Reason for Admission   Patient arrives endorsing SOB, chest and leg pain, nausea, and fever for the past 4 days. History of HIV and is not currently taking his medications. Alert and oriented x4 and ambulatory. Febrile to 38.8.      Active Issues / Relevant Events   Chest pain and SOB, febrile  HIV+  Alert and oriented x4  Ambulatory  PNA - ruleout TB - possible fungal inf in lung   Reflex HIV testing collected        To Do List  VS  Assess  Tele  AFB culture q8       Anticipatory Guidance / Discharge Planning  Admit for AIDS w cavitary lesion, fever
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
## 2019-11-19 LAB — EKG 12-LEAD
P: 51 deg
PR: 155 ms
QRS: 53 deg
QRSD: 90 ms
QT: 411 ms
QTc: 460 ms
Rate: 75 {beats}/min
T: 56 deg

## 2019-11-19 LAB — MAGNESIUM: Magnesium: 1.8 mg/dL (ref 1.6–2.5)

## 2019-11-19 LAB — COMPREHENSIVE METABOLIC PANEL
ALT: 16 U/L (ref 0–50)
AST: 24 U/L (ref 0–50)
Albumin: 2.9 g/dL — ABNORMAL LOW (ref 3.5–5.2)
Alk Phos: 48 U/L (ref 40–130)
Anion Gap: 11 (ref 7–16)
Bilirubin,Total: 0.5 mg/dL (ref 0.0–1.2)
CO2: 20 mmol/L (ref 20–28)
Calcium: 8.6 mg/dL — ABNORMAL LOW (ref 9.0–10.3)
Chloride: 103 mmol/L (ref 96–108)
Creatinine: 0.81 mg/dL (ref 0.67–1.17)
GFR,Black: 125 *
GFR,Caucasian: 108 *
Glucose: 149 mg/dL — ABNORMAL HIGH (ref 60–99)
Lab: 4 mg/dL — ABNORMAL LOW (ref 6–20)
Potassium: 3.2 mmol/L — ABNORMAL LOW (ref 3.3–5.1)
Sodium: 134 mmol/L (ref 133–145)
Total Protein: 6.2 g/dL — ABNORMAL LOW (ref 6.3–7.7)

## 2019-11-19 LAB — CBC AND DIFFERENTIAL
Baso # K/uL: 0 10*3/uL (ref 0.0–0.1)
Basophil %: 0.3 %
Eos # K/uL: 0.1 10*3/uL (ref 0.0–0.5)
Eosinophil %: 0.9 %
Hematocrit: 34 % — ABNORMAL LOW (ref 40–51)
Hemoglobin: 11.5 g/dL — ABNORMAL LOW (ref 13.7–17.5)
IMM Granulocytes #: 0 10*3/uL (ref 0.0–0.0)
IMM Granulocytes: 0.3 %
Lymph # K/uL: 0.6 10*3/uL — ABNORMAL LOW (ref 1.3–3.6)
Lymphocyte %: 9.7 %
MCH: 32 pg/cell (ref 26–32)
MCHC: 34 g/dL (ref 32–37)
MCV: 96 fL — ABNORMAL HIGH (ref 79–92)
Mono # K/uL: 1 10*3/uL — ABNORMAL HIGH (ref 0.3–0.8)
Monocyte %: 16.3 %
Neut # K/uL: 4.6 10*3/uL (ref 1.8–5.4)
Nucl RBC # K/uL: 0 10*3/uL (ref 0.0–0.0)
Nucl RBC %: 0 /100 WBC (ref 0.0–0.2)
Platelets: 192 10*3/uL (ref 150–330)
RBC: 3.6 MIL/uL — ABNORMAL LOW (ref 4.6–6.1)
RDW: 11.2 % — ABNORMAL LOW (ref 11.6–14.4)
Seg Neut %: 72.5 %
WBC: 6.4 10*3/uL (ref 4.2–9.1)

## 2019-11-19 LAB — PNEUMOCYSTIS DNA PCR: Pneumocystis DNA PCR: 0

## 2019-11-19 LAB — PHOSPHORUS: Phosphorus: 2.8 mg/dL (ref 2.7–4.5)

## 2019-11-19 LAB — AFB STAIN: AFB Stain: 0

## 2019-11-19 MED ORDER — POTASSIUM CHLORIDE CRYS CR 20 MEQ PO TBCR *I*
40.0000 meq | ORAL_TABLET | Freq: Once | ORAL | Status: AC
Start: 2019-11-19 — End: 2019-11-19
  Administered 2019-11-19: 40 meq via ORAL
  Filled 2019-11-19: qty 2

## 2019-11-19 MED ORDER — MIRTAZAPINE 15 MG PO TABS *I*
7.5000 mg | ORAL_TABLET | Freq: Every evening | ORAL | Status: DC
Start: 2019-11-19 — End: 2019-11-22
  Administered 2019-11-19 – 2019-11-21 (×3): 7.5 mg via ORAL
  Filled 2019-11-19 (×4): qty 1

## 2019-11-19 NOTE — Progress Notes (Signed)
Acute Care Coordinator: Discharge Planning    Hospital Problem:   Chest pain    Address/phone/PCP: see facesheet  Home Set Up/Lives With: spouse  CG/Supports/ADL's: self   Medication Management: self  Current DME: none at this time  Home Care: none at this time    Discharge Plan/Dispo:   To home when medically cleared      Follow-up Appts:  TBD    Leda Gauze, RN  Acute Care Coordinator  [phone]    [facility] Coordinator pager  [phone]

## 2019-11-19 NOTE — Plan of Care (Signed)
Problem: Safety  Goal: Patient will remain free of falls  Outcome: Maintaining     Problem: Pain/Comfort  Goal: Patient's pain or discomfort is manageable  Outcome: Maintaining     Problem: Nutrition  Goal: Patient's nutritional status is maintained or improved  Outcome: Maintaining     Problem: Psychosocial  Goal: Demonstrates ability to cope with illness  Outcome: Maintaining
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
## 2019-11-19 NOTE — Plan of Care (Signed)
Patient seen & chart reviewed. Discussed with ID.    No acute events overnight. Reports that he is doing well. Tolerating po intake. Right eye feeling better. Denies pain or SOB      Vitals:    11/19/19 0136 11/19/19 0829 11/19/19 1042 11/19/19 1525   BP: 107/58 91/57 101/65 110/65   BP Location: Left arm Left arm Left arm Left arm   Pulse: 94 88 71 97   Resp: 18 22 20 20    Temp: 37.3 C (99.1 F) 37.2 C (99 F) 37.2 C (99 F) 36.8 C (98.2 F)   TempSrc: Oral Temporal Oral Temporal   SpO2: 99% 97% 98% 100%   Weight:         Exam:  General: Lying in bed, in NAD  HEENT: Erythema in right conjunctiva   Lungs: Regular rate, breath sounds clear  Heart: RRR, S1, S2  Abd: Soft, non-tender, non-distended  Ext: Moves all extremities.     Labs: reviewed    Recent Labs   Lab 11/19/19  0134 11/18/19  1141 11/18/19  0100 11/17/19  0503 11/17/19  0503   WBC 6.4  --  10.0*  --  14.0*   Hemoglobin 11.5* 12.2* 12.3*   < > 13.2*   Hematocrit 34* 36* 36*   < > 39*   Platelets 192  --  170  --  181    < > = values in this interval not displayed.         Lab results: 11/19/19  0134 11/18/19  0100 11/17/19  0503 01/14/19  0257   Sodium 134 132*  --  135   Potassium 3.2* 3.5  --  CANCELED   Chloride 103 99  --  96   CO2 20 20  --  24   UN 4* <4*  --  7   Creatinine 0.81 0.83 0.70 0.83   GFR,Caucasian 108 107  --  108   GFR,Black 125 124  --  124   Glucose 149* 97  --  90   Calcium 8.6* 8.5*  --  9.3       No results for input(s): INR in the last 168 hours.  Recent Labs     11/19/19  0134 11/18/19  0100 11/17/19  0503   AST 24 30 46   ALT 16 20 29    Bilirubin,Total 0.5 0.9 0.8   Bilirubin,Direct  --   --  0.3     No components found with this basename: ALKPHOS      No results for input(s): PGLU in the last 168 hours.    A/P:     Sepsis secondary to LLL pneumonia/cavitary lung lesion:  - Follow sputum culture, PJP and AFB  - Reordered for aspergillus   - MRSA negative   - D/c IV Vanco, continue Zosyn  - Continue airborne precautions   -  D/c IVF as tolerating po intake     HIV  - has been non-compliant with ART x several months   - Last CD4 197, repeat 54   - Restarted on Biktarvy   - PJP PPX with bactrim DS daily    Esophagitis/ Candidiasis   - Continue Fluconazole daily x 7 days   - Qtc 3/4 was 460    Right Eye Pain likely 2/2 HSV Keratitis in setting of HIV/AIDS  - Continue IV Acyclovir, started on 3/4  - Continue refresh and lubrifresh ggt     Schizophrenia/Depression/Anxiety   - Non-compliant with  home medications x several months   - Prior home regimen: olanzapine 15mg  nightly, mirtazepine 22.5mg  nightly   - D/w Pharmacy, will restart mirtazapine at 7.5mg  hs, would not restart olanzapine if pt is non-compliant with follow up     DVT prophylaxis: IPCs  Dispo: Defer pending med stability     Derek Walker B Beckett Maden, NP  5:10 PM  11/19/2019    Medications Prior to Admission   Medication Sig    acetaminophen (TYLENOL) 500 mg tablet Take 500 mg by mouth every 6 hours as needed for Pain    omeprazole (PRILOSEC) 40 MG capsule Take 1 capsule (40 mg total) by mouth daily    ammonium lactate (LAC-HYDRIN) 12 % lotion Apply topically 2 times daily as needed for Dry Skin    naltrexone (DEPADE) 50 MG tablet Take 1 tablet (50 mg total) by mouth daily    OLANZapine (ZYPREXA) 5 MG tablet Take 1 tablet (5 mg total) by mouth daily    OLANZapine (ZYPREXA) 10 MG tablet Take 1 tablet (10 mg total) by mouth nightly TDD 15mg     efavirenz-emtrictabine-tenofovir (ATRIPLA) 600-200-300 MG per tablet Take 1 tablet by mouth nightly on an empty stomach    sulfamethoxazole-trimethoprim (BACTRIM DS,SEPTRA DS) 800-160 MG per tablet Take 1 tablet by mouth daily    gabapentin (GABAPENTIN) 100MG  capsule Take 1 capsule (100 mg total) by mouth 3 times daily    mirtazapine (REMERON) 7.5 MG tablet Take 1 tablet (7.5 mg total) by mouth nightly    mirtazapine (REMERON) 15 MG tablet Take 1 tablet (15 mg total) by mouth nightly    nicotine polacrilex (NICORETTE) 4 MG gum Take 1 each (4  mg total) by mouth as needed for Smoking cessation Max daily dose: 24 mg     Scheduled Meds:   mirtazapine  7.5 mg Oral Nightly    lidocaine  2 patch Transdermal Q24H    acyclovir  10 mg/kg Intravenous Q8H    carboxymethylcellulose  1 drop Both Eyes 4x Daily    ARTIFICIAL TEARS   Both Eyes Nightly    pantoprazole  40 mg Oral BID AC    bictegravir-emtricitabine-tenofovir  1 tablet Oral Daily    piperacillin-tazobactam  4.5 g Intravenous Q8H    sulfamethoxazole-trimethoprim  1 tablet Oral Daily    fluconazole  100 mg Oral Daily    protein supplement  30 mL Oral BID WC    Vitamins - senior  1 tablet Oral Daily    thiamine  100 mg Oral Daily    folic acid  1 mg Oral Daily

## 2019-11-20 DIAGNOSIS — H1031 Unspecified acute conjunctivitis, right eye: Secondary | ICD-10-CM

## 2019-11-20 DIAGNOSIS — H3091 Unspecified chorioretinal inflammation, right eye: Secondary | ICD-10-CM

## 2019-11-20 DIAGNOSIS — B370 Candidal stomatitis: Secondary | ICD-10-CM

## 2019-11-20 LAB — COMPREHENSIVE METABOLIC PANEL
ALT: 16 U/L (ref 0–50)
AST: 22 U/L (ref 0–50)
Albumin: 3.1 g/dL — ABNORMAL LOW (ref 3.5–5.2)
Alk Phos: 52 U/L (ref 40–130)
Anion Gap: 9 (ref 7–16)
Bilirubin,Total: 0.3 mg/dL (ref 0.0–1.2)
CO2: 21 mmol/L (ref 20–28)
Calcium: 8.8 mg/dL — ABNORMAL LOW (ref 9.0–10.3)
Chloride: 109 mmol/L — ABNORMAL HIGH (ref 96–108)
Creatinine: 0.76 mg/dL (ref 0.67–1.17)
GFR,Black: 128 *
GFR,Caucasian: 111 *
Glucose: 109 mg/dL — ABNORMAL HIGH (ref 60–99)
Lab: 4 mg/dL — ABNORMAL LOW (ref 6–20)
Potassium: 3.9 mmol/L (ref 3.3–5.1)
Sodium: 139 mmol/L (ref 133–145)
Total Protein: 6.4 g/dL (ref 6.3–7.7)

## 2019-11-20 LAB — CBC AND DIFFERENTIAL
Baso # K/uL: 0 10*3/uL (ref 0.0–0.1)
Basophil %: 0.3 %
Eos # K/uL: 0.1 10*3/uL (ref 0.0–0.5)
Eosinophil %: 2.9 %
Hematocrit: 34 % — ABNORMAL LOW (ref 40–51)
Hemoglobin: 11.7 g/dL — ABNORMAL LOW (ref 13.7–17.5)
IMM Granulocytes #: 0 10*3/uL (ref 0.0–0.0)
IMM Granulocytes: 0.6 %
Lymph # K/uL: 0.7 10*3/uL — ABNORMAL LOW (ref 1.3–3.6)
Lymphocyte %: 19.9 %
MCH: 34 pg — ABNORMAL HIGH (ref 26–32)
MCHC: 34 g/dL (ref 32–37)
MCV: 98 fL — ABNORMAL HIGH (ref 79–92)
Mono # K/uL: 0.8 10*3/uL (ref 0.3–0.8)
Monocyte %: 24 %
Neut # K/uL: 1.8 10*3/uL (ref 1.8–5.4)
Nucl RBC # K/uL: 0 10*3/uL (ref 0.0–0.0)
Nucl RBC %: 0 /100 WBC (ref 0.0–0.2)
Platelets: 237 10*3/uL (ref 150–330)
RBC: 3.5 MIL/uL — ABNORMAL LOW (ref 4.6–6.1)
RDW: 11.4 % — ABNORMAL LOW (ref 11.6–14.4)
Seg Neut %: 52.3 %
WBC: 3.4 10*3/uL — ABNORMAL LOW (ref 4.2–9.1)

## 2019-11-20 LAB — PHOSPHORUS: Phosphorus: 4.3 mg/dL (ref 2.7–4.5)

## 2019-11-20 LAB — AEROBIC CULTURE: Aerobic Culture: 0

## 2019-11-20 LAB — MAGNESIUM: Magnesium: 2.1 mg/dL (ref 1.6–2.5)

## 2019-11-20 NOTE — Progress Notes (Signed)
Infectious Diseases Progress/Follow-Up Note    Subjective: Patient seen and examined in PM. Patient appears comfortable. States he is feeling well, reports significant improvement in terms of shortness of breath. Denies worsening cough (ongoing dry cough), chest pain. Denies nausea, vomiting, abdominal pain, diarrhea, rash.     Objective: No significant overnight events. Tmax 36.8 degrees Celsius past 24 hours.     Physical Exam:  BP: (94-139)/(62-81)   Temp:  [36.2 C (97.2 F)-36.8 C (98.2 F)]   Temp src: Oral (03/06 1110)  Heart Rate:  [79-105]   Resp:  [18-20]   SpO2:  [97 %-100 %]   General Appearance: Male patient of stated age, resting comfortably in chair, in no acute distress.    HEENT: Normocephalic, atraumatic head. Conjunctival erythema noted right eye. Wearing mask. Neck supple.   Cardiovascular: Regular rate and rhythm. No murmurs, rubs, or gallops.   Pulmonary: Breathing comfortably on ambient air. Bilateral breath sounds clear to auscultation. No wheezing, rales, or rhonchi.   Abdomen: Soft, nondistended, nontender. Bowel sounds normoactive.   Extremities: No edema.    Neurologic: Grossly, no focal neurologic deficits noted.  Mood: Appropriate.     Laboratory, Imaging, Microbiology Data:  Recent Labs   Lab 11/20/19  0352 11/19/19  0134 11/18/19  1141 11/18/19  0100   WBC 3.4* 6.4  --  10.0*   Hemoglobin 11.7* 11.5* 12.2* 12.3*   Hematocrit 34* 34* 36* 36*   Platelets 237 192  --  170     Recent Labs   Lab 11/20/19  0352 11/19/19  0134 11/18/19  0100   Sodium 139 134 132*   Potassium 3.9 3.2* 3.5   CO2 _0 UN 4* 4* <4*   Creatinine 0.76 0.81 0.83   Glucose 109* 149* 97   Calcium 8.8* 8.6* 8.5*         Lab results: 11/20/19  0352 11/17/19  0503 11/17/19  0503   Total Protein 6.4   < > 7.7   Albumin 3.1*   < > 3.8   ALT 16   < > 29   AST 22   < > 46   Alk Phos 52   < > 65   Bilirubin,Total 0.3   < > 0.8   Bilirubin,Direct  --   --  0.3    < > = values in this interval not displayed.     No  results for input(s): CRP in the last 168 hours.     Lab Results   Component Value Date    ESR 19 (H) 09/24/2013     Microbiology Data:    Recent:    TB AG t-cell stimulation 03/03L: in progress    Pneumocytis DNA PCR sputum 03/03: negative     AFB blood isolator culture 03/03: no growth to date    AFB stain 03/03, 03/04 x3: smear negative for AFB  AFB culture 03/03, 03/04: pending    Sputum 03/04:  Gram stain: Gram positive cocci in pairs and clusters  Culture: normal throat flora only    Aspergillus antigen sputum 03/05: in process    Antimicrobial Therapy:  -Vancomycin 03/03 - 03/05  -Piperacillin-tazobactam 03/03 - present  -Fluconazole 03/03 - present  -Acyclovir 03/04 - present    Prophylaxis:  -TMP-SMX 03/04 - present    Assessment and Plan/Recommendations: 44 year old male patient with past medical history significant for HIV/AIDS (most recent CD4 count 54 cells/uL, viral load 29, 600 copies/mL) with history  of noncompliance, asthma, mood disorder, who presented 03/03 with history of fever, progressive dyspnea, and chest pain, and found to have left lower lobe cavitary lesion on imaging.     1. HIV/AIDS: most recent CD4 count 54 cells/uL, viral load 29, 600 copies/mL 03/03. Due to CD4 count, work-up for opportunistic infection obtained. Cryptococcal antigen, Pneumocystis DNA PCR, AFB smear x3 negative and airborne precautions discontinued. Resumed on Biktarvy.  -Continue Biktarvy  -Follow resistance pattern   -Continue PJP prophylaxis with TMP-SMX  -Follow AFB isolator culture   -Airborne precautions discontinued    2. LLL cavitary lesion: currently on piperacillin-tazobactam with clinical improvement noted. Vancomycin since discontinued 03/05. MRSA amplification nares 03/03 negative.   -Continue piperacillin-tazobactam for now   -Continue to follow sputum cultures  -Follow TB AG t-cell stimulation  -Follow Aspergillus antigen  -Airborne precautions discontinued    3. Right eye HSV lesion: reporting  photophobia and erythema right eye on evaluation. 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 Found to have dendritic lesions terminal bulbs nasally in right eye with no signs of retinitis. No ocular involvement left eye. Initiated on intravenous acyclovir.   -Continue IV acyclovir for now  -Continue ophthalmic drops and ointment per Ophthalmology  -Appreciate Ophthalmology recommendations     4. Oropharyngeal thrush  -Continue fluconazole 100 mg daily for 5-7 days     Patient seen and discussed with Dr. Marjory Lies.    Darl Pikes, MD  Fellow, Infectious Diseases

## 2019-11-20 NOTE — Plan of Care (Signed)
Patient seen & chart reviewed.     No acute events overnight. Doing well when seen this morning. SOB and right eye pain improving. Tolerating po intake.    Vitals:    11/19/19 2323 11/20/19 0356 11/20/19 0841 11/20/19 1110   BP: 94/62 97/64 113/72 139/72   BP Location: Left arm Left arm Left arm Left arm   Pulse: 80 79 80 105   Resp: 18 18 18 18    Temp: 36.2 C (97.2 F) 36.6 C (97.9 F) 36.8 C (98.2 F) 36.5 C (97.7 F)   TempSrc: Temporal Temporal Temporal Oral   SpO2: 97% 99% 100% 99%   Weight:         Exam:  General: Lying in bed, in NAD  HEENT: Erythema in right conjunctiva   Lungs: Regular rate, breath sounds clear  Heart: RRR, S1, S2  Abd: Soft, non-tender, non-distended  Ext: Moves all extremities.     Labs: reviewed    Recent Labs   Lab 11/20/19  0352 11/19/19  0134 11/18/19  1141 11/18/19  0100   WBC 3.4* 6.4  --  10.0*   Hemoglobin 11.7* 11.5* 12.2* 12.3*   Hematocrit 34* 34* 36* 36*   Platelets 237 192  --  170         Lab results: 11/20/19  0352 11/19/19  0134 11/18/19  0100   Sodium 139 134 132*   Potassium 3.9 3.2* 3.5   Chloride 109* 103 99   CO2 21 20 20    UN 4* 4* <4*   Creatinine 0.76 0.81 0.83   GFR,Caucasian 111 108 107   GFR,Black 128 125 124   Glucose 109* 149* 97   Calcium 8.8* 8.6* 8.5*       No results for input(s): INR in the last 168 hours.  Recent Labs     11/20/19  0352 11/19/19  0134 11/18/19  0100   AST 22 24 30    ALT 16 16 20    Bilirubin,Total 0.3 0.5 0.9     No components found with this basename: ALKPHOS      No results for input(s): PGLU in the last 168 hours.    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 in setting of HIV/AIDS  - Continue IV Acyclovir, started on 3/4  - Continue refresh and lubrifresh ggt     Schizophrenia/Depression/Anxiety   - Non-compliant with home medications x several months   - Prior home regimen: olanzapine15mg  nightly, mirtazepine 22.5mg  nightly   - Restart mirtazapine at 7.5mg  nightly on 3/7  - Per Pharmacy, rec not restart olanzapine if pt is non-compliant with follow up     DVT prophylaxis: IPCs  Dispo: Defer pending med stability     Derek Faucett B Lilli Dewald, NP  2:12 PM  11/20/2019    Medications Prior to Admission   Medication Sig    acetaminophen (TYLENOL) 500 mg tablet Take 500  mg by mouth every 6 hours as needed for Pain    omeprazole (PRILOSEC) 40 MG capsule Take 1 capsule (40 mg total) by mouth daily    ammonium lactate (LAC-HYDRIN) 12 % lotion Apply topically 2 times daily as needed for Dry Skin    naltrexone (DEPADE) 50 MG tablet Take 1 tablet (50 mg total) by mouth daily    OLANZapine (ZYPREXA) 5 MG tablet Take 1 tablet (5 mg total) by mouth daily    OLANZapine (ZYPREXA) 10 MG tablet Take 1 tablet (10 mg total) by mouth nightly TDD 15mg     efavirenz-emtrictabine-tenofovir (ATRIPLA) 600-200-300 MG per tablet Take 1 tablet by mouth nightly on an empty stomach    sulfamethoxazole-trimethoprim (BACTRIM DS,SEPTRA DS) 800-160 MG per tablet Take 1 tablet by mouth daily    gabapentin (GABAPENTIN) 100MG  capsule Take 1 capsule (100 mg total) by mouth 3 times daily    mirtazapine (REMERON) 7.5 MG tablet Take 1 tablet (7.5 mg total) by mouth nightly    mirtazapine (REMERON) 15 MG tablet Take 1 tablet (15 mg total) by mouth nightly    nicotine polacrilex (NICORETTE) 4 MG gum Take 1 each (4 mg total) by mouth as needed for Smoking cessation Max daily dose: 24 mg     Scheduled Meds:   mirtazapine  7.5 mg Oral Nightly    lidocaine  2 patch Transdermal Q24H    acyclovir  10 mg/kg Intravenous Q8H    carboxymethylcellulose  1 drop Both  Eyes 4x Daily    ARTIFICIAL TEARS   Both Eyes Nightly    pantoprazole  40 mg Oral BID AC    bictegravir-emtricitabine-tenofovir  1 tablet Oral Daily    piperacillin-tazobactam  4.5 g Intravenous Q8H    sulfamethoxazole-trimethoprim  1 tablet Oral Daily    fluconazole  100 mg Oral Daily    protein supplement  30 mL Oral BID WC    Vitamins - senior  1 tablet Oral Daily    thiamine  100 mg Oral Daily    folic acid  1 mg Oral Daily

## 2019-11-20 NOTE — Plan of Care (Signed)
Problem: Safety  Goal: Patient will remain free of falls  Outcome: Maintaining     Problem: Pain/Comfort  Goal: Patient's pain or discomfort is manageable  Outcome: Maintaining     Problem: Nutrition  Goal: Patient's nutritional status is maintained or improved  Outcome: Maintaining     Problem: Psychosocial  Goal: Demonstrates ability to cope with illness  Outcome: Maintaining

## 2019-11-21 LAB — CBC AND DIFFERENTIAL
Baso # K/uL: 0 10*3/uL (ref 0.0–0.1)
Basophil %: 0.8 %
Eos # K/uL: 0.1 10*3/uL (ref 0.0–0.5)
Eosinophil %: 3.8 %
Hematocrit: 36 % — ABNORMAL LOW (ref 40–51)
Hemoglobin: 11.5 g/dL — ABNORMAL LOW (ref 13.7–17.5)
IMM Granulocytes #: 0 10*3/uL (ref 0.0–0.0)
IMM Granulocytes: 0.8 %
Lymph # K/uL: 1 10*3/uL — ABNORMAL LOW (ref 1.3–3.6)
Lymphocyte %: 25.7 %
MCH: 33 pg — ABNORMAL HIGH (ref 26–32)
MCHC: 32 g/dL (ref 32–37)
MCV: 101 fL — ABNORMAL HIGH (ref 79–92)
Mono # K/uL: 0.8 10*3/uL (ref 0.3–0.8)
Monocyte %: 21.7 %
Neut # K/uL: 1.7 10*3/uL — ABNORMAL LOW (ref 1.8–5.4)
Nucl RBC # K/uL: 0 10*3/uL (ref 0.0–0.0)
Nucl RBC %: 0 /100 WBC (ref 0.0–0.2)
Platelets: 285 10*3/uL (ref 150–330)
RBC: 3.5 MIL/uL — ABNORMAL LOW (ref 4.6–6.1)
RDW: 11.9 % (ref 11.6–14.4)
Seg Neut %: 47.2 %
WBC: 3.7 10*3/uL — ABNORMAL LOW (ref 4.2–9.1)

## 2019-11-21 LAB — COMPREHENSIVE METABOLIC PANEL
ALT: 18 U/L (ref 0–50)
AST: 22 U/L (ref 0–50)
Albumin: 3.2 g/dL — ABNORMAL LOW (ref 3.5–5.2)
Alk Phos: 56 U/L (ref 40–130)
Anion Gap: 10 (ref 7–16)
Bilirubin,Total: 0.2 mg/dL (ref 0.0–1.2)
CO2: 20 mmol/L (ref 20–28)
Calcium: 9.1 mg/dL (ref 9.0–10.3)
Chloride: 107 mmol/L (ref 96–108)
Creatinine: 0.78 mg/dL (ref 0.67–1.17)
GFR,Black: 127 *
GFR,Caucasian: 110 *
Glucose: 97 mg/dL (ref 60–99)
Lab: 5 mg/dL — ABNORMAL LOW (ref 6–20)
Potassium: 3.8 mmol/L (ref 3.3–5.1)
Sodium: 137 mmol/L (ref 133–145)
Total Protein: 6.6 g/dL (ref 6.3–7.7)

## 2019-11-21 LAB — PHOSPHORUS: Phosphorus: 4.4 mg/dL (ref 2.7–4.5)

## 2019-11-21 LAB — MAGNESIUM: Magnesium: 2.2 mg/dL (ref 1.6–2.5)

## 2019-11-21 NOTE — Progress Notes (Signed)
[facility] APP Daily Progress Note    Patient seen and chart, including relevant labs and imaging, reviewed.  Derek Walker feels well, sitting up in chair and watching movie on his phone.  Has not been walking around much but will try to increase activity.  Eating well.    Temp:  [36.2 C (97.2 F)-37.2 C (99 F)] 36.6 C (97.9 F)  Heart Rate:  [72-103] 103  Resp:  [18-20] 20  BP: (96-117)/(62-80) 103/62    Exam:   General: OOB to chair, in NAD  Lungs: Regular rate, breath sounds clear  Heart: RRR, S1, S2  Abd: Soft, non-tender, non-distended  Ext: Moves all extremities.     Labs reviewed.    Recent Labs   Lab 11/21/19  0423 11/20/19  0352 11/19/19  0134   WBC 3.7* 3.4* 6.4   Hemoglobin 11.5* 11.7* 11.5*   Hematocrit 36* 34* 34*   Platelets 285 237 192         Lab results: 11/21/19  0423 11/20/19  0352 11/19/19  0134   Sodium 137 139 134   Potassium 3.8 3.9 3.2*   Chloride 107 109* 103   CO2 20 21 20    UN 5* 4* 4*   Creatinine 0.78 0.76 0.81   GFR,Caucasian 110 111 108   GFR,Black 127 128 125   Glucose 97 109* 149*   Calcium 9.1 8.8* 8.6*       Recent Labs   Lab 11/21/19  0423 11/20/19  0352 11/19/19  0134   ALT 18 16 16    AST 22 22 24      No components found with this basename: ALKPHOS, BILITOT, BILIDIR, PROT, LABALBU    No results for input(s): INR in the last 168 hours.    No results for input(s): PGLU in the last 168 hours.    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      Trellis Paganini, NP  5:52 PM  11/21/2019     mirtazapine  7.5 mg Oral Nightly    lidocaine  2 patch Transdermal Q24H    acyclovir  10 mg/kg Intravenous Q8H    carboxymethylcellulose  1 drop Both Eyes 4x Daily    ARTIFICIAL TEARS   Both Eyes Nightly    pantoprazole  40 mg Oral BID AC    bictegravir-emtricitabine-tenofovir  1 tablet Oral Daily    piperacillin-tazobactam  4.5 g Intravenous Q8H  sulfamethoxazole-trimethoprim  1 tablet Oral Daily    fluconazole  100 mg Oral Daily    protein supplement  30 mL Oral BID WC    Vitamins - senior  1 tablet Oral Daily    thiamine  100 mg Oral Daily    folic acid  1 mg Oral Daily

## 2019-11-21 NOTE — Plan of Care (Signed)
Problem: Pain/Comfort  Goal: Patient's pain or discomfort is manageable  Outcome: Maintaining   Pt states pain is well controlled with lidocaine patches.  Problem: Nutrition  Goal: Patient's nutritional status is maintained or improved  Outcome: Maintaining     Problem: Psychosocial  Goal: Demonstrates ability to cope with illness  Outcome: Maintaining     Problem: Infection  Goal: Signs and symptoms of infections are decreased or avoided  Outcome: Maintaining

## 2019-11-21 NOTE — Progress Notes (Signed)
Infectious Diseases Progress/Follow-Up Note    Subjective: Patient seen and examined in PM. Patient continues to appear comfortable. Reports shortness of breath has resolved. Continues with dry cough. Also notes improvement in terms of right eye vision. Denies nausea, emesis, abdominal pain, diarrhea, rash.      Objective: No significant overnight events. Tmax 37.2 degrees Celsius past 24 hours.     Physical Exam:  BP: (96-117)/(62-80)   Temp:  [36.2 C (97.2 F)-37.2 C (99 F)]   Temp src: Temporal (03/07 1434)  Heart Rate:  [72-103]   Resp:  [18-20]   SpO2:  [93 %-100 %]   General Appearance: Male patient of stated age, resting comfortably in chair, in no acute distress.    HEENT: Normocephalic, atraumatic head. Mild conjunctival erythema noted right eye. Wearing mask. Neck supple.   Cardiovascular: Regular rate and rhythm. No murmurs, rubs, or gallops.   Pulmonary: Breathing comfortably on ambient air. Bilateral breath sounds clear to auscultation. No wheezing, rales, or rhonchi.   Abdomen: Soft, nondistended, nontender. Bowel sounds normoactive.   Extremities: No edema.    Neurologic: Grossly, no focal neurologic deficits noted.  Mood: Appropriate.     Laboratory, Imaging, Microbiology Data:  Recent Labs   Lab 11/21/19  0423 11/20/19  0352 11/19/19  0134   WBC 3.7* 3.4* 6.4   Hemoglobin 11.5* 11.7* 11.5*   Hematocrit 36* 34* 34*   Platelets 285 237 192     Recent Labs   Lab 11/21/19  0423 11/20/19  0352 11/19/19  0134   Sodium 137 139 134   Potassium 3.8 3.9 3.2*   CO2 _0 UN 5* 4* 4*   Creatinine 0.78 0.76 0.81   Glucose 97 109* 149*   Calcium 9.1 8.8* 8.6*         Lab results: 11/21/19  0423 11/17/19  0503   Total Protein 6.6 7.7   Albumin 3.2* 3.8   ALT 18 29   AST 22 46   Alk Phos 56 65   Bilirubin,Total 0.2 0.8   Bilirubin,Direct  --  0.3     No results for input(s): CRP in the last 168 hours.     Lab Results   Component Value Date    ESR 19 (H) 09/24/2013     Microbiology Data:    Recent:    TB AG  t-cell stimulation 03/03L: in progress    Pneumocytis DNA PCR sputum 03/03: negative     AFB blood isolator culture 03/03: no growth to date    AFB stain 03/03, 03/04 x3: smear negative for AFB  AFB culture 03/03, 03/04: pending    Sputum 03/04:  Gram stain: Gram positive cocci in pairs and clusters  Culture: normal throat flora only    Aspergillus antigen sputum 03/05: in process    Antimicrobial Therapy:  -Vancomycin 03/03 - 03/05  -Piperacillin-tazobactam 03/03 - present  -Fluconazole 03/03 - present  -Acyclovir 03/04 - present    Prophylaxis:  -TMP-SMX 03/04 - present    Assessment and Plan/Recommendations: 44 year old male patient with past medical history significant for HIV/AIDS (most recent CD4 count 54 cells/uL, viral load 29, 600 copies/mL) with history of noncompliance, asthma, mood disorder, who presented 03/03 with history of fever, progressive dyspnea, and chest pain, and found to have left lower lobe cavitary lesion on imaging.     1. HIV/AIDS: most recent CD4 count 54 cells/uL, viral load 29, 600 copies/mL 03/03. Due to CD4 count, work-up for  opportunistic infection obtained. Cryptococcal antigen, Pneumocystis DNA PCR, AFB smear x3 negative and airborne precautions discontinued. Resumed on Biktarvy.  -Continue Biktarvy  -Follow resistance pattern   -Continue PJP prophylaxis with TMP-SMX  -Follow AFB isolator culture   -Airborne precautions discontinued    2. LLL cavitary lesion: currently on piperacillin-tazobactam with clinical improvement noted. Vancomycin since discontinued 03/05. MRSA amplification nares 03/03 negative.   -Continue piperacillin-tazobactam for now   -Continue to follow sputum cultures  -Follow TB AG t-cell stimulation  -Follow Aspergillus antigen  -Airborne precautions discontinued    3. Right eye HSV lesion: reporting photophobia and erythema right eye on evaluation. 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 Found to have dendritic lesions terminal  bulbs nasally in right eye with no signs of retinitis. No ocular involvement left eye. Initiated on intravenous acyclovir and noted to have improvement.   -Continue IV acyclovir for now  -Continue ophthalmic drops and ointment per Ophthalmology  -Appreciate Ophthalmology recommendations     4. Oropharyngeal thrush  -Continue fluconazole 100 mg daily for 5-7 days     Patient seen and discussed with Dr. Marjory Lies.    Darl Pikes, MD  Fellow, Infectious Diseases

## 2019-11-22 ENCOUNTER — Other Ambulatory Visit: Payer: Self-pay | Admitting: Cardiology

## 2019-11-22 DIAGNOSIS — A439 Nocardiosis, unspecified: Secondary | ICD-10-CM

## 2019-11-22 LAB — EKG 12-LEAD
P: 48 deg
PR: 171 ms
QRS: 40 deg
QRSD: 86 ms
QT: 397 ms
QTc: 461 ms
Rate: 81 {beats}/min
T: 47 deg

## 2019-11-22 LAB — COMPREHENSIVE METABOLIC PANEL
ALT: 17 U/L (ref 0–50)
AST: 23 U/L (ref 0–50)
Albumin: 3.3 g/dL — ABNORMAL LOW (ref 3.5–5.2)
Alk Phos: 57 U/L (ref 40–130)
Anion Gap: 12 (ref 7–16)
Bilirubin,Total: 0.2 mg/dL (ref 0.0–1.2)
CO2: 20 mmol/L (ref 20–28)
Calcium: 8.8 mg/dL — ABNORMAL LOW (ref 9.0–10.3)
Chloride: 107 mmol/L (ref 96–108)
Creatinine: 0.92 mg/dL (ref 0.67–1.17)
GFR,Black: 116 *
GFR,Caucasian: 101 *
Glucose: 114 mg/dL — ABNORMAL HIGH (ref 60–99)
Lab: 6 mg/dL (ref 6–20)
Potassium: 4.2 mmol/L (ref 3.3–5.1)
Sodium: 139 mmol/L (ref 133–145)
Total Protein: 6.5 g/dL (ref 6.3–7.7)

## 2019-11-22 LAB — BLOOD CULTURE
Bacterial Blood Culture: 0
Bacterial Blood Culture: 0

## 2019-11-22 LAB — CBC AND DIFFERENTIAL
Baso # K/uL: 0 10*3/uL (ref 0.0–0.1)
Basophil %: 0.5 %
Eos # K/uL: 0.2 10*3/uL (ref 0.0–0.5)
Eosinophil %: 4 %
Hematocrit: 36 % — ABNORMAL LOW (ref 40–51)
Hemoglobin: 11.8 g/dL — ABNORMAL LOW (ref 13.7–17.5)
IMM Granulocytes #: 0 10*3/uL (ref 0.0–0.0)
IMM Granulocytes: 0.8 %
Lymph # K/uL: 1 10*3/uL — ABNORMAL LOW (ref 1.3–3.6)
Lymphocyte %: 25.5 %
MCH: 34 pg — ABNORMAL HIGH (ref 26–32)
MCHC: 33 g/dL (ref 32–37)
MCV: 102 fL — ABNORMAL HIGH (ref 79–92)
Mono # K/uL: 0.8 10*3/uL (ref 0.3–0.8)
Monocyte %: 20.2 %
Neut # K/uL: 1.8 10*3/uL (ref 1.8–5.4)
Nucl RBC # K/uL: 0 10*3/uL (ref 0.0–0.0)
Nucl RBC %: 0 /100 WBC (ref 0.0–0.2)
Platelets: 327 10*3/uL (ref 150–330)
RBC: 3.5 MIL/uL — ABNORMAL LOW (ref 4.6–6.1)
RDW: 12.2 % (ref 11.6–14.4)
Seg Neut %: 49 %
WBC: 3.7 10*3/uL — ABNORMAL LOW (ref 4.2–9.1)

## 2019-11-22 LAB — TB AG T-CELL STIMULATION: TB Ag T-Cell Stimulation: 0

## 2019-11-22 LAB — MAGNESIUM: Magnesium: 2.1 mg/dL (ref 1.6–2.5)

## 2019-11-22 LAB — PHOSPHORUS: Phosphorus: 4.3 mg/dL (ref 2.7–4.5)

## 2019-11-22 MED ORDER — MIRTAZAPINE 15 MG PO TABS *I*
15.0000 mg | ORAL_TABLET | Freq: Every evening | ORAL | Status: DC
Start: 2019-11-22 — End: 2019-11-23
  Administered 2019-11-22: 15 mg via ORAL
  Filled 2019-11-22 (×2): qty 1

## 2019-11-22 MED ORDER — SULFAMETHOXAZOLE-TRIMETHOPRIM 800-160 MG PO TABS *I*
2.0000 | ORAL_TABLET | Freq: Three times a day (TID) | ORAL | Status: DC
Start: 2019-11-22 — End: 2019-11-23
  Administered 2019-11-22 – 2019-11-23 (×3): 2 via ORAL
  Filled 2019-11-22 (×5): qty 2

## 2019-11-22 MED ORDER — MELATONIN 3 MG PO TABS *I*
3.0000 mg | ORAL_TABLET | Freq: Every day | ORAL | Status: DC
Start: 2019-11-22 — End: 2019-11-23
  Administered 2019-11-22: 3 mg via ORAL
  Filled 2019-11-22: qty 1

## 2019-11-22 MED ORDER — VALACYCLOVIR HCL 500 MG PO TABS *I*
1000.0000 mg | ORAL_TABLET | Freq: Three times a day (TID) | ORAL | Status: DC
Start: 2019-11-22 — End: 2019-11-23
  Administered 2019-11-22 – 2019-11-23 (×3): 1000 mg via ORAL
  Filled 2019-11-22 (×5): qty 2
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
## 2019-11-22 NOTE — Plan of Care (Signed)
Problem: Pain/Comfort  Goal: Patient's pain or discomfort is manageable  Outcome: Maintaining   Patient encouraged to change positions frequently while in bed.  Problem: Nutrition  Goal: Patient's nutritional status is maintained or improved  Outcome: Maintaining     Problem: Psychosocial  Goal: Demonstrates ability to cope with illness  Outcome: Maintaining     Problem: Infection  Goal: Signs and symptoms of infections are decreased or avoided  Outcome: Maintaining

## 2019-11-22 NOTE — Progress Notes (Signed)
HMD APP Daily Progress Note    Patient seen and chart, including relevant labs and imaging, reviewed.    Wife updated at bedside.   Derek Walker denies shortness of breath or chest pain.    He reports he has been irritable with other people and more stressed than usual.  Used to be connected to counselor through [employer] but stopped going about a year ago d/t pandemic and other stressors.  Says he has been taking his medications but falls out of his routine sometimes.  Reports poor sleep; while in the hospital has been staying up until 3-4 AM and sometimes all night.  Took melatonin last night, which helped him.  Wants to be back on his home psych meds.  We discussed increasing the mirtazapine but that he would need to demonstrate that he could attend follow up appointments before olanzapine could be resumed.  He verbalized understanding of and agreement with this plan.    Temp:  [36.3 C (97.3 F)-36.9 C (98.4 F)] 36.3 C (97.3 F)  Heart Rate:  [83-97] 85  Resp:  [18-20] 18  BP: (95-111)/(68-74) 104/74    Exam:   General: OOB to chair, in NAD  Lungs: Regular rate, breath sounds clear  Heart: RRR, S1, S2  Abd: Soft, non-tender, non-distended  Ext: Moves all extremities.     Labs reviewed.    Recent Labs   Lab 11/22/19  0114 11/21/19  0423 11/20/19  0352   WBC 3.7* 3.7* 3.4*   Hemoglobin 11.8* 11.5* 11.7*   Hematocrit 36* 36* 34*   Platelets 327 285 237         Lab results: 11/22/19  0114 11/21/19  0423 11/20/19  0352   Sodium 139 137 139   Potassium 4.2 3.8 3.9   Chloride 107 107 109*   CO2 20 20 21    UN 6 5* 4*   Creatinine 0.92 0.78 0.76   GFR,Caucasian 101 110 111   GFR,Black 116 127 128   Glucose 114* 97 109*   Calcium 8.8* 9.1 8.8*       Recent Labs   Lab 11/22/19  0114 11/21/19  0423 11/20/19  0352   ALT 17 18 16    AST 23 22 22      No components found with this basename: ALKPHOS, BILITOT, BILIDIR, PROT, LABALBU    No results for input(s): INR in the last 168 hours.    No results for input(s): PGLU in the last  168 hours.    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      Lorn Junes, NP  4:36 PM  11/22/2019     sulfamethoxazole-trimethoprim  2 tablet Oral TID    valACYclovir  1,000 mg Oral TID    mirtazapine  15 mg Oral Nightly    melatonin  3 mg Oral Daily @ 1800    lidocaine  2 patch Transdermal Q24H    carboxymethylcellulose  1 drop Both Eyes 4x Daily    ARTIFICIAL TEARS   Both Eyes Nightly    pantoprazole  40 mg Oral BID AC    bictegravir-emtricitabine-tenofovir  1 tablet Oral Daily    fluconazole  100 mg Oral Daily    protein supplement  30 mL Oral BID WC    Vitamins - senior  1 tablet Oral Daily    thiamine  100 mg Oral Daily    folic acid  1 mg Oral Daily

## 2019-11-22 NOTE — Consults (Signed)
Medical Nutrition Therapy- Follow Up    Current nutrition prescription: Easy to Chew (level 7 EC). Ensure Clear QID; ProSource 30 mL BID    Access: n/a    Patient Summary: Per chart, Patient is a 44 yo male with PMH HIV (not currently on HAART), GERD, asthma, anxiety, schizophrenia, depression and ETOH abuse who presents with c/o chest pain and shortness of breath. Found to be septic on admission with LLL cavitary lung lesion.      Anthropometrics:   Height: 180.3 (5\' 11" )   Weight: 70.3 kg (155 lb) - est admit 3/3  IBW: 78.2 kg + 10%  BMI: 21.62 kg/m2         Wt change: No actual wt taken since admission.  WEIGHTS Weight (metric) Weight (standard) Weight Method   11/17/2019 70.308 kg 155 lbs Estimated   01/14/2019 79.379 kg 175 lbs --   02/20/2018 85.276 kg 188 lbs --         Estimated Nutrient Needs: (Based on IBW 78.2 kg)    1955 - 2345 kcal/day (25  - 30 kcal/kg)   94 - 117 g protein/day (1.2 - 1.5 g/kg)    1955 - 2345 mL fluid/day (25 - 30 mL/kg)    Nutrition Assessment and Diagnosis:   Chart reviewed; labs, MAR, and I/O's reviewed.    -Ca 8.8 (L); Noted Hct and Hgb are low with elevated MCV suggestive of folate and/or vitamin B12 deficiency.   -Meds include Folic Acid, Remeron, Centrum Silver, Pantoprazole, Zosyn, Bactrim, Thiamine     Pt ordered for Easy to 04/22/2018 w/ ProSource 30 mL BID and Ensure Clear QID. Pt taking 100% of ProSource per MAR (provides 120 kcal/d and 30 g protein/d). Food and nutrient intakes not documented on. Per chart, pt eating well. Visited pt this afternoon - he reports great appetite and intakes eating all of every meal and drinking at least 3 Ensure Clear daily. Denies n/v/d/c. Would continue additional supplement support at this time given prior wt loss and poor PO. Of note, pt states he does not have any issues chewing or swallowing and does not know why he is on a down graded diet.     Malnutrition Status: Unclear malnutrition status at this time - pt w/ mild losses to muscle mass  assessed at this time. On admission pt noted w/ insufficient energy intake PTA and reported wt loss. No actual wt taken on admission to confirm wt loss. Pt eating well since admission.        Nutrition Intervention:   1. Assess for diet liberalization given pt report of tolerating all PO w/o issue.   2. Continue supplements as ordered.   3. Please document % meal/supplement intakes in RN flow sheets. RN assistance appreciated.   4. Suggest check Iron/Anemia profile w/ Folate and Vitamin B12 - supplement prn.   5. Please check actual weight and continue to weigh at least weekly to monitor trend.    Nutrition Monitoring/Evaluation:   1. Will monitor diet tolerance and intake, nutrition-related labs, weight trend, BM pattern, supplement acceptance.    2. Will follow up per moderate nutrition risk protocol.    [employer], RD  Clinical Dietitian   [facility] # [phone]

## 2019-11-23 ENCOUNTER — Other Ambulatory Visit: Payer: Self-pay

## 2019-11-23 LAB — COMPREHENSIVE METABOLIC PANEL
ALT: 15 U/L (ref 0–50)
AST: 21 U/L (ref 0–50)
Albumin: 3.3 g/dL — ABNORMAL LOW (ref 3.5–5.2)
Alk Phos: 55 U/L (ref 40–130)
Anion Gap: 12 (ref 7–16)
Bilirubin,Total: <0.2 mg/dL (ref 0.0–1.2)
CO2: 18 mmol/L — ABNORMAL LOW (ref 20–28)
Calcium: 8.9 mg/dL — ABNORMAL LOW (ref 9.0–10.3)
Chloride: 108 mmol/L (ref 96–108)
Creatinine: 0.85 mg/dL (ref 0.67–1.17)
GFR,Black: 122 *
GFR,Caucasian: 106 *
Glucose: 101 mg/dL — ABNORMAL HIGH (ref 60–99)
Lab: <4 mg/dL — ABNORMAL LOW (ref 6–20)
Potassium: 3.9 mmol/L (ref 3.3–5.1)
Sodium: 138 mmol/L (ref 133–145)
Total Protein: 6.5 g/dL (ref 6.3–7.7)

## 2019-11-23 LAB — CBC AND DIFFERENTIAL
Baso # K/uL: 0 10*3/uL (ref 0.0–0.1)
Basophil %: 0.5 %
Eos # K/uL: 0.1 10*3/uL (ref 0.0–0.5)
Eosinophil %: 3.6 %
Hematocrit: 35 % — ABNORMAL LOW (ref 40–51)
Hemoglobin: 11.4 g/dL — ABNORMAL LOW (ref 13.7–17.5)
IMM Granulocytes #: 0 10*3/uL (ref 0.0–0.0)
IMM Granulocytes: 1 %
Lymph # K/uL: 1 10*3/uL — ABNORMAL LOW (ref 1.3–3.6)
Lymphocyte %: 24.9 %
MCH: 33 pg — ABNORMAL HIGH (ref 26–32)
MCHC: 32 g/dL (ref 32–37)
MCV: 101 fL — ABNORMAL HIGH (ref 79–92)
Mono # K/uL: 0.7 10*3/uL (ref 0.3–0.8)
Monocyte %: 18.8 %
Neut # K/uL: 2 10*3/uL (ref 1.8–5.4)
Nucl RBC # K/uL: 0 10*3/uL (ref 0.0–0.0)
Nucl RBC %: 0 /100 WBC (ref 0.0–0.2)
Platelets: 383 10*3/uL — ABNORMAL HIGH (ref 150–330)
RBC: 3.5 MIL/uL — ABNORMAL LOW (ref 4.6–6.1)
RDW: 12.2 % (ref 11.6–14.4)
Seg Neut %: 51.2 %
WBC: 3.9 10*3/uL — ABNORMAL LOW (ref 4.2–9.1)

## 2019-11-23 MED ORDER — FOLIC ACID 1 MG PO TABS *I*
1.0000 mg | ORAL_TABLET | Freq: Every day | ORAL | 0 refills | Status: AC
Start: 2019-11-24 — End: 2019-12-24
  Filled 2019-11-23: qty 30, 30d supply, fill #0

## 2019-11-23 MED ORDER — VALACYCLOVIR HCL 500 MG PO TABS *I*
1000.0000 mg | ORAL_TABLET | Freq: Three times a day (TID) | ORAL | 0 refills | Status: DC
Start: 2019-11-23 — End: 2019-12-01
  Filled 2019-11-23: qty 48, 8d supply, fill #0

## 2019-11-23 MED ORDER — PANTOPRAZOLE SODIUM 40 MG PO TBEC *I*
40.0000 mg | DELAYED_RELEASE_TABLET | Freq: Two times a day (BID) | ORAL | 0 refills | Status: DC
Start: 2019-11-23 — End: 2020-01-12
  Filled 2019-11-23: qty 60, 30d supply, fill #0

## 2019-11-23 MED ORDER — THIAMINE MONONITRATE 100 MG PO TABS *I*
100.0000 mg | ORAL_TABLET | Freq: Every day | ORAL | 0 refills | Status: AC
Start: 2019-11-24 — End: 2019-12-24
  Filled 2019-11-23: qty 30, 30d supply, fill #0

## 2019-11-23 MED ORDER — MULTIVITAMIN ADULTS 50+ PO TABS *I*
1.0000 | ORAL_TABLET | Freq: Every day | ORAL | 0 refills | Status: AC
Start: 2019-11-24 — End: 2019-12-24
  Filled 2019-11-23: qty 30, 30d supply, fill #0

## 2019-11-23 MED ORDER — VALACYCLOVIR HCL 500 MG PO TABS *I*
1000.0000 mg | ORAL_TABLET | Freq: Three times a day (TID) | ORAL | 0 refills | Status: DC
Start: 2019-11-23 — End: 2019-11-23
  Filled 2019-11-23: qty 78, 13d supply, fill #0

## 2019-11-23 MED ORDER — MIRTAZAPINE 15 MG PO TABS *I*
15.0000 mg | ORAL_TABLET | Freq: Every evening | ORAL | 0 refills | Status: AC
Start: 2019-11-23 — End: 2019-12-23
  Filled 2019-11-23: qty 30, 30d supply, fill #0

## 2019-11-23 MED ORDER — CARBOXYMETHYLCELLULOSE SODIUM 0.5 % OP SOLN WRAPPED *I*
1.0000 [drp] | Freq: Four times a day (QID) | OPHTHALMIC | 0 refills | Status: DC
Start: 2019-11-23 — End: 2019-12-01
  Filled 2019-11-23: qty 15, 30d supply, fill #0

## 2019-11-23 MED ORDER — BEN GAY GREASELESS EX *WRAPPED*
TOPICAL_CREAM | Freq: Four times a day (QID) | CUTANEOUS | 0 refills | Status: AC | PRN
Start: 2019-11-23 — End: ?
  Filled 2019-11-23: qty 85, 20d supply, fill #0

## 2019-11-23 MED ORDER — SULFAMETHOXAZOLE-TRIMETHOPRIM 800-160 MG PO TABS *I*
2.0000 | ORAL_TABLET | Freq: Three times a day (TID) | ORAL | 0 refills | Status: AC
Start: 2019-11-23 — End: 2019-12-23
  Filled 2019-11-23: qty 180, 30d supply, fill #0

## 2019-11-23 MED ORDER — MELATONIN 3 MG PO TABS *I*
3.0000 mg | ORAL_TABLET | Freq: Every evening | ORAL | 0 refills | Status: AC | PRN
Start: 2019-11-23 — End: 2019-12-23
  Filled 2019-11-23: qty 30, 30d supply, fill #0

## 2019-11-23 MED ORDER — BICTEGRAVIR-EMTRICITAB-TENOFOV 50-200-25 MG PO TABS *I*
1.0000 | ORAL_TABLET | Freq: Every day | ORAL | 0 refills | Status: DC
Start: 2019-11-24 — End: 2019-12-14
  Filled 2019-11-23: qty 30, 30d supply, fill #0

## 2019-11-23 NOTE — Plan of Care (Signed)
Problem: Pain/Comfort  Goal: Patient's pain or discomfort is manageable  Outcome: Maintaining   Patient encouraged to change positions frequently while in bed.  Problem: Nutrition  Goal: Patient's nutritional status is maintained or improved  Outcome: Maintaining     Problem: Psychosocial  Goal: Demonstrates ability to cope with illness  Outcome: Maintaining     Problem: Infection  Goal: Signs and symptoms of infections are decreased or avoided  Outcome: Maintaining

## 2019-11-23 NOTE — Plan of Care (Signed)
Plan of care discussed with attending physician and ID Dr. Magnus Ivan. Patient ready for discharge to home with spouse. Will continue acyclovir for total of 14 day course, continue taking ART and high dose Bactrim follow up outpatient ID clinic. Has an appointment with [facility] as he wishes to restart psychiatric medications. Will also follow up with [facility].    Patient aware of and agreeable to discharge today. Feeling better.

## 2019-11-23 NOTE — Continuity of Care (Addendum)
Care Coordinator Transitions Discharge Note: Contact 2       Hospital Admission Date/Discharge Date: 11/17/2019 To 11/23/2019     Discharge Diagnosis at the time of discharge:    1. Fever, unspecified fever cause    2. HIV disease         Hospital Area Discharged From: [facility])     Discharge Disposition:  Home or Self Care    Pending Labs at Discharge:     PENDING MICRO RESULTS: Follow up micro from 3/3, 3/4      Course of Hospital Stay: CONCISE NARRATIVE: 44 year old male with PMH of psychotic depression HIV/AIDSdiagnosed in 2013not compliant with HAART), alcohol abuse,andasthma whopresented to the emergency department (3/3) with chest pain andprogressiveshortness of breath. Found to be feverish. CT showing a cavitary lesion left lower lung but all AFBs negative but positive for Nocardia. CD4 count found to be 54.Infectious Disease consulted and recommended restarting antiviral therapy, completing a 14 day course of Acyclovir, and high dose Bactrim PO until follow up with ID. Ready to discharge to home on PO antiviral therapy and PO anx therapy. OPAT complete.     Derek Walker was previously connected to [employer] but discharged last year, he expressed interest in resuming zyprexa and counseling. A new appointment with [facility] is scheduled.     Medications: New, Changed, or Stopped:     Medication List      START taking these medications    Biktarvy 50-200-25 MG per tablet  Generic drug: bictegravir-emtricitabine-tenofovir  Take 1 tablet by mouth daily  Start taking on: November 24, 2019     cerovite senior multivitamin tablet  Take 1 tablet by mouth daily  Start taking on: November 24, 2019     folic acid 1 MG tablet  Commonly known as: FOLVITE  Take 1 tablet (1 mg total) by mouth daily  Start taking on: November 24, 2019     melatonin 3 MG  Take 1 tablet (3 mg total) by mouth nightly as needed for Sleep     methyl salicylate-menthol 1-15 % topical  Commonly known as: BENGAY  Apply topically 4  times daily as needed (flank pain) to the following areas: Sides of abdomen/flanks     pantoprazole 40 MG EC tablet  Commonly known as: PROTONIX  Take 1 tablet (40 mg total) by mouth 2 times daily (before meals) Swallow whole. Do not crush, break, or chew.     Refresh Tears 0.5 % ophthalmic solution  Generic drug: carboxymethylcellulose  Place 1 drop into both eyes 4 times daily     Thiamine Mononitrate 100 MG tablet  Take 1 tablet (100 mg total) by mouth daily  Start taking on: November 24, 2019     valACYclovir 500 MG tablet  Commonly known as: VALTREX  Take 2 tablets (1,000 mg total) by mouth 3 times daily for 8 days        CHANGE how you take these medications    mirtazapine 15 MG tablet  Commonly known as: REMERON  Take 1 tablet (15 mg total) by mouth nightly  What changed:    medication strength   how much to take   Another medication with the same name was removed. Continue taking this medication, and follow the directions you see here.     sulfamethoxazole-trimethoprim 800-160 MG per tablet  Commonly known as: BACTRIM DS,SEPTRA DS  Take 2 tablets by mouth 3 times daily for 30 days  What changed:  how much to take   when to take this        STOP taking these medications    acetaminophen 500 mg tablet  Commonly known as: TYLENOL     ammonium lactate 12 % lotion  Commonly known as: LAC-HYDRIN     efavirenz-emtrictabine-tenofovir 600-200-300 MG per tablet  Commonly known as: ATRIPLA     gabapentin 100MG  capsule  Commonly known as: NEURONTIN     naltrexone 50 MG tablet  Commonly known as: DEPADE     nicotine polacrilex 4 MG gum  Commonly known as: NICORETTE     OLANZapine 10 MG tablet  Commonly known as: ZyPREXA     OLANZapine 5 MG tablet  Commonly known as: ZyPREXA     omeprazole 40 MG capsule  Commonly known as: PriLOSEC           Where to Get Your Medications      These medications were sent to [facility]  [address], [place]    Hours: [phone] Phone:  [phone]    Biktarvy 50-200-25 MG per tablet   cerovite senior multivitamin tablet   folic acid 1 MG tablet   melatonin 3 MG   methyl salicylate-menthol 1-15 % topical   mirtazapine 15 MG tablet   pantoprazole 40 MG EC tablet   Refresh Tears 0.5 % ophthalmic solution   sulfamethoxazole-trimethoprim 800-160 MG per tablet   Thiamine Mononitrate 100 MG tablet   valACYclovir 500 MG tablet         Future Appointments        Nov 25 2019   02:30 PM - FOLLOW UP VISIT  [facility] [address] [place], DO           Nov 26 2019   09:30 AM - NEW PATIENT VISIT  [facility] - [facility], MD           Dec 08 2019   02:00 PM - FOLLOW UP VISIT  [facility] - [facility], NP           Dec 24 2019   09:30 AM - FOLLOW UP VISIT  [facility] - [facility], MD           Dec 24 2019   10:00 AM - NO CLINICAL DOCUMENTATION  [facility] - [facility], MD             Upcoming Appointments and To Do's:               Post Discharge Details for Care [facility] or ED Risk of Admission Score %: 68    Pharmacist reviewed discharge medication list for accuracy? Yes  If blank, no documentation available.     Pharmacist offered discharge education to patient or primary caregiver? Yes  If blank, no documentation available.     Patient Preferred Phone Number:  [phone]    Caregiver Name and Phone Number:        Discharge Address (if different than patient's home address):       If blank, no documentation available.     Home Care Agency and services provided:       If blank, no documentation available.     New Equipment Ordered this Admission: None   MUST indicate Other for Oxygen  of any form (travel tank, concentrator, etc)    New Visual merchandiser Name: n/a    Who is managing new anticoagulation therapy? N/A    Future Labs and Imaging Needed: If blank, no future lab or imaging orders placed at discharge.      Your To Do List (continued)     Future Labs/Procedures Complete By Expires    Notification of home antibiotics  As directed     Standing Orders Interval Expires    Basic metabolic panel  Once a week + PRN until 05/24/2020 05/24/2020    CBC and differential  Once a week + PRN until 05/24/2020 05/24/2020          Additional Comments: Pt would like to resume with [facility].  He will need a new referral since he has not been seen in a few years.

## 2019-11-23 NOTE — Plan of Care (Signed)
INFECTIOUS DISEASES PLAN OF CARE / SIGN OFF NOTE      Primary ID Diagnosis:   HIV/AIDS  Nocardia pneumonia    HOSPITAL COURSE:   44 year old male with PMH of psychotic depression HIV/AIDSdiagnosed in 2013not compliant with ART(CD4 197VL 41826/03/2018), alcohol abuse,andasthma whopresented to the emergency department (3/3) with chest pain andprogressiveshortness of breath.  Found to be feverish and hemodynamically stable, physical exam remarkable for thrush in your pharynx.  CT showing a cavitary lesion left lower lung. CD4 count found to be 54.  Sputum culture grew branching gram-positive bacilli consistent with nocardia so he was started on Bactrim.  Biktarvy was restarted.  Ginette Pitman was treated with 7 days of fluconazole.  Right thigh herpetic keratitis was treated with 5 days of IV acyclovir and plan to continue to complete 14 days with valacyclovir.    No Known Allergies (drug, envir, food or latex)      Discharge Antibiotics and Dose:   TMP-SMX 2 DS tablets 3 times daily    Antibiotic Start Date: 11/22/19  Anticipated Antibiotic Stop Date:   To be determined    Labs to be drawn after discharge: Weekly CBC with differential, BMP    When possible, weekly labs should be drawn on Monday or Tuesday    Fax/Route labs to attention of: ID Attending listed below, Laurin Coder PharmD, Jerrye Noble NP, Tiburcio Bash RN, fax [phone]  Outpatient ID Fellow: Lendon Collar    Outpatient ID Attending: Dr. Magnus Ivan  Outpatient PCP or Referring Physician: Suann Larry  Follow-up ID Appointment: 12/24/19  If this appointment needs to be rescheduled, please have the Unit or patient call [phone].    Please review this plan with the patient and consider giving the patient/family a copy of this form during your discharge teaching as well.     This plan is as of 11/23/19  7:59 AM  Any changes to this plan should be reflected in discharge summary and the discharge AVS. The outpatient ID Attending and Fellow should be notified as  well.    Please provide  information on the [facility] and [facility] Pharm[facility] that will be providing the IV antibiotics in the discharge AVS.    Beryle Flock, MD  ID Fellow
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
## 2019-11-23 NOTE — Progress Notes (Signed)
Patient discharged home via private vehicle by friend with all personal belongings and paperwork.     Patient discharge medication delivered to patient at bedside.    Patient verbalized understanding of paperwork with Clinical research associate.

## 2019-11-23 NOTE — Discharge Instructions (Signed)
Your diagnosis was: Lung Infection, oral fungal infection, Right Eye Pain due to HSV Keratitis (inflammatory condition that affects the cornea of your eye)    What was done: You underwent diagnostic labs and imaging. You were given IV antibiotics, IV anti-virals and oral anti-fungal medications. You were restarted on your HIV medications. Your chronic medical conditions were managed. You are medically stable for discharge.    What do I need to do:     - Follow up with your primary care provider as scheduled.    - Please take all of your medications as prescribed.     - Follow up with Infectious Disease clinic    - Follow up with [facility]    - Continue taking Acyclovir for total of 14 days. You can Stop taking on 3/17.    - Continue taking Bactrim until you are seen by Infectious Disease. They will instruct you further.     Recommended diet: Regular - No restrictions    Recommended activity: activity as tolerated    If you experience any of these symptoms 24 hours or more after discharge:Uncontrolled pain, Chest pain, Shortness of breath, Fever of 101 F. or greater, Chills, Poor appetite, Poor urinary output, Vomiting, Nausea, Diarrhea, Blood in stool or Weakness  Call Dr. Roxan Diesel.    If you experience any of these symptoms within the first 24 hours after discharge call Dr. Magnus Ivan at [phone]

## 2019-11-23 NOTE — Progress Notes (Signed)
[facility] SOCIAL WORK  PHARMACY FORM     Todays date:  November 23, 2019    Patient Name: Derek Walker      Medical Record #: [id number]   DOB: [birth year]  Patients Address: [address]                Social Worker: Cherie Dark, LMSW       Date of Service: November 23, 2019       Funding Source: [place] [employer]  ___________________________________________________________________    Pharmacy Information:  Date/time sent: November 23, 2019     Time needed: asap    Patient Location: Inpatient (specify unit)  [phone]    Medication Pick-up Preference: Please call ext. [phone] when ready    Pharmacy Contact: Lurena Joiner  Social work signature: Cherie Dark, LMSW  Supervisor/Manager Approval (if indicated):  Date:  (supervisor signature not required for Medicaid pending)

## 2019-11-23 NOTE — Discharge Summary (Signed)
Name: Derek Walker MRN: [id number] DOB: [birth year]     Admit Date: 11/17/2019   Date of Discharge: 11/23/2019     Patient was accepted for discharge to   Home or Self Care [1]           Discharge Attending Physician: Gregery Na, MD      Hospitalization Summary    CONCISE NARRATIVE: 44 year old male with PMH of psychotic depression HIV/AIDSdiagnosed in 2013not compliant with HAART), alcohol abuse,andasthma whopresented to the emergency department (3/3) with chest pain andprogressiveshortness of breath. Found to be feverish. CT showing a cavitary lesion left lower lung but all AFBs negative but positive for Nocardia. CD4 count found to be 54.Infectious Disease consulted and recommended restarting antiviral therapy, completing a 14 day course of Acyclovir, and high dose Bactrim PO until follow up with ID. Ready to discharge to home on PO antiviral therapy and PO anx therapy. OPAT complete.     Derek Walker was previously connected to [facility] but discharged last year, he expressed interest in resuming zyprexa and counseling. A new appointment with [facility] is scheduled.           CT RESULTS:  CT head: Normal contrast-enhanced CT brain for the patient's age.  No areas of abnormal enhancement . No evidence of meningitis or intracranial abscess. Consider MRI if symptoms persist    CT CHEST: Large cavitary lesion with surrounding groundglass opacities in the left lower lobe. The leading differential in this likely immunocompromised patient is a pulmonary abscess/necrotizing pneumonia or aspiration pneumonia.    Mild, circumferential diffuse thickening of the esophagus, as may be seen with infectious or reflux-related esophagitis.                PENDING MICRO RESULTS: Follow up micro from 3/3, 3/4   SIGNIFICANT MED CHANGES: Yes  Mirtazapine changed to 15 mg  Stopped olanzapine d/t noncompliance   CONSULTANT SERVICE     Infectious Diseases     Ophthalmology     Nutrition             Signed: Honor Loh, NP  On: 11/23/2019   at: 2:32 PM

## 2019-11-24 ENCOUNTER — Telehealth: Payer: Self-pay

## 2019-11-24 NOTE — Telephone Encounter (Signed)
Leda Gauze, RN   Registered Nurse   Care Coordinator   Continuity of Care   Addendum   Date of Service:  11/23/2019 2:15 PM   Creation Time:  11/23/2019 6:41 PM                 Care Coordinator Transitions Discharge Note: Contact 2      Hospital Admission Date/Discharge Date: 11/17/2019 To 11/23/2019    Discharge Diagnosis at the time of discharge:    1. Fever, unspecified fever cause    2. HIV disease        Hospital Area Discharged From: [facility]    Discharge Disposition:  Home or Self Care    Pending Labs at Discharge:     PENDING MICRO RESULTS: Follow up micro from 3/3, 3/4     Course of Hospital Stay: CONCISE NARRATIVE: 44 year old male with PMH of psychotic depression HIV/AIDSdiagnosed in 2013not compliant with HAART), alcohol abuse,andasthma whopresented to the emergency department (3/3) with chest pain andprogressiveshortness of breath. Found to be feverish. CT showing a cavitary lesion left lower lung but all AFBs negative but positive for Nocardia. CD4 count found to be 54.Infectious Disease consulted and recommended restarting antiviral therapy, completing a 14 day course of Acyclovir, and high dose Bactrim PO until follow up with ID. Ready to discharge to home on PO antiviral therapy and PO anx therapy. OPAT complete.     Regan was previously connected to [facility] but discharged last year, he expressed interest in resuming zyprexa and counseling. A new appointment with [facility] is scheduled.     Medications: New, Changed, or Stopped:            Medication List             START taking these medications           Biktarvy 50-200-25 MG per tablet  Generic drug: bictegravir-emtricitabine-tenofovir  Take 1 tablet by mouth daily  Start taking on: November 24, 2019     cerovite senior multivitamin tablet  Take 1 tablet by mouth daily  Start taking on: November 24, 2019     folic acid 1 MG tablet  Commonly known as: FOLVITE  Take 1 tablet (1 mg total) by mouth daily  Start  taking on: November 24, 2019     melatonin 3 MG  Take 1 tablet (3 mg total) by mouth nightly as needed for Sleep     methyl salicylate-menthol 1-15 % topical  Commonly known as: BENGAY  Apply topically 4 times daily as needed (flank pain) to the following areas: Sides of abdomen/flanks     pantoprazole 40 MG EC tablet  Commonly known as: PROTONIX  Take 1 tablet (40 mg total) by mouth 2 times daily (before meals) Swallow whole. Do not crush, break, or chew.     Refresh Tears 0.5 % ophthalmic solution  Generic drug: carboxymethylcellulose  Place 1 drop into both eyes 4 times daily     Thiamine Mononitrate 100 MG tablet  Take 1 tablet (100 mg total) by mouth daily  Start taking on: November 24, 2019     valACYclovir 500 MG tablet  Commonly known as: VALTREX  Take 2 tablets (1,000 mg total) by mouth 3 times daily for 8 days               CHANGE how you take these medications           mirtazapine 15 MG tablet  Commonly  known as: REMERON  Take 1 tablet (15 mg total) by mouth nightly  What changed:    medication strength   how much to take   Another medication with the same name was removed. Continue taking this medication, and follow the directions you see here.     sulfamethoxazole-trimethoprim 800-160 MG per tablet  Commonly known as: BACTRIM DS,SEPTRA DS  Take 2 tablets by mouth 3 times daily for 30 days  What changed:    how much to take   when to take this                      STOP taking these medications           acetaminophen 500 mg tablet  Commonly known as: TYLENOL     ammonium lactate 12 % lotion  Commonly known as: LAC-HYDRIN     efavirenz-emtrictabine-tenofovir 600-200-300 MG per tablet  Commonly known as: ATRIPLA     gabapentin 100MG  capsule  Commonly known as: NEURONTIN     naltrexone 50 MG tablet  Commonly known as: DEPADE     nicotine polacrilex 4 MG gum  Commonly known as: NICORETTE     OLANZapine 10 MG tablet  Commonly known as: ZyPREXA     OLANZapine 5 MG tablet  Commonly known as:  ZyPREXA     omeprazole 40 MG capsule  Commonly known as: PriLOSEC                         Where to Get Your Medications             These medications were sent to [facility]  [address], [place]    Hours: [phone] Phone: [phone]    Biktarvy 50-200-25 MG per tablet   cerovite senior multivitamin tablet   folic acid 1 MG tablet   melatonin 3 MG   methyl salicylate-menthol 1-15 % topical   mirtazapine 15 MG tablet   pantoprazole 40 MG EC tablet   Refresh Tears 0.5 % ophthalmic solution   sulfamethoxazole-trimethoprim 800-160 MG per tablet   Thiamine Mononitrate 100 MG tablet   valACYclovir 500 MG tablet                 Future Appointments                Nov 25 2019   02:30 PM - FOLLOW UP VISIT  [facility] - [address] [place], DO            Nov 26 2019   09:30 AM - NEW PATIENT VISIT  [facility] - [facility], MD            Dec 08 2019   02:00 PM - FOLLOW UP VISIT  [facility] - [facility], NP            Dec 24 2019   09:30 AM - FOLLOW UP VISIT  [facility] - [facility], MD            Dec 24 2019   10:00 AM - NO CLINICAL DOCUMENTATION  [facility] - [facility], MD              Upcoming Appointments and To Do's:  Post Discharge Details for Care [facility] or ED Risk of Admission Score %: 68    Pharmacist reviewed discharge medication list for accuracy? Yes  If blank, no documentation available.     Pharmacist offered discharge education to patient or primary caregiver? Yes  If blank, no documentation available.     Patient Preferred Phone Number:  [phone]    Caregiver Name and Phone Number:     Discharge Address (if different than patient's home address):    If blank, no documentation available.     Home Care Agency and services provided:       If blank, no documentation  available.     New Equipment Ordered this Admission: None   MUST indicate Other for Oxygen of any form (travel tank, concentrator, etc)    New Equipment Vendor Name: n/a    Who is managing new anticoagulation therapy? N/A    Future Labs and Imaging Needed: If blank, no future lab or imaging orders placed at discharge.         Your To Do List (continued)     Future Labs/Procedures Complete By Expires    Notification of home antibiotics  As directed     Standing Orders Interval Expires    Basic metabolic panel  Once a week + PRN until 05/24/2020 05/24/2020    CBC and differential  Once a week + PRN until 05/24/2020 05/24/2020          Additional Comments: Pt would like to resume with [facility].  He will need a new referral since he has not been seen in a few years.        Electronically signed by Leda Gauze, RN at 11/23/2019 6:43 PM   Electronically signed by Leda Gauze, RN at 11/23/2019 6:44 PM               CONTACT 3: 24-48 Hour Post Discharge Care Manager Telephone [facility] or ED Risk of Admission %:   Risk of Admission or ED Visit  Current as of 6 hours ago      68% 40 - 100%: High Risk   20 - 40%: Medium Risk   0 - 20%: Low Risk     Last Change:         This score indicates an adult patient's 1-year risk, as a percentage, of a hospital admission or ED visit.             Current PCP:  Arnetha Courser, MD    Hospital Admissions:  1    ED Visits:  1    Has Medicaid:  Yes    Has Medicare:  No    In relationship:  No    Has Anemia:  No    Has asthma:  No    Has atrial fibrillation:  No    Has CVD:  No    Has chronic kidney disease:  No    Has Chronic Obstructive Pulmonary Disease:  No    Has Congestive Heart Failure:  No    Has Connective Tissue Disorder:  No    Has Depression:  Yes    Has Diabetes:  No    Has liver disease:  No    Has Peripheral Vascular Disease:  No              Spoke to: mother    Are you feeling as good as you did when you left the hospital? Yes  Has home care agency contacted  patient yet? : N/A    If equipment was ordered at time of discharge, does the patient have this in the home? N/A    Is the patient aware of pending orders / testing? yes    Do you have any questions about your discharge instructions? no    Do you have an appointment scheduled with your PCP and know the date and time of that appointment? yes    Do you have transportation to get to that appointment? yes    Do you have all of your medications listed on your discharge summary and are you taking them as they are written on the bottle? yes    Discharge medications reviewed against outpatient MEDICAL RECORD NUMBERyes    Do you have any questions or concerns about your medications? no    If the patient is newly initiated on anticoagulation, are they aware who is managing this? N/A    Reviewed medications with: mother    Spoke with patient's mother who reports he is doing well. He knows he has an appointment tomorrow and his mother going to be calling his wife to arrange transportation.     SUMMARY:    Time spent on the call with the patient = 5 min    Patient Care Management assessment and plan of care/next steps: TCM appointment with Dr. [place] 3/11.

## 2019-11-25 ENCOUNTER — Encounter: Payer: Self-pay | Admitting: Family Medicine

## 2019-11-25 ENCOUNTER — Ambulatory Visit: Payer: Medicaid Other | Attending: Family Medicine | Admitting: Family Medicine

## 2019-11-25 VITALS — BP 116/69 | HR 104 | Temp 96.4°F | Wt 160.4 lb

## 2019-11-25 DIAGNOSIS — B20 Human immunodeficiency virus [HIV] disease: Secondary | ICD-10-CM

## 2019-11-25 DIAGNOSIS — F17211 Nicotine dependence, cigarettes, in remission: Secondary | ICD-10-CM | POA: Insufficient documentation

## 2019-11-25 DIAGNOSIS — F3289 Other specified depressive episodes: Secondary | ICD-10-CM | POA: Insufficient documentation

## 2019-11-25 DIAGNOSIS — Z09 Encounter for follow-up examination after completed treatment for conditions other than malignant neoplasm: Secondary | ICD-10-CM | POA: Insufficient documentation

## 2019-11-25 DIAGNOSIS — J984 Other disorders of lung: Secondary | ICD-10-CM | POA: Insufficient documentation

## 2019-11-25 LAB — LEGIONELLA CULTURE: Legionella Culture: 0

## 2019-11-25 MED ORDER — NICOTINE 14 MG/24HR TD PT24 *I*
1.0000 | MEDICATED_PATCH | Freq: Every day | TRANSDERMAL | 1 refills | Status: DC
Start: 2019-11-25 — End: 2019-12-06

## 2019-11-25 MED ORDER — LIDOCAINE 5 % EX PTCH *I*
1.0000 | MEDICATED_PATCH | CUTANEOUS | 1 refills | Status: DC
Start: 2019-11-25 — End: 2019-12-06

## 2019-11-25 NOTE — Patient Instructions (Signed)
2 prescriptions have been sent to your [facility] pharmacy:  A nicotine patch that you can put on during the day, be sure to take it at night  Lidoderm patch to help with pain that you can put on your left flank.  Lets plan to follow-up in about a month, sooner if needed.  Be sure to make your infectious diseases appointment coming up.    Call if there are any issues.    Remember, the [facility] is open during the Coronavirus pandemic and we are here to help you either in person, via video (if possible) or over the phone.    If you have any health issues, please give [place] a call at [phone] or reach out through myChart.    Advice on Smoking Cessation  - It's important to choose a quit date to stop smoking. It can be next week, next month or three months from now, but choose the date and mark it on your calendar.  - If you have a mobile phone that accepts text messages, consider signing up for free text messages that will start on your quit date. On your computer or your phone, go to:   NailBuddies.ch or google "smoke free text messaging"  Enter your quit date and you will begin getting interactive text messages on your first day smoke free. You can stop getting the messages at any time by texting a code to the server.   - You can also visit www.quityes.org for tons of great resources to help smoking cessation  - As your quit date approaches, begin removing one to two cigarettes from your daily routine. That way, on the day before you quit, you will only be smoking a fraction of what you used to smoke.   - On the day before your quit date, throw away all of your ashtrays, lighters, matches and clean out your car.   - Begin to think of alternatives to the habits you have formed while smoking: if you smoke with your morning coffee, do something different like drive with your coffee in a mobile cup, stand in your front window and watch the birds in the yard or the traffic pass. Basically, change up your  routine.   - When other smokers ask you to join them for a cigarette, or bum a smoke, or borrow a lighter, say something like "I don't smoke," instead of "I'm trying to quit." Simple phrases that affirm your determination can make a big difference.  - Consider picking up some nicotine replacement gum and using it every 1.5 to 2 hours all day long. Remember not to chew the gum for more than 10-15 seconds. After softening it, park the gum between your cheek and gum and leave it there. Move it around your mouth with a few chews every 5 minutes or so and discard after 30-40 minutes.   - Try introducing moderate exercise daily: a 30 minute walk after dinner, joining a gym and starting with brisk treadmill walking, recumbent bike use and light weight lifting.   - Stay well hydrated with water and sugar free drinks. Avoid lots of caffeine.  - Try not to get too hungry, angry, lonely, or tired (HALT) because those feelings can make a cigarette more tempting  - Remember that a craving will pass within 5 minutes.   - Have snacks like carrot sticks, celery sticks or even small candy like Dum-Dums around to stick in your mouth instead of cigarettes.   - check  out the smoking cessation apps at the [employer] or [employer] store to help with cigarettes

## 2019-11-25 NOTE — Progress Notes (Signed)
[facility]      Patient: Derek Walker     Date: 11/30/2019   MR#: [id number]       DOB: [birth year]    Derek Walker is at the [facility] office today for transition of care management appointment.  A TCM nursing phone call occurred prior to today's visit.    HPI: Issues discussed today:    Recent hospitalization  Patient was hospitalized at [facility] from 11/17/2019 through 11/23/2019 for chest pain, shortness of breath and fever  While hospitalized, a CT of his chest demonstrated a cavitary lesion in the left lower lung.  All AFBs were negative, positive for Nocardia  While hospitalized, the patient CD4 count was noted to be 54.  Infectious disease consultation was completed and it was recommended that the patient be restarted on a retroviral medication for his HIV a completion of a 14-day course of acyclovir, high-dose Bactrim p.o. twice daily and close follow-up outpatient with infectious diseases.  Reviewed patient's cavitary lesion findings and treatment plan with him today.  He reports that he has been compliant with the medication.  Reports that his mother has picked up a pill organizer  Taking acyclovir and bactrim as directed.  Since discharge, patient continues to have pain in his left flank secondary to the cavitary lesion   requesting topical pain medication for that discomfort    HIV  Restarted BIKTARVY 20-500-25 mg daily while hospitalized  Has ID follow-up appointment on 3/24  Patient reports he feels "good."    Mood  Hx of seeing [facility], Brand Males, NP  Says he had been on Zyprexa in the past with good results  Declines starting Zyprexa today  He was started on mirtazapine 15 mg during recent hospitalization.  No SI, HI, AVH    Tobacco abuse  Patient continues to smoke  Recognizes that he should stop  Open to an RT    Imaging results from recent hospitalization:     CT RESULTS:  CT head: Normal contrast-enhanced CT brain for the patient's age.  No areas of abnormal enhancement . No  evidence of meningitis or intracranial abscess. Consider MRI if symptoms persist    CT CHEST: Large cavitary lesion with surrounding groundglass opacities in the left lower lobe. The leading differential in this likely immunocompromised patient is a pulmonary abscess/necrotizing pneumonia or aspiration pneumonia.    Mild, circumferential diffuse thickening of the esophagus, as may be seen with infectious or reflux-related esophagitis.      Review of Systems: General ROS: see HPI, otherwise negative. Denies HA, CP, SOB, GI discomfort, N/V/D, edema, rash, joint pain, vision changes, hearing changes, or change in mental status         Current Outpatient Medications:     mirtazapine (REMERON) 15 MG tablet, Take 1 tablet (15 mg total) by mouth nightly, Disp: 30 tablet, Rfl: 0    bictegravir-emtricitabine-tenofovir (BIKTARVY) 50-200-25 MG per tablet, Take 1 tablet by mouth daily, Disp: 30 tablet, Rfl: 0    methyl salicylate-menthol (BENGAY) 1-15 % topical, Apply topically 4 times daily as needed (flank pain) to the following areas: Sides of abdomen/flanks, Disp: 85 g, Rfl: 0    folic acid (FOLVITE) 1 MG tablet, Take 1 tablet (1 mg total) by mouth daily, Disp: 30 tablet, Rfl: 0    melatonin 3 MG, Take 1 tablet (3 mg total) by mouth nightly as needed for Sleep, Disp: 30 tablet, Rfl: 0    sulfamethoxazole-trimethoprim (BACTRIM DS,SEPTRA DS) 800-160 MG  per tablet, Take 2 tablets by mouth 3 times daily for 30 days, Disp: 180 tablet, Rfl: 0    Vitamins - senior (CENTRUM SILVER) TABS, Take 1 tablet by mouth daily, Disp: 30 tablet, Rfl: 0    carboxymethylcellulose (REFRESH PLUS) 0.5 % ophthalmic solution, Place 1 drop into both eyes 4 times daily, Disp: 15 mL, Rfl: 0    pantoprazole (PROTONIX) 40 MG EC tablet, Take 1 tablet (40 mg total) by mouth 2 times daily (before meals) Swallow whole. Do not crush, break, or chew., Disp: 60 tablet, Rfl: 0    Thiamine Mononitrate 100 MG tablet, Take 1 tablet (100 mg total) by  mouth daily, Disp: 30 tablet, Rfl: 0    valACYclovir (VALTREX) 500 MG tablet, Take 2 tablets (1,000 mg total) by mouth 3 times daily for 8 days, Disp: 48 tablet, Rfl: 0    lidocaine (LIDODERM) 5 % patch, Apply 1 patch onto the skin every 24 hours to the following areas: L flank. Remove and discard patch within 12 hours or as directed. (Patient not taking: Reported on 11/25/2019), Disp: 15 patch, Rfl: 1    nicotine (NICODERM CQ) 14 MG/24HR patch, Apply 1 patch onto the skin daily Remove & discard patch after 24 hours. (Patient not taking: Reported on 11/25/2019), Disp: 30 patch, Rfl: 1      OBJECTIVE    Vitals:    11/25/19 1513   BP: 116/69   Pulse: 104   Temp: 35.8 C (96.4 F)   Weight: 72.8 kg (160 lb 6.4 oz)        Physical Exam  Vitals and nursing note reviewed.   Constitutional:       General: He is not in acute distress.     Appearance: Normal appearance. He is normal weight. He is not ill-appearing, toxic-appearing or diaphoretic.   HENT:      Head: Normocephalic and atraumatic.      Right Ear: Tympanic membrane, ear canal and external ear normal.      Left Ear: Tympanic membrane, ear canal and external ear normal.      Nose: Nose normal. No congestion.      Mouth/Throat:      Mouth: Mucous membranes are moist.      Pharynx: No oropharyngeal exudate.   Eyes:      General: No scleral icterus.        Right eye: No discharge.         Left eye: No discharge.      Extraocular Movements: Extraocular movements intact.   Cardiovascular:      Rate and Rhythm: Tachycardia present.      Pulses: Normal pulses.      Comments: Mildly tachycardic at 102  Pulmonary:      Effort: Pulmonary effort is normal. No respiratory distress.      Comments: Satting at 99% on room air, breathing somewhat shallowly secondary to pain in the left flank, no conversational dyspnea, no wheezing appreciated, decreased breath sounds in left lower lobe  Abdominal:      General: Bowel sounds are normal. There is no distension.      Palpations:  Abdomen is soft.   Musculoskeletal:         General: No swelling or deformity. Normal range of motion.      Cervical back: Normal range of motion.   Skin:     General: Skin is warm.   Neurological:      General: No focal deficit present.  Mental Status: He is alert.   Psychiatric:      Comments: Friendly, open, future oriented, wants to get well          A/P: Derek Walker was seen today for follow-up. Diagnoses and all orders for this visit:    Hospital discharge follow-up  Cavitary lesion of lung  -     lidocaine (LIDODERM) 5 % patch; Apply 1 patch onto the skin every 24 hours to the following areas: L flank. Remove and discard patch within 12 hours or as directed. (Patient not taking: Reported on 11/25/2019)  HIV disease  Other depression  Nicotine dependence, cigarettes, in remission  -     nicotine (NICODERM CQ) 14 MG/24HR patch; Apply 1 patch onto the skin daily Remove & discard patch after 24 hours. (Patient not taking: Reported on 11/25/2019)  Patient improving after recent hospitalization for cavitary lesion, HIV  Reviewed patient's imaging, blood work, discharge summary from recent hospitalization  Following with infectious diseases appropriately.  Spent 3-5 minutes counseling pt and providing supportive psychotherapy and written materials regarding the importance of tobacco cessation.  Reports compliance with mirtazapine  Declines referral to [facility] at this time.  May need in the future  Close follow-up      Instructions given to patient:  Patient Instructions   2 prescriptions have been sent to your [facility] pharmacy:  A nicotine patch that you can put on during the day, be sure to take it at night  Lidoderm patch to help with pain that you can put on your left flank.  Lets plan to follow-up in about a month, sooner if needed.  Be sure to make your infectious diseases appointment coming up.    Call if there are any issues.    Remember, the [facility] is open during the Coronavirus pandemic and we are  here to help you either in person, via video (if possible) or over the phone.    If you have any health issues, please give [place] a call at [phone] or reach out through myChart.    Advice on Smoking Cessation  - It's important to choose a quit date to stop smoking. It can be next week, next month or three months from now, but choose the date and mark it on your calendar.  - If you have a mobile phone that accepts text messages, consider signing up for free text messages that will start on your quit date. On your computer or your phone, go to:   [email or web address] or google "smoke free text messaging"  Enter your quit date and you will begin getting interactive text messages on your first day smoke free. You can stop getting the messages at any time by texting a code to the server.   - You can also visit www.quityes.org for tons of great resources to help smoking cessation  - As your quit date approaches, begin removing one to two cigarettes from your daily routine. That way, on the day before you quit, you will only be smoking a fraction of what you used to smoke.   - On the day before your quit date, throw away all of your ashtrays, lighters, matches and clean out your car.   - Begin to think of alternatives to the habits you have formed while smoking: if you smoke with your morning coffee, do something different like drive with your coffee in a mobile cup, stand in your front window and watch the birds in the yard or the  traffic pass. Basically, change up your routine.   - When other smokers ask you to join them for a cigarette, or bum a smoke, or borrow a lighter, say something like "I don't smoke," instead of "I'm trying to quit." Simple phrases that affirm your determination can make a big difference.  - Consider picking up some nicotine replacement gum and using it every 1.5 to 2 hours all day long. Remember not to chew the gum for more than 10-15 seconds. After softening it, park the gum between  your cheek and gum and leave it there. Move it around your mouth with a few chews every 5 minutes or so and discard after 30-40 minutes.   - Try introducing moderate exercise daily: a 30 minute walk after dinner, joining a gym and starting with brisk treadmill walking, recumbent bike use and light weight lifting.   - Stay well hydrated with water and sugar free drinks. Avoid lots of caffeine.  - Try not to get too hungry, angry, lonely, or tired (HALT) because those feelings can make a cigarette more tempting  - Remember that a craving will pass within 5 minutes.   - Have snacks like carrot sticks, celery sticks or even small candy like Dum-Dums around to stick in your mouth instead of cigarettes.   - check out the smoking cessation apps at the [employer] or iTunes store to help with cigarettes            Return to the office in 4 weeks.      Spent 31 minutes engaged in physical exam, medication management/reconciliation, history and social evaluation with >50% of the time engaged in face-to-face examination and counseling with the patient.

## 2019-11-26 ENCOUNTER — Ambulatory Visit: Payer: Medicaid Other | Admitting: Student in an Organized Health Care Education/Training Program

## 2019-11-26 LAB — HIV-1 INTEGRASE INHIBITOR RESISTANCE BY SEQUENCING

## 2019-11-26 LAB — HIV-1 GENOTYPING

## 2019-11-26 NOTE — Progress Notes (Signed)
11/26/19 1017   OPAT - Sign On Note   Start Date 11/23/19   ABX ID [id number] IV/PO High Risk   Start Location [facility]   Diagnosis Pulmonary infection   Organisms Nocardia   Antibiotics Bactrim   ID Fellows/APPs Valeria Batman, MD   ID Attending Gregery Na, MD   IV Access Oral Only   Weekly Labs CBC with diff;BMP

## 2019-11-26 NOTE — Progress Notes (Signed)
Patient was discharged this week from [facility] on B[facility]rim.  He will need routine labs while on therapy.  Spoke to [place].  He is doing well and offers no concerns at this time.  He states he will come into the office on Monday for lab draw.

## 2019-11-29 ENCOUNTER — Ambulatory Visit: Payer: Medicaid Other | Attending: Infectious Diseases

## 2019-11-29 DIAGNOSIS — B20 Human immunodeficiency virus [HIV] disease: Secondary | ICD-10-CM | POA: Insufficient documentation

## 2019-11-29 DIAGNOSIS — Z79899 Other long term (current) drug therapy: Secondary | ICD-10-CM | POA: Insufficient documentation

## 2019-11-29 LAB — COMPREHENSIVE METABOLIC PANEL
ALT: 18 U/L (ref 0–50)
AST: 28 U/L (ref 0–50)
Albumin: 4 g/dL (ref 3.5–5.2)
Alk Phos: 67 U/L (ref 40–130)
Anion Gap: 10 (ref 7–16)
Bilirubin,Total: 0.3 mg/dL (ref 0.0–1.2)
CO2: 27 mmol/L (ref 20–28)
Calcium: 9.8 mg/dL (ref 9.0–10.3)
Chloride: 100 mmol/L (ref 96–108)
Creatinine: 0.94 mg/dL (ref 0.67–1.17)
GFR,Black: 113 *
GFR,Caucasian: 98 *
Glucose: 83 mg/dL (ref 60–99)
Lab: 4 mg/dL — ABNORMAL LOW (ref 6–20)
Potassium: 4.6 mmol/L (ref 3.3–5.1)
Sodium: 137 mmol/L (ref 133–145)
Total Protein: 7.3 g/dL (ref 6.3–7.7)

## 2019-11-29 LAB — CBC AND DIFFERENTIAL
Baso # K/uL: 0 10*3/uL (ref 0.0–0.1)
Basophil %: 0.6 %
Eos # K/uL: 0.3 10*3/uL (ref 0.0–0.5)
Eosinophil %: 6.2 %
Hematocrit: 40 % (ref 40–51)
Hemoglobin: 13.2 g/dL — ABNORMAL LOW (ref 13.7–17.5)
IMM Granulocytes #: 0 10*3/uL (ref 0.0–0.0)
IMM Granulocytes: 0.4 %
Lymph # K/uL: 1.3 10*3/uL (ref 1.3–3.6)
Lymphocyte %: 24 %
MCH: 33 pg — ABNORMAL HIGH (ref 26–32)
MCHC: 33 g/dL (ref 32–37)
MCV: 100 fL — ABNORMAL HIGH (ref 79–92)
Mono # K/uL: 0.7 10*3/uL (ref 0.3–0.8)
Monocyte %: 13.9 %
Neut # K/uL: 2.9 10*3/uL (ref 1.8–5.4)
Nucl RBC # K/uL: 0 10*3/uL (ref 0.0–0.0)
Nucl RBC %: 0 /100 WBC (ref 0.0–0.2)
Platelets: 450 10*3/uL — ABNORMAL HIGH (ref 150–330)
RBC: 4 MIL/uL — ABNORMAL LOW (ref 4.6–6.1)
RDW: 12.8 % (ref 11.6–14.4)
Seg Neut %: 54.9 %
WBC: 5.3 10*3/uL (ref 4.2–9.1)

## 2019-11-29 LAB — LYMPHOCYTE SUBSET (T CELLS ONLY)
CD4#: 115 cells/uL — ABNORMAL LOW (ref 496–2186)
CD4%: 9 % — ABNORMAL LOW (ref 32–71)
CD4/CD8: 0.2 — ABNORMAL LOW (ref 0.7–3.0)
CD8#: 757 cells/uL (ref 177–1137)
T LYM #(CD3): 941 cells/uL (ref 754–2810)
T LYM %(CD3): 71 % (ref 54–87)
T Suppress %(CD8): 57 % — ABNORMAL HIGH (ref 10–38)

## 2019-11-29 NOTE — Progress Notes (Signed)
Patient came into clinic and completed lab work.

## 2019-11-30 LAB — HIV VIRAL LOAD
HIV-1 PCR log: 2.7 copies/mL
HIV-1 PCR: 541 copies/mL

## 2019-12-01 ENCOUNTER — Telehealth: Payer: Self-pay | Admitting: Ophthalmology

## 2019-12-01 ENCOUNTER — Other Ambulatory Visit: Payer: Self-pay

## 2019-12-01 ENCOUNTER — Ambulatory Visit: Payer: Medicaid Other | Admitting: Ophthalmology

## 2019-12-01 DIAGNOSIS — H04123 Dry eye syndrome of bilateral lacrimal glands: Secondary | ICD-10-CM

## 2019-12-01 DIAGNOSIS — B0052 Herpesviral keratitis: Secondary | ICD-10-CM

## 2019-12-01 LAB — ASPERGILLUS ANTIGEN: Aspergillus Antigen by EIA: 0

## 2019-12-01 MED ORDER — CARBOXYMETHYLCELLULOSE SODIUM 0.5 % OP SOLN WRAPPED *I*
1.0000 [drp] | Freq: Four times a day (QID) | OPHTHALMIC | 0 refills | Status: AC
Start: 2019-12-01 — End: 2019-12-31
  Filled 2019-12-01: qty 15, 30d supply, fill #0

## 2019-12-01 MED ORDER — VALACYCLOVIR HCL 500 MG PO TABS *I*
1000.0000 mg | ORAL_TABLET | Freq: Two times a day (BID) | ORAL | 0 refills | Status: DC
Start: 2019-12-01 — End: 2019-12-14
  Filled 2019-12-01: qty 120, 30d supply, fill #0

## 2019-12-01 MED ORDER — CARBOXYMETHYLCELLULOSE SODIUM 0.5 % OP SOLN WRAPPED *I*
1.0000 [drp] | Freq: Four times a day (QID) | OPHTHALMIC | 0 refills | Status: DC
Start: 2019-12-01 — End: 2019-12-01

## 2019-12-01 MED ORDER — VALACYCLOVIR HCL 500 MG PO TABS *I*
1000.0000 mg | ORAL_TABLET | Freq: Two times a day (BID) | ORAL | 0 refills | Status: DC
Start: 2019-12-01 — End: 2019-12-01

## 2019-12-01 NOTE — Telephone Encounter (Signed)
Patient is saying [facility] will not fill his prescriptions because he has [facility] Option. Please send the Valtrex and Refresh Plus to [facility] instead.
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
## 2019-12-01 NOTE — Patient Instructions (Addendum)
VALACYCLOVIR 1 GRAM TWO X DAILY    ARTIFICIAL TEARS 4 X daily

## 2019-12-02 ENCOUNTER — Other Ambulatory Visit: Payer: Self-pay

## 2019-12-02 LAB — LEGIONELLA CULTURE

## 2019-12-04 ENCOUNTER — Other Ambulatory Visit: Payer: Self-pay | Admitting: Pulmonary Disease

## 2019-12-04 DIAGNOSIS — Z23 Encounter for immunization: Secondary | ICD-10-CM

## 2019-12-06 ENCOUNTER — Other Ambulatory Visit: Payer: Self-pay | Admitting: Radiology

## 2019-12-06 DIAGNOSIS — J984 Other disorders of lung: Secondary | ICD-10-CM

## 2019-12-06 DIAGNOSIS — F17211 Nicotine dependence, cigarettes, in remission: Secondary | ICD-10-CM

## 2019-12-06 MED ORDER — LIDOCAINE 5 % EX PTCH *I*
1.0000 | MEDICATED_PATCH | CUTANEOUS | 1 refills | Status: DC
Start: 2019-12-06 — End: 2022-07-02
  Filled 2019-12-06: qty 15, 15d supply, fill #0

## 2019-12-06 MED ORDER — NICOTINE 14 MG/24HR TD PT24 *I*
1.0000 | MEDICATED_PATCH | Freq: Every day | TRANSDERMAL | 1 refills | Status: DC
Start: 2019-12-06 — End: 2022-07-02
  Filled 2019-12-06: qty 30, 30d supply, fill #0

## 2019-12-06 NOTE — Telephone Encounter (Signed)
Patient needed to switch to [facility] due to insurance issues.

## 2019-12-07 ENCOUNTER — Other Ambulatory Visit: Payer: Self-pay

## 2019-12-08 ENCOUNTER — Ambulatory Visit: Payer: Medicaid Other | Admitting: Infectious Diseases

## 2019-12-08 NOTE — Telephone Encounter (Signed)
Dr Geraldine Contras did send the new Rx on 3/17

## 2019-12-10 ENCOUNTER — Telehealth: Payer: Self-pay

## 2019-12-10 LAB — FUNGUS CULTURE

## 2019-12-10 NOTE — Telephone Encounter (Signed)
Patient has not had labs this week.  Was unable to leave patient a message to get them done.  Will attempt to contact patient later.

## 2019-12-11 ENCOUNTER — Other Ambulatory Visit: Payer: Self-pay

## 2019-12-13 ENCOUNTER — Ambulatory Visit: Payer: Medicaid Other | Admitting: Ophthalmology

## 2019-12-14 ENCOUNTER — Other Ambulatory Visit: Payer: Self-pay

## 2019-12-14 ENCOUNTER — Ambulatory Visit: Payer: Medicaid Other | Attending: Infectious Diseases | Admitting: Infectious Diseases

## 2019-12-14 ENCOUNTER — Encounter: Payer: Self-pay | Admitting: Infectious Diseases

## 2019-12-14 VITALS — BP 116/86 | HR 105 | Temp 96.4°F | Resp 16 | Ht 71.0 in | Wt 173.9 lb

## 2019-12-14 DIAGNOSIS — K219 Gastro-esophageal reflux disease without esophagitis: Secondary | ICD-10-CM | POA: Insufficient documentation

## 2019-12-14 DIAGNOSIS — Z79899 Other long term (current) drug therapy: Secondary | ICD-10-CM | POA: Insufficient documentation

## 2019-12-14 DIAGNOSIS — A439 Nocardiosis, unspecified: Secondary | ICD-10-CM | POA: Insufficient documentation

## 2019-12-14 DIAGNOSIS — F17200 Nicotine dependence, unspecified, uncomplicated: Secondary | ICD-10-CM | POA: Insufficient documentation

## 2019-12-14 DIAGNOSIS — B0052 Herpesviral keratitis: Secondary | ICD-10-CM | POA: Insufficient documentation

## 2019-12-14 DIAGNOSIS — B20 Human immunodeficiency virus [HIV] disease: Secondary | ICD-10-CM | POA: Insufficient documentation

## 2019-12-14 LAB — CBC AND DIFFERENTIAL
Baso # K/uL: 0 10*3/uL (ref 0.0–0.1)
Basophil %: 0.9 %
Eos # K/uL: 0.1 10*3/uL (ref 0.0–0.5)
Eosinophil %: 3 %
Hematocrit: 47 % (ref 40–51)
Hemoglobin: 15.6 g/dL (ref 13.7–17.5)
IMM Granulocytes #: 0 10*3/uL (ref 0.0–0.0)
IMM Granulocytes: 0 %
Lymph # K/uL: 1 10*3/uL — ABNORMAL LOW (ref 1.3–3.6)
Lymphocyte %: 20.8 %
MCH: 34 pg — ABNORMAL HIGH (ref 26–32)
MCHC: 33 g/dL (ref 32–37)
MCV: 102 fL — ABNORMAL HIGH (ref 79–92)
Mono # K/uL: 0.6 10*3/uL (ref 0.3–0.8)
Monocyte %: 13.1 %
Neut # K/uL: 2.9 10*3/uL (ref 1.8–5.4)
Nucl RBC # K/uL: 0 10*3/uL (ref 0.0–0.0)
Nucl RBC %: 0 /100 WBC (ref 0.0–0.2)
Platelets: 198 10*3/uL (ref 150–330)
RBC: 4.6 MIL/uL (ref 4.6–6.1)
RDW: 13.1 % (ref 11.6–14.4)
Seg Neut %: 62.2 %
WBC: 4.7 10*3/uL (ref 4.2–9.1)

## 2019-12-14 LAB — COMPREHENSIVE METABOLIC PANEL
ALT: 18 U/L (ref 0–50)
AST: 36 U/L (ref 0–50)
Albumin: 4.4 g/dL (ref 3.5–5.2)
Alk Phos: 72 U/L (ref 40–130)
Anion Gap: 10 (ref 7–16)
Bilirubin,Total: 0.5 mg/dL (ref 0.0–1.2)
CO2: 28 mmol/L (ref 20–28)
Calcium: 9.7 mg/dL (ref 9.0–10.3)
Chloride: 104 mmol/L (ref 96–108)
Creatinine: 0.72 mg/dL (ref 0.67–1.17)
GFR,Black: 131 *
GFR,Caucasian: 113 *
Glucose: 72 mg/dL (ref 60–99)
Lab: <4 mg/dL — ABNORMAL LOW (ref 6–20)
Potassium: 4 mmol/L (ref 3.3–5.1)
Sodium: 142 mmol/L (ref 133–145)
Total Protein: 7.6 g/dL (ref 6.3–7.7)

## 2019-12-14 LAB — LYMPHOCYTE SUBSET (T CELLS ONLY)
CD4#: 78 cells/uL — ABNORMAL LOW (ref 496–2186)
CD4%: 8 % — ABNORMAL LOW (ref 32–71)
CD4/CD8: 0.2 — ABNORMAL LOW (ref 0.7–3.0)
CD8#: 489 cells/uL (ref 177–1137)
T LYM #(CD3): 621 cells/uL — ABNORMAL LOW (ref 754–2810)
T LYM %(CD3): 65 % (ref 54–87)
T Suppress %(CD8): 51 % — ABNORMAL HIGH (ref 10–38)

## 2019-12-14 MED ORDER — FLUCONAZOLE 100 MG PO TABS *I*
ORAL_TABLET | ORAL | 0 refills | Status: DC
Start: 2019-12-14 — End: 2020-01-12
  Filled 2019-12-14: qty 15, 14d supply, fill #0

## 2019-12-14 MED ORDER — VALACYCLOVIR HCL 1000 MG PO TABS *I*
1000.0000 mg | ORAL_TABLET | Freq: Two times a day (BID) | ORAL | 0 refills | Status: DC
Start: 2019-12-14 — End: 2020-01-12
  Filled 2019-12-14: qty 60, 30d supply, fill #0

## 2019-12-14 MED ORDER — BICTEGRAVIR-EMTRICITAB-TENOFOV 50-200-25 MG PO TABS *I*
1.0000 | ORAL_TABLET | Freq: Every day | ORAL | 5 refills | Status: DC
Start: 2019-12-14 — End: 2022-07-02
  Filled 2019-12-14: qty 30, 30d supply, fill #0

## 2019-12-14 NOTE — Progress Notes (Signed)
Subjective   Derek Walker is a 44 y.o. old Male who presents to the [facility] for follow-up care for HIV disease.    This is his first visit with me since June  2019.     HPI:   HIV: Currently on HAART: Yes (see medication list below for complete list of medications). Per Self report adherence is> 95% Yes. Missed doses: Past 4 days? No. Past 4 weeks? Yes. Barriers to adherence: Yes- ETOH use and insecure housing. Has been off medication for a couple weeks now- prior that reports he was on it while he was incarcerated and in substance abuse treatment program.   He was diagnosed in 2013 from heterosexual contact after he had oral thrush. He was initially prescribed Atripla shortly after dx. Previously he was on Bactrim for prophylaxis, but off since 2014 since CD4 % has been >14%. Modernized to [facility] 09/19/17 with sporadic compliance. Resumed treatment earlier this month when he was hospitalized for cavitary lung lesion (+Nocardia). Since hospitalization reports he has been compliant with medication and continues on Bactrim DS 2 tablets TID since hospital discharge.      Hx of PTSD, Hx of major psychotic depression: States he is in care with [facility], no longer has a case manager that he is aware of- states he would like one assigned to him. Has had some relapse with ETOH, but is working to get back on track. Has had multiple stressors with family and living situation that have exacerbated ETOH use- father passed away from complications of COVID, uncle passed away after several month hospitalization with COVID- very difficult on him especially with isolation- did not attend any funerals.     Any visits to ED/Specialists since last clinic visit? Yes - see chart     Review of Systems   Constitutional: Positive for malaise/fatigue-overall improved since recent hospitalization- focused on getting healthier. Negative for chills, diaphoresis, fever and weight loss.   HENT: Mouth feels dry, concerned  thrush is about to come back.   Eyes: Negative- s/sx reported during hospitalization have resolved.    Respiratory: Negative- states since hospitalization and starting abx he has been feeling much better.   Cardiovascular: Negative.   Gastrointestinal: Positive for heartburn, symptoms have improved since he has dramatically reduced his drinking EToH. Negative for blood in stool, constipation, diarrhea, and vomiting.   Genitourinary: Negative.   Musculoskeletal: Positive for myalgias. Negative for back pain, joint pain and neck pain.   Skin: Multiple hyperpigmented macular lesions, has eczema at baseline, but since hospitalization notes has more hyperpigmented areas than before, some itching, wondering if fungal.  Neurological: States since his ARV change, the tingling and weakness he used to feel in his feet and lower extremities has resolved. Negative for dizziness, tremors, sensory change, speech change, focal weakness and seizures.   Endo/Heme/Allergies: Negative.   Psychiatric/Behavioral: Positive for depression and substance abuse (states overall he is in remission at this time). Negative for hallucinations and suicidal ideas. The patient is not nervous/anxious and does not have insomnia.        Allergies reviewed and updated today: Patient has no known allergies (drug, envir, food or latex).     Medications:    Outpatient Medications Marked as Taking for the 12/14/19 encounter (Office Visit) with Jenna Luo, NP   Medication Sig Dispense Refill    bictegravir-emtricitabine-tenofovir (BIKTARVY) 50-200-25 MG per tablet Take 1 tablet by mouth daily 30 tablet 5    [DISCONTINUED] valACYclovir (VALTREX) 500  MG tablet Take 2 tablets (1,000 mg total) by mouth 2 times daily for 30 days 120 tablet 0    mirtazapine (REMERON) 15 MG tablet Take 1 tablet (15 mg total) by mouth nightly 30 tablet 0    folic acid (FOLVITE) 1 MG tablet Take 1 tablet (1 mg total) by mouth daily 30 tablet 0    melatonin 3 MG Take 1 tablet  (3 mg total) by mouth nightly as needed for Sleep 30 tablet 0    sulfamethoxazole-trimethoprim (BACTRIM DS,SEPTRA DS) 800-160 MG per tablet Take 2 tablets by mouth 3 times daily for 30 days 180 tablet 0       Medications marked discontinued were active as of onset of Today's encounter. Due to system issues, they may have been marked discontinued when renewed   Social History     Socioeconomic History    Marital status: Divorced     Spouse name: Not on file    Number of children: Not on file    Years of education: Not on file    Highest education level: Not on file   Tobacco Use    Smoking status: Current Every Day Smoker     Packs/day: 0.50     Years: 25.00     Pack years: 12.50     Types: Cigarettes     Start date: 7    Smokeless tobacco: Never Used    Tobacco comment: 5 cigarettes/day   Substance and Sexual Activity    Alcohol use: Yes     Alcohol/week: 2.0 standard drinks     Types: 2 Glasses of wine per week    Drug use: Yes     Types: Marijuana    Sexual activity: Not Currently     Partners: Female     Birth control/protection: None   Other Topics Concern    Not on file   Social History Narrative    Has 3 adult daughters and one young daughter (born ~69) who lives with her mother in [place].      Possibly looking to go to [employer] to study car mechanics as of Sept 2017.        Number of sexual partners in the past 6 months - 0   Partner(s) knowledge of patient's HIV status - not applicable   Partner(s)' HIV status - N/a   Partner's history of HIV testing - N/A   Using condoms - n/a     Objective :   Physical Exam   Constitutional: He is oriented to person, place, and time. He appears well-developed and well-nourished. No distress.   HENT:   Head: Normocephalic and atraumatic.   Right Ear: External ear normal.   Left Ear: External ear normal.   Mouth/Throat: Oropharynx is clear and moist. No oropharyngeal exudate.   Eyes: Right eye exhibits no discharge. Left eye exhibits no discharge. No  scleral icterus.   Neck: Normal range of motion. No tracheal deviation present. No thyromegaly present.   Cardiovascular: Normal rate, regular rhythm and normal heart sounds. Exam reveals no gallop and no friction rub.   No murmur heard.   Pulmonary/Chest: Effort normal and breath sounds normal. No stridor. No respiratory distress. He has no wheezes. He has no rales.   Abdominal: Soft. Bowel sounds are normal. He exhibits no distension and no mass or tenderness. There is no rebound and no guarding.   Musculoskeletal: Normal range of motion. He exhibits no edema or deformity.   Lymphadenopathy:   He  has no cervical adenopathy.   Neurological: He is alert and oriented to person, place, and time.   Skin: Skin is warm and dry. Multiple scattered hyperpigmented macules over bilateral arms and trunk, circular- ranging from 1-4cm, no drainage or edema. He is not diaphoretic.    Vitals reviewed.     Vitals:   Vitals:    12/14/19 0859   BP: 116/86   Pulse: 105   Resp: 16   Temp: 35.8 C (96.4 F)   Weight: 78.9 kg (173 lb 14.4 oz)   Height: 1.803 m (5\' 11" )     Pain    12/14/19 0859   PainSc:   0 - No pain     Body mass index is 24.25 kg/m.        Results:  CD4 -   Lab Results   Component Value Date    CD4A 78 (L) 12/14/2019    CD4A 115 (L) 11/29/2019    CD4A 54 (L) 11/17/2019     Viral load -   Lab Results   Component Value Date    HIV 541 11/29/2019    HIV 32,800 11/17/2019    HIV 29,600 11/17/2019         Assessment/Plan :   1. HIV: Here for follow-up for HIV disease   ART: On ART consisting of: Biktarvy (TAF/FTC/BTG). Stable continue current medication   Safer sexual practices were discussed with the patient: Yes - currently celibate   CBC, CD4,  CMP done today and were returned stable. Viral load pending.   Currently on Bactrim DS 2 tablets TID, when done with this course should resume PCP prophylaxis with Bactrim DS 1 tablet daily.   Medication adherence counseling was performed : Yes. Discussed at length    2. Health  maintenance:   Vaccinations: Due for Menactra, hold off as we are trying to get him scheduled for COVID vaccine and he should not have any other vaccinations within 2 weeks before COVID vaccine. Scheduled for end of April with [facility] in [facility]. Gave info for [facility] vaccine clinics to see if he can get sooner appt.   Dental: Overdue for FUV, seen at [facility]- aware he needs to re-book   Opthalmology: Seen 12/01/19- F/U per ophthalmology recommendations  Lipid screen: stable 05/06/17  A1C WNL 05/06/17  STD screen: Declines need for GC/CT screening- had negative urine screen 09/19/17, Syphilis screen sent 05/06/17, NEG.   UA stable 05/06/17  TB Ag Stim test WNL 05/06/18    3. Healthy Lifestyle Counseling:   The patient is advised to quit smoking, begin progressive daily aerobic exercise program, follow a low fat, low cholesterol diet, attempt to lose weight, decrease or avoid alcohol intake, continue current medications, continue current healthy lifestyle patterns and return for routine annual checkups.    4. Hx of PTSD, substance abuse, anxiety with depression: Continue medications and care per mental health.   Re: coping with HIV, at past visit we discussed his good prognosis with continued treatment, U=U concept, etc- he states he was already aware of these things, but still has difficulty coping. Not addressed today.     5. Tobacco dependence: Three plus minutes of smoking cessation counseling done at prior visit- reinforced today. ROptions of cessation aides reviewed including nicotine patch, hypnosis, and possible prescription medications. Patient encouraged to follow-up with outpatient provider. He is down to 5 cigarettes per day- he would like to see if he can continue weaning on his own without any cessation aids.  We discussed behavior modification techniques at length today- switching hands, brands, routine, etc.      6. Hx of eczema, rash: Continue topicals per PCP. See below re: fluconazole under #9    7.  GERD: Can resume omeprazole 40mg  daily if needed in the future, counseled about ETOH avoidance. F/U with PCP.     8. Erectile Dysfunctions: not addressed today, at prior visit discussed possible causes. Will send early AM testosterone to rule out hypogonadism, advised if low, we will need to repeat value and if still low we can start testosterone replacement through our clinic or his PCP (if they offer this). Advised if WNL, there would be no benefit to testosterone treatment and he should speak with PCP about possible medication side effect (which could potentially be mitigated by ED medications).     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 F/U with Dr William Hamburger 4/9 as scheduled. CBC and CMP done today were stable. Rx for fluconazole due to patient concern of possible thrush starting (highly susceptible for this given low T-cells and high dose abx). Advised patient if any fungal component to his rash the fluconazole will likely help this as well.     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     Return to clinic:in 2-4 weeks or sooner if needed.     Renato Gails, NP 12/14/2019 11:22 PM

## 2019-12-15 ENCOUNTER — Other Ambulatory Visit: Payer: Self-pay

## 2019-12-15 LAB — HIV VIRAL LOAD
HIV-1 PCR log: 2.4 copies/mL
HIV-1 PCR: 250 copies/mL

## 2019-12-20 ENCOUNTER — Other Ambulatory Visit: Payer: Self-pay

## 2019-12-21 ENCOUNTER — Telehealth: Payer: Self-pay

## 2019-12-21 NOTE — Telephone Encounter (Signed)
Patient has not had labs in several days.  He remains on high dose bactrim.  He can go to the lab tomorrow for blood work.  He confirmed his follow up with Dr Magnus Ivan on 4/9.

## 2019-12-22 ENCOUNTER — Ambulatory Visit: Payer: Medicaid Other | Attending: Critical Care Medicine

## 2019-12-22 DIAGNOSIS — B20 Human immunodeficiency virus [HIV] disease: Secondary | ICD-10-CM

## 2019-12-22 LAB — CBC AND DIFFERENTIAL
Baso # K/uL: 0 10*3/uL (ref 0.0–0.1)
Basophil %: 0.6 %
Eos # K/uL: 0.5 10*3/uL (ref 0.0–0.5)
Eosinophil %: 7.3 %
Hematocrit: 42 % (ref 40–51)
Hemoglobin: 14.3 g/dL (ref 13.7–17.5)
IMM Granulocytes #: 0 10*3/uL (ref 0.0–0.0)
IMM Granulocytes: 0 %
Lymph # K/uL: 1.5 10*3/uL (ref 1.3–3.6)
Lymphocyte %: 24.3 %
MCH: 34 pg — ABNORMAL HIGH (ref 26–32)
MCHC: 35 g/dL (ref 32–37)
MCV: 99 fL — ABNORMAL HIGH (ref 79–92)
Mono # K/uL: 0.9 10*3/uL — ABNORMAL HIGH (ref 0.3–0.8)
Monocyte %: 14.1 %
Neut # K/uL: 3.3 10*3/uL (ref 1.8–5.4)
Nucl RBC # K/uL: 0 10*3/uL (ref 0.0–0.0)
Nucl RBC %: 0 /100 WBC (ref 0.0–0.2)
Platelets: 151 10*3/uL (ref 150–330)
RBC: 4.2 MIL/uL — ABNORMAL LOW (ref 4.6–6.1)
RDW: 12.8 % (ref 11.6–14.4)
Seg Neut %: 53.7 %
WBC: 6.2 10*3/uL (ref 4.2–9.1)

## 2019-12-22 LAB — BASIC METABOLIC PANEL
Anion Gap: 14 (ref 7–16)
CO2: 23 mmol/L (ref 20–28)
Calcium: 9.9 mg/dL (ref 9.0–10.3)
Chloride: 99 mmol/L (ref 96–108)
Creatinine: 0.98 mg/dL (ref 0.67–1.17)
GFR,Black: 108 *
GFR,Caucasian: 93 *
Glucose: 85 mg/dL (ref 60–99)
Lab: 5 mg/dL — ABNORMAL LOW (ref 6–20)
Potassium: 3.8 mmol/L (ref 3.3–5.1)
Sodium: 136 mmol/L (ref 133–145)

## 2019-12-24 ENCOUNTER — Ambulatory Visit: Payer: Medicaid Other | Admitting: Infectious Diseases

## 2019-12-24 ENCOUNTER — Ambulatory Visit: Payer: Medicaid Other | Admitting: Infectious Disease

## 2019-12-24 ENCOUNTER — Other Ambulatory Visit: Payer: Self-pay

## 2019-12-24 ENCOUNTER — Telehealth: Payer: Self-pay | Admitting: Infectious Diseases

## 2019-12-24 ENCOUNTER — Ambulatory Visit: Payer: Medicaid Other | Admitting: Internal Medicine

## 2019-12-24 NOTE — Telephone Encounter (Signed)
Discussed care with ID Fellow (Dr Iva Lento). Patient missed appt with ID team today for F/U on Nocardia. I saw him last week in my clinic and clinically he was doing well and subjectively he was reporting he felt much better.     ID is planning for treatment for 6 months with Bactrim DS 2 tablets TID (by mouth). With follow-up CT scans done around 2 months and 6 months follow-up to check for resolution.     Given this patients difficulty with care coordination and compliance- I have agreed to take over the monitoring of the Nocardia with the following plan:   Continue Bactrim DS 2 tablets TID by mouth for 6 months (early September).   Repeat CT Scan around month 5-6 (around when we are looking to possibly stop Bactrim)-- to assess for resolution. If not resolved, but clinically doing well- will continue Bactrim and will repeat imaging in the future (interval TBD based on result).   D/W Dr Iva Lento that (given poor patient compliance) I would plan to defer any repeat imaging until the 5-6 month point (Unless patient worsens clinically at anytime in which case- we will immediately repeat chest CT).     I will plan to follow patient monthly (as I would for any patient who is newly back on ART and not undetectable). BMP is part of routine labs for this management- if any clinically significant worsening of kidney function or hyperkalemia- will re-connect with ID (Dr Magnus Ivan and/or Dr Iva Lento) on possible modifications to the above treatment plan.

## 2019-12-27 ENCOUNTER — Telehealth: Payer: Self-pay | Admitting: Infectious Diseases

## 2019-12-27 DIAGNOSIS — B20 Human immunodeficiency virus [HIV] disease: Secondary | ICD-10-CM

## 2019-12-27 DIAGNOSIS — Z792 Long term (current) use of antibiotics: Secondary | ICD-10-CM

## 2019-12-27 NOTE — Telephone Encounter (Signed)
Spoke with patient- he reports he is on all medications and doing ok- had some conflicts on Friday last week so he missed his appointments.     He states he will plan to get labs done this week.     Per prior conversation with Dr Magnus Ivan- patient with monitor labs at a minimum q 2 weeks on high dose Bactrim (ideally labs should be done weekly).     Overall labs have been stable- but platelets are down trending- may need to switch abx if this continues. Discussed this with patient today so that he was aware of the importance for labs.

## 2019-12-29 ENCOUNTER — Ambulatory Visit: Payer: Medicaid Other | Attending: Infectious Diseases

## 2019-12-29 DIAGNOSIS — Z792 Long term (current) use of antibiotics: Secondary | ICD-10-CM

## 2019-12-29 DIAGNOSIS — B20 Human immunodeficiency virus [HIV] disease: Secondary | ICD-10-CM | POA: Insufficient documentation

## 2019-12-29 LAB — CBC AND DIFFERENTIAL
Baso # K/uL: 0 10*3/uL (ref 0.0–0.1)
Basophil %: 0.4 %
Eos # K/uL: 0.2 10*3/uL (ref 0.0–0.5)
Eosinophil %: 2 %
Hematocrit: 44 % (ref 40–51)
Hemoglobin: 15.1 g/dL (ref 13.7–17.5)
IMM Granulocytes #: 0 10*3/uL (ref 0.0–0.0)
IMM Granulocytes: 0.3 %
Lymph # K/uL: 1.3 10*3/uL (ref 1.3–3.6)
Lymphocyte %: 14.2 %
MCH: 34 pg — ABNORMAL HIGH (ref 26–32)
MCHC: 35 g/dL (ref 32–37)
MCV: 100 fL — ABNORMAL HIGH (ref 79–92)
Mono # K/uL: 1.1 10*3/uL — ABNORMAL HIGH (ref 0.3–0.8)
Monocyte %: 11.9 %
Neut # K/uL: 6.4 10*3/uL — ABNORMAL HIGH (ref 1.8–5.4)
Nucl RBC # K/uL: 0 10*3/uL (ref 0.0–0.0)
Nucl RBC %: 0 /100 WBC (ref 0.0–0.2)
Platelets: 219 10*3/uL (ref 150–330)
RBC: 4.4 MIL/uL — ABNORMAL LOW (ref 4.6–6.1)
RDW: 12.6 % (ref 11.6–14.4)
Seg Neut %: 71.2 %
WBC: 9 10*3/uL (ref 4.2–9.1)

## 2019-12-29 LAB — COMPREHENSIVE METABOLIC PANEL
ALT: 16 U/L (ref 0–50)
AST: 39 U/L (ref 0–50)
Albumin: 4.2 g/dL (ref 3.5–5.2)
Alk Phos: 78 U/L (ref 40–130)
Anion Gap: 13 (ref 7–16)
Bilirubin,Total: 0.4 mg/dL (ref 0.0–1.2)
CO2: 24 mmol/L (ref 20–28)
Calcium: 9.6 mg/dL (ref 9.0–10.3)
Chloride: 104 mmol/L (ref 96–108)
Creatinine: 0.82 mg/dL (ref 0.67–1.17)
GFR,Black: 124 *
GFR,Caucasian: 107 *
Glucose: 69 mg/dL (ref 60–99)
Lab: <4 mg/dL — ABNORMAL LOW (ref 6–20)
Potassium: 4.3 mmol/L (ref 3.3–5.1)
Sodium: 141 mmol/L (ref 133–145)
Total Protein: 8 g/dL — ABNORMAL HIGH (ref 6.3–7.7)

## 2019-12-29 NOTE — Progress Notes (Signed)
Patient came into clinic and completed labs.

## 2019-12-30 LAB — HIV VIRAL LOAD
HIV-1 PCR log: 2.17 copies/mL
HIV-1 PCR: 148 copies/mL

## 2020-01-02 LAB — AFB CULTURE
AFB Culture: 0
AFB Culture: 0

## 2020-01-03 ENCOUNTER — Ambulatory Visit: Payer: Medicaid Other | Admitting: Family Medicine

## 2020-01-03 LAB — AFB CULTURE: AFB Culture: 0

## 2020-01-04 ENCOUNTER — Telehealth: Payer: Self-pay

## 2020-01-04 NOTE — Telephone Encounter (Signed)
Called pt regarding no show and phone not accepting calls at this time. Sending letter today to schedule.

## 2020-01-06 LAB — AFB BLOOD ISOLATOR CULTURE: AFB Blood Isolator Culture: 0

## 2020-01-07 ENCOUNTER — Telehealth: Payer: Self-pay | Admitting: Infectious Diseases

## 2020-01-07 NOTE — Telephone Encounter (Signed)
Please call patient to see if we can arrange for FUV with me on Wednesday. Try [phone] as the first number to reach him. If not working, use number on file.

## 2020-01-07 NOTE — Telephone Encounter (Signed)
Called the patient multiple times but the voicemail is not set up yet. Will try calling at a later time.

## 2020-01-10 NOTE — Telephone Encounter (Signed)
Spoke to the patient and informed him of his scheduled appt.

## 2020-01-10 NOTE — Telephone Encounter (Signed)
Called the patient and could not leave a message because his voicemail was not set up yet.

## 2020-01-12 ENCOUNTER — Ambulatory Visit: Payer: Medicaid Other | Attending: Infectious Diseases | Admitting: Infectious Diseases

## 2020-01-12 ENCOUNTER — Other Ambulatory Visit: Payer: Self-pay

## 2020-01-12 VITALS — BP 140/78 | HR 119 | Temp 98.4°F | Ht 71.0 in | Wt 173.5 lb

## 2020-01-12 DIAGNOSIS — H538 Other visual disturbances: Secondary | ICD-10-CM

## 2020-01-12 DIAGNOSIS — Z13220 Encounter for screening for lipoid disorders: Secondary | ICD-10-CM | POA: Insufficient documentation

## 2020-01-12 DIAGNOSIS — Z79899 Other long term (current) drug therapy: Secondary | ICD-10-CM | POA: Insufficient documentation

## 2020-01-12 DIAGNOSIS — R221 Localized swelling, mass and lump, neck: Secondary | ICD-10-CM | POA: Insufficient documentation

## 2020-01-12 DIAGNOSIS — B20 Human immunodeficiency virus [HIV] disease: Secondary | ICD-10-CM

## 2020-01-12 DIAGNOSIS — Z131 Encounter for screening for diabetes mellitus: Secondary | ICD-10-CM | POA: Insufficient documentation

## 2020-01-12 DIAGNOSIS — R918 Other nonspecific abnormal finding of lung field: Secondary | ICD-10-CM | POA: Insufficient documentation

## 2020-01-12 DIAGNOSIS — E559 Vitamin D deficiency, unspecified: Secondary | ICD-10-CM | POA: Insufficient documentation

## 2020-01-12 DIAGNOSIS — H53149 Visual discomfort, unspecified: Secondary | ICD-10-CM | POA: Insufficient documentation

## 2020-01-12 DIAGNOSIS — A430 Pulmonary nocardiosis: Secondary | ICD-10-CM | POA: Insufficient documentation

## 2020-01-12 LAB — URINALYSIS REFLEX TO CULTURE
Blood,UA: NEGATIVE
Glucose,UA: NEGATIVE mg/dL
Nitrite,UA: NEGATIVE
Specific Gravity,UA: 1.024 (ref 1.002–1.030)
pH,UA: >=9 — AB (ref 5.0–8.0)

## 2020-01-12 LAB — COMPREHENSIVE METABOLIC PANEL
ALT: 69 U/L — ABNORMAL HIGH (ref 0–50)
AST: 148 U/L — ABNORMAL HIGH (ref 0–50)
Albumin: 4.3 g/dL (ref 3.5–5.2)
Alk Phos: 80 U/L (ref 40–130)
Anion Gap: 14 (ref 7–16)
Bilirubin,Total: 0.5 mg/dL (ref 0.0–1.2)
CO2: 25 mmol/L (ref 20–28)
Calcium: 9.6 mg/dL (ref 9.0–10.3)
Chloride: 101 mmol/L (ref 96–108)
Creatinine: 0.77 mg/dL (ref 0.67–1.17)
GFR,Black: 127 *
GFR,Caucasian: 110 *
Glucose: 79 mg/dL (ref 60–99)
Lab: <4 mg/dL — ABNORMAL LOW (ref 6–20)
Potassium: 3.9 mmol/L (ref 3.3–5.1)
Sodium: 140 mmol/L (ref 133–145)
Total Protein: 7.8 g/dL — ABNORMAL HIGH (ref 6.3–7.7)

## 2020-01-12 LAB — CBC AND DIFFERENTIAL
Baso # K/uL: 0 10*3/uL (ref 0.0–0.1)
Basophil %: 0.4 %
Eos # K/uL: 0.1 10*3/uL (ref 0.0–0.5)
Eosinophil %: 1.7 %
Hematocrit: 43 % (ref 40–51)
Hemoglobin: 14.6 g/dL (ref 13.7–17.5)
IMM Granulocytes #: 0 10*3/uL (ref 0.0–0.0)
IMM Granulocytes: 0.2 %
Lymph # K/uL: 1.2 10*3/uL — ABNORMAL LOW (ref 1.3–3.6)
Lymphocyte %: 23 %
MCH: 34 pg — ABNORMAL HIGH (ref 26–32)
MCHC: 34 g/dL (ref 32–37)
MCV: 100 fL — ABNORMAL HIGH (ref 79–92)
Mono # K/uL: 0.5 10*3/uL (ref 0.3–0.8)
Monocyte %: 9.8 %
Neut # K/uL: 3.4 10*3/uL (ref 1.8–5.4)
Nucl RBC # K/uL: 0 10*3/uL (ref 0.0–0.0)
Nucl RBC %: 0 /100 WBC (ref 0.0–0.2)
Platelets: 132 10*3/uL — ABNORMAL LOW (ref 150–330)
RBC: 4.3 MIL/uL — ABNORMAL LOW (ref 4.6–6.1)
RDW: 13.2 % (ref 11.6–14.4)
Seg Neut %: 64.9 %
WBC: 5.2 10*3/uL (ref 4.2–9.1)

## 2020-01-12 LAB — LIPID PANEL
Chol/HDL Ratio: 1.7
Cholesterol: 172 mg/dL
HDL: 102 mg/dL — ABNORMAL HIGH (ref 40–60)
LDL Calculated: 60 mg/dL
Non HDL Cholesterol: 70 mg/dL
Triglycerides: 51 mg/dL

## 2020-01-12 LAB — URINE MICROSCOPIC (IQ200)

## 2020-01-12 LAB — LYMPHOCYTE SUBSET (T CELLS ONLY)
CD4#: 103 cells/uL — ABNORMAL LOW (ref 496–2186)
CD4%: 11 % — ABNORMAL LOW (ref 32–71)
CD4/CD8: 0.2 — ABNORMAL LOW (ref 0.7–3.0)
CD8#: 527 cells/uL (ref 177–1137)
T LYM #(CD3): 679 cells/uL — ABNORMAL LOW (ref 754–2810)
T LYM %(CD3): 69 % (ref 54–87)
T Suppress %(CD8): 54 % — ABNORMAL HIGH (ref 10–38)

## 2020-01-12 LAB — VITAMIN D: 25-OH Vit Total: 9 ng/mL — ABNORMAL LOW (ref 30–60)

## 2020-01-12 MED ORDER — PANTOPRAZOLE SODIUM 40 MG PO TBEC *I*
40.0000 mg | DELAYED_RELEASE_TABLET | Freq: Two times a day (BID) | ORAL | 0 refills | Status: AC
Start: 2020-01-12 — End: 2020-02-12
  Filled 2020-01-12: qty 60, 30d supply, fill #0

## 2020-01-12 MED ORDER — VALACYCLOVIR HCL 1000 MG PO TABS *I*
1000.0000 mg | ORAL_TABLET | Freq: Two times a day (BID) | ORAL | 1 refills | Status: DC
Start: 2020-01-12 — End: 2022-07-02
  Filled 2020-01-12: qty 60, 30d supply, fill #0

## 2020-01-12 MED ORDER — SULFAMETHOXAZOLE-TRIMETHOPRIM 800-160 MG PO TABS *I*
2.0000 | ORAL_TABLET | Freq: Three times a day (TID) | ORAL | 0 refills | Status: DC
Start: 2020-01-12 — End: 2022-07-02
  Filled 2020-01-12: qty 180, 30d supply, fill #0

## 2020-01-12 NOTE — Progress Notes (Signed)
Subjective   Derek Walker is a 44 y.o. old Male who presents to the Infectious Disease Clinic for follow-up care for HIV disease.     HPI:   HIV: Currently on HAART: Yes (see medication list below for complete list of medications). Per Self report adherence is> 95% Yes. Missed doses: Past 4 days? No. Past 4 weeks? Yes. Barriers to adherence: Yes- ETOH use and insecure housing. Has been off medication for a couple weeks now- prior that reports he was on it while he was incarcerated and in substance abuse treatment program.   He was diagnosed in 2013 from heterosexual contact after he had oral thrush. He was initially prescribed Atripla shortly after dx. Previously he was on Bactrim for prophylaxis, but off since 2014 since CD4 % has been >14%. Modernized to [facility] 09/19/17 with sporadic compliance. Resumed treatment earlier this month when he was hospitalized for cavitary lung lesion (+Nocardia). Since hospitalization reports he has been compliant with medication and continues on Bactrim DS 2 tablets TID since hospital discharge.     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 Vision overall is better than when he was hospitalized- but for the past week progressively more blurry in his vision- R is worse than left.      Hx of PTSD, Hx of major psychotic depression: States he is in care with [facility], no longer has a case manager that he is aware of- states he would like one assigned to him. Has had some relapse with ETOH, but is working to get back on track- drinking much less now, but still drinking 2-3 days per week. Has had multiple stressors with family and living situation that have exacerbated ETOH use- father passed away from complications of COVID, uncle passed away after several month hospitalization with COVID- very difficult on him especially with isolation- did not attend any funerals.     Any visits to ED/Specialists since last clinic visit? Yes - see chart     Review of Systems   Constitutional:  Positive for malaise/fatigue-overall improved since recent hospitalization- focused on getting healthier. Negative for chills, diaphoresis, fever and weight loss.   HENT: Mouth improved from last visit, no longer dry feeling- believes thrush is resolved. Notes along left neck area had a swollen tender mass that suddenly appears about a week ago. It has improved some, still tender- but not as tender as it was, size is smaller than it was at onset- but not sure how much smaller. Denies any injuries or new exposures.   Eyes: See above in HPI, also notes some photophobia, he is able to tolerate lights in the current room, but feels eyes are very sensitive to bright sunlight- has been carrying around sunglasses with him which he doesn't normally need to do.    Respiratory: Negative- states since hospitalization and starting abx he has been feeling much better.   Cardiovascular: Negative.   Gastrointestinal: Positive for heartburn, symptoms have improved since he has dramatically reduced his drinking EToH. For the past week he has had some increased nausea and has not been as hungry- but denies any blood in stool, constipation, diarrhea, or vomiting.   Genitourinary: Negative.   Musculoskeletal: Positive for myalgias. Negative for back pain, joint pain and neck pain- except as noted above in HENT.   Skin: Multiple hyperpigmented macular lesions mentioned at prior visit, has eczema at baseline, notes today that these seem to have improved since last visit.   Neurological: States since his ARV change,  the tingling and weakness he used to feel in his feet and lower extremities has resolved. Negative for dizziness, tremors, sensory change, speech change, focal weakness and seizures.   Endo/Heme/Allergies: Negative.   Psychiatric/Behavioral: Positive for depression and substance abuse (states overall he is in remission at this time w/ exception of some ETOH use which he notes is much less- drinking 3-4 beers 3 times per week).  Negative for hallucinations and suicidal ideas. The patient is not nervous/anxious and does not have insomnia.        Allergies reviewed and updated today: Patient has no known allergies (drug, envir, food or latex).     Medications:    Outpatient Medications Marked as Taking for the 01/12/20 encounter (Office Visit) with Renato Gails, NP   Medication Sig Dispense Refill    valACYclovir (VALTREX) 1 GM tablet Take 1 tablet (1,000 mg total) by mouth 2 times daily 60 tablet 1    sulfamethoxazole-trimethoprim (BACTRIM DS,SEPTRA DS) 800-160 MG per tablet Take 2 tablets by mouth 3 times daily 180 tablet 0    pantoprazole (PROTONIX) 40 MG EC tablet Take 1 tablet (40 mg total) by mouth 2 times daily (before meals) Swallow whole. Do not crush, break, or chew. 60 tablet 0    bictegravir-emtricitabine-tenofovir (BIKTARVY) 50-200-25 MG per tablet Take 1 tablet by mouth daily 30 tablet 5    nicotine (NICODERM CQ) 14 MG/24HR patch Apply 1 patch onto the skin daily Remove & discard patch after 24 hours. 30 patch 1       Medications marked discontinued were active as of onset of Today's encounter. Due to system issues, they may have been marked discontinued when renewed   Social History     Socioeconomic History    Marital status: Divorced     Spouse name: Not on file    Number of children: Not on file    Years of education: Not on file    Highest education level: Not on file   Tobacco Use    Smoking status: Current Every Day Smoker     Packs/day: 0.50     Years: 25.00     Pack years: 12.50     Types: Cigarettes     Start date: 29    Smokeless tobacco: Never Used    Tobacco comment: 5 cigarettes/day   Substance and Sexual Activity    Alcohol use: Yes     Alcohol/week: 2.0 standard drinks     Types: 2 Glasses of wine per week    Drug use: Yes     Types: Marijuana    Sexual activity: Not Currently     Partners: Female     Birth control/protection: None   Other Topics Concern    Not on file   Social History  Narrative    Has 3 adult daughters and one young daughter (born ~26) who lives with her mother in [place].      Possibly looking to go to [employer] to study car mechanics as of Sept 2017.        Number of sexual partners in the past 6 months - 0   Partner(s) knowledge of patient's HIV status - not applicable   Partner(s)' HIV status - N/a   Partner's history of HIV testing - N/A   Using condoms - n/a        Physical Exam  Vitals reviewed.   Constitutional:       General: He is not in acute distress.  Appearance: Normal appearance. He is not ill-appearing, toxic-appearing or diaphoretic.   HENT:      Head: Normocephalic and atraumatic.      Mouth/Throat:      Mouth: Mucous membranes are moist.      Pharynx: Oropharynx is clear. No oropharyngeal exudate or posterior oropharyngeal erythema.   Eyes:      General:         Right eye: No discharge.         Left eye: No discharge.      Extraocular Movements:      Right eye: No nystagmus.      Left eye: No nystagmus.      Pupils:      Right eye: Pupil is sluggish. Pupil is round and reactive.      Left eye: Pupil is round, reactive and not sluggish.   Neck:      Vascular: No carotid bruit.      Comments: No nuchal rigidity   Cardiovascular:      Rate and Rhythm: Regular rhythm. Tachycardia present.      Heart sounds: No murmur heard.   No friction rub. No gallop.    Pulmonary:      Effort: Pulmonary effort is normal. No respiratory distress.      Breath sounds: Normal breath sounds. No stridor. No wheezing, rhonchi or rales.   Chest:      Chest wall: No tenderness.   Abdominal:      General: Abdomen is flat. There is no distension.      Palpations: Abdomen is soft. There is no mass.      Tenderness: There is no abdominal tenderness.      Hernia: No hernia is present.   Musculoskeletal:         General: Normal range of motion.      Cervical back: Normal range of motion and neck supple. No rigidity or tenderness.   Lymphadenopathy:      Cervical: Cervical adenopathy  (along SCM soft mobile mass on left cervical area) present.   Skin:     General: Skin is warm and dry.   Neurological:      General: No focal deficit present.      Mental Status: He is alert and oriented to person, place, and time.         Vitals:   Vitals:    01/12/20 1356   BP: 140/78   Pulse: (!) 119   Temp: 36.9 C (98.4 F)   Weight: 78.7 kg (173 lb 8 oz)   Height: 1.803 m (5\' 11" )     Pain    01/12/20 1356   PainSc:   7   PainLoc: Neck     Body mass index is 24.2 kg/m.        Results:  CD4 -   Lab Results   Component Value Date    CD4A 103 (L) 01/12/2020    CD4A 78 (L) 12/14/2019    CD4A 115 (L) 11/29/2019     Viral load -   Lab Results   Component Value Date    HIV 148 12/29/2019    HIV 250 12/14/2019    HIV 541 11/29/2019         Assessment/Plan :   1. HIV: Here for follow-up for HIV disease   ART: On ART consisting of: Biktarvy (TAF/FTC/BTG). Stable continue current medication   Safer sexual practices were discussed with the patient: Yes - currently celibate   CBC, CD4,  CMP done today and were returned stable. Viral load pending.   Currently on Bactrim DS 2 tablets TID, when done with this course should resume PCP prophylaxis with Bactrim DS 1 tablet daily- but will likely be on treatment for about a year for Nocardia   Medication adherence counseling was performed : Yes. Discussed at length    2. Health maintenance:   Vaccinations: Due for Menactra, hold off as we are trying to get him scheduled for COVID vaccine and he should not have any other vaccinations within 2 weeks before COVID vaccine. [place] 27 [place] [facility] [place]. Gave info for [facility] clinics to see if he can get sooner appt.   Dental: Overdue for FUV, [facility]- aware he needs to re-book   Opthalmology: Seen 12/01/19- F/U per ophthalmology recommendations, scheduled for Friday given acute change to vision- see below  Lipid screen: WNL 01/12/20  A1C WNL 01/12/20  STD screen: Declines need for GC/CT screening- had  negative urine screen 09/19/17, Syphilis screen sent 01/12/20, NEG.   UA stable 11/17/19, repeat pending  TB Ag Stim test WNL 11/17/19    3. Healthy Lifestyle Counseling:   The patient is advised to quit smoking, begin progressive daily aerobic exercise program, follow a low fat, low cholesterol diet, attempt to lose weight, decrease or avoid alcohol intake, continue current medications, continue current healthy lifestyle patterns and return for routine annual checkups.    4. Hx of PTSD, substance abuse, anxiety with depression: Continue medications and care per mental health.   Re: coping with HIV, at past visit we discussed his good prognosis with continued treatment, U=U concept, etc- he states he was already aware of these things, but still has difficulty coping. Not addressed today.     5. Tobacco dependence: Three plus minutes of smoking cessation counseling done at prior visit- reinforced today. Options of cessation aides reviewed including nicotine patch, hypnosis, and possible prescription medications. Patient encouraged to follow-up with outpatient provider. He as patches and plans to start those soon. We have also given PRN lozenges in addition.  We discussed behavior modification techniques at length at visit last month- switching hands, brands, routine, etc.      6. Hx of eczema, rash: Continue topicals per PCP. Not addressed today    7. GERD: Can resume omeprazole 40mg  daily if needed in the future, counseled about ETOH avoidance. F/U with PCP.     8. Erectile Dysfunction: not addressed today, at prior visit discussed possible causes. Will send early AM testosterone to rule out hypogonadism, advised if low, we will need to repeat value and if still low we can start testosterone replacement through our clinic or his PCP (if they offer this). Advised if WNL, there would be no benefit to testosterone treatment and he should speak with PCP about possible medication side effect (which could potentially be  mitigated by ED medications).     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 Patient missed F/U with Dr Magnus IvanLouie 4/9. D/W Dr Magnus IvanLouie- see notes in chart. Writer will plan to monitor with CBC and CMP q2 weeks. Per Dr Magnus IvanLouie if platelets less than 100 we will consider switching to minocycline. Follow-up CT of chest ordered and scheduled by [facility] for 01/25/20 at 7:10PM [facility]. Expect he will need 12 months of abx per Dr Magnus IvanLouie.     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     11. Thrush: Resolved with fluconazole. Will retreat in future if needed. Could consider prophylaxis if reoccurs.     12. Cervical mass: HPI/exam reassuring- likely benign since it is improving. Ultrasound ordered to further characterize mass, relevant findings in report as follows: Within the area of interest along the origin of the left anterior neck, are few nonenlarged normal-appearing lymph nodes within the level 3 cervical station. Incidental note of small intramuscular fat deposition within the sternocleidomastoid muscle. No additional abnormalities found. Will continue to monitor and address as needed.     Return to clinic:in 2-3 weeks or sooner if needed.     Jenna Luo, NP 01/12/2020 2:50 PM

## 2020-01-13 ENCOUNTER — Other Ambulatory Visit: Payer: Self-pay

## 2020-01-13 ENCOUNTER — Ambulatory Visit
Admission: RE | Admit: 2020-01-13 | Discharge: 2020-01-13 | Disposition: A | Discharge status: Home / Self Care | Payer: Medicaid Other | Source: Ambulatory Visit | Attending: Infectious Diseases | Admitting: Infectious Diseases

## 2020-01-13 ENCOUNTER — Encounter: Payer: Self-pay | Admitting: Specialist

## 2020-01-13 ENCOUNTER — Telehealth: Payer: Self-pay | Admitting: Infectious Diseases

## 2020-01-13 DIAGNOSIS — R221 Localized swelling, mass and lump, neck: Secondary | ICD-10-CM | POA: Insufficient documentation

## 2020-01-13 DIAGNOSIS — D849 Immunodeficiency, unspecified: Secondary | ICD-10-CM

## 2020-01-13 LAB — HIV VIRAL LOAD
HIV-1 PCR log: 4.82 copies/mL
HIV-1 PCR: 66400 copies/mL

## 2020-01-13 LAB — SYPHILIS SCREEN
Syphilis Screen: NEGATIVE
Syphilis Status: NONREACTIVE

## 2020-01-13 LAB — HEMOGLOBIN A1C: Hemoglobin A1C: 4.8 %

## 2020-01-13 NOTE — Telephone Encounter (Addendum)
Spoke with patient about rising VL, he states he thought he was taking his Biktarvy, but has multiple pills he is working with and putting into med boxes. He states he is getting Radio producer delivered tomorrow from pharmacy and will make a point to make sure this gets into his pill box- we will recheck his VL in a few weeks.     He will continue other medications as prescribed.     Patient reports he is having transportation issues getting to/from appts. He is wondering if we can help him get transportation set up. Will ask social work to call him- Clinical research associate is willing to fill out temporary taxi cab form for the next 12 months. 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     Called pt re transportation.  He has an eye appt tomorrow, but said he has transportation to that appt--recently his sister has been bringing him to appts.  But he does need help with 5/11 and 5/12 appts.  He  had Medicaid taxi in the past when he was involved with [facility].  I explained that I can set up him up and will be contacting him next week re approval for Medicaid taxi.    Cleda Clarks LMSW   [address]   [address]  [place] [address]  [phone]

## 2020-01-14 ENCOUNTER — Ambulatory Visit: Payer: Medicaid Other | Admitting: Student in an Organized Health Care Education/Training Program

## 2020-01-17 ENCOUNTER — Other Ambulatory Visit: Payer: Self-pay

## 2020-01-19 ENCOUNTER — Telehealth: Payer: Self-pay | Admitting: Specialist

## 2020-01-19 NOTE — Telephone Encounter (Signed)
Called pt but unable to reach him and unable to leave a message.  He has been approved for Medicaid taxi for a year, but [employer] has outdated info re his address and phone #.  Trying to reach pt to resolve.  I will continue to try to reach him re this.    Cleda Clarks LMSW   [address]   [address]  [place] [address]  [phone]

## 2020-01-21 ENCOUNTER — Other Ambulatory Visit: Payer: Self-pay

## 2020-01-21 ENCOUNTER — Telehealth: Payer: Self-pay | Admitting: Specialist

## 2020-01-21 NOTE — Telephone Encounter (Signed)
Attempted to reach pt by phone to let him know about transportation.  Today I called [employer] and updated pt's address in the system so I could set up his rides.  He is now scheduled with [employer] to go to his imaging appt on 01/25/20.  They will pick him up at 6 PM for the 7:10 PM appt on [address]  Invoice # for the ride is [phone].    On 01/26/20 [facility] transportation will pick up pt at 2:30 PM for his 3:30 PM [facility]t [facility].  Invoice # for the ride is [phone].    Will continue to call pt re arrangements.    Cleda Clarks LMSW   [address]   [address]  [place] [address]  [phone]

## 2020-01-25 ENCOUNTER — Ambulatory Visit: Payer: Medicaid Other

## 2020-01-26 ENCOUNTER — Ambulatory Visit: Payer: Medicaid Other | Admitting: Infectious Diseases

## 2020-01-26 ENCOUNTER — Telehealth: Payer: Self-pay | Admitting: Specialist

## 2020-01-26 NOTE — Telephone Encounter (Signed)
Called pt today re appt and transportation.  He seemed not to remember the appt--said he had left his phone in someone else's car which is why I could not reach him.    He said he will wait for the cab.  I said it may be late, so he should be prepared.    Cleda Clarks LMSW   [address]   [address]  [place] [address]  [phone]

## 2020-01-26 NOTE — Telephone Encounter (Signed)
Have attempted to reach pt several times via phone and no one answers.  He does not have voicemail so I cannot leave a message.  I will try this AM as he has an appt this PM with Mardene Celeste NP.    Cleda Clarks LMSW   [address]   [address]  [place] [address]  [phone]

## 2020-02-01 NOTE — Telephone Encounter (Signed)
Called the patient several times and was unable to leave a message due to the voicemail not being set up.

## 2020-02-01 NOTE — Telephone Encounter (Signed)
Please call patient to see if he can come in and see me this Friday for f/u visit. Please let socialwork know if he needs assistance with transporation    His number is [phone], if not working can try to call his mother

## 2020-02-02 NOTE — Telephone Encounter (Signed)
Called the patient twice but he did not answer and this writer was unable to leave a message. Called his mother's number but his mother was not home per his uncle.

## 2020-02-08 NOTE — Telephone Encounter (Signed)
Spoke to the patient and scheduled for this Friday 5/28 with Tresa Endo.

## 2020-02-08 NOTE — Telephone Encounter (Signed)
Called the patient twice but was unable to leave a message due to the voicemail not being set up yet.

## 2020-02-09 ENCOUNTER — Other Ambulatory Visit: Payer: Self-pay

## 2020-02-11 ENCOUNTER — Other Ambulatory Visit: Payer: Self-pay

## 2020-02-11 ENCOUNTER — Ambulatory Visit: Payer: Medicaid Other | Admitting: Infectious Diseases

## 2020-02-17 NOTE — Telephone Encounter (Addendum)
Patient missed appt last week Friday- please attempt to reschedule Friday next week in my clinic.     I have ordered labs- if he can come to clinic to get those done tomorrow or early next week- please book appt on lab schedule.     If he needs assistance with transportation please send to social work

## 2020-02-17 NOTE — Telephone Encounter (Signed)
Called the patient twice and the voicemail is not set up yet. Unable to leave a voicemail. Seems like the phone is powered down.

## 2020-02-17 NOTE — Addendum Note (Signed)
Addended by: Jenna Luo on: 02/17/2020 04:16 PM     Modules accepted: Orders

## 2020-02-22 ENCOUNTER — Telehealth: Payer: Self-pay

## 2020-02-22 NOTE — Telephone Encounter (Signed)
Patient has missed multiple follow ups in ID.  He has not had labs in several weeks.  Patient's phone is turned off.  Spoke to Mathias's mother, [name].  She gave Clinical research associate the number for his girl, [name] [phone]).      Spoke to [name]'s son.  Dontrez did not want to talk at this time.  It was requested that he give our office a callback.

## 2020-02-28 NOTE — Telephone Encounter (Signed)
Attempted to call again re: follow-up, no answer. [facility]- please attempt to call again later this week.

## 2020-02-29 NOTE — Telephone Encounter (Signed)
Attempted to call the patient but his phone is off.

## 2020-03-02 NOTE — Progress Notes (Signed)
03/02/20 8185   Conclusion of Therapy   Date Therapy Completed 03/02/20   Transition to Oral Antibiotics   (Patient has failed to complete the recommended course.)   Follow up labs or imaging needed none

## 2020-03-03 NOTE — Telephone Encounter (Signed)
Called patient several times but was not able to speak to the patient. Phone kept ringing and then had a busy signal.

## 2020-03-10 NOTE — Telephone Encounter (Signed)
Attempted to schedule the patient for 7/2 but the patient's phone keeps ringing and disconnects. Unable to schedule this patient or leave a voicemail.

## 2020-03-10 NOTE — Telephone Encounter (Signed)
Called the patient twice but no answer. The phone keeps ringing and then disconnects. This Clinical research associate was unable to leave a Engineer, technical sales.

## 2020-03-17 ENCOUNTER — Encounter: Payer: Self-pay | Admitting: Infectious Diseases

## 2020-03-17 NOTE — Telephone Encounter (Addendum)
Attempted to call today to coordinate care and f/u- no answer, unable to leave voicemail.     I have made a letter and put it in our outgoing mail.     [facility]- please make attempt to call him next week after the holiday weekend. His number is [phone]

## 2020-03-21 NOTE — Telephone Encounter (Signed)
Called the patient to schedule with Tresa Endo but the phone number keeps ringing and disconnects. Unable to schedule this patient at this time but will call later today.

## 2020-03-21 NOTE — Telephone Encounter (Signed)
Called the patient again but the phone keeps ringing and then has a busy signal. Unable to contact the patient to schedule with Tresa Endo.

## 2020-04-26 ENCOUNTER — Encounter: Payer: Self-pay | Admitting: Infectious Diseases

## 2020-04-26 NOTE — Telephone Encounter (Signed)
Called the patient on his mobile and it said the number has be changed or disconnected. Unable to reach the patient for a fuv. Patient was mailed a letter today as well.

## 2020-04-26 NOTE — Telephone Encounter (Signed)
Sent another letter today. [facility]- please attempt to call to re-engage in care when able.

## 2020-09-20 ENCOUNTER — Other Ambulatory Visit: Payer: Self-pay

## 2020-09-20 NOTE — Progress Notes (Signed)
Pt will be due for a colonoscopy in the next couple months. Letter sent.

## 2021-03-31 ENCOUNTER — Other Ambulatory Visit: Payer: Self-pay

## 2021-03-31 ENCOUNTER — Emergency Department (HOSPITAL_BASED_OUTPATIENT_CLINIC_OR_DEPARTMENT_OTHER): Payer: Self-pay

## 2021-03-31 ENCOUNTER — Encounter (HOSPITAL_BASED_OUTPATIENT_CLINIC_OR_DEPARTMENT_OTHER): Payer: Self-pay | Admitting: Emergency Medicine

## 2021-03-31 ENCOUNTER — Emergency Department (HOSPITAL_BASED_OUTPATIENT_CLINIC_OR_DEPARTMENT_OTHER)
Admission: EM | Admit: 2021-03-31 | Discharge: 2021-03-31 | Disposition: A | Discharge status: Home / Self Care | Payer: Self-pay | Attending: Emergency Medicine | Admitting: Emergency Medicine

## 2021-03-31 DIAGNOSIS — B20 Human immunodeficiency virus [HIV] disease: Secondary | ICD-10-CM

## 2021-03-31 DIAGNOSIS — B370 Candidal stomatitis: Secondary | ICD-10-CM

## 2021-03-31 DIAGNOSIS — Z21 Asymptomatic human immunodeficiency virus [HIV] infection status: Secondary | ICD-10-CM | POA: Insufficient documentation

## 2021-03-31 DIAGNOSIS — J029 Acute pharyngitis, unspecified: Secondary | ICD-10-CM | POA: Insufficient documentation

## 2021-03-31 DIAGNOSIS — R519 Headache, unspecified: Secondary | ICD-10-CM | POA: Insufficient documentation

## 2021-03-31 DIAGNOSIS — R590 Localized enlarged lymph nodes: Secondary | ICD-10-CM | POA: Insufficient documentation

## 2021-03-31 DIAGNOSIS — F17210 Nicotine dependence, cigarettes, uncomplicated: Secondary | ICD-10-CM | POA: Insufficient documentation

## 2021-03-31 DIAGNOSIS — Z20822 Contact with and (suspected) exposure to covid-19: Secondary | ICD-10-CM | POA: Insufficient documentation

## 2021-03-31 HISTORY — DX: Human immunodeficiency virus (HIV) disease: B20

## 2021-03-31 LAB — CBC WITH DIFFERENTIAL/PLATELET
Abs Immature Granulocytes: 0.01 10*3/uL (ref 0.00–0.07)
Basophils Absolute: 0 10*3/uL (ref 0.0–0.1)
Basophils Relative: 0 %
Eosinophils Absolute: 0.1 10*3/uL (ref 0.0–0.5)
Eosinophils Relative: 1 %
HCT: 44.1 % (ref 39.0–52.0)
Hemoglobin: 15.1 g/dL (ref 13.0–17.0)
Immature Granulocytes: 0 %
Lymphocytes Relative: 11 %
Lymphs Abs: 0.8 10*3/uL (ref 0.7–4.0)
MCH: 33.7 pg (ref 26.0–34.0)
MCHC: 34.2 g/dL (ref 30.0–36.0)
MCV: 98.4 fL (ref 80.0–100.0)
Monocytes Absolute: 1 10*3/uL (ref 0.1–1.0)
Monocytes Relative: 15 %
Neutro Abs: 4.9 10*3/uL (ref 1.7–7.7)
Neutrophils Relative %: 73 %
Platelets: 177 10*3/uL (ref 150–400)
RBC: 4.48 MIL/uL (ref 4.22–5.81)
RDW: 11.9 % (ref 11.5–15.5)
WBC: 6.8 10*3/uL (ref 4.0–10.5)
nRBC: 0 % (ref 0.0–0.2)

## 2021-03-31 LAB — RESP PANEL BY RT-PCR (FLU A&B, COVID) ARPGX2
Influenza A by PCR: NEGATIVE
Influenza B by PCR: NEGATIVE
SARS Coronavirus 2 by RT PCR: NEGATIVE

## 2021-03-31 LAB — COMPREHENSIVE METABOLIC PANEL
ALT: 22 U/L (ref 0–44)
AST: 54 U/L — ABNORMAL HIGH (ref 15–41)
Albumin: 4.1 g/dL (ref 3.5–5.0)
Alkaline Phosphatase: 51 U/L (ref 38–126)
Anion gap: 9 (ref 5–15)
BUN: 5 mg/dL — ABNORMAL LOW (ref 6–20)
CO2: 25 mmol/L (ref 22–32)
Calcium: 8.6 mg/dL — ABNORMAL LOW (ref 8.9–10.3)
Chloride: 106 mmol/L (ref 98–111)
Creatinine, Ser: 0.91 mg/dL (ref 0.61–1.24)
GFR, Estimated: >60 mL/min (ref >60)
Glucose, Bld: 77 mg/dL (ref 70–99)
Potassium: 4.3 mmol/L (ref 3.5–5.1)
Sodium: 140 mmol/L (ref 135–145)
Total Bilirubin: 0.5 mg/dL (ref 0.3–1.2)
Total Protein: 7.8 g/dL (ref 6.5–8.1)

## 2021-03-31 LAB — GROUP A STREP BY PCR: Group A Strep by PCR: NOT DETECTED

## 2021-03-31 MED ORDER — IOHEXOL 300 MG/ML  SOLN
80.0000 mL | Freq: Once | INTRAMUSCULAR | Status: AC | PRN
  Administered 2021-03-31: 100 mL via INTRAVENOUS

## 2021-03-31 MED ORDER — ALUM & MAG HYDROXIDE-SIMETH 200-200-20 MG/5ML PO SUSP
30.0000 mL | Freq: Once | ORAL | Status: AC
  Administered 2021-03-31: 30 mL via ORAL
  Filled 2021-03-31: qty 30

## 2021-03-31 MED ORDER — LIDOCAINE VISCOUS HCL 2 % MT SOLN
15.0000 mL | Freq: Once | OROMUCOSAL | Status: AC
  Administered 2021-03-31: 15 mL via ORAL
  Filled 2021-03-31: qty 15

## 2021-03-31 MED ORDER — SODIUM CHLORIDE 0.9 % IV BOLUS
1000.0000 mL | Freq: Once | INTRAVENOUS | Status: AC
  Administered 2021-03-31: 1000 mL via INTRAVENOUS

## 2021-03-31 MED ORDER — METOCLOPRAMIDE HCL 5 MG/ML IJ SOLN
10.0000 mg | Freq: Once | INTRAMUSCULAR | Status: AC
  Administered 2021-03-31: 10 mg via INTRAVENOUS
  Filled 2021-03-31: qty 2

## 2021-03-31 MED ORDER — DIPHENHYDRAMINE HCL 50 MG/ML IJ SOLN
25.0000 mg | Freq: Once | INTRAMUSCULAR | Status: AC
  Administered 2021-03-31: 25 mg via INTRAVENOUS
  Filled 2021-03-31: qty 1

## 2021-03-31 MED ORDER — FLUCONAZOLE 200 MG PO TABS: 200.0000 mg | ORAL_TABLET | Freq: Every day | ORAL | 0 refills | Status: AC

## 2021-03-31 MED ORDER — FLUCONAZOLE 150 MG PO TABS
150.0000 mg | ORAL_TABLET | Freq: Once | ORAL | Status: AC
  Administered 2021-03-31: 150 mg via ORAL
  Filled 2021-03-31: qty 1

## 2021-03-31 NOTE — ED Triage Notes (Signed)
Pt to ED via [facility] w/ c/o HA, sore throat x 1 wk

## 2021-03-31 NOTE — Discharge Instructions (Addendum)
You have oral candidiasis likely from uncontrolled HIV  Please take Diflucan daily for a week  You need to call infectious disease office on Monday to get an appointment to start on your antiviral treatment   Return to ER if you have worse trouble swallowing, headache, vomiting, fever
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
## 2021-04-03 LAB — HIV-1 RNA QUANT-NO REFLEX-BLD
HIV 1 RNA Quant: 26900 copies/mL
LOG10 HIV-1 RNA: 4.43 log10copy/mL

## 2021-05-24 ENCOUNTER — Telehealth: Payer: Self-pay

## 2021-05-24 NOTE — Telephone Encounter (Signed)
Per request of the provider, writer reached out to patient to schedule follow up visit; no response from patient. Patient's voicemail was not set up, so no message could be left.

## 2021-08-06 ENCOUNTER — Ambulatory Visit
Admission: EM | Admit: 2021-08-06 | Discharge: 2021-08-06 | Disposition: A | Discharge status: Home / Self Care | Payer: Self-pay

## 2021-08-06 ENCOUNTER — Other Ambulatory Visit: Payer: Self-pay

## 2021-08-06 NOTE — ED Notes (Signed)
Attempt to call patient.

## 2021-08-06 NOTE — ED Notes (Signed)
Attempt to call patient from lobby and phone, no repsonse.

## 2021-10-25 ENCOUNTER — Telehealth: Payer: Self-pay

## 2021-10-25 NOTE — Telephone Encounter (Signed)
called patient to schedule an Annual Appointment. Invalid number

## 2021-11-06 ENCOUNTER — Telehealth: Payer: Self-pay | Admitting: Infectious Diseases

## 2021-11-06 NOTE — Telephone Encounter (Signed)
Copied from CRM #[id number]. Topic: Appointments - Schedule Appointment  >> Nov 06, 2021  1:59 PM Adah Salvage wrote:  Mr. Reinoso is calling to schedule an appointment.    Appointment Type:NPV  Provider Requested: Kelly-Last seen in 2021  What does patient need to be seen for?: Establish care-Patient stated that he was out of town and initiated care but never went through with it.   Preferred location?: Yes   Type of insurance: Medicaid MVP Option      Is there a referral on file?: No  Were the demographics and insurance verified?: Yes  Was the patient connected to RIM if applicable?: N/A  Was the appropriate expectation set with the patient for a time frame to receive a return call from the office? N/A    Patient can be reached at:[phone]

## 2021-11-16 ENCOUNTER — Ambulatory Visit: Payer: Medicaid Other | Admitting: Infectious Diseases

## 2021-11-16 ENCOUNTER — Other Ambulatory Visit: Payer: Self-pay

## 2022-01-08 ENCOUNTER — Telehealth: Payer: Self-pay

## 2022-01-08 ENCOUNTER — Other Ambulatory Visit: Payer: Self-pay

## 2022-01-08 ENCOUNTER — Other Ambulatory Visit
Admission: RE | Admit: 2022-01-08 | Discharge: 2022-01-08 | Disposition: A | Discharge status: Home / Self Care | Payer: Medicaid Other | Source: Ambulatory Visit | Attending: Infectious Diseases | Admitting: Infectious Diseases

## 2022-01-08 ENCOUNTER — Other Ambulatory Visit: Payer: Self-pay | Admitting: Infectious Diseases

## 2022-01-08 ENCOUNTER — Ambulatory Visit: Payer: Medicaid Other | Attending: Specialist

## 2022-01-08 DIAGNOSIS — B20 Human immunodeficiency virus [HIV] disease: Secondary | ICD-10-CM | POA: Insufficient documentation

## 2022-01-08 DIAGNOSIS — H5709 Other anomalies of pupillary function: Secondary | ICD-10-CM | POA: Insufficient documentation

## 2022-01-08 DIAGNOSIS — H43813 Vitreous degeneration, bilateral: Secondary | ICD-10-CM | POA: Insufficient documentation

## 2022-01-08 DIAGNOSIS — H50111 Monocular exotropia, right eye: Secondary | ICD-10-CM | POA: Insufficient documentation

## 2022-01-08 LAB — COMPREHENSIVE METABOLIC PANEL
ALT: 29 U/L (ref 0–50)
AST: 53 U/L — ABNORMAL HIGH (ref 0–50)
Albumin: 4.6 g/dL (ref 3.5–5.2)
Alk Phos: 75 U/L (ref 40–130)
Anion Gap: 16 (ref 7–16)
Bilirubin,Total: 0.7 mg/dL (ref 0.0–1.2)
CO2: 22 mmol/L (ref 20–28)
Calcium: 9.7 mg/dL (ref 8.6–10.2)
Chloride: 100 mmol/L (ref 96–108)
Creatinine: 0.77 mg/dL (ref 0.67–1.17)
Glucose: 124 mg/dL — ABNORMAL HIGH (ref 60–99)
Lab: 6 mg/dL (ref 6–20)
Potassium: 4.1 mmol/L (ref 3.3–5.1)
Sodium: 138 mmol/L (ref 133–145)
Total Protein: 7.7 g/dL (ref 6.3–7.7)
eGFR BY CREAT: 112 *

## 2022-01-08 LAB — CBC AND DIFFERENTIAL
Baso # K/uL: 0 10*3/uL (ref 0.0–0.1)
Basophil %: 0.3 %
Eos # K/uL: 0.1 10*3/uL (ref 0.0–0.5)
Eosinophil %: 1.8 %
Hematocrit: 42 % (ref 40–51)
Hemoglobin: 14.2 g/dL (ref 13.7–17.5)
IMM Granulocytes #: 0 10*3/uL (ref 0.0–0.0)
IMM Granulocytes: 0.1 %
Lymph # K/uL: 0.8 10*3/uL — ABNORMAL LOW (ref 1.3–3.6)
Lymphocyte %: 10.3 %
MCH: 32 pg (ref 26–32)
MCHC: 34 g/dL (ref 32–37)
MCV: 95 fL — ABNORMAL HIGH (ref 79–92)
Mono # K/uL: 1.3 10*3/uL — ABNORMAL HIGH (ref 0.3–0.8)
Monocyte %: 17.5 %
Neut # K/uL: 5.2 10*3/uL (ref 1.8–5.4)
Nucl RBC # K/uL: 0 10*3/uL (ref 0.0–0.0)
Nucl RBC %: 0 /100 WBC (ref 0.0–0.2)
Platelets: 201 10*3/uL (ref 150–330)
RBC: 4.4 MIL/uL — ABNORMAL LOW (ref 4.6–6.1)
RDW: 11.6 % (ref 11.6–14.4)
Seg Neut %: 70 %
WBC: 7.4 10*3/uL (ref 4.2–9.1)

## 2022-01-08 MED ORDER — FLUCONAZOLE 100 MG PO TABS *I*
100.0000 mg | ORAL_TABLET | Freq: Once | ORAL | 0 refills | Status: AC
Start: 2022-01-08 — End: 2022-01-11
  Filled 2022-01-08: qty 1, 1d supply, fill #0

## 2022-01-08 MED ORDER — BIKTARVY 50-200-25 MG PO TABS
1.0000 | ORAL_TABLET | Freq: Every day | ORAL | 5 refills | Status: DC
Start: 2022-01-08 — End: 2022-07-02
  Filled 2022-01-08: qty 30, 30d supply, fill #0

## 2022-01-08 MED ORDER — BIKTARVY 30-120-15 MG PO TABS
1.0000 | ORAL_TABLET | Freq: Every day | ORAL | 5 refills | Status: DC
Start: 2022-01-08 — End: 2022-01-08
  Filled 2022-01-08: qty 30, 30d supply, fill #0

## 2022-01-08 NOTE — Patient Instructions (Addendum)
You have something called strabismus. This is when the eyes are not aligned. This can cause double vision and can worsen when you are tired or stressed or straining your eyes.    For your dry ocular surface:     Apply preservative-free lubricating drops 3 to 6 times daily to both eyes. Because these drops do not have preservatives, dispose a plastic vial within 24 hours after opening    Optional: Apply lubricating eye ointment nightly before bed to both eyes.      Genteal, Systane, Refresh, Bausch+Lomb Biotrue are all brands you can obtain over the counter that come in preservative free formulations.          Apply warm compresses nightly or twice daily for 10 minutes. Ensure the compress is warm, but not hot.   Wash your eyelashes and eyelids with warm water and unscented bar soap (Dove, [place]) 1-2 times daily.  Soak clean wash cloth in warm water. Optional (add 1 tsp of salt for 1 cup of water) and apply to closed eyelids for 2-3 minutes. Then take a mild, white, unscented soap (Dove, [place], Caress) on the same washcloth, and rub along eyelash line and lid margins with closed eyes. You must be careful not to touch the eyeballs as you scrub the lid margins, as this will cause irritation from soap, which is usually harmless.  Rinse the eyelid with plenty of warm water.   Repeat eyelid cleaning in the mornings and at night before bedtime.       Do not use Visine.    You should continue to use these instructions for several weeks even if your symptoms initially resolved to continue to heal the dryness.

## 2022-01-08 NOTE — Telephone Encounter (Signed)
Note:       This note is being entered by Child psychotherapist providing coverage for the [employer] in the absence of an assigned Child psychotherapist to that clinical area      Derek Walker  [id number]  [address]  [place] [address]     REFERRAL FROM:   [facility] [employer] staff Sue Lush)  PRESENTING PROBLEM:               Transportation   ADDITIONAL INFORMATION:   Patient has active Medicaid   No MAS 2015 locatable   ACTION TAKEN:               Do to urgent nature of visit and time constraints writer completed  MAS 2015 good until 01/14/2022 to ensure patient could get to this medically urgent visit    Trip Confirmation 4/25     Derek Walker is scheduled for a Ophthalmologist/Eyes appointment on 01/08/2022 at 5:00pm.     At 4:00pm [name] will pick up Derek Walker at [address] [place] and take them to [address], [place]     Once appointment is  [employer] will pick up Derek Walker at [address], [place] and take them to [address], [place]         [employer] can be reached at [phone]  This trip was arranged using MAS's on-line scheduling system with confirmed  Inv# [id number]
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
## 2022-01-09 LAB — SYPHILIS SCREEN
Syphilis Screen: NEGATIVE
Syphilis Status: NONREACTIVE

## 2022-01-09 LAB — HEPATITIS C QUANTITATIVE/GENOTYPE
HCV PCR log: NOT DETECTED IU/mL
HCV PCR: NOT DETECTED IU/mL

## 2022-01-09 LAB — LYMPHOCYTE SUBSET (T CELLS ONLY)
CD4#: 30 cells/uL — ABNORMAL LOW (ref 496–2186)
CD4%: 5 % — ABNORMAL LOW (ref 32–71)
CD4/CD8: 0.1 — ABNORMAL LOW (ref 0.7–3.0)
CD8#: 355 cells/uL (ref 177–1137)
T LYM #(CD3): 424 cells/uL — ABNORMAL LOW (ref 754–2810)
T LYM %(CD3): 71 % (ref 54–87)
T Suppress %(CD8): 60 % — ABNORMAL HIGH (ref 10–38)

## 2022-01-09 LAB — HEMOGLOBIN A1C: Hemoglobin A1C: 5.3 %

## 2022-01-09 LAB — HIV-1 QUANTITATIVE NAAT
HIV-1 PCR log: 4.46 copies/mL
HIV-1 PCR: 28700 copies/mL

## 2022-01-10 ENCOUNTER — Other Ambulatory Visit: Payer: Self-pay

## 2022-01-10 ENCOUNTER — Other Ambulatory Visit: Payer: Self-pay | Admitting: Infectious Diseases

## 2022-01-10 DIAGNOSIS — B20 Human immunodeficiency virus [HIV] disease: Secondary | ICD-10-CM

## 2022-01-14 LAB — CRYPTOCOCCAL ANTIGEN

## 2022-01-15 LAB — TB AG T-CELL STIMULATION: TB AG T-Cell Stim: NEGATIVE IU/mL

## 2022-01-18 ENCOUNTER — Other Ambulatory Visit: Payer: Self-pay | Admitting: Infectious Diseases

## 2022-01-18 DIAGNOSIS — B20 Human immunodeficiency virus [HIV] disease: Secondary | ICD-10-CM

## 2022-01-21 ENCOUNTER — Other Ambulatory Visit
Admission: RE | Admit: 2022-01-21 | Discharge: 2022-01-21 | Disposition: A | Discharge status: Home / Self Care | Payer: Medicaid Other | Source: Ambulatory Visit | Attending: Infectious Diseases | Admitting: Infectious Diseases

## 2022-01-21 DIAGNOSIS — B20 Human immunodeficiency virus [HIV] disease: Secondary | ICD-10-CM | POA: Insufficient documentation

## 2022-01-22 LAB — CRYPTOCOCCAL ANTIGEN: Cryptococcus Antigen: 0

## 2022-01-22 LAB — HEPATITIS A IGG AB: Hepatitis A IGG: POSITIVE

## 2022-01-23 ENCOUNTER — Telehealth: Payer: Self-pay

## 2022-01-23 NOTE — Telephone Encounter (Signed)
Spoke to Weldon from [facility] lab who says the test that was ordered was just a reflex and that and HIV test needs to be ordered or to call the lab to let them know what test is needed for this patient at [phone] option 1.

## 2022-01-30 LAB — HIV-1 DRUG RESISTANCE BY NGS

## 2022-02-27 LAB — UNMAPPED LAB RESULTS
Basophil # (HT): 0 10 3/uL (ref 0.0–0.2)
Basophil % (HT): 1 % (ref 0–3)
Eosinophil # (HT): 0.3 10 3/uL (ref 0.0–0.6)
Eosinophil % (HT): 5 % (ref 0–5)
Hematocrit (HT): 44 % (ref 40–52)
Hemoglobin (HGB) (HT): 14.7 g/dL (ref 13.0–18.0)
Lymphocyte # (HT): 0.7 10 3/uL — ABNORMAL LOW (ref 1.0–4.8)
Lymphocyte % (HT): 12 % — ABNORMAL LOW (ref 15–45)
MCHC (HT): 33.6 g/dL (ref 32.0–37.5)
MCV (HT): 95 fL (ref 80–100)
Mean Corpuscular Hemoglobin (MCH) (HT): 31.8 pg (ref 26.0–34.0)
Monocyte # (HT): 1 10 3/uL (ref 0.1–1.0)
Monocyte % (HT): 16 % — ABNORMAL HIGH (ref 0–15)
Neutrophil # (HT): 4.2 10 3/uL (ref 1.8–8.0)
Platelets (HT): 202 10 3/uL (ref 150–450)
RBC (HT): 4.62 10 6/uL (ref 4.40–6.20)
RDW (HT): 11.9 % (ref 0.0–15.2)
Seg Neut % (HT): 66 % (ref 45–75)
WBC (HT): 6.4 10 3/uL (ref 4.0–11.0)

## 2022-03-01 ENCOUNTER — Telehealth: Payer: Self-pay

## 2022-03-01 NOTE — Telephone Encounter (Signed)
Called patient, unable to reach patient at this time, left a message to call and schedule a follow up appointment for 02/27/22 ED Transition

## 2022-03-01 NOTE — Telephone Encounter (Signed)
02/27/2022-ED Transition; Brief chart review. Arrived to ED with c/o Groin Pain.  Pt was evaluated and discharged.      In-basket routed to scheduling; Please call pt and offer to schedule FUV for 3-5 days after 02/27/2022 ED visit.    Venetia Maxon, RN   Medicine in Psychiatry   [phone]  [place], [place] [address]  [phone]

## 2022-03-08 ENCOUNTER — Other Ambulatory Visit: Payer: Self-pay

## 2022-03-08 MED ORDER — FLUCONAZOLE 100 MG PO TABS *I*
100.0000 mg | ORAL_TABLET | Freq: Every day | ORAL | 0 refills | Status: AC
Start: 2022-03-08 — End: 2022-03-18
  Filled 2022-03-08: qty 10, 10d supply, fill #0

## 2022-03-08 MED ORDER — FLUCONAZOLE 100 MG PO TABS *I*
ORAL_TABLET | ORAL | 0 refills | Status: DC
Start: 2022-03-08 — End: 2022-03-08

## 2022-03-08 MED ORDER — BICTEGRAVIR-EMTRICITAB-TENOFOV 50-200-25 MG PO TABS *I*
1.0000 | ORAL_TABLET | Freq: Every day | ORAL | 5 refills | Status: DC
Start: 2022-03-08 — End: 2023-02-19
  Filled 2022-03-08: qty 30, 30d supply, fill #0
  Filled 2022-04-30: qty 30, 30d supply, fill #1

## 2022-04-30 ENCOUNTER — Other Ambulatory Visit: Payer: Self-pay

## 2022-04-30 ENCOUNTER — Other Ambulatory Visit: Payer: Self-pay | Admitting: Infectious Diseases

## 2022-04-30 DIAGNOSIS — B20 Human immunodeficiency virus [HIV] disease: Secondary | ICD-10-CM

## 2022-04-30 MED ORDER — TRAZODONE HCL 50 MG PO TABS *I*
50.0000 mg | ORAL_TABLET | Freq: Every day | ORAL | 0 refills | Status: DC
Start: 2022-04-30 — End: 2022-07-02
  Filled 2022-04-30: qty 30, 30d supply, fill #0

## 2022-04-30 MED ORDER — ALBUTEROL SULFATE HFA 108 (90 BASE) MCG/ACT IN AERS *I*
1.0000 | INHALATION_SPRAY | RESPIRATORY_TRACT | 3 refills | Status: AC | PRN
Start: 2022-04-30 — End: ?
  Filled 2022-04-30: qty 18, 33d supply, fill #0

## 2022-05-01 ENCOUNTER — Other Ambulatory Visit: Payer: Self-pay

## 2022-05-01 ENCOUNTER — Encounter: Payer: Self-pay | Admitting: Gastroenterology

## 2022-05-02 ENCOUNTER — Telehealth: Payer: Self-pay

## 2022-05-02 NOTE — Telephone Encounter (Signed)
Derek Walker calling to check on the status of his referral.  I advised that we have received his referral and it will be reviewed by our NN.  I advised he will most likely hear back from [place] by the end of next week.

## 2022-05-13 ENCOUNTER — Encounter: Payer: Self-pay | Admitting: Emergency Medicine

## 2022-05-13 ENCOUNTER — Telehealth: Payer: Self-pay

## 2022-05-13 ENCOUNTER — Emergency Department
Admission: EM | Admit: 2022-05-13 | Discharge: 2022-05-13 | Disposition: A | Discharge status: Home / Self Care | Payer: Medicaid Other | Source: Ambulatory Visit | Attending: Emergency Medicine | Admitting: Emergency Medicine

## 2022-05-13 DIAGNOSIS — K4091 Unilateral inguinal hernia, without obstruction or gangrene, recurrent: Secondary | ICD-10-CM | POA: Insufficient documentation

## 2022-05-13 LAB — COMPREHENSIVE METABOLIC PANEL
ALT: 20 U/L (ref 0–50)
AST: 31 U/L (ref 0–50)
Albumin: 4.4 g/dL (ref 3.5–5.2)
Alk Phos: 68 U/L (ref 40–130)
Anion Gap: 12 (ref 7–16)
Bilirubin,Total: 0.4 mg/dL (ref 0.0–1.2)
CO2: 26 mmol/L (ref 20–28)
Calcium: 9.8 mg/dL (ref 8.6–10.2)
Chloride: 102 mmol/L (ref 96–108)
Creatinine: 0.7 mg/dL (ref 0.67–1.17)
Glucose: 77 mg/dL (ref 60–99)
Lab: 7 mg/dL (ref 6–20)
Potassium: 4.3 mmol/L (ref 3.3–5.1)
Sodium: 140 mmol/L (ref 133–145)
Total Protein: 8 g/dL — ABNORMAL HIGH (ref 6.3–7.7)
eGFR BY CREAT: 115 *

## 2022-05-13 LAB — CBC AND DIFFERENTIAL
Baso # K/uL: 0 10*3/uL (ref 0.0–0.1)
Basophil %: 0.6 %
Eos # K/uL: 0.1 10*3/uL (ref 0.0–0.5)
Eosinophil %: 1.4 %
Hematocrit: 46 % (ref 40–51)
Hemoglobin: 15.4 g/dL (ref 13.7–17.5)
IMM Granulocytes #: 0 10*3/uL (ref 0.0–0.0)
IMM Granulocytes: 0.2 %
Lymph # K/uL: 1 10*3/uL — ABNORMAL LOW (ref 1.3–3.6)
Lymphocyte %: 19.9 %
MCH: 32 pg (ref 26–32)
MCHC: 34 g/dL (ref 32–37)
MCV: 94 fL — ABNORMAL HIGH (ref 79–92)
Mono # K/uL: 0.8 10*3/uL (ref 0.3–0.8)
Monocyte %: 15.1 %
Neut # K/uL: 3.2 10*3/uL (ref 1.8–5.4)
Nucl RBC # K/uL: 0 10*3/uL (ref 0.0–0.0)
Nucl RBC %: 0 /100 WBC (ref 0.0–0.2)
Platelets: 283 10*3/uL (ref 150–330)
RBC: 4.9 MIL/uL (ref 4.6–6.1)
RDW: 11.9 % (ref 11.6–14.4)
Seg Neut %: 62.8 %
WBC: 5 10*3/uL (ref 4.2–9.1)

## 2022-05-13 LAB — HOLD GRAY

## 2022-05-13 LAB — HOLD GREEN NO GEL

## 2022-05-13 LAB — LIPASE: Lipase: 22 U/L (ref 13–60)

## 2022-05-13 LAB — HOLD BLUE

## 2022-05-13 MED ORDER — KETOROLAC TROMETHAMINE 30 MG/ML IJ SOLN *I*
15.0000 mg | Freq: Once | INTRAMUSCULAR | Status: AC
Start: 2022-05-13 — End: 2022-05-13
  Administered 2022-05-13: 15 mg via INTRAVENOUS
  Filled 2022-05-13: qty 1

## 2022-05-13 MED ORDER — ONDANSETRON HCL 2 MG/ML IV SOLN *I*
4.0000 mg | Freq: Once | INTRAMUSCULAR | Status: AC
Start: 2022-05-13 — End: 2022-05-13
  Administered 2022-05-13: 4 mg via INTRAVENOUS
  Filled 2022-05-13: qty 2

## 2022-05-13 MED ORDER — SODIUM CHLORIDE 0.9 % IV BOLUS *I*
1000.0000 mL | Freq: Once | Status: AC
Start: 2022-05-13 — End: 2022-05-13
  Administered 2022-05-13: 1000 mL via INTRAVENOUS

## 2022-05-13 MED ORDER — DEXTROSE 5 % FLUSH FOR PUMPS *I*
0.0000 mL/h | INTRAVENOUS | Status: DC | PRN
Start: 2022-05-13 — End: 2022-05-13

## 2022-05-13 MED ORDER — SODIUM CHLORIDE 0.9 % FLUSH FOR PUMPS *I*
0.0000 mL/h | INTRAVENOUS | Status: DC | PRN
Start: 2022-05-13 — End: 2022-05-13

## 2022-05-13 NOTE — Telephone Encounter (Signed)
05/13/2022-ED Transition; Brief chart review. Arrived to ED with c/o Hernia.  Pt was evaluated and discharged.      In-basket routed to scheduling; Please call pt and offer to schedule FUV for 3-5 days after 05/13/2022 ED visit.    Venetia Maxon, RN   Medicine in Psychiatry   [phone]  [place], [place] [address]  [phone]

## 2022-05-13 NOTE — Discharge Instructions (Addendum)
You were seen here today due to pain at your inguinal hernia site and description of decreased bowel movements and urination.  Your vital signs are well within normal limits and you have good skin tone and turgor and moist mucous membranes and your lab work does not show any evidence of dehydration.  I was able to easily reduce the hernia with minimal pressure.  This clinical picture does not suggest any incarceration or strangulation of the hernia, which means that it does not need any immediate surgical intervention.  Follow-up closely with your surgeon and/or primary care physician regarding your symptoms to plan for reevaluation and definitive repair if needed.  In the meantime, I have given you a handout on inguinal hernias to use for further instructions.    Return to the emergency department if:   You have severe abdominal pain with nausea and vomiting.     Your abdomen is larger than usual.     Your hernia gets bigger or is purple or blue.     You see blood in your bowel movements.    You feel weak, dizzy, or faint.    Contact your healthcare provider if:   You have a fever.    You have questions or concerns about your condition or care.

## 2022-05-13 NOTE — ED Triage Notes (Signed)
Pt c/o RLQ abdominal pain d/t hernia - has surgery scheduled to get it removed on Thursday. Pt states he has not been able to urinate (last time this morning), to have BM (x3days) or eat (last 24h).   Denies any other c/o.    Blood Glucose Meter (mg/dl): 161  Prehospital medications given: No

## 2022-05-13 NOTE — ED Notes (Signed)
Pt safe to discharge at this time. IV removed. VSS, A/Ox4, ambulatory. AVS reviewed, pt verbalized understanding with no further questions. Safe ride home by medicab
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
## 2022-05-16 ENCOUNTER — Encounter: Payer: Self-pay | Admitting: Emergency Medicine

## 2022-06-13 NOTE — Progress Notes (Signed)
Review of Systems   Constitutional: Negative.  Negative for chills, fever and weight loss.   HENT: Negative.  Negative for sore throat.    Respiratory: Negative.  Negative for cough and shortness of breath.    Cardiovascular: Negative.  Negative for chest pain.   Gastrointestinal: Positive for abdominal pain. Negative for blood in stool, nausea and vomiting.   Genitourinary: Negative.  Negative for hematuria.   Musculoskeletal: Negative.  Negative for falls.   Neurological: Negative.  Negative for dizziness and headaches.   Endo/Heme/Allergies: Does not bruise/bleed easily.   Psychiatric/Behavioral: Negative.  Negative for depression.       Last mammogram (if indicated):  Last colonoscopy:  Never    Winferd Humphrey, LPN
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
## 2022-06-13 NOTE — Progress Notes (Signed)
History and physical as per Hans Eden, NP    History: Patient 46 year old gentleman first noticed a small bulge in his right groin in June of this year.  He has had several bouts of discomfort in pain while sitting and prolonged walking.  He is evaluated both the emergency department at [facility] in [facility] on 02/27/2022 and again on 05/13/2022 for his groin pain the hernia has enlarged and is now the size of an orange.  He is able to manually reduce it but it bulges out shortly thereafter.  He does have some intermittent constipation and diarrhea.  He does not have any imaging.  He now comes in for further evaluation discussion of treatment options.    Complete history and physical performed    Focused examination reveals a middle-aged gentleman.  Abdomen is soft.  He has an obvious reducible hernia in the right inguinal canal.  Soft tissue bulging is present on inspection.  He has not cough impulse.  The left inguinal canal is intact without bulging or cough impulse.    Radiographic, laboratory, and office notes reviewed    Assessment:  Symptomatic right inguinal hernia  History of HIV on retroviral medication.    Plan:    Treatment options and alternatives were reviewed with the patient.  We talked about observation as well as operative repair with mesh.    Literature was provided.  Questions were answered.  Risks and benefits were reviewed.  Specifically but not limited to bleeding, infection, scar, recurrence, chronic pain, and DVT.    Patient like to proceed with operative repair.  I will make arrangements to do so in the near future and keep you informed as to his progress    Lyndle Herrlich, MD      Time spent: 21 minutes    I have independently reviewed the history and physical exam as documented by Hans Eden, NP.  I personally saw the patient and performed a substantive portion of the visit including all aspects of the medical decision making.    Lyndle Herrlich,  MD

## 2022-06-17 ENCOUNTER — Telehealth: Payer: Self-pay

## 2022-06-17 NOTE — Telephone Encounter (Signed)
[facility] Social Work Note:    SW LVM for patient to see if he needs any assistance with setting up transportation for his upcoming CPM appointment along with his upcoming surgery. SW will remain available.     Charma Igo, LMSW, CASAC-T  [facility]  [phone]

## 2022-06-24 NOTE — Anesthesia Preprocedure Evaluation (Addendum)
Anesthesia Pre-operative History and Physical for Derek Walker    Highlighted Issues for this Procedure:  46 y.o. male with Non-recurrent unilateral inguinal hernia without obstruction or gangrene [K40.90] presenting for Procedure(s) (LRB):  RIGHT REPAIR, HERNIA, INGUINAL, wWITH MESH RIGHT/ULTRASOUND BLOCK (Right) by Surgeon(s):  Lyndle Herrlich, MD scheduled for 120 minutes.  BMI Readings from Last 1 Encounters:  07/03/22 : 20.50 kg/m      Last Cocaine on Sunday 06/30/2022 as per patient, Active smoker    Electrophysiology/AICD/Pacer:  EKG 07/02/22  Sinus rhythm rate 77  ST elev,, probably normal early repol pattern     .  CPM/PAT Summary:  BOISE FEHRINGER presents preoperatively for anesthesia evaluation prior to RIGHT REPAIR, HERNIA, INGUINAL, WITH MESH RIGHT/ULTRASOUND BLOCK with Dr. Elijah Birk at [facility] on 07/03/22 with GA on Jul 03, 2022  at [facility] Main OR by Dr Adalberto Cole    He has a Past Medical History:  BMI: 20.4  Non-recurrent unilateral inguinal hernia without obstruction or gangrene  Asthma- well controlled  GERD  HIV- follows with Dr Elodia Florence, last seen 04/30/22 ([place] [facility])  Alcohol abuse- last ED visit 2019, history of withdrawal seizures  Anxiety/ Depression/PTSD  Cocaine abuse- last used 10/15-discussed possibility of toxicity screen DOS and risk of cancellation     The patient has no dyspnea, denies chest pain, is moderately active, climbs stairs, and can lie flat.  He is active walking and doing household chores without any cardiopulmonary complaints. He denies any personal or family complications with anesthesia.                      Perioperative Cardiac Risk:  0.4%-1.0%CPM Chart Review onlyBy Cory Roughen, PA at 9:18 AM on 07/02/2022    Anesthesia Evaluation Information Source: patient, records     ANESTHESIA HISTORY  Pertinent(-):  No History of anesthetic complications or Family hx of anesthetic complications    GENERAL    + Substance abuse          cocaine  Comment: Patient denies any  wounds, rashes or open sores at time of pre-op.   Pertinent (-):  No obesity, infection, history of anesthetic complications or Family Hx of Anesthetic Complications    HEENT    + Visual Impairment          corrective lens for ADL  Pertinent (-):  No hearing loss, TMJD, sinus issues or neck pain PULMONARY    + Smoker          tobacco currently    + Asthma (uses albuterol less than once a month-weather related)    + Snoring  Pertinent(-):  No shortness of breath, cough/congestion, sleep apnea (never had sleep study) or COPD    CARDIOVASCULAR  Good(4+METs) Exercise Tolerance  Pertinent(-):  No hypertension, past MI, cardiac testing, angina, anticoagulants/antiplatelet medications, dysrhythmias, orthopnea or hx of DVT    GI/HEPATIC/RENAL   NPO: > 8hrs ago (solids) and > 2hrs ago (clears)      + GERD          well controlled    + Hx of PUD (years ago)    + Alcohol use          social  Pertinent(-):  No liver  issues, bowel issues, renal issues, urinary issues or  esophageal issues  NEURO/PSYCH/ORTHO    + Neuropsychiatric Issues          depression, anxiety, PTSD  Pertinent(-):  No headaches, dizziness/motion sickness, syncope, seizures,  cerebrovascular event, spinal cord injury/defect or gait/mobility issues    ENDO/OTHER  Pertinent(-):  No diabetes mellitus, thyroid disease, hormone use, steroid use, chemo Hx    HEMATOLOGIC  Pertinent(-):  No bruising/bleeding easily, anticoagulants/antiplatelet medications, blood transfusion, blood dyscrasia or arthritis    Comment: HIV diease- follows with Dr Elodia Florence, last labs 12/2021, on BIKTARVY         Physical Exam    Airway            Mouth opening: normal            Mallampati: II            TM distance (fb): >3 FB            Neck ROM: full  Dental   Normal Exam   Comment: Missing multiple molars   Denies any loose or broken teeth    Cardiovascular           Rhythm: regular           Rate: normal  No peripheral edema or murmur         Pulmonary     breath sounds clear to  auscultation    No cough, rhonchi    Mental Status     oriented to person, place and time     Patient Education from CPM/PAT Provider:  Patient Education:  The following items were discussed with Derek Walker to his satisfaction and comprehension:  The facility's NPO guidelines were discussed  To call the surgeon if he becomes ill prior to surgery  All questions were answered  Medications DOS with sip of water: As per AVS  Hold medications AM day of surgery: As per AVS  Nurse reviewed additional items as indicated in the education record.  No barriers to learning identified.  IV insertion was reviewed with him.  The importance of coughing and deep breathing was emphasized.  Derek Walker was instructed to hold ASA/NSAIDS 5 days before surgery.          ________________________________________________________________________  PLAN  ASA Score  2  Anesthetic Plan general       Induction (routine IV) General Anesthesia/Sedation Maintenance Plan (propofol infusion and IV bolus); Airway (nasal cannula, mask and LMA); Line ( use current access); Monitoring (standard ASA); Positioning (supine); PONV Plan (dexamethasone, ondansetron and haloperidol); Pain (per surgical team, nerve block and intraop local); PostOp (PACU)Standard Attestation  Informed Consent     Risks:          Risks discussed were commensurate with the plan listed above with the following specific points: N/V, aspiration, sore throat, hypotension, failed block and dizziness, Damage to: teeth and eyes, allergic Rx.    Anesthetic Consent:         Anesthetic plan (and risks as noted above) were discussed with patient           Comment:Consent for spinal anesthesia is obtained    Plan also discussed with team members including:       attending    Responsible Anesthesia Provider Attestation:  I attest that the patient or proxy understands and accepts the risks and benefits of the anesthesia plan. I also attest that I have personally performed a pre-anesthetic  examination and evaluation, and prescribed the anesthetic plan for this particular location within 48 hours prior to the anesthetic as documented. Damoney Julia Eben Burow, MD  07/03/22, 2:00 PM

## 2022-06-24 NOTE — Discharge Instructions (Signed)
Pre-Operative Instructions                     FOLLOW YOUR SURGEON'S INSTRUCTIONS IF DIFFERENT THAN BELOW.       FIVE DAYS PRIOR TO SURGERY  Stop taking any non-steroidal anti-inflammatory agents (such as Ibuprofen, Advil, Motrin, Aleve, or Naproxen) for five days prior to surgery (if directed differently in medication chart on previous page please follow those instructions).    ARRIVAL/SURGICAL TIME  Staff from the [facility] will call you between 1:30 PM and 4 PM on the day before surgery to inform you of your arrival and surgery times. This call will be Friday afternoon if your surgery is on a Monday.  Please arrange for transportation to and from the hospital. You must have a responsible adult to stay with you after your surgery if you are going home the same day.  Please know that you should not drive for 24 hours after receiving anesthesia.    DAY BEFORE SURGERY  Keep yourself well hydrated to aid in the placement of your IV and for your general well-being.  Do not eat anything after midnight the night before your surgery (including candy or gum).                                                                                         DAY OF SURGERY  DO NOT CONSUME FOOD OF ANY KIND.    Only Gatorade, clear apple juice or water from midnight until 2 hours before your scheduled surgery.    Please bring photo identification and your insurance card with you for registration.    Please wear loose, comfortable clothing and comfortable walking shoes.  Wear something that will easily accommodate a bandage, cast or other type of dressing at your surgical site.    DO NOT WEAR: HEAVY MAKEUP, DARK NAIL POLISH, HAIR PINS, BODY LOTION OR SCENTS.      Please understand that rings and body piercings need to be removed and left at home.  If they are not removed, your surgery is at risk of being delayed or cancelled.    Valuables such as jewelry, cash and credit cards are best left at home during your stay.  Please  send them home with family members.  If you are alone a staff member will assist you in storing your belongings.    We  ask that your cell phone be with you and turned on, so that the Preop and Operating Room nurses may phone you directly with instructions on the morning of surgery if necessary, and you can be in communication with your family.    If wearing eyeglasses, please bring a case.  DO NOT WEAR CONTACT LENSES.    You may shower, brush your teeth, and use deodorant.    PLEASE BRING YOUR CPAP MASK and TUBING INTO THE PREOPERATIVE AREA IF YOU ARE STAYING OVERNIGHT.     Patient visitation is restricted at this time due to COVID-19 concerns.       MEDICATIONS: DAY OF SURGERY  Take your medications as directed according to your printed, verbal or MyChart instructions.    Please leave your prescriptions at home with the exception of inhalers.      AT THE HOSPITAL ON THE DAY OF SURGERY  Park in the Main Ramp garage.  Enter the building through the Main Lobby.     Please stop at the Information Desk for directions to the Surgery Center on [address].    Leave your belongings in the car (except for your CPAP MASK and TUBING if staying overnight.) Your visitors may bring them to your room after surgery.    [facility]  The pharmacy is open from 9am to 5:30pm on weekdays and 10am-2pm on Saturdays.     Any prescriptions you may need after your surgery can be filled at the Outpatient Pharmacy in the main lobby:    Pharmacy staff will coordinate obtaining your insurance information and co-payments, which will be identical to those of your home pharmacy.  A credit card can be called in to the pharmacy in advance and stored securely with  encryption, to be used only for medications prescribed for you.  Please have your support person call the pharmacy with a credit card number during your surgery to expedite your discharge from the hospital.  Cash or a check are acceptable forms of payment as well,  and payment can be facilitated at the time of discharge by staff members accompanying you to your car.  Prescriptions cannot be filled without payment. Thank you for your consideration.    QUESTIONS?  Please call the [facility] at [phone] between 8 AM and 4 PM Monday through Friday.   Any questions regarding specifics about your surgery or recovery? Please call your surgeon's office.

## 2022-06-25 ENCOUNTER — Ambulatory Visit
Admission: RE | Admit: 2022-06-25 | Discharge: 2022-06-25 | Disposition: A | Discharge status: Home / Self Care | Payer: Medicaid Other | Source: Ambulatory Visit

## 2022-06-25 NOTE — Discharge Instructions (Signed)
Pre-Operative Instructions                     FOLLOW YOUR SURGEON'S INSTRUCTIONS IF DIFFERENT THAN BELOW.       FIVE DAYS PRIOR TO SURGERY  Stop taking any non-steroidal anti-inflammatory agents (such as Ibuprofen, Advil, Motrin, Aleve, or Naproxen) for five days prior to surgery (if directed differently in medication chart on previous page please follow those instructions).    ARRIVAL/SURGICAL TIME  Staff from the [facility] will call you between 1:30 PM and 4 PM on the day before surgery to inform you of your arrival and surgery times. This call will be Friday afternoon if your surgery is on a Monday.  Please arrange for transportation to and from the hospital. You must have a responsible adult to stay with you after your surgery if you are going home the same day.  Please know that you should not drive for 24 hours after receiving anesthesia.    DAY BEFORE SURGERY  Keep yourself well hydrated to aid in the placement of your IV and for your general well-being.  Do not eat anything after midnight the night before your surgery (including candy or gum).                                                                                         DAY OF SURGERY  DO NOT CONSUME FOOD OF ANY KIND.    Only Gatorade, clear apple juice or water from midnight until 2 hours before your scheduled surgery.    Please bring photo identification and your insurance card with you for registration.    Please wear loose, comfortable clothing and comfortable walking shoes.  Wear something that will easily accommodate a bandage, cast or other type of dressing at your surgical site.    DO NOT WEAR: HEAVY MAKEUP, DARK NAIL POLISH, HAIR PINS, BODY LOTION OR SCENTS.      Please understand that rings and body piercings need to be removed and left at home.  If they are not removed, your surgery is at risk of being delayed or cancelled.    Valuables such as jewelry, cash and credit cards are best left at home during your stay.  Please  send them home with family members.  If you are alone a staff member will assist you in storing your belongings.    We  ask that your cell phone be with you and turned on, so that the Preop and Operating Room nurses may phone you directly with instructions on the morning of surgery if necessary, and you can be in communication with your family.    If wearing eyeglasses, please bring a case.  DO NOT WEAR CONTACT LENSES.    You may shower, brush your teeth, and use deodorant.    PLEASE BRING YOUR CPAP MASK and TUBING INTO THE PREOPERATIVE AREA IF YOU ARE STAYING OVERNIGHT.     Patient visitation is restricted at this time due to COVID-19 concerns.       MEDICATIONS: DAY OF SURGERY  Take your medications as directed according to your printed, verbal or MyChart instructions.    Please leave your prescriptions at home with the exception of inhalers.      AT THE HOSPITAL ON THE DAY OF SURGERY  Park in the Main Ramp garage.  Enter the building through the Main Lobby.     Please stop at the Information Desk for directions to the Surgery Center on [address].    Leave your belongings in the car (except for your CPAP MASK and TUBING if staying overnight.) Your visitors may bring them to your room after surgery.    [facility]  The pharmacy is open from 9am to 5:30pm on weekdays and 10am-2pm on Saturdays.     Any prescriptions you may need after your surgery can be filled at the Outpatient Pharmacy in the main lobby:    Pharmacy staff will coordinate obtaining your insurance information and co-payments, which will be identical to those of your home pharmacy.  A credit card can be called in to the pharmacy in advance and stored securely with  encryption, to be used only for medications prescribed for you.  Please have your support person call the pharmacy with a credit card number during your surgery to expedite your discharge from the hospital.  Cash or a check are acceptable forms of payment as well,  and payment can be facilitated at the time of discharge by staff members accompanying you to your car.  Prescriptions cannot be filled without payment. Thank you for your consideration.    QUESTIONS?  Please call the [facility] at [phone] between 8 AM and 4 PM Monday through Friday.   Any questions regarding specifics about your surgery or recovery? Please call your surgeon's office.

## 2022-06-26 ENCOUNTER — Ambulatory Visit
Admission: RE | Admit: 2022-06-26 | Discharge: 2022-06-26 | Disposition: A | Discharge status: Home / Self Care | Payer: Medicaid Other | Source: Ambulatory Visit

## 2022-06-27 NOTE — Discharge Instructions (Signed)
Pre-Operative Instructions                     FOLLOW YOUR SURGEON'S INSTRUCTIONS IF DIFFERENT THAN BELOW.       FIVE DAYS PRIOR TO SURGERY  Stop taking any non-steroidal anti-inflammatory agents (such as Ibuprofen, Advil, Motrin, Aleve, or Naproxen) for five days prior to surgery (if directed differently in medication chart on previous page please follow those instructions).    ARRIVAL/SURGICAL TIME  Staff from the [facility] will call you between 1:30 PM and 4 PM on the day before surgery to inform you of your arrival and surgery times. This call will be Friday afternoon if your surgery is on a Monday.  Please arrange for transportation to and from the hospital. You must have a responsible adult to stay with you after your surgery if you are going home the same day.  Please know that you should not drive for 24 hours after receiving anesthesia.    DAY BEFORE SURGERY  Keep yourself well hydrated to aid in the placement of your IV and for your general well-being.  Do not eat anything after midnight the night before your surgery (including candy or gum).                                                                                         DAY OF SURGERY  DO NOT CONSUME FOOD OF ANY KIND.    Only Gatorade, clear apple juice or water from midnight until 2 hours before your scheduled surgery.    Please bring photo identification and your insurance card with you for registration.    Please wear loose, comfortable clothing and comfortable walking shoes.  Wear something that will easily accommodate a bandage, cast or other type of dressing at your surgical site.    DO NOT WEAR: HEAVY MAKEUP, DARK NAIL POLISH, HAIR PINS, BODY LOTION OR SCENTS.      Please understand that rings and body piercings need to be removed and left at home.  If they are not removed, your surgery is at risk of being delayed or cancelled.    Valuables such as jewelry, cash and credit cards are best left at home during your stay.  Please  send them home with family members.  If you are alone a staff member will assist you in storing your belongings.    We  ask that your cell phone be with you and turned on, so that the Preop and Operating Room nurses may phone you directly with instructions on the morning of surgery if necessary, and you can be in communication with your family.    If wearing eyeglasses, please bring a case.  DO NOT WEAR CONTACT LENSES.    You may shower, brush your teeth, and use deodorant.    PLEASE BRING YOUR CPAP MASK and TUBING INTO THE PREOPERATIVE AREA IF YOU ARE STAYING OVERNIGHT.     Patient visitation is restricted at this time due to COVID-19 concerns.       MEDICATIONS: DAY OF SURGERY  Take your medications as directed according to your printed, verbal or MyChart instructions.    Please leave your prescriptions at home with the exception of inhalers.      AT THE HOSPITAL ON THE DAY OF SURGERY  Park in the Main Ramp garage.  Enter the building through the Main Lobby.     Please stop at the Information Desk for directions to the Surgery Center on [address].    Leave your belongings in the car (except for your CPAP MASK and TUBING if staying overnight.) Your visitors may bring them to your room after surgery.    [facility]  The pharmacy is open from 9am to 5:30pm on weekdays and 10am-2pm on Saturdays.     Any prescriptions you may need after your surgery can be filled at the Outpatient Pharmacy in the main lobby:    Pharmacy staff will coordinate obtaining your insurance information and co-payments, which will be identical to those of your home pharmacy.  A credit card can be called in to the pharmacy in advance and stored securely with  encryption, to be used only for medications prescribed for you.  Please have your support person call the pharmacy with a credit card number during your surgery to expedite your discharge from the hospital.  Cash or a check are acceptable forms of payment as well,  and payment can be facilitated at the time of discharge by staff members accompanying you to your car.  Prescriptions cannot be filled without payment. Thank you for your consideration.    QUESTIONS?  Please call the [facility] at [phone] between 8 AM and 4 PM Monday through Friday.   Any questions regarding specifics about your surgery or recovery? Please call your surgeon's office.

## 2022-06-28 ENCOUNTER — Ambulatory Visit
Admission: RE | Admit: 2022-06-28 | Discharge: 2022-06-28 | Disposition: A | Discharge status: Home / Self Care | Payer: Medicaid Other | Source: Ambulatory Visit

## 2022-06-28 NOTE — Discharge Instructions (Signed)
Pre-Operative Instructions                     FOLLOW YOUR SURGEON'S INSTRUCTIONS IF DIFFERENT THAN BELOW.       FIVE DAYS PRIOR TO SURGERY  Stop taking any non-steroidal anti-inflammatory agents (such as Ibuprofen, Advil, Motrin, Aleve, or Naproxen) for five days prior to surgery (if directed differently in medication chart on previous page please follow those instructions).    ARRIVAL/SURGICAL TIME  Staff from the  Surgery Center will call you between 1:30 PM and 4 PM on the day before surgery to inform you of your arrival and surgery times. This call will be Friday afternoon if your surgery is on a Monday.  Please arrange for transportation to and from the hospital. You must have a responsible adult to stay with you after your surgery if you are going home the same day.  Please know that you should not drive for 24 hours after receiving anesthesia.    DAY BEFORE SURGERY  Keep yourself well hydrated to aid in the placement of your IV and for your general well-being.  Do not eat anything after midnight the night before your surgery (including candy or gum).                                                                                         DAY OF SURGERY  DO NOT CONSUME FOOD OF ANY KIND.    Only Gatorade, clear apple juice or water from midnight until 2 hours before your scheduled surgery.    Please bring photo identification and your insurance card with you for registration.    Please wear loose, comfortable clothing and comfortable walking shoes.  Wear something that will easily accommodate a bandage, cast or other type of dressing at your surgical site.    DO NOT WEAR: HEAVY MAKEUP, DARK NAIL POLISH, HAIR PINS, BODY LOTION OR SCENTS.      Please understand that rings and body piercings need to be removed and left at home.  If they are not removed, your surgery is at risk of being delayed or cancelled.    Valuables such as jewelry, cash and credit cards are best left at home during your stay.  Please  send them home with family members.  If you are alone a staff member will assist you in storing your belongings.    We  ask that your cell phone be with you and turned on, so that the Preop and Operating Room nurses may phone you directly with instructions on the morning of surgery if necessary, and you can be in communication with your family.    If wearing eyeglasses, please bring a case.  DO NOT WEAR CONTACT LENSES.    You may shower, brush your teeth, and use deodorant.    PLEASE BRING YOUR CPAP MASK and TUBING INTO THE PREOPERATIVE AREA IF YOU ARE STAYING OVERNIGHT.     Patient visitation is restricted at this time due to COVID-19 concerns.       MEDICATIONS: DAY OF SURGERY  Take your medications as directed according to your printed, verbal or MyChart instructions.    Please leave your prescriptions at home with the exception of inhalers.      AT THE HOSPITAL ON THE DAY OF SURGERY  Park in the Main Ramp garage.  Enter the building through the Main Lobby.     Please stop at the Information Desk for directions to the Surgery Center on [address].    Leave your belongings in the car (except for your CPAP MASK and TUBING if staying overnight.) Your visitors may bring them to your room after surgery.     [facility]  The pharmacy is open from 9am to 5:30pm on weekdays and 10am-2pm on Saturdays.     Any prescriptions you may need after your surgery can be filled at the Outpatient Pharmacy in the main lobby:    Pharmacy staff will coordinate obtaining your insurance information and co-payments, which will be identical to those of your home pharmacy.  A credit card can be called in to the pharmacy in advance and stored securely with  encryption, to be used only for medications prescribed for you.  Please have your support person call the pharmacy with a credit card number during your surgery to expedite your discharge from the hospital.  Cash or a check are acceptable forms of payment as well,  and payment can be facilitated at the time of discharge by staff members accompanying you to your car.  Prescriptions cannot be filled without payment. Thank you for your consideration.    QUESTIONS?  Please call the [facility] at [phone] between 8 AM and 4 PM Monday through Friday.   Any questions regarding specifics about your surgery or recovery? Please call your surgeon's office.

## 2022-07-01 NOTE — Progress Notes (Signed)
[facility] Social Work Note:    SW was notified of patient's previous 3 no shows to CPM appointment. SW called and spoke with patient to see if SW could be of any assistance. Patient reports that his case manager, Bronson Ing, through [employer] [place], set up transportation each time but transportation never showed.     SW called [facility] [phone]) and spoke with [facility]. She confirmed that transportation has been set up for patient for CPM appointment at 8:30am on 10/17.     SW will remain available.    Charma Igo, LMSW, CASAC-T  [facility]  [phone]

## 2022-07-02 ENCOUNTER — Ambulatory Visit
Admission: RE | Admit: 2022-07-02 | Discharge: 2022-07-02 | Disposition: A | Discharge status: Home / Self Care | Payer: Medicaid Other | Source: Ambulatory Visit | Attending: Surgery | Admitting: Surgery

## 2022-07-02 ENCOUNTER — Other Ambulatory Visit: Payer: Self-pay

## 2022-07-02 VITALS — BP 103/70 | HR 89 | Temp 98.4°F | Resp 18 | Ht 70.0 in | Wt 142.1 lb

## 2022-07-02 DIAGNOSIS — K4090 Unilateral inguinal hernia, without obstruction or gangrene, not specified as recurrent: Secondary | ICD-10-CM | POA: Insufficient documentation

## 2022-07-02 HISTORY — DX: Unilateral inguinal hernia, without obstruction or gangrene, not specified as recurrent: K40.90

## 2022-07-02 LAB — CBC
Hematocrit: 42 % (ref 37–52)
Hemoglobin: 13.9 g/dL (ref 12.0–17.0)
MCH: 31 pg (ref 26–32)
MCHC: 33 g/dL (ref 32–37)
MCV: 94 fL (ref 75–100)
Platelets: 233 10*3/uL (ref 150–450)
RBC: 4.5 MIL/uL (ref 4.0–6.0)
RDW: 11.9 % (ref 0.0–15.0)
WBC: 5.1 10*3/uL (ref 3.5–11.0)

## 2022-07-02 LAB — POTASSIUM: Potassium: 3.8 mmol/L (ref 3.3–5.1)

## 2022-07-03 ENCOUNTER — Other Ambulatory Visit: Payer: Self-pay

## 2022-07-03 ENCOUNTER — Ambulatory Visit
Admission: RE | Admit: 2022-07-03 | Discharge: 2022-07-03 | Disposition: A | Discharge status: Home / Self Care | Payer: Medicaid Other | Source: Ambulatory Visit | Attending: Surgery | Admitting: Surgery

## 2022-07-03 ENCOUNTER — Ambulatory Visit: Payer: Medicaid Other

## 2022-07-03 ENCOUNTER — Encounter
Admission: RE | Disposition: A | Discharge status: Home / Self Care | Payer: Self-pay | Source: Ambulatory Visit | Attending: Surgery

## 2022-07-03 ENCOUNTER — Ambulatory Visit: Payer: Medicaid Other | Admitting: Anesthesiology

## 2022-07-03 DIAGNOSIS — K4090 Unilateral inguinal hernia, without obstruction or gangrene, not specified as recurrent: Secondary | ICD-10-CM

## 2022-07-03 DIAGNOSIS — F17210 Nicotine dependence, cigarettes, uncomplicated: Secondary | ICD-10-CM | POA: Insufficient documentation

## 2022-07-03 DIAGNOSIS — G8918 Other acute postprocedural pain: Secondary | ICD-10-CM

## 2022-07-03 DIAGNOSIS — F1410 Cocaine abuse, uncomplicated: Secondary | ICD-10-CM | POA: Insufficient documentation

## 2022-07-03 DIAGNOSIS — K219 Gastro-esophageal reflux disease without esophagitis: Secondary | ICD-10-CM | POA: Insufficient documentation

## 2022-07-03 DIAGNOSIS — Z789 Other specified health status: Secondary | ICD-10-CM

## 2022-07-03 DIAGNOSIS — I213 ST elevation (STEMI) myocardial infarction of unspecified site: Secondary | ICD-10-CM

## 2022-07-03 DIAGNOSIS — B20 Human immunodeficiency virus [HIV] disease: Secondary | ICD-10-CM | POA: Insufficient documentation

## 2022-07-03 DIAGNOSIS — J45909 Unspecified asthma, uncomplicated: Secondary | ICD-10-CM | POA: Insufficient documentation

## 2022-07-03 HISTORY — PX: PR RPR 1ST INGUN HRNA AGE 5 YRS/> REDUCIBLE: 49505

## 2022-07-03 SURGERY — REPAIR, HERNIA, INGUINAL
Anesthesia: General | Site: Abdomen | Laterality: Right | Wound class: Clean

## 2022-07-03 MED ORDER — OXYCODONE HCL 5 MG/5ML PO SOLN *I*
5.0000 mg | Freq: Once | ORAL | Status: AC | PRN
Start: 2022-07-03 — End: 2022-07-03
  Administered 2022-07-03: 5 mg via ORAL
  Filled 2022-07-03: qty 5

## 2022-07-03 MED ORDER — GLYCOPYRROLATE 0.2 MG/ML IJ SOLN *WRAPPED*
INTRAMUSCULAR | Status: AC
Start: 2022-07-03 — End: 2022-07-03
  Filled 2022-07-03: qty 2

## 2022-07-03 MED ORDER — ACETAMINOPHEN 500 MG PO TABS *I*
1000.0000 mg | ORAL_TABLET | Freq: Three times a day (TID) | ORAL | 0 refills | Status: AC | PRN
Start: 2022-07-03 — End: 2022-07-10
  Filled 2022-07-03: qty 42, 7d supply, fill #0

## 2022-07-03 MED ORDER — GABAPENTIN 300 MG PO CAPSULE *I*
300.0000 mg | ORAL_CAPSULE | Freq: Once | ORAL | Status: AC
Start: 2022-07-03 — End: 2022-07-03
  Administered 2022-07-03: 300 mg via ORAL
  Filled 2022-07-03: qty 1

## 2022-07-03 MED ORDER — GLYCOPYRROLATE 0.2 MG/ML IJ SOLN *WRAPPED*
INTRAMUSCULAR | Status: DC | PRN
  Administered 2022-07-03: .2 mg via INTRAVENOUS

## 2022-07-03 MED ORDER — IBUPROFEN 600 MG PO TABS *I*
600.0000 mg | ORAL_TABLET | Freq: Once | ORAL | Status: AC
Start: 2022-07-03 — End: 2022-07-03
  Administered 2022-07-03: 600 mg via ORAL
  Filled 2022-07-03: qty 1

## 2022-07-03 MED ORDER — HEPARIN SODIUM 5000 UNIT/ML SQ *I*
5000.0000 [IU] | Freq: Once | SUBCUTANEOUS | Status: AC
Start: 2022-07-03 — End: 2022-07-03
  Administered 2022-07-03: 5000 [IU] via SUBCUTANEOUS
  Filled 2022-07-03: qty 1

## 2022-07-03 MED ORDER — SODIUM CHLORIDE 0.9 % IV SOLN WRAPPED *I*
20.0000 mL/h | Status: DC
Start: 2022-07-03 — End: 2022-07-04

## 2022-07-03 MED ORDER — ONDANSETRON HCL 2 MG/ML IV SOLN *I*
INTRAMUSCULAR | Status: DC | PRN
Start: 2022-07-03 — End: 2022-07-03
  Administered 2022-07-03: 4 mg via INTRAVENOUS

## 2022-07-03 MED ORDER — DEXAMETHASONE SODIUM PHOSPHATE 4 MG/ML INJ SOLN *WRAPPED*
INTRAMUSCULAR | Status: DC | PRN
Start: 2022-07-03 — End: 2022-07-03
  Administered 2022-07-03: 8 mg via INTRAVENOUS

## 2022-07-03 MED ORDER — ACETAMINOPHEN 325 MG PO TABS *I*
650.0000 mg | ORAL_TABLET | Freq: Once | ORAL | Status: AC
Start: 2022-07-03 — End: 2022-07-03
  Administered 2022-07-03: 650 mg via ORAL
  Filled 2022-07-03: qty 2

## 2022-07-03 MED ORDER — ONDANSETRON HCL 2 MG/ML IV SOLN *I*
4.0000 mg | Freq: Once | INTRAMUSCULAR | Status: DC | PRN
Start: 2022-07-03 — End: 2022-07-03

## 2022-07-03 MED ORDER — DEXAMETHASONE SODIUM PHOSPHATE 4 MG/ML INJ SOLN *WRAPPED*
INTRAMUSCULAR | Status: AC
Start: 2022-07-03 — End: 2022-07-03
  Filled 2022-07-03: qty 2

## 2022-07-03 MED ORDER — PROPOFOL 10 MG/ML IV EMUL (INTERMITTENT DOSING) WRAPPED *I*
INTRAVENOUS | Status: AC
Start: 2022-07-03 — End: 2022-07-03
  Filled 2022-07-03: qty 40

## 2022-07-03 MED ORDER — IBUPROFEN 600 MG PO TABS *I*
600.0000 mg | ORAL_TABLET | Freq: Four times a day (QID) | ORAL | 0 refills | Status: AC | PRN
Start: 2022-07-03 — End: 2022-07-10
  Filled 2022-07-03: qty 28, 7d supply, fill #0

## 2022-07-03 MED ORDER — EPINEPHRINE 1 MG/ML IJ SOLUTION WRAPPED *I*
INTRAMUSCULAR | Status: AC
Start: 2022-07-03 — End: 2022-07-03
  Filled 2022-07-03: qty 1

## 2022-07-03 MED ORDER — FAMOTIDINE 20 MG PO TABS *I*
20.0000 mg | ORAL_TABLET | Freq: Once | ORAL | Status: AC
Start: 2022-07-03 — End: 2022-07-03
  Administered 2022-07-03: 20 mg via ORAL
  Filled 2022-07-03: qty 1

## 2022-07-03 MED ORDER — OXYCODONE HCL 5 MG/5ML PO SOLN *I*
10.0000 mg | Freq: Once | ORAL | Status: AC | PRN
Start: 2022-07-03 — End: 2022-07-03

## 2022-07-03 MED ORDER — LIDOCAINE HCL (PF) 1 % IJ SOLN *I*
0.1000 mL | INTRAMUSCULAR | Status: DC | PRN
Start: 2022-07-03 — End: 2022-07-04
  Administered 2022-07-03: 0.1 mL via SUBCUTANEOUS

## 2022-07-03 MED ORDER — LIDOCAINE HCL 1 % IJ SOLN *I*
INTRAMUSCULAR | Status: DC | PRN
Start: 2022-07-03 — End: 2022-07-03
  Administered 2022-07-03: 10 mL

## 2022-07-03 MED ORDER — HYDRALAZINE HCL 20 MG/ML IJ SOLN *I*
INTRAMUSCULAR | Status: DC | PRN
Start: 2022-07-03 — End: 2022-07-03
  Administered 2022-07-03: 10 mg via INTRAVENOUS
  Administered 2022-07-03: 5 mg via INTRAVENOUS

## 2022-07-03 MED ORDER — ALBUTEROL SULFATE (2.5 MG/3ML) 0.083% IN NEBU *I*
2.5000 mg | INHALATION_SOLUTION | Freq: Once | RESPIRATORY_TRACT | Status: DC | PRN
Start: 2022-07-03 — End: 2022-07-03

## 2022-07-03 MED ORDER — PROPOFOL INFUSION 10 MG/ML *I*
INTRAVENOUS | Status: DC | PRN
Start: 2022-07-03 — End: 2022-07-03
  Administered 2022-07-03: 75 ug/kg/min via INTRAVENOUS
  Administered 2022-07-03: 120 ug/kg/min via INTRAVENOUS
  Administered 2022-07-03: 150 ug/kg/min via INTRAVENOUS

## 2022-07-03 MED ORDER — HYDRALAZINE HCL 20 MG/ML IJ SOLN *I*
INTRAMUSCULAR | Status: AC
Start: 2022-07-03 — End: 2022-07-03
  Filled 2022-07-03: qty 1

## 2022-07-03 MED ORDER — LIDOCAINE HCL 1% IJ SOLN *WRAPPED* (FOR LINE PLACEMENT ONLY)
0.1000 mL | INTRAMUSCULAR | Status: DC | PRN
Start: 2022-07-03 — End: 2022-07-04

## 2022-07-03 MED ORDER — LACTATED RINGERS IV SOLN *I*
20.0000 mL/h | INTRAVENOUS | Status: DC
Start: 2022-07-03 — End: 2022-07-04
  Administered 2022-07-03: 20 mL/h via INTRAVENOUS

## 2022-07-03 MED ORDER — ONDANSETRON HCL 2 MG/ML IV SOLN *I*
INTRAMUSCULAR | Status: AC
Start: 2022-07-03 — End: 2022-07-03
  Filled 2022-07-03: qty 2

## 2022-07-03 MED ORDER — MIDAZOLAM HCL 1 MG/ML IJ SOLN *I* WRAPPED
INTRAMUSCULAR | Status: DC | PRN
Start: 2022-07-03 — End: 2022-07-03
  Administered 2022-07-03: 2 mg via INTRAVENOUS

## 2022-07-03 MED ORDER — LACTATED RINGERS IV SOLN *I*
20.0000 mL/h | INTRAVENOUS | Status: DC
Start: 2022-07-03 — End: 2022-07-04

## 2022-07-03 MED ORDER — PROPOFOL 10 MG/ML IV EMUL (INTERMITTENT DOSING) WRAPPED *I*
INTRAVENOUS | Status: AC
Start: 2022-07-03 — End: 2022-07-03
  Filled 2022-07-03: qty 20

## 2022-07-03 MED ORDER — MIDAZOLAM HCL 1 MG/ML IJ SOLN *I* WRAPPED
INTRAMUSCULAR | Status: AC
Start: 2022-07-03 — End: 2022-07-03
  Filled 2022-07-03: qty 2

## 2022-07-03 MED ORDER — ZZ DO NOT USE 3
Status: DC | PRN
Start: 2022-07-03 — End: 2022-07-03

## 2022-07-03 MED ORDER — LIDOCAINE HCL 1 % IJ SOLN *I*
INTRAMUSCULAR | Status: DC | PRN
Start: 2022-07-03 — End: 2022-07-03
  Administered 2022-07-03: 3 mL via SUBCUTANEOUS

## 2022-07-03 MED ORDER — LACTATED RINGERS IV SOLN *I*
125.0000 mL/h | INTRAVENOUS | Status: DC
Start: 2022-07-03 — End: 2022-07-03
  Administered 2022-07-03: 125 mL/h via INTRAVENOUS

## 2022-07-03 MED ORDER — BUPIVACAINE HCL 0.25% IJ SOLN (FOR OSM) *I*
Status: DC | PRN
Start: 2022-07-03 — End: 2022-07-03
  Administered 2022-07-03: 15 mL via PERINEURAL

## 2022-07-03 MED ORDER — CEFAZOLIN 2000 MG IN STERILE WATER 20ML SYRINGE *I*
2000.0000 mg | PREFILLED_SYRINGE | Freq: Once | INTRAVENOUS | Status: AC
Start: 2022-07-03 — End: 2022-07-03
  Administered 2022-07-03: 2000 mg via INTRAVENOUS
  Filled 2022-07-03: qty 20

## 2022-07-03 MED ORDER — BUPIVACAINE HCL 0.25 % IJ SOLUTION *WRAPPED*
Status: AC
Start: 2022-07-03 — End: 2022-07-03
  Filled 2022-07-03: qty 30

## 2022-07-03 SURGICAL SUPPLY — 27 items
DRAIN SURGICAL L18IN DIA0.25IN SILL SOFT FLEX KINK RESIST PENROSE RADPQ (Supply) ×1 IMPLANT
FILTER NEPTUNE 4PORT MANIFOLD (Supply) ×1 IMPLANT
GLOVE BIOGEL PI MICRO IND UNDER SZ 6.5 LF (Glove) ×1 IMPLANT
GLOVE BIOGEL PI MICRO IND UNDER SZ 7.0 LF (Glove) ×1 IMPLANT
GLOVE BIOGEL PI MICRO IND UNDER SZ 7.5 LF (Glove) ×1 IMPLANT
GLOVE BIOGEL PI MICRO SZ 6 (Glove) ×1 IMPLANT
GLOVE BIOGEL PI MICRO SZ 7 (Glove) ×1 IMPLANT
GLOVE BIOGEL PI MICRO SZ 7.5 (Glove) ×2 IMPLANT
GLOVE SURG BIOGEL PI ULTRATOUCH SZ 7.5 (Glove) ×1 IMPLANT
MESH SUR L PTCH ONLAY W6XL12CM UNDERLAY 10CM HERN SYS ULTRAPRO (Implant) ×1 IMPLANT
NEEDLE HYPO REG BVL TW IM 23G X 1.5IN (Needle) ×1 IMPLANT
NEEDLE ULTRAPLEX 360 ECHOGENIC 22G X 3 1/8IN (Needle) IMPLANT
PACK CUSTOM GENERAL PACK (Pack) ×1 IMPLANT
PENCIL SMOKE EVAC COATED PB (Supply) ×1 IMPLANT
PREP CHG DYNA-HEX LIQUID 4PCT 4OZ (Supply) ×1 IMPLANT
SLEEVE COMP KNEE MED BLENDED (Supply) ×1 IMPLANT
SOL SODIUM CHLORIDE IRRIG 1000ML BTL (Solution) ×1 IMPLANT
SOL WATER IRRIG STERILE 1000ML BTL (Solution) ×1 IMPLANT
SPONGE SUR W0.25XL9/16IN WHT COT RND KTNR DISECT RADPQ DISP (Supply) IMPLANT
SPONGE X-RAY 4 X 4IN LF STER (Sponge) ×1 IMPLANT
SUTR MONCRYL 3-0 PS-2 UND 27IN (Suture) ×1 IMPLANT
SUTR PROLENE MONO 2-0 CT-2 BLUE (Suture) ×2 IMPLANT
SUTR VICRYL ANTIB 2-0 CT-1 36 VIOLET (Suture) ×1 IMPLANT
SUTR VICRYL ANTIB 3-0 SH 27 UNDY (Suture) ×4 IMPLANT
SUTR VICRYL CTD 3-0 VIL LIGAPAK VIOLET (Suture) ×1 IMPLANT
SYRINGE LUERLOCK 30ML INDIVIDUAL WRAP (Syringe) ×1 IMPLANT
TRAY SCRUB DRY SKIN INCLUDES 6 WINGED SPONGES 6 SPONGE STICKS 2 COTTON TIP APPLICATORS ABSORBENT/BLOTTING TOWELS PREMIUM (Supply) ×1 IMPLANT

## 2022-07-03 NOTE — Progress Notes (Signed)
Writer reviewed paperwork with patient and support person. All post-op milestones completed including eating, drinking, walking independently and patient is accepting of discharge at this time. PIV removed. Pt taken to front loop via wheelchair.

## 2022-07-03 NOTE — Anesthesia Procedure Notes (Signed)
----------------------------------------------------------------------------------------------------------------------------------------    Abdominal Wall Nerve Block      Date of Procedure: 07/03/2022 2:29 PM    Laterality:  Right    Injection Technique: Single-shot    Location Details:TAP()    Reason for Block: Post-op pain management and At Surgeon's request        Patient Location: OR  CONSENT AND TIMEOUT     Consent:  Obtained per policy    Timeout: patient identified (name/DOB) , proper patient position verified, needed equipment, monitors, medications and access verified as present and functioning , allergies reviewed with patient/record  and anticoagulation/antiplatelet status reviewed  METHOD     Patient Position:  Supine    Monitoring:  Blood pressure    Sedation Used:  Yes    Level of Sedation: Minimal              for meds injected see MAR portion of chart      Sterile Technique:  Hand sanitizing, hat, mask and sterile gloves    Prep:  Aseptic technique per protocol and Chloraprep      Approach:  In-plane and Medial    Technique: Ultrasound guided       Attempts (Skin Punctures):  1     Lot Number: [id number]; Expiration Date: 2027-02-14   NEEDLE     Type:  Short-bevel     Gauge: 22 G     Length: 8 cm  BLOCK EVENTS      No significant resistance to injection     No blood aspirated     No intravascular injection     Well Tolerated  STAFF     Performed by  Sydnee Cabal, MD  ----------------------------------------------------------------------------------------------------------------------------------------

## 2022-07-03 NOTE — Anesthesia Case Conclusion (Signed)
CASE CONCLUSION  Emergence  Actions:  Mask supported  Criteria Used for Airway Removal:  Acceptable O2 saturation  Assessment:  Routine  Transport  Directly to: PACU  Airway:  Nasal cannula  Oxygen Delivery:  2 lpm  Position:  Recumbent  Patient Condition on Handoff  Level of Consciousness:  Mildly sedated  Patient Condition:  Stable  Handoff Report to:  RN

## 2022-07-03 NOTE — INTERIM OP NOTE (Addendum)
Interim Op Note (Surgical Log ID: [id number])       Date of Surgery: 07/03/2022       Surgeons: Surgeon(s) and Role:     * Lyndle Herrlich, MD - Primary     * Jacqulyn Ducking, MD - Resident - Assisting   Assistants:         Pre-op Diagnosis: Pre-Op Diagnosis Codes:     * Non-recurrent unilateral inguinal hernia without obstruction or gangrene [K40.90]       Post-op Diagnosis: Post-Op Diagnosis Codes:     * Non-recurrent unilateral inguinal hernia without obstruction or gangrene [K40.90]       Procedure(s) Performed: Procedure(s) (LRB):  RIGHT REPAIR, HERNIA, INGUINAL, wWITH MESH RIGHT/ULTRASOUND BLOCK (Right)       Anesthesia Type: General        Fluid Totals: I/O this shift:  10/18 1500 - 10/18 2259  In: 700 (10.5 mL/kg) [I.V.:700]  Out: 25 (0.4 mL/kg) [Blood:25]  Net: 675  Weight: 66.7 kg        Estimated Blood Loss: 25 mL       Specimens to Pathology:  ID Type Source Tests Collected by Time Destination   A : Abdomen, Ileo-hypogastric nerve TISSUE Tissue SURGICAL PATHOLOGY Weyman Pedro, RN 07/03/2022 1453    B : Right inguinal hernia sac TISSUE Tissue SURGICAL PATHOLOGY Dolly Rias, RN 07/03/2022 1512           Temporary Implants:        Packing:                 Patient Condition: good       Findings (Including unexpected complications): Right indirect inguinal hernia repair with high ligation of the hernia sac and placement of mesh, uncomplicated.      Signed:  Jacqulyn Ducking, MD  on 07/03/2022 at 4:03 PM

## 2022-07-03 NOTE — Progress Notes (Signed)
I tried to walk patient with assistance from another nurse. Patient was unable to stand independently or get to recliner. Will continue to monitor and try to ambulate again in 30 minutes.

## 2022-07-03 NOTE — H&P (Signed)
UPDATES TO PATIENT'S CONDITION on the DAY OF SURGERY/PROCEDURE    I. Updates to Patient's Condition (to be completed by a provider privileged to complete a H&P, following reassessment of the patient by the provider):    Day of Surgery/Procedure Update:  History  History reviewed and no change    Physical  Physical exam updated and no change            II. Procedure Readiness   I have reviewed the patient's H&P and updated condition. By completing and signing this form, I attest that this patient is ready for surgery/procedure.    III. Attestation   I have reviewed the updated information regarding the patient's condition and it is appropriate to proceed with the planned surgery/procedure.      Lyndle Herrlich, MD as of 1:41 PM 07/03/2022

## 2022-07-03 NOTE — Anesthesia Postprocedure Evaluation (Signed)
Anesthesia Post-Op Note    Patient: Derek Walker    Procedure(s) Performed:  Procedure Summary  Date:  07/03/2022 Anesthesia Start: 07/03/2022  2:15 PM Anesthesia Stop: 07/03/2022  4:14 PM Room / Location:  H_OR_06 / [facility] MAIN OR   Procedure(s):  RIGHT REPAIR, HERNIA, INGUINAL, wWITH MESH RIGHT/ULTRASOUND BLOCK Diagnosis:  Non-recurrent unilateral inguinal hernia without obstruction or gangrene [K40.90] Surgeon(s):  Lyndle Herrlich, MD  Jacqulyn Ducking, MD Responsible Anesthesia Provider:  Sydnee Cabal, MD         Recovery Vitals  BP: 113/88 (07/03/2022  4:45 PM)  Heart Rate: 82 (07/03/2022  4:45 PM)  Heart Rate (via Pulse Ox): 82 (07/03/2022  4:45 PM)  Resp: 21 (07/03/2022  4:45 PM)  Temp: 37 C (98.6 F) (07/03/2022  4:45 PM)  SpO2: 100 % (07/03/2022  4:45 PM)  O2 Flow Rate: 4 L/min (07/03/2022  4:00 PM)   0-10 Scale: 0 (07/03/2022  4:45 PM)    Anesthesia type:  general  Complications Noted During Procedure or in PACU:  None   Comment:    Patient Location:  [facility]  Level of Consciousness:    Recovered to baseline  Patient Participation:     Able to participate  Temperature Status:    Normothermic  Oxygen Saturation:    Within patient's normal range  Cardiac Status:   within patient's normal range  Fluid Status:    Stable  Airway Patency:     Yes  Pulmonary Status:    Baseline  Pain Management:    Adequate analgesia  Nausea and Vomiting:  None    Post Op Assessment:    Tolerated procedure well  Responsible Anesthesia Provider Attestation:  All indicated post anesthesia care provided         Complications Noted During Recovery Period:      None  Recovery from Anesthesia:      Recovered to baseline level of consciousness  Condition of patient:      Recovered to pre-anesthetic condition and Satisfactory

## 2022-07-03 NOTE — Anesthesia Procedure Notes (Signed)
---------------------------------------------------------------------------------------------------------------------------------------    NEURAXIAL BLOCK PLACEMENT  Single Shot Spinal    Date of Procedure: 07/03/2022 2:23 PM    Patient Location:  OR    Reason for Block: at surgeon's request    CONSENT AND TIMEOUT     Consent:  Obtained per policy    Timeout: patient identified (name/DOB) , proper patient position verified, needed equipment, monitors, medications and access verified as present and functioning , allergies reviewed with patient/record  and anticoagulation/antiplatelet status reviewed  METHOD:    Patient Position: sitting    Monitoring: blood pressure and continuous pulse oximetry      Labeling: syringes appropriately labeled    Sedation Used: yes            For medications used, please see MAR        Level of Sedation: none      Sterile Technique:  Hand sanitizing and sterile gloves    Prep: aseptic technique per protocol and chlorhexidine+isopropanol      Successful Approach: midline    Successful Location: L3-4    Attempts (Skin Punctures):  1  SPINAL NEEDLE:      Introducer: 20 G    Type: Pencan    Gauge: 25 G    Length: 3.5 in    CSF Appearance: clear  OBSERVATIONS:    Block Completion:  Successfully completed      Paresthesia: none      Neuraxial Blood: blood not aspirated      Sensory Levels: bilateral      Peak Sensory Level: T10        Motor Block: dense      Patient Reaction to Block: tolerated procedure well    STAFF     Performed by  Sydnee Cabal, MD  ----------------------------------------------------------------------------------------------------------------------------------------

## 2022-07-03 NOTE — Anesthesia Procedure Notes (Signed)
---------------------------------------------------------------------------------------------------------------------------------------    AIRWAY   GENERAL INFORMATION AND STAFF    Patient location during procedure: OR       Date of Procedure: 07/03/2022 2:43 PM  CONDITION PRIOR TO MANIPULATION     Current Airway/Neck Condition:  Normal        For more airway physical exam details, see Anesthesia PreOp Evaluation  AIRWAY METHOD     Patient Position:  Sniffing    Preoxygenated: yes      Maintained In-Line Stability: yes          To see details of medications used, see MAR    Induction: IV    Mask Difficulty Assessment:  0 - not attempted    Number of Attempts at Approach:  1    Number of Other Approaches Attempted:  0  FINAL AIRWAY DETAILS    Final Airway Type:  Nasal cannula    Adjunct Airway: oropharyngeal airway (OPA)    OPA Size:     Head position required to avoid obstruction:  Neutral    Insertion Site:  Oral  ----------------------------------------------------------------------------------------------------------------------------------------

## 2022-07-03 NOTE — Discharge Instructions (Addendum)
[facility] Discharge Instructions    Date: 07/03/2022  Procedure: right indirect inguinal hernia repair  Physician: Lyndle Herrlich, MD    You have received sedative medication and/or general anesthesia which may make you drowsy for as long as 24 hours:  A) DO NOT drive or operate any machinery for 24 hours  B) DO NOT drink alcoholic beverages for 24 hours  C) DO NOT make major decisions, sign contracts, etc. for 24 hours    DIET - Resume your previous diet    PAIN MANAGEMENT - Per medication list below. Norco/Percocet contains tylenol (acetaminophen). Please do not take tylenol or tylenol containing products while on these medications to avoid tylenol overdose. Many over the counter medications contain acetaminophen. Do not exceed 4000 mg of tylenol (acetaminophen) per day.     CARE OF THE INCISION - After the surgery, the incisions will be covered with a Topical Skin Adhesive. The film will usually remain in place for 5 - 10 days then naturally fall off your skin.  Do not scratch, rub, or pick at the adhesive skin film.  This may loosen the film before your wound is healed.  If you are required to place a dressing over the incision, do not place tape over the skin adhesive since this may remove the skin adhesive. The stitches are underneath the skin and will dissolve.  You may shower tonight. Do not scrub the incision.  Just let the water run over it and then gently pat it dry. Do not submerge the incisions for 1 week (no baths, hot tubs, or swimming pools) or as directed by your physician.      COMFORT MEASURES - For the first few days, it is common for the area around the incision to be swollen, discolored (black & blue), and sore.  To help reduce swelling, apply an ice pack or bag of frozen peas to the swollen area for 15 to 20 minutes every hour for 3 days or more as needed.  Wear loose, comfortable clothing.  We recommend the use of over-the-counter anti-inflammatory medication such as  ibuprofen (Advil, Motrin, Aleve) to minimize swelling, inflammation, and pain.  Take medication every 4 - 6 hours with food.  If over-the-counter medications are ineffective, a narcotic pain medication prescribed by your doctor may be used in addition.  Be aware that some prescription pain medication can cause constipation, nausea, or vomiting.    ACTIVITY- Lifting should be restricted to 10 pounds or less for 2 weeks and 20 pounds or less for an additional 4 weeks following your surgery or as instructed by the physician.  Avoid pushing, pulling, straining, or any strenuous activity.   You may climb stairs.  You may drive if not using narcotic pain medication and if you are comfortable enough behind the wheel and can safely operate the vehicle.    DRIVING - You may drive when you are not using any prescription pain medication and when you are able to react normally.    RETURN TO WORK - You may return to work when you feel you are ready (usually 1 - 2 weeks.)  If your job involves heavy lifting, straining, or manual labor you will be out for 6 weeks.  If your job involves the above, you may be able to return to work sooner with restrictions as indicated by your doctor.      DRIVING - You may drive when you are not using any prescription pain medication and when you  are able to react normally.    FOLLOW-UP OFFICE VISIT - You will be seen in the office 2-3 weeks following your surgery.  You will be given this appointment when the date of your surgery has been set and it will be documented in the surgical paperwork mailed to you.    WHEN TO CALL THE OFFICE - Do not hesitate to call the office if you develop a fever (temperature greater than 101), shaking chills, a large amount of swelling or bruising (some scrotal swelling and bruising is common with inguinal hernia repairs), bleeding, increasing redness or drainage from your incision, trouble urinating, persistent or increased pain, nausea/vomiting, or with any other  problem that concerns you.    Call Dr. Lyndle Herrlich, MD  at [phone] with questions/concerns.     Author: Jacqulyn Ducking, MD  as of: 07/03/2022  at: 2:10 PM

## 2022-07-04 ENCOUNTER — Encounter: Payer: Self-pay | Admitting: Surgery

## 2022-07-04 LAB — EKG 12-LEAD
P: 68 deg
P: 78 deg
PR: 166 ms
PR: 149 ms
QRS: 67 deg
QRS: 71 deg
QRSD: 74 ms
QRSD: 80 ms
QT: 380 ms
QT: 378 ms
QTc: 430 ms
QTc: 425 ms
Rate: 77 {beats}/min
Rate: 76 {beats}/min
T: 56 deg
T: 71 deg

## 2022-07-04 NOTE — Op Note (Unsigned)
[name], [name] MR #:  [id number]   CSN:  [id number] DOB:  1975/11/03    AGE:  46     SURGEON:  Derek Herrlich, MD  CO-SURGEON:    ASSISTANT:  Jacqulyn Ducking, R1  SURGERY DATE:  07/03/2022    PREOPERATIVE DIAGNOSIS:  Right inguinal hernia.    POSTOPERATIVE DIAGNOSIS:  Right inguinal hernia, indirect.    OPERATIVE PROCEDURE:  Repair of indirect right inguinal hernia with large Ultrapro hernia system.    ANESTHESIA:  General.    ANESTHESIOLOGIST:  Derek Eben Burow, MD    HISTORY:  The patient is a 46 year old gentleman who presents with a symptomatic right inguinal hernia.  After discussion of treatment options, he presents for operative repair.    FINDINGS:  The ilioinguinal and iliohypogastric nerve were a common nerve.  The iliohypogastric portion was divided and the ilioinguinal portion preserved.  The patient had an indirect component with moderate inflammatory reaction around it.  There was no direct component.  The hernia sac and nerve were sent for specimens.    ESTIMATED BLOOD LOSS:  25 mL.    DRAINS:  None.    COMPLICATIONS:  None apparent.    CONDITION OF PATIENT:  Satisfactory.    DESCRIPTION OF PROCEDURE:  The patient was taken to the operating room and placed on the operating table in the supine position.  Identified as Derek Walker.  The operative site and procedure were verified by the patient and validated by the operative team.  Venodyne boots were installed.  Perioperative antibiotics were infused.  After undergoing his blocks, his right groin hairs were clipped.  He was then prepped with Hibiclens and draped in sterile fashion.  Surgical time-out was held to reconfirm the patient's name, intended procedure, and the right side.  Local anesthesia was infiltrated along the line of the proposed right groin incision.  The right groin incision was made sharply with a knife, carried down with the cautery.  Hemostasis was secured with cautery clamps and Vicryl ties.  The external oblique was  cleared and incised in direction of its fibers through the external ring.  The inguinal canal was exposed.  The ilioinguinal nerve was traced and had a common trunk with the iliohypogastric, which was divided.  The ilioinguinal nerve was then retracted inferiorly and the cord was then encircled around the pubic tubercle and then *** lines.  No direct component was noted.  The anterior medial portion of the cord was bared and then the hernia sac was identified and then skeletonized out with a combination of sharp and blunt dissection.  Hemostasis was secured with cautery.  Once the hernia sac was fully mobilized up to the peritoneum, the hernia sac was clamped, divided and suture ligated and allowed to retract.  The sac was sent for pathology as was iliohypogastric nerve trunk.  The internal spermatic artery on the right side was clamped, divided and ligated.  Then the properitoneal space was entered just lateral to the inferior epigastric vessels and the properitoneal space cleaved with blunt dissection.  A large Ultrapro hernia system was chosen.  The inlay portion was inset and fanned out.  Once this laid well, the onlay portion was unfurled.  A new internal ring was fashioned in the 6 o'clock axis by incising it with scissors and then wrapping it around the cord.  This was then overlapped and fixed with a 2-0 Prolene stitch x2.  The mesh was then tacked down to the pubic  tubercle and along the shelving edge and by the inguinal ligament.  The transversus abdominis aponeurotic arch was tightened up to tighten up the internal ring.  This was with 3-0 Vicryl.  3-0 Vicryl was then used to tack the onlay mesh down just lateral to the internal ring.  The mesh was trimmed in the superior medial portion.  This was then tacked down to the floor of the inguinal canal.  A pleat was taken in the 12 o'clock axis and secured with 3-0 Vicryl.  The inguinal canal contents were placed back into their original position.  The  external oblique was closed with a running 3-0 Vicryl, anchoring the mesh laterally.  This was then tied.  The subcutaneous tissue was lavaged with saline and Scarpa's was then closed with 2-0 Vicryl, 3-0 Vicryl, deep dermal, 3-0 Monocryl as a subcuticular stitch followed by skin glue.  The testicle was re-seated.  The patient was awoken from his anesthesia and transported to Recovery in satisfactory condition.  Sponge, needle, and instrument counts were reported as correct x2.  Then, the patient's mom, Derek Walker, was contacted by phone and apprised of operative findings, procedure performed, and patient condition.             ______________________________  Derek Herrlich, MD    CBC/MODL  DD:  07/03/2022 16:10:41  DT:  07/03/2022 17:30:34  Job #:  [id number]    cc:

## 2022-07-08 LAB — SURGICAL PATHOLOGY

## 2023-02-18 ENCOUNTER — Other Ambulatory Visit: Payer: Self-pay

## 2023-02-19 ENCOUNTER — Other Ambulatory Visit: Payer: Self-pay

## 2023-02-19 MED ORDER — BIKTARVY 50-200-25 MG PO TABS
1.0000 | ORAL_TABLET | Freq: Every day | ORAL | 5 refills | Status: AC
Start: 2023-02-18 — End: ?
  Filled 2023-02-19: qty 30, 30d supply, fill #0

## 2023-02-19 MED ORDER — SULFAMETHOXAZOLE-TRIMETHOPRIM 800-160 MG PO TABS *I*
1.0000 | ORAL_TABLET | Freq: Two times a day (BID) | ORAL | 0 refills | Status: AC
Start: 2023-02-18 — End: 2023-02-24
  Filled 2023-02-19: qty 12, 6d supply, fill #0

## 2023-02-19 MED ORDER — FLUCONAZOLE 100 MG PO TABS *I*
100.0000 mg | ORAL_TABLET | Freq: Every day | ORAL | 0 refills | Status: AC
Start: 2023-02-18 — End: 2023-02-28
  Filled 2023-02-19: qty 10, 10d supply, fill #0

## 2023-02-21 ENCOUNTER — Other Ambulatory Visit: Payer: Self-pay

## 2023-02-26 ENCOUNTER — Other Ambulatory Visit: Payer: Self-pay

## 2023-03-03 ENCOUNTER — Other Ambulatory Visit: Payer: Self-pay

## 2023-04-10 IMAGING — CT CT HEAD WO/W CM
4 of 6 series · 15 of 35 positions shown, 17 images · IV contrast (omnipaque)
Comparison: None.

CLINICAL DATA: Initial evaluation for acute headache.

EXAM:
CT HEAD WITHOUT AND WITH CONTRAST
TECHNIQUE: Contiguous axial images were obtained from the base of the skull
through the vertex without and with intravenous contrast
CONTRAST:  100mL OMNIPAQUE IOHEXOL 300 MG/ML  SOLN

[Series 4: ax head wo · axial · 0.38mm/px · z∈[+916,+1012]mm · 5 of 31 slices shown, 7 images (1 of 2)]
[im 6/31  soft-tissue]
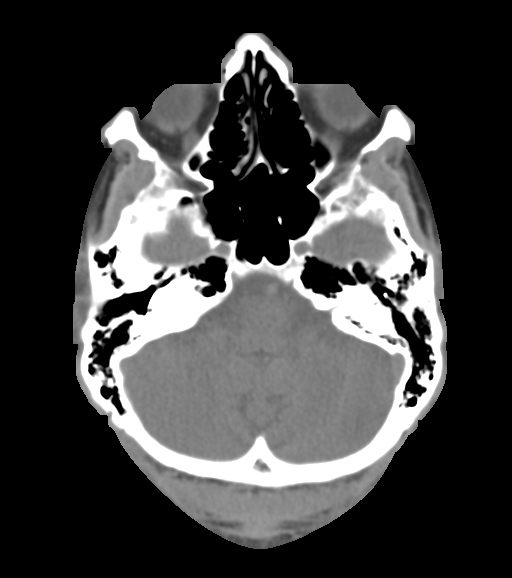
[im 6/31  bone]
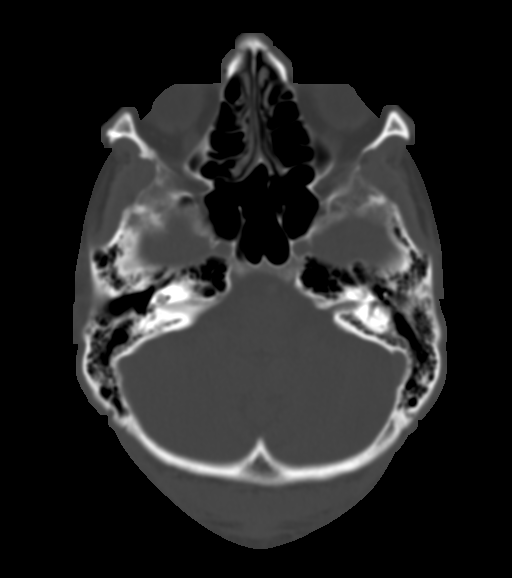
[im 11/31  bone]
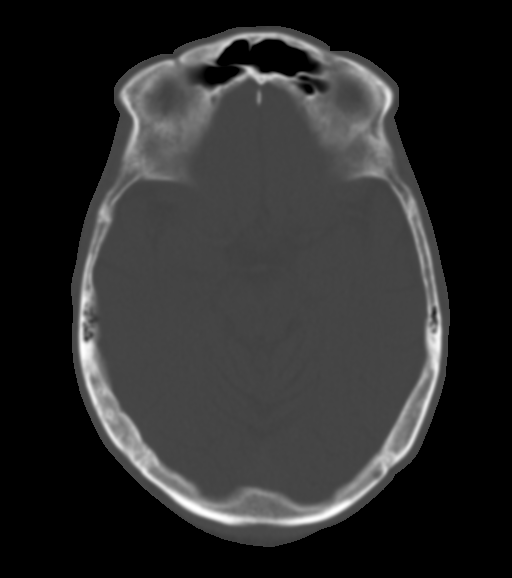
[im 16/31  bone]
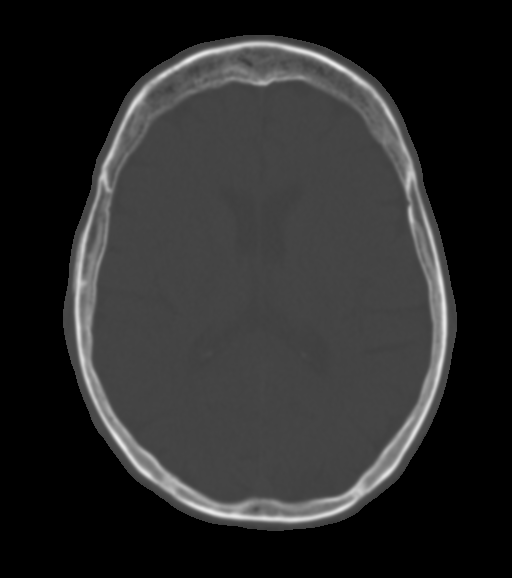
[im 21/31  bone]
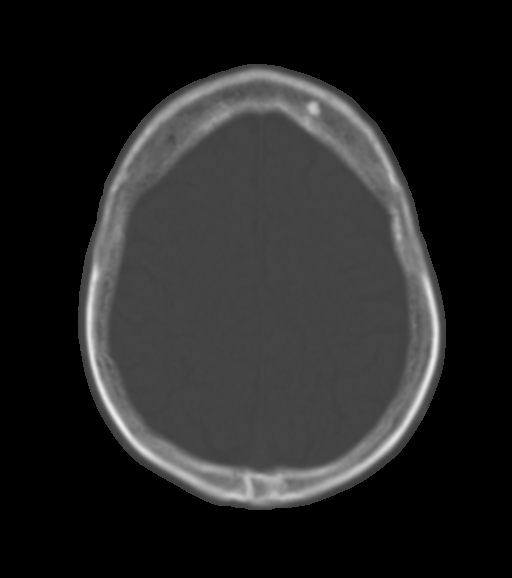
[im 26/31  soft-tissue]
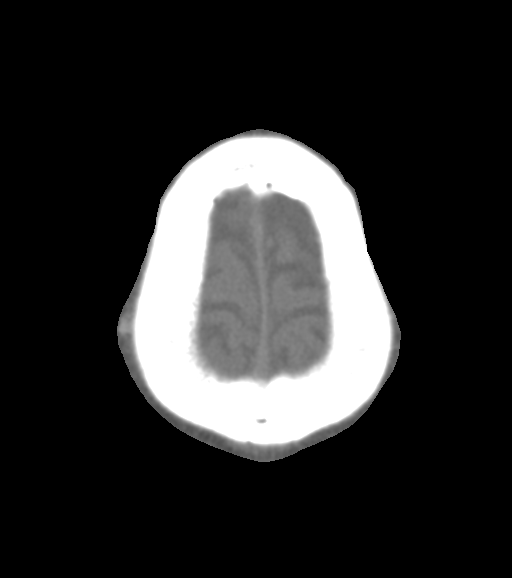
[im 26/31  bone]
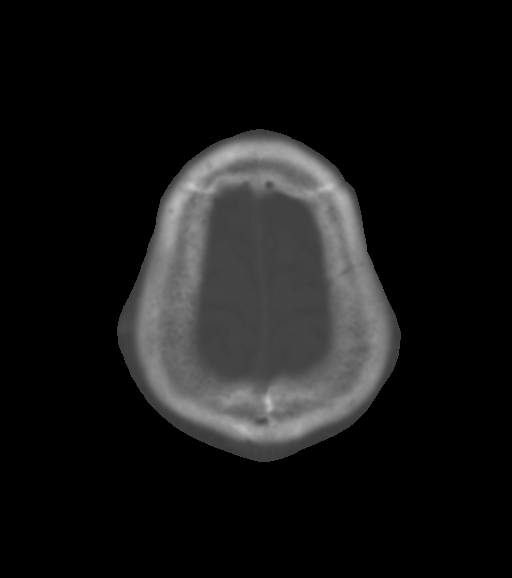

[Series 6: sag soft · sagittal · 0.37mm/px · 6 of 61 slices shown]
[im 10/61  soft-tissue]
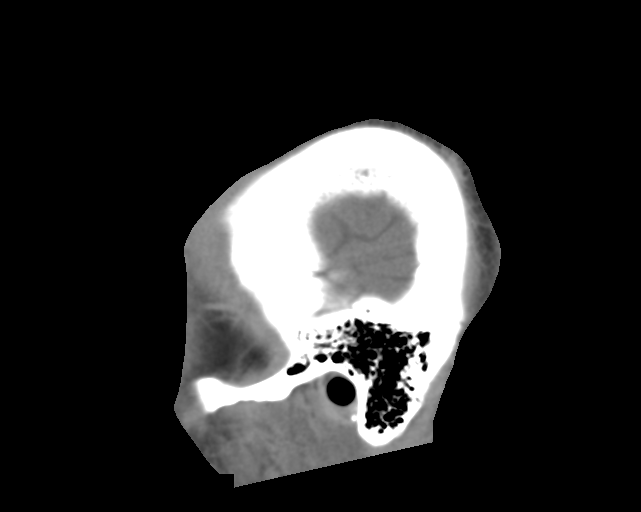
[im 11/61  bone]
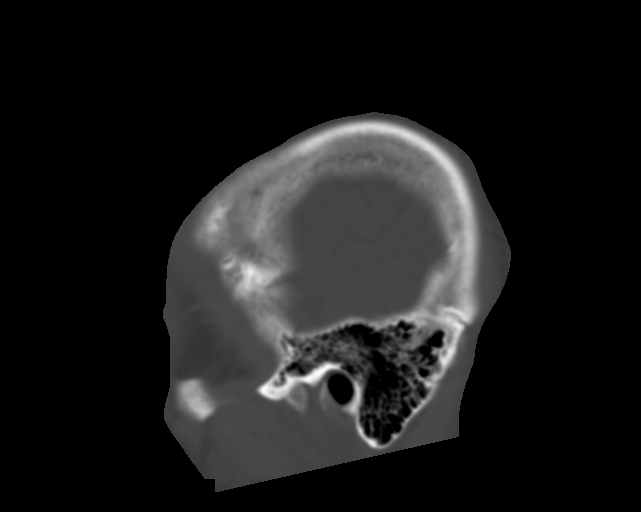
[im 21/61  bone]
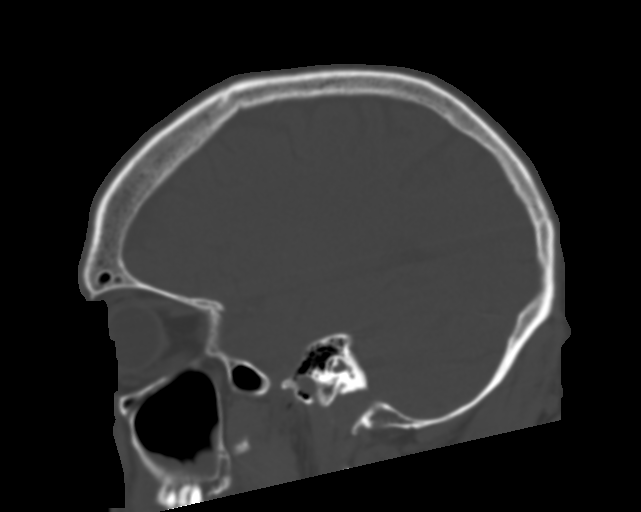
[im 31/61  bone]
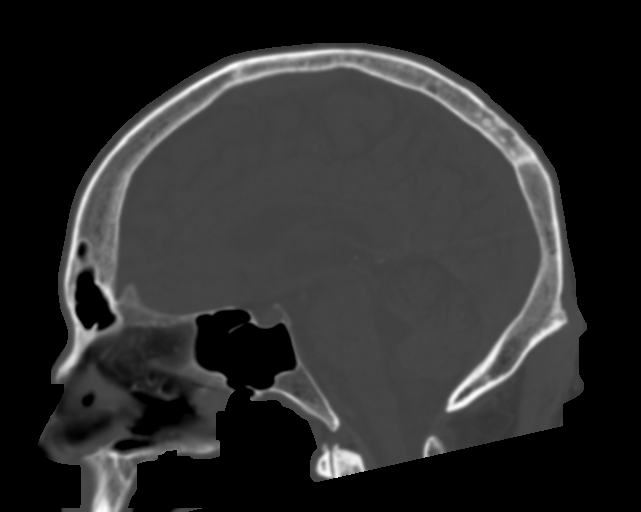
[im 41/61  bone]
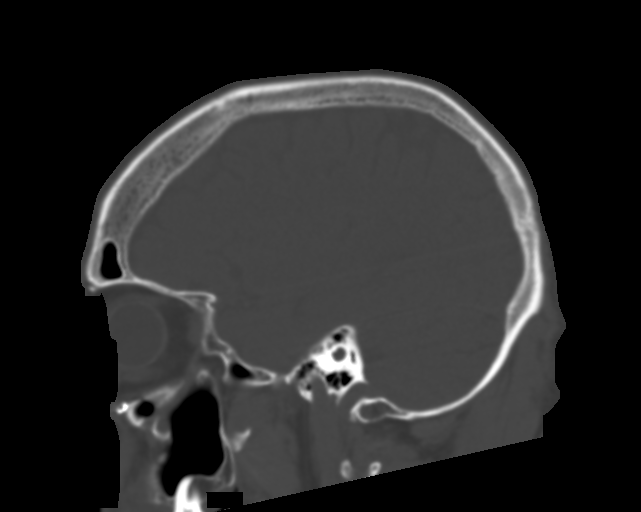
[im 51/61  bone]
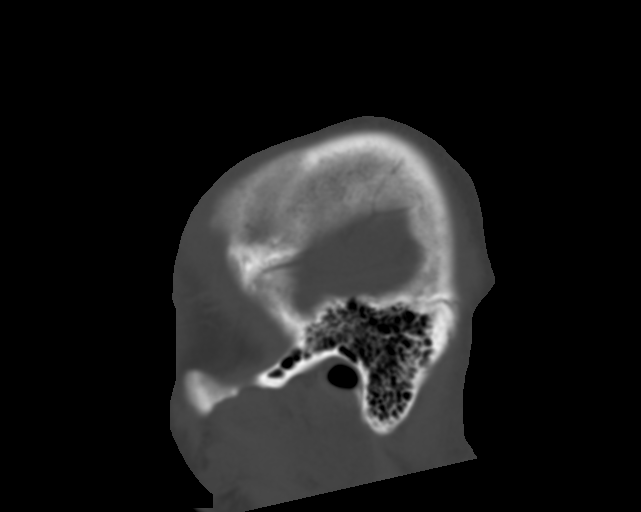

[Series 7: coronal soft · coronal · 0.36mm/px · 2 of 71 slices shown]
[im 2/71  bone]
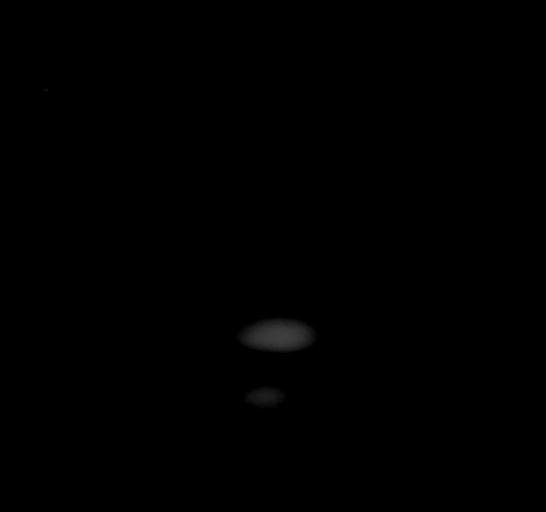
[im 36/71  bone]
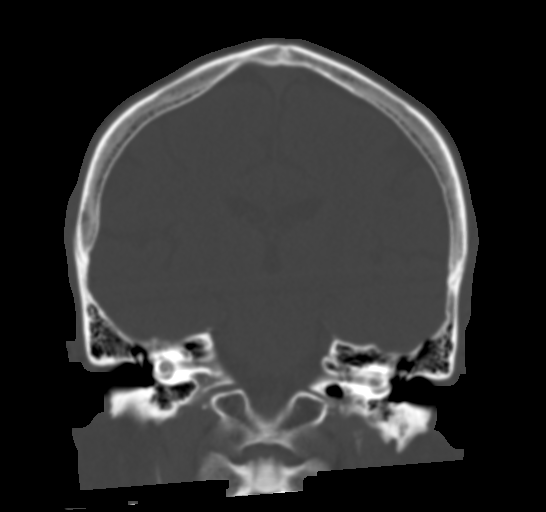

[Series 9: ax head wo · axial · 0.40mm/px · z∈[+913,+937]mm · 2 of 32 slices shown (2 of 2)]
[im 6/32  bone]
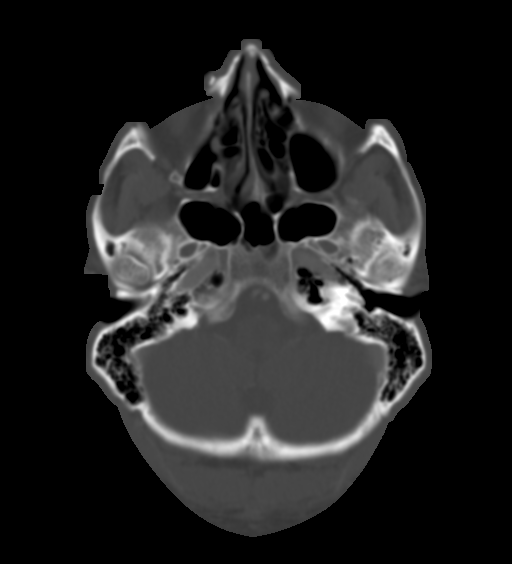
[im 11/32  bone]
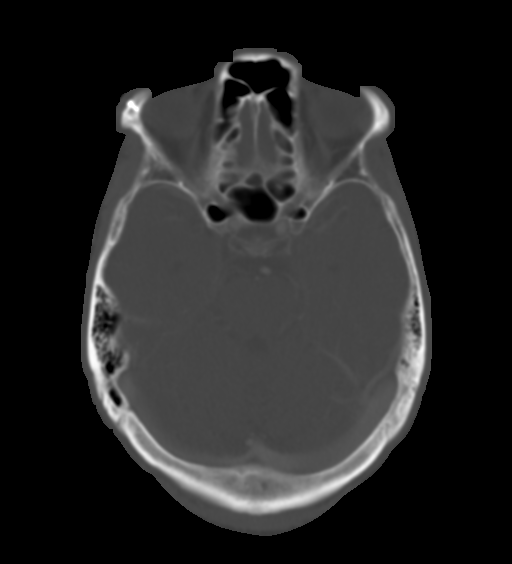

[15 of 35 positions shown; findings below may reference images not displayed]

FINDINGS: Brain: Mildly advanced cerebral and cerebellar atrophy for age. No
acute intracranial hemorrhage. No acute large vessel territory
infarct. No mass lesion, midline shift or mass effect. No
hydrocephalus or extra-axial fluid collection.

Vascular: No hyperdense vessel prior to contrast administration.
Following contrast administration, normal intravascular enhancement
seen throughout the major intracranial vascular structures. No
abnormal or pathologic enhancement.

Skull: Scalp soft tissues and calvarium demonstrate no acute
finding.

Sinuses/Orbits: Globes and orbital soft tissues within normal
limits. Sequelae of prior ORIF partially visualized at the right
maxilla. Mild mucosal thickening noted within the ethmoidal air
cells and left maxillary sinus. Visualized paranasal sinuses are
otherwise clear. Mastoid air cells and middle ear cavities are well
pneumatized and free of fluid.

Other: None.
IMPRESSION: 1. No acute intracranial abnormality.  No abnormal enhancement.
2. Mildly advanced cerebral and cerebellar atrophy for age.

## 2023-05-16 ENCOUNTER — Other Ambulatory Visit: Payer: Self-pay | Admitting: Infectious Diseases

## 2023-05-16 ENCOUNTER — Other Ambulatory Visit
Admission: RE | Admit: 2023-05-16 | Discharge: 2023-05-16 | Disposition: A | Discharge status: Home / Self Care | Payer: Medicaid Other | Source: Ambulatory Visit | Attending: Infectious Diseases | Admitting: Infectious Diseases

## 2023-05-16 ENCOUNTER — Other Ambulatory Visit: Payer: Self-pay

## 2023-05-16 DIAGNOSIS — B20 Human immunodeficiency virus [HIV] disease: Secondary | ICD-10-CM | POA: Insufficient documentation

## 2023-05-16 LAB — CBC AND DIFFERENTIAL
Baso # K/uL: 0 10*3/uL (ref 0.0–0.2)
Eos # K/uL: 0.1 10*3/uL (ref 0.0–0.5)
Hematocrit: 38 % (ref 37–52)
Hemoglobin: 12.3 g/dL (ref 12.0–17.0)
IMM Granulocytes #: 0 10*3/uL (ref 0.0–0.0)
IMM Granulocytes: 0.5 %
Lymph # K/uL: 0.8 10*3/uL — ABNORMAL LOW (ref 1.0–5.0)
MCV: 94 fL (ref 75–100)
Mono # K/uL: 0.7 10*3/uL (ref 0.1–1.0)
Neut # K/uL: 2.3 10*3/uL (ref 1.5–6.5)
Nucl RBC # K/uL: 0 10*3/uL (ref 0.0–0.0)
Nucl RBC %: 0 /100 WBC (ref 0.0–0.2)
Platelets: 272 10*3/uL (ref 150–450)
RBC: 4 MIL/uL (ref 4.0–6.0)
RDW: 11.9 % (ref 0.0–15.0)
Seg Neut %: 58.7 %
WBC: 3.9 10*3/uL (ref 3.5–11.0)

## 2023-05-16 LAB — COMPREHENSIVE METABOLIC PANEL
ALT: 20 U/L (ref 0–50)
AST: 39 U/L (ref 0–50)
Albumin: 3.7 g/dL (ref 3.5–5.2)
Alk Phos: 63 U/L (ref 40–130)
Anion Gap: 15 (ref 7–16)
Bilirubin,Total: 0.3 mg/dL (ref 0.0–1.2)
CO2: 22 mmol/L (ref 20–28)
Calcium: 8.9 mg/dL (ref 8.6–10.2)
Chloride: 103 mmol/L (ref 96–108)
Creatinine: 0.68 mg/dL (ref 0.67–1.17)
Glucose: 78 mg/dL (ref 60–99)
Lab: 4 mg/dL — ABNORMAL LOW (ref 6–20)
Potassium: 3.7 mmol/L (ref 3.3–5.1)
Sodium: 140 mmol/L (ref 133–145)
Total Protein: 7.8 g/dL — ABNORMAL HIGH (ref 6.3–7.7)
eGFR BY CREAT: 115 *

## 2023-05-16 MED ORDER — BIKTARVY 50-200-25 MG PO TABS
ORAL_TABLET | ORAL | 5 refills | Status: AC
Start: 2023-05-16 — End: ?
  Filled 2023-05-16: qty 30, 30d supply, fill #0

## 2023-05-16 MED ORDER — SULFAMETHOXAZOLE-TRIMETHOPRIM 800-160 MG PO TABS *I*
ORAL_TABLET | ORAL | 3 refills | Status: AC
Start: 2023-05-16 — End: ?
  Filled 2023-05-16: qty 30, 30d supply, fill #0

## 2023-05-16 MED ORDER — FLUCONAZOLE 100 MG PO TABS *I*
ORAL_TABLET | ORAL | 1 refills | Status: AC
Start: 2023-05-16 — End: ?
  Filled 2023-05-16: qty 15, 14d supply, fill #0

## 2023-05-17 LAB — LYMPHOCYTE SUBSET (T CELLS ONLY)
CD4#: 14 cells/uL — ABNORMAL LOW (ref 496–2186)
CD4%: 2 % — ABNORMAL LOW (ref 32–71)
CD4/CD8: 0 — ABNORMAL LOW (ref 0.7–3.0)
CD8#: 419 cells/uL (ref 177–1137)
T LYM #(CD3): 491 cells/uL — ABNORMAL LOW (ref 754–2810)
T LYM %(CD3): 79 % (ref 54–87)
T Suppress %(CD8): 68 % — ABNORMAL HIGH (ref 10–38)

## 2023-05-17 LAB — SYPHILIS SCREEN
Syphilis Screen: NEGATIVE
Syphilis Status: NONREACTIVE

## 2023-05-18 LAB — HEMOGLOBIN A1C: Hemoglobin A1C: 5.7 % — ABNORMAL HIGH

## 2023-05-20 LAB — HIV-1 QUANTITATIVE NAAT
HIV-1 PCR log: 5.56 copies/mL
HIV-1 PCR: 360000 copies/mL

## 2023-09-09 ENCOUNTER — Other Ambulatory Visit: Payer: Self-pay

## 2023-09-09 LAB — CBC AND DIFFERENTIAL
Baso # K/uL: 0 10*3/uL (ref 0.0–0.2)
Basophil %: 0 % (ref 0–3)
Eos # K/uL: 0 10*3/uL (ref 0.0–0.6)
Eosinophil %: 0 % (ref 0–5)
Hematocrit: 45 % (ref 40–52)
Hemoglobin: 15.1 g/dL (ref 13.0–18.0)
Lymph # K/uL: 0.7 10*3/uL — ABNORMAL LOW (ref 1.0–4.8)
Lymphocyte %: 14 % — ABNORMAL LOW (ref 15–45)
MCH: 31 pg (ref 26.0–34.0)
MCHC: 33.6 g/dL (ref 32.0–37.5)
MCV: 92 fL (ref 80–100)
Mono # K/uL: 0.6 10*3/uL (ref 0.1–1.0)
Monocyte %: 12 % (ref 0–15)
Neut # K/uL: 3.5 10*3/uL (ref 1.8–8.0)
Platelets: 182 10*3/uL (ref 150–450)
RBC: 4.87 10*6/uL (ref 4.40–6.20)
RDW: 11.9 % (ref 0.0–15.2)
Seg Neut %: 73 % (ref 45–75)
WBC: 4.9 10*3/uL (ref 4.0–11.0)

## 2023-09-09 LAB — UNMAPPED LAB RESULTS
Basophil # (HT): 0 10 3/uL (ref 0.0–0.2)
Basophil % (HT): 0 % (ref 0–3)
Eosinophil # (HT): 0 10 3/uL (ref 0.0–0.6)
Eosinophil % (HT): 0 % (ref 0–5)
Hematocrit (HT): 45 % (ref 40–52)
Hemoglobin (HGB) (HT): 15.1 g/dL (ref 13.0–18.0)
Lymphocyte # (HT): 0.7 10 3/uL — ABNORMAL LOW (ref 1.0–4.8)
Lymphocyte % (HT): 14 % — ABNORMAL LOW (ref 15–45)
MCHC (HT): 33.6 g/dL (ref 32.0–37.5)
MCV (HT): 92 fL (ref 80–100)
Mean Corpuscular Hemoglobin (MCH) (HT): 31 pg (ref 26.0–34.0)
Monocyte # (HT): 0.6 10 3/uL (ref 0.1–1.0)
Monocyte % (HT): 12 % (ref 0–15)
Neutrophil # (HT): 3.5 10 3/uL (ref 1.8–8.0)
Platelets (HT): 182 10 3/uL (ref 150–450)
RBC (HT): 4.87 10 6/uL (ref 4.40–6.20)
RDW (HT): 11.9 % (ref 0.0–15.2)
Seg Neut % (HT): 73 % (ref 45–75)
WBC (HT): 4.9 10 3/uL (ref 4.0–11.0)

## 2023-09-09 LAB — BASIC METABOLIC PANEL
Anion Gap: 7 mmol/L (ref 4–16)
CO2: 25 meq/L (ref 22–30)
Calcium: 9 mg/dL (ref 8.5–10.2)
Chloride: 105 mmol/L (ref 98–108)
Glucose: 148 mg/dL — ABNORMAL HIGH (ref 65–100)
Lab: 8 mg/dL (ref 8–20)
Sodium: 137 mmol/L (ref 135–145)

## 2023-09-10 ENCOUNTER — Other Ambulatory Visit: Payer: Self-pay | Admitting: Gastroenterology

## 2023-09-10 LAB — CBC
Hematocrit: 40 % (ref 40–52)
Hemoglobin: 13.5 g/dL (ref 13.0–18.0)
MCH: 31 pg (ref 26.0–34.0)
MCHC: 33.8 g/dL (ref 32.0–37.5)
MCV: 92 fL (ref 80–100)
Platelets: 186 10*3/uL (ref 150–450)
RBC: 4.36 10*6/uL — ABNORMAL LOW (ref 4.40–6.20)
RDW: 11.9 % (ref 0.0–15.2)
WBC: 4.2 10*3/uL (ref 4.0–11.0)

## 2023-09-10 LAB — UNMAPPED LAB RESULTS
Hematocrit (HT): 40 % (ref 40–52)
Hemoglobin (HGB) (HT): 13.5 g/dL (ref 13.0–18.0)
MCHC (HT): 33.8 g/dL (ref 32.0–37.5)
MCV (HT): 92 fL (ref 80–100)
Mean Corpuscular Hemoglobin (MCH) (HT): 31 pg (ref 26.0–34.0)
Platelets (HT): 186 10 3/uL (ref 150–450)
RBC (HT): 4.36 10 6/uL — ABNORMAL LOW (ref 4.40–6.20)
RDW (HT): 11.9 % (ref 0.0–15.2)
WBC (HT): 4.2 10 3/uL (ref 4.0–11.0)

## 2023-09-11 ENCOUNTER — Encounter: Payer: Self-pay | Admitting: Gastroenterology

## 2023-09-11 LAB — UNMAPPED LAB RESULTS
Basophil # (HT): 0 10 3/uL (ref 0.0–0.2)
Basophil % (HT): 0 % (ref 0–3)
Eosinophil # (HT): 0.1 10 3/uL (ref 0.0–0.6)
Eosinophil % (HT): 3 % (ref 0–5)
HBV Core Ab (HT): NEGATIVE
HBV S Ag (HT): NEGATIVE
Hematocrit (HT): 42 % (ref 40–52)
Hematocrit (HT): 42 % (ref 40–52)
Hemoglobin (HGB) (HT): 14.1 g/dL (ref 13.0–18.0)
Hemoglobin (HGB) (HT): 14.1 g/dL (ref 13.0–18.0)
Lymphocyte # (HT): 1.2 10 3/uL (ref 1.0–4.8)
Lymphocyte % (HT): 35 % (ref 15–45)
MCHC (HT): 33.6 g/dL (ref 32.0–37.5)
MCHC (HT): 33.6 g/dL (ref 32.0–37.5)
MCV (HT): 92 fL (ref 80–100)
MCV (HT): 92 fL (ref 80–100)
Mean Corpuscular Hemoglobin (MCH) (HT): 30.9 pg (ref 26.0–34.0)
Mean Corpuscular Hemoglobin (MCH) (HT): 30.9 pg (ref 26.0–34.0)
Monocyte # (HT): 0.4 10 3/uL (ref 0.1–1.0)
Monocyte % (HT): 12 % (ref 0–15)
Neutrophil # (HT): 1.8 10 3/uL (ref 1.8–8.0)
Platelets (HT): 191 10 3/uL (ref 150–450)
Platelets (HT): 191 10 3/uL (ref 150–450)
RBC (HT): 4.57 10 6/uL (ref 4.40–6.20)
RBC (HT): 4.57 10 6/uL (ref 4.40–6.20)
RDW (HT): 12.1 % (ref 0.0–15.2)
RDW (HT): 12.1 % (ref 0.0–15.2)
Seg Neut % (HT): 50 % (ref 45–75)
WBC (HT): 3.5 10 3/uL — ABNORMAL LOW (ref 4.0–11.0)
WBC (HT): 3.5 10 3/uL — ABNORMAL LOW (ref 4.0–11.0)

## 2023-09-11 LAB — TREPONEMA PALLIDUM (SYPHILIS) ANTIBODY (HT): Syphilis Treponemal Ab (HT): NONREACTIVE

## 2023-09-12 ENCOUNTER — Encounter: Payer: Self-pay | Admitting: Gastroenterology

## 2023-09-12 LAB — UNMAPPED LAB RESULTS
Hematocrit (HT): 41 % (ref 40–52)
Hemoglobin (HGB) (HT): 14.3 g/dL (ref 13.0–18.0)
MCHC (HT): 35 g/dL (ref 32.0–37.5)
MCV (HT): 92 fL (ref 80–100)
Mean Corpuscular Hemoglobin (MCH) (HT): 32.1 pg (ref 26.0–34.0)
Platelets (HT): 217 10 3/uL (ref 150–450)
RBC (HT): 4.46 10 6/uL (ref 4.40–6.20)
RDW (HT): 11.9 % (ref 0.0–15.2)
WBC (HT): 4.3 10 3/uL (ref 4.0–11.0)

## 2023-09-13 LAB — UNMAPPED LAB RESULTS
Hematocrit (HT): 42 % (ref 40–52)
Hemoglobin (HGB) (HT): 14.3 g/dL (ref 13.0–18.0)
MCHC (HT): 33.7 g/dL (ref 32.0–37.5)
MCV (HT): 92 fL (ref 80–100)
Mean Corpuscular Hemoglobin (MCH) (HT): 30.9 pg (ref 26.0–34.0)
Platelets (HT): 270 10 3/uL (ref 150–450)
RBC (HT): 4.63 10 6/uL (ref 4.40–6.20)
RDW (HT): 11.9 % (ref 0.0–15.2)
WBC (HT): 4 10 3/uL (ref 4.0–11.0)

## 2023-09-29 ENCOUNTER — Telehealth: Payer: Self-pay | Admitting: Infectious Diseases

## 2023-09-29 NOTE — Telephone Encounter (Signed)
Dialed Number listed to schedule patient unable to reach at this time no voicemail is set up.

## 2023-12-25 LAB — UNMAPPED LAB RESULTS
Basophil # (HT): 0 10 3/uL (ref 0.0–0.2)
Basophil % (HT): 0 % (ref 0–3)
Eosinophil # (HT): 0.1 10 3/uL (ref 0.0–0.6)
Eosinophil % (HT): 4 % (ref 0–5)
Hematocrit (HT): 39 % — ABNORMAL LOW (ref 40–52)
Hemoglobin (HGB) (HT): 12.5 g/dL — ABNORMAL LOW (ref 13.0–18.0)
Lymphocyte # (HT): 0.9 10 3/uL — ABNORMAL LOW (ref 1.0–4.8)
Lymphocyte % (HT): 29 % (ref 15–45)
MCHC (HT): 32.2 g/dL (ref 32.0–37.5)
MCV (HT): 94 fL (ref 80–100)
Mean Corpuscular Hemoglobin (MCH) (HT): 30.3 pg (ref 26.0–34.0)
Monocyte # (HT): 0.4 10 3/uL (ref 0.1–1.0)
Monocyte % (HT): 11 % (ref 0–15)
Neutrophil # (HT): 1.8 10 3/uL (ref 1.8–8.0)
Platelets (HT): 156 10 3/uL (ref 150–450)
RBC (HT): 4.13 10 6/uL — ABNORMAL LOW (ref 4.40–6.20)
RDW (HT): 13.8 % (ref 0.0–15.2)
Seg Neut % (HT): 56 % (ref 45–75)
WBC (HT): 3.2 10 3/uL — ABNORMAL LOW (ref 4.0–11.0)

## 2023-12-25 LAB — TREPONEMA PALLIDUM (SYPHILIS) ANTIBODY (HT): Syphilis Treponemal Ab (HT): NONREACTIVE

## 2024-04-30 ENCOUNTER — Telehealth: Payer: Self-pay

## 2024-04-30 NOTE — Telephone Encounter (Signed)
Referral generated and routed to [facility] for review.

## 2024-04-30 NOTE — Telephone Encounter (Signed)
 Copied from CRM #[id number]. Topic: Appointments - Schedule Appointment>> Apr 30, 2024 12:33 PM Thersia ORN wrote:[facility] - Telephone Request for ServicesReferral Source:  Who is the caller? PatientContact information if different from the patient: n/aPatient Information:Patient's Name:  Derek Walker Patient's Date of Birth: 03-31-1976 Preferred Phone:  [phone] Address: [address] [place] [address] County: [place] [[id number]]Demographics Verified/Updated?Jorja messages regarding your mental health services be left at this number?YesPreferred Language: English Did someone interpret for the patient during the call? No.Would you need an interpreter for your visits? NoInsurance Information:Insurance to be billed for services: [place] MedicaidPolicy #: [id number] Will care be covered by elective self-pay, WC, MVA, VA Benefits, or any other 3rd party coverage?NODo you have current mental health providers: NoneWhat SERVICE(s) are you hoping to be connected to?Individual PsychotherapyWhat SYMPTOMS or concerns are you experiencing that prompted you to seek care?    (Provide a brief description of the reason the patient/referring provider is seeking treatment. Use patients own words where possible.)Trauma, depression, paranoid, not the person use to beDo you currently feel safe from harming yourself or others? Yes  .

## 2024-05-03 ENCOUNTER — Telehealth: Payer: Self-pay

## 2024-05-03 NOTE — Telephone Encounter (Cosign Needed Addendum)
 0830 - [facility] attempted to contact pt regarding self referral submitted on 8/15 but preferred phone number that was provided [phone] is not currently in service. This writer attempted to contact pt via eRecord listed number but phone went to vm and vm not set up [phone]. This Clinical research associate was attempting to offer pt the option to be added to [facility] intake scheduling wait list. 1400 - This writer attempted second outreach contact but preferred phone number that was provided [phone] is still not currently in service. This writer attempted to contact pt via eRecord number but phone went to vm again and vm not set up [phone].

## 2024-05-04 NOTE — Progress Notes (Signed)
 [facility] attempted third outreach contact but preferred phone number that was provided [phone] is still not currently in service. This writer attempted to contact pt via eRecord number but phone went to vm again and vm still not set up [phone]. Wait list outreach letter was mailed to pt's listed address on 8/19 regarding being placed on [facility] intake scheduling wait list. PT advised to contact [facility] by Friday September 5th in order to be placed on wait list.

## 2024-06-25 ENCOUNTER — Encounter: Payer: Self-pay | Admitting: Internal Medicine
